# Patient Record
Sex: Male | Born: 1970
Health system: Southern US, Community
[De-identification: ages and names within clinical notes are randomized; demographics above are authoritative.]

## PROBLEM LIST (undated history)

## (undated) DIAGNOSIS — F32A Depression, unspecified: Secondary | ICD-10-CM

## (undated) DIAGNOSIS — A6000 Herpesviral infection of urogenital system, unspecified: Secondary | ICD-10-CM

## (undated) DIAGNOSIS — C4490 Unspecified malignant neoplasm of skin, unspecified: Secondary | ICD-10-CM

## (undated) HISTORY — DX: Depression, unspecified: F32.A

## (undated) HISTORY — DX: Herpesviral infection of urogenital system, unspecified: A60.00

## (undated) HISTORY — DX: Unspecified malignant neoplasm of skin, unspecified: C44.90

## (undated) MED FILL — Nivolumab IV Soln 240 MG/24ML: INTRAVENOUS | Qty: 48 | Status: AC

---

## 2001-07-07 DIAGNOSIS — Z87828 Personal history of other (healed) physical injury and trauma: Secondary | ICD-10-CM

## 2001-07-07 HISTORY — PX: SPINAL FUSION W/ LUQUE UNIT ROD: SHX2426

## 2001-07-07 HISTORY — DX: Personal history of other (healed) physical injury and trauma: Z87.828

## 2001-12-12 ENCOUNTER — Inpatient Hospital Stay (HOSPITAL_COMMUNITY): Admission: AD | Admit: 2001-12-12 | Discharge: 2001-12-18 | Payer: Self-pay

## 2001-12-13 ENCOUNTER — Encounter: Payer: Self-pay | Admitting: Surgery

## 2001-12-14 ENCOUNTER — Encounter: Payer: Self-pay | Admitting: Neurosurgery

## 2003-08-28 ENCOUNTER — Encounter: Admission: RE | Admit: 2003-08-28 | Discharge: 2003-08-28 | Payer: Self-pay | Admitting: Orthopaedic Surgery

## 2004-04-10 ENCOUNTER — Ambulatory Visit: Payer: Self-pay | Admitting: Physician Assistant

## 2004-04-24 ENCOUNTER — Ambulatory Visit: Payer: Self-pay | Admitting: Pain Medicine

## 2004-06-04 ENCOUNTER — Ambulatory Visit: Payer: Self-pay | Admitting: Physician Assistant

## 2004-06-27 ENCOUNTER — Ambulatory Visit: Payer: Self-pay | Admitting: Physician Assistant

## 2011-07-24 DIAGNOSIS — A6000 Herpesviral infection of urogenital system, unspecified: Secondary | ICD-10-CM | POA: Diagnosis not present

## 2011-07-24 DIAGNOSIS — M545 Low back pain: Secondary | ICD-10-CM | POA: Diagnosis not present

## 2011-07-24 DIAGNOSIS — Z6825 Body mass index (BMI) 25.0-25.9, adult: Secondary | ICD-10-CM | POA: Diagnosis not present

## 2011-07-24 DIAGNOSIS — Z79899 Other long term (current) drug therapy: Secondary | ICD-10-CM | POA: Diagnosis not present

## 2011-10-21 DIAGNOSIS — M545 Low back pain: Secondary | ICD-10-CM | POA: Diagnosis not present

## 2011-10-21 DIAGNOSIS — Z79899 Other long term (current) drug therapy: Secondary | ICD-10-CM | POA: Diagnosis not present

## 2011-10-21 DIAGNOSIS — D500 Iron deficiency anemia secondary to blood loss (chronic): Secondary | ICD-10-CM | POA: Diagnosis not present

## 2011-10-21 DIAGNOSIS — F489 Nonpsychotic mental disorder, unspecified: Secondary | ICD-10-CM | POA: Diagnosis not present

## 2011-10-21 DIAGNOSIS — E291 Testicular hypofunction: Secondary | ICD-10-CM | POA: Diagnosis not present

## 2012-01-15 DIAGNOSIS — A6000 Herpesviral infection of urogenital system, unspecified: Secondary | ICD-10-CM | POA: Diagnosis not present

## 2012-01-15 DIAGNOSIS — E291 Testicular hypofunction: Secondary | ICD-10-CM | POA: Diagnosis not present

## 2012-01-15 DIAGNOSIS — M545 Low back pain: Secondary | ICD-10-CM | POA: Diagnosis not present

## 2012-04-08 DIAGNOSIS — Z6825 Body mass index (BMI) 25.0-25.9, adult: Secondary | ICD-10-CM | POA: Diagnosis not present

## 2012-04-08 DIAGNOSIS — Z125 Encounter for screening for malignant neoplasm of prostate: Secondary | ICD-10-CM | POA: Diagnosis not present

## 2012-04-08 DIAGNOSIS — M545 Low back pain: Secondary | ICD-10-CM | POA: Diagnosis not present

## 2012-04-08 DIAGNOSIS — N529 Male erectile dysfunction, unspecified: Secondary | ICD-10-CM | POA: Diagnosis not present

## 2012-04-08 DIAGNOSIS — E291 Testicular hypofunction: Secondary | ICD-10-CM | POA: Diagnosis not present

## 2012-04-08 DIAGNOSIS — Z79899 Other long term (current) drug therapy: Secondary | ICD-10-CM | POA: Diagnosis not present

## 2012-05-13 DIAGNOSIS — E291 Testicular hypofunction: Secondary | ICD-10-CM | POA: Diagnosis not present

## 2012-07-14 DIAGNOSIS — M545 Low back pain: Secondary | ICD-10-CM | POA: Diagnosis not present

## 2012-07-14 DIAGNOSIS — J189 Pneumonia, unspecified organism: Secondary | ICD-10-CM | POA: Diagnosis not present

## 2012-07-14 DIAGNOSIS — B9789 Other viral agents as the cause of diseases classified elsewhere: Secondary | ICD-10-CM | POA: Diagnosis not present

## 2012-10-11 DIAGNOSIS — K649 Unspecified hemorrhoids: Secondary | ICD-10-CM | POA: Diagnosis not present

## 2012-10-11 DIAGNOSIS — M545 Low back pain: Secondary | ICD-10-CM | POA: Diagnosis not present

## 2012-12-30 DIAGNOSIS — IMO0002 Reserved for concepts with insufficient information to code with codable children: Secondary | ICD-10-CM | POA: Diagnosis not present

## 2012-12-30 DIAGNOSIS — M545 Low back pain: Secondary | ICD-10-CM | POA: Diagnosis not present

## 2012-12-30 DIAGNOSIS — F411 Generalized anxiety disorder: Secondary | ICD-10-CM | POA: Diagnosis not present

## 2013-04-07 DIAGNOSIS — A6000 Herpesviral infection of urogenital system, unspecified: Secondary | ICD-10-CM | POA: Diagnosis not present

## 2013-04-07 DIAGNOSIS — F411 Generalized anxiety disorder: Secondary | ICD-10-CM | POA: Diagnosis not present

## 2013-04-07 DIAGNOSIS — M545 Low back pain: Secondary | ICD-10-CM | POA: Diagnosis not present

## 2013-07-05 DIAGNOSIS — M545 Low back pain: Secondary | ICD-10-CM | POA: Diagnosis not present

## 2013-07-05 DIAGNOSIS — Z79899 Other long term (current) drug therapy: Secondary | ICD-10-CM | POA: Diagnosis not present

## 2013-10-04 DIAGNOSIS — M545 Low back pain, unspecified: Secondary | ICD-10-CM | POA: Diagnosis not present

## 2013-10-04 DIAGNOSIS — F411 Generalized anxiety disorder: Secondary | ICD-10-CM | POA: Diagnosis not present

## 2013-12-29 DIAGNOSIS — M545 Low back pain, unspecified: Secondary | ICD-10-CM | POA: Diagnosis not present

## 2013-12-29 DIAGNOSIS — Z0000 Encounter for general adult medical examination without abnormal findings: Secondary | ICD-10-CM | POA: Diagnosis not present

## 2013-12-29 DIAGNOSIS — Z79899 Other long term (current) drug therapy: Secondary | ICD-10-CM | POA: Diagnosis not present

## 2013-12-29 DIAGNOSIS — H539 Unspecified visual disturbance: Secondary | ICD-10-CM | POA: Diagnosis not present

## 2013-12-29 DIAGNOSIS — Z125 Encounter for screening for malignant neoplasm of prostate: Secondary | ICD-10-CM | POA: Diagnosis not present

## 2013-12-29 DIAGNOSIS — G43909 Migraine, unspecified, not intractable, without status migrainosus: Secondary | ICD-10-CM | POA: Diagnosis not present

## 2014-03-30 DIAGNOSIS — F411 Generalized anxiety disorder: Secondary | ICD-10-CM | POA: Diagnosis not present

## 2014-03-30 DIAGNOSIS — A6000 Herpesviral infection of urogenital system, unspecified: Secondary | ICD-10-CM | POA: Diagnosis not present

## 2014-03-30 DIAGNOSIS — M545 Low back pain, unspecified: Secondary | ICD-10-CM | POA: Diagnosis not present

## 2014-03-30 DIAGNOSIS — G43909 Migraine, unspecified, not intractable, without status migrainosus: Secondary | ICD-10-CM | POA: Diagnosis not present

## 2014-03-30 DIAGNOSIS — H547 Unspecified visual loss: Secondary | ICD-10-CM | POA: Diagnosis not present

## 2014-03-30 DIAGNOSIS — IMO0002 Reserved for concepts with insufficient information to code with codable children: Secondary | ICD-10-CM | POA: Diagnosis not present

## 2014-06-27 DIAGNOSIS — G43909 Migraine, unspecified, not intractable, without status migrainosus: Secondary | ICD-10-CM | POA: Diagnosis not present

## 2014-06-27 DIAGNOSIS — F419 Anxiety disorder, unspecified: Secondary | ICD-10-CM | POA: Diagnosis not present

## 2014-06-27 DIAGNOSIS — Z79899 Other long term (current) drug therapy: Secondary | ICD-10-CM | POA: Diagnosis not present

## 2014-06-27 DIAGNOSIS — M545 Low back pain: Secondary | ICD-10-CM | POA: Diagnosis not present

## 2014-09-26 DIAGNOSIS — Z6825 Body mass index (BMI) 25.0-25.9, adult: Secondary | ICD-10-CM | POA: Diagnosis not present

## 2014-09-26 DIAGNOSIS — F419 Anxiety disorder, unspecified: Secondary | ICD-10-CM | POA: Diagnosis not present

## 2014-09-26 DIAGNOSIS — G43909 Migraine, unspecified, not intractable, without status migrainosus: Secondary | ICD-10-CM | POA: Diagnosis not present

## 2014-09-26 DIAGNOSIS — K210 Gastro-esophageal reflux disease with esophagitis: Secondary | ICD-10-CM | POA: Diagnosis not present

## 2014-09-26 DIAGNOSIS — M545 Low back pain: Secondary | ICD-10-CM | POA: Diagnosis not present

## 2014-12-27 DIAGNOSIS — A6000 Herpesviral infection of urogenital system, unspecified: Secondary | ICD-10-CM | POA: Diagnosis not present

## 2014-12-27 DIAGNOSIS — Z79899 Other long term (current) drug therapy: Secondary | ICD-10-CM | POA: Diagnosis not present

## 2014-12-27 DIAGNOSIS — M545 Low back pain: Secondary | ICD-10-CM | POA: Diagnosis not present

## 2014-12-27 DIAGNOSIS — G43909 Migraine, unspecified, not intractable, without status migrainosus: Secondary | ICD-10-CM | POA: Diagnosis not present

## 2014-12-27 DIAGNOSIS — G4700 Insomnia, unspecified: Secondary | ICD-10-CM | POA: Diagnosis not present

## 2015-03-23 DIAGNOSIS — G43909 Migraine, unspecified, not intractable, without status migrainosus: Secondary | ICD-10-CM | POA: Diagnosis not present

## 2015-03-23 DIAGNOSIS — G4700 Insomnia, unspecified: Secondary | ICD-10-CM | POA: Diagnosis not present

## 2015-03-23 DIAGNOSIS — Z719 Counseling, unspecified: Secondary | ICD-10-CM | POA: Diagnosis not present

## 2015-03-23 DIAGNOSIS — M545 Low back pain: Secondary | ICD-10-CM | POA: Diagnosis not present

## 2015-06-22 DIAGNOSIS — Z6824 Body mass index (BMI) 24.0-24.9, adult: Secondary | ICD-10-CM | POA: Diagnosis not present

## 2015-06-22 DIAGNOSIS — M545 Low back pain: Secondary | ICD-10-CM | POA: Diagnosis not present

## 2015-06-22 DIAGNOSIS — Z79899 Other long term (current) drug therapy: Secondary | ICD-10-CM | POA: Diagnosis not present

## 2015-06-22 DIAGNOSIS — Z125 Encounter for screening for malignant neoplasm of prostate: Secondary | ICD-10-CM | POA: Diagnosis not present

## 2015-09-19 DIAGNOSIS — M545 Low back pain: Secondary | ICD-10-CM | POA: Diagnosis not present

## 2015-12-17 DIAGNOSIS — A6000 Herpesviral infection of urogenital system, unspecified: Secondary | ICD-10-CM | POA: Diagnosis not present

## 2015-12-17 DIAGNOSIS — Z79899 Other long term (current) drug therapy: Secondary | ICD-10-CM | POA: Diagnosis not present

## 2015-12-17 DIAGNOSIS — G43909 Migraine, unspecified, not intractable, without status migrainosus: Secondary | ICD-10-CM | POA: Diagnosis not present

## 2015-12-17 DIAGNOSIS — K210 Gastro-esophageal reflux disease with esophagitis: Secondary | ICD-10-CM | POA: Diagnosis not present

## 2015-12-17 DIAGNOSIS — M545 Low back pain: Secondary | ICD-10-CM | POA: Diagnosis not present

## 2015-12-17 DIAGNOSIS — R739 Hyperglycemia, unspecified: Secondary | ICD-10-CM | POA: Diagnosis not present

## 2015-12-17 DIAGNOSIS — Z6823 Body mass index (BMI) 23.0-23.9, adult: Secondary | ICD-10-CM | POA: Diagnosis not present

## 2016-03-14 DIAGNOSIS — G43909 Migraine, unspecified, not intractable, without status migrainosus: Secondary | ICD-10-CM | POA: Diagnosis not present

## 2016-03-14 DIAGNOSIS — A6000 Herpesviral infection of urogenital system, unspecified: Secondary | ICD-10-CM | POA: Diagnosis not present

## 2016-03-14 DIAGNOSIS — Z1389 Encounter for screening for other disorder: Secondary | ICD-10-CM | POA: Diagnosis not present

## 2016-03-14 DIAGNOSIS — M545 Low back pain: Secondary | ICD-10-CM | POA: Diagnosis not present

## 2016-06-17 DIAGNOSIS — A6002 Herpesviral infection of other male genital organs: Secondary | ICD-10-CM | POA: Diagnosis not present

## 2016-06-17 DIAGNOSIS — E663 Overweight: Secondary | ICD-10-CM | POA: Diagnosis not present

## 2016-06-17 DIAGNOSIS — M545 Low back pain: Secondary | ICD-10-CM | POA: Diagnosis not present

## 2016-06-17 DIAGNOSIS — Z79891 Long term (current) use of opiate analgesic: Secondary | ICD-10-CM | POA: Diagnosis not present

## 2016-06-17 DIAGNOSIS — Z6825 Body mass index (BMI) 25.0-25.9, adult: Secondary | ICD-10-CM | POA: Diagnosis not present

## 2016-09-15 DIAGNOSIS — K649 Unspecified hemorrhoids: Secondary | ICD-10-CM | POA: Diagnosis not present

## 2016-09-15 DIAGNOSIS — L989 Disorder of the skin and subcutaneous tissue, unspecified: Secondary | ICD-10-CM | POA: Diagnosis not present

## 2016-09-15 DIAGNOSIS — G43909 Migraine, unspecified, not intractable, without status migrainosus: Secondary | ICD-10-CM | POA: Diagnosis not present

## 2016-09-15 DIAGNOSIS — M545 Low back pain: Secondary | ICD-10-CM | POA: Diagnosis not present

## 2016-09-15 DIAGNOSIS — A6002 Herpesviral infection of other male genital organs: Secondary | ICD-10-CM | POA: Diagnosis not present

## 2016-09-15 DIAGNOSIS — Z6824 Body mass index (BMI) 24.0-24.9, adult: Secondary | ICD-10-CM | POA: Diagnosis not present

## 2016-12-15 DIAGNOSIS — M545 Low back pain: Secondary | ICD-10-CM | POA: Diagnosis not present

## 2016-12-15 DIAGNOSIS — G43909 Migraine, unspecified, not intractable, without status migrainosus: Secondary | ICD-10-CM | POA: Diagnosis not present

## 2016-12-15 DIAGNOSIS — A6002 Herpesviral infection of other male genital organs: Secondary | ICD-10-CM | POA: Diagnosis not present

## 2016-12-15 DIAGNOSIS — Z6825 Body mass index (BMI) 25.0-25.9, adult: Secondary | ICD-10-CM | POA: Diagnosis not present

## 2017-03-16 DIAGNOSIS — F329 Major depressive disorder, single episode, unspecified: Secondary | ICD-10-CM | POA: Diagnosis not present

## 2017-03-16 DIAGNOSIS — Z79899 Other long term (current) drug therapy: Secondary | ICD-10-CM | POA: Diagnosis not present

## 2017-03-16 DIAGNOSIS — Z79891 Long term (current) use of opiate analgesic: Secondary | ICD-10-CM | POA: Diagnosis not present

## 2017-03-16 DIAGNOSIS — Z9181 History of falling: Secondary | ICD-10-CM | POA: Diagnosis not present

## 2017-03-16 DIAGNOSIS — M818 Other osteoporosis without current pathological fracture: Secondary | ICD-10-CM | POA: Diagnosis not present

## 2017-03-16 DIAGNOSIS — E663 Overweight: Secondary | ICD-10-CM | POA: Diagnosis not present

## 2017-03-16 DIAGNOSIS — Z1389 Encounter for screening for other disorder: Secondary | ICD-10-CM | POA: Diagnosis not present

## 2017-03-16 DIAGNOSIS — Z125 Encounter for screening for malignant neoplasm of prostate: Secondary | ICD-10-CM | POA: Diagnosis not present

## 2017-03-16 DIAGNOSIS — M545 Low back pain: Secondary | ICD-10-CM | POA: Diagnosis not present

## 2017-03-16 DIAGNOSIS — Z6825 Body mass index (BMI) 25.0-25.9, adult: Secondary | ICD-10-CM | POA: Diagnosis not present

## 2017-04-15 DIAGNOSIS — H60391 Other infective otitis externa, right ear: Secondary | ICD-10-CM | POA: Diagnosis not present

## 2017-04-15 DIAGNOSIS — F329 Major depressive disorder, single episode, unspecified: Secondary | ICD-10-CM | POA: Diagnosis not present

## 2017-04-15 DIAGNOSIS — Z6825 Body mass index (BMI) 25.0-25.9, adult: Secondary | ICD-10-CM | POA: Diagnosis not present

## 2017-06-12 DIAGNOSIS — M545 Low back pain: Secondary | ICD-10-CM | POA: Diagnosis not present

## 2017-06-12 DIAGNOSIS — Z6825 Body mass index (BMI) 25.0-25.9, adult: Secondary | ICD-10-CM | POA: Diagnosis not present

## 2017-09-10 DIAGNOSIS — A6002 Herpesviral infection of other male genital organs: Secondary | ICD-10-CM | POA: Diagnosis not present

## 2017-09-10 DIAGNOSIS — G43909 Migraine, unspecified, not intractable, without status migrainosus: Secondary | ICD-10-CM | POA: Diagnosis not present

## 2017-09-10 DIAGNOSIS — Z6824 Body mass index (BMI) 24.0-24.9, adult: Secondary | ICD-10-CM | POA: Diagnosis not present

## 2017-09-10 DIAGNOSIS — M545 Low back pain: Secondary | ICD-10-CM | POA: Diagnosis not present

## 2017-12-10 DIAGNOSIS — G43909 Migraine, unspecified, not intractable, without status migrainosus: Secondary | ICD-10-CM | POA: Diagnosis not present

## 2017-12-10 DIAGNOSIS — E559 Vitamin D deficiency, unspecified: Secondary | ICD-10-CM | POA: Diagnosis not present

## 2017-12-10 DIAGNOSIS — Z6822 Body mass index (BMI) 22.0-22.9, adult: Secondary | ICD-10-CM | POA: Diagnosis not present

## 2017-12-10 DIAGNOSIS — M545 Low back pain: Secondary | ICD-10-CM | POA: Diagnosis not present

## 2018-01-28 DIAGNOSIS — H2513 Age-related nuclear cataract, bilateral: Secondary | ICD-10-CM | POA: Diagnosis not present

## 2018-03-11 DIAGNOSIS — Z79899 Other long term (current) drug therapy: Secondary | ICD-10-CM | POA: Diagnosis not present

## 2018-03-11 DIAGNOSIS — N62 Hypertrophy of breast: Secondary | ICD-10-CM | POA: Diagnosis not present

## 2018-03-11 DIAGNOSIS — E559 Vitamin D deficiency, unspecified: Secondary | ICD-10-CM | POA: Diagnosis not present

## 2018-03-11 DIAGNOSIS — N6320 Unspecified lump in the left breast, unspecified quadrant: Secondary | ICD-10-CM | POA: Diagnosis not present

## 2018-03-11 DIAGNOSIS — G43909 Migraine, unspecified, not intractable, without status migrainosus: Secondary | ICD-10-CM | POA: Diagnosis not present

## 2018-03-18 DIAGNOSIS — N62 Hypertrophy of breast: Secondary | ICD-10-CM | POA: Diagnosis not present

## 2018-03-18 DIAGNOSIS — R928 Other abnormal and inconclusive findings on diagnostic imaging of breast: Secondary | ICD-10-CM | POA: Diagnosis not present

## 2018-03-24 DIAGNOSIS — N62 Hypertrophy of breast: Secondary | ICD-10-CM | POA: Diagnosis not present

## 2018-03-24 DIAGNOSIS — E349 Endocrine disorder, unspecified: Secondary | ICD-10-CM | POA: Diagnosis not present

## 2018-03-24 DIAGNOSIS — Z23 Encounter for immunization: Secondary | ICD-10-CM | POA: Diagnosis not present

## 2018-03-24 DIAGNOSIS — Z125 Encounter for screening for malignant neoplasm of prostate: Secondary | ICD-10-CM | POA: Diagnosis not present

## 2018-06-23 DIAGNOSIS — M545 Low back pain: Secondary | ICD-10-CM | POA: Diagnosis not present

## 2018-06-23 DIAGNOSIS — E559 Vitamin D deficiency, unspecified: Secondary | ICD-10-CM | POA: Diagnosis not present

## 2018-06-23 DIAGNOSIS — G4700 Insomnia, unspecified: Secondary | ICD-10-CM | POA: Diagnosis not present

## 2018-06-23 DIAGNOSIS — A6002 Herpesviral infection of other male genital organs: Secondary | ICD-10-CM | POA: Diagnosis not present

## 2019-07-08 HISTORY — PX: MELANOMA EXCISION WITH SENTINEL LYMPH NODE BIOPSY: SHX5267

## 2019-07-29 DIAGNOSIS — K649 Unspecified hemorrhoids: Secondary | ICD-10-CM | POA: Diagnosis not present

## 2019-07-29 DIAGNOSIS — G4700 Insomnia, unspecified: Secondary | ICD-10-CM | POA: Diagnosis not present

## 2019-07-29 DIAGNOSIS — A6002 Herpesviral infection of other male genital organs: Secondary | ICD-10-CM | POA: Diagnosis not present

## 2019-07-29 DIAGNOSIS — E559 Vitamin D deficiency, unspecified: Secondary | ICD-10-CM | POA: Diagnosis not present

## 2019-11-16 DIAGNOSIS — Z6822 Body mass index (BMI) 22.0-22.9, adult: Secondary | ICD-10-CM | POA: Diagnosis not present

## 2019-11-16 DIAGNOSIS — L989 Disorder of the skin and subcutaneous tissue, unspecified: Secondary | ICD-10-CM | POA: Diagnosis not present

## 2019-11-16 DIAGNOSIS — M7591 Shoulder lesion, unspecified, right shoulder: Secondary | ICD-10-CM | POA: Diagnosis not present

## 2019-12-01 DIAGNOSIS — L578 Other skin changes due to chronic exposure to nonionizing radiation: Secondary | ICD-10-CM | POA: Diagnosis not present

## 2019-12-01 DIAGNOSIS — C439 Malignant melanoma of skin, unspecified: Secondary | ICD-10-CM

## 2019-12-01 DIAGNOSIS — L820 Inflamed seborrheic keratosis: Secondary | ICD-10-CM | POA: Diagnosis not present

## 2019-12-01 DIAGNOSIS — C4361 Malignant melanoma of right upper limb, including shoulder: Secondary | ICD-10-CM | POA: Diagnosis not present

## 2019-12-01 DIAGNOSIS — C4359 Malignant melanoma of other part of trunk: Secondary | ICD-10-CM | POA: Diagnosis not present

## 2019-12-01 DIAGNOSIS — L821 Other seborrheic keratosis: Secondary | ICD-10-CM | POA: Diagnosis not present

## 2019-12-01 DIAGNOSIS — D225 Melanocytic nevi of trunk: Secondary | ICD-10-CM | POA: Diagnosis not present

## 2019-12-01 DIAGNOSIS — D485 Neoplasm of uncertain behavior of skin: Secondary | ICD-10-CM | POA: Diagnosis not present

## 2019-12-01 HISTORY — DX: Malignant melanoma of skin, unspecified: C43.9

## 2019-12-14 DIAGNOSIS — C4361 Malignant melanoma of right upper limb, including shoulder: Secondary | ICD-10-CM | POA: Diagnosis not present

## 2019-12-14 DIAGNOSIS — D225 Melanocytic nevi of trunk: Secondary | ICD-10-CM | POA: Diagnosis not present

## 2019-12-21 DIAGNOSIS — C4361 Malignant melanoma of right upper limb, including shoulder: Secondary | ICD-10-CM | POA: Diagnosis not present

## 2019-12-23 DIAGNOSIS — Z20822 Contact with and (suspected) exposure to covid-19: Secondary | ICD-10-CM | POA: Diagnosis not present

## 2019-12-23 DIAGNOSIS — C4361 Malignant melanoma of right upper limb, including shoulder: Secondary | ICD-10-CM | POA: Diagnosis not present

## 2019-12-23 DIAGNOSIS — C439 Malignant melanoma of skin, unspecified: Secondary | ICD-10-CM | POA: Diagnosis not present

## 2019-12-23 DIAGNOSIS — Z01812 Encounter for preprocedural laboratory examination: Secondary | ICD-10-CM | POA: Diagnosis not present

## 2019-12-28 DIAGNOSIS — J984 Other disorders of lung: Secondary | ICD-10-CM | POA: Diagnosis not present

## 2019-12-28 DIAGNOSIS — C4361 Malignant melanoma of right upper limb, including shoulder: Secondary | ICD-10-CM | POA: Diagnosis not present

## 2019-12-30 DIAGNOSIS — Z87891 Personal history of nicotine dependence: Secondary | ICD-10-CM | POA: Diagnosis not present

## 2019-12-30 DIAGNOSIS — R590 Localized enlarged lymph nodes: Secondary | ICD-10-CM | POA: Diagnosis not present

## 2019-12-30 DIAGNOSIS — Z79899 Other long term (current) drug therapy: Secondary | ICD-10-CM | POA: Diagnosis not present

## 2019-12-30 DIAGNOSIS — C4361 Malignant melanoma of right upper limb, including shoulder: Secondary | ICD-10-CM | POA: Diagnosis not present

## 2019-12-30 DIAGNOSIS — J302 Other seasonal allergic rhinitis: Secondary | ICD-10-CM | POA: Diagnosis not present

## 2019-12-30 DIAGNOSIS — D225 Melanocytic nevi of trunk: Secondary | ICD-10-CM | POA: Diagnosis not present

## 2019-12-30 DIAGNOSIS — L905 Scar conditions and fibrosis of skin: Secondary | ICD-10-CM | POA: Diagnosis not present

## 2019-12-30 DIAGNOSIS — C4359 Malignant melanoma of other part of trunk: Secondary | ICD-10-CM | POA: Diagnosis not present

## 2020-01-04 DIAGNOSIS — C4361 Malignant melanoma of right upper limb, including shoulder: Secondary | ICD-10-CM | POA: Diagnosis not present

## 2020-01-16 DIAGNOSIS — Z01812 Encounter for preprocedural laboratory examination: Secondary | ICD-10-CM | POA: Diagnosis not present

## 2020-01-16 DIAGNOSIS — Z20822 Contact with and (suspected) exposure to covid-19: Secondary | ICD-10-CM | POA: Diagnosis not present

## 2020-01-16 DIAGNOSIS — C4361 Malignant melanoma of right upper limb, including shoulder: Secondary | ICD-10-CM | POA: Diagnosis not present

## 2020-01-19 DIAGNOSIS — H2512 Age-related nuclear cataract, left eye: Secondary | ICD-10-CM | POA: Diagnosis not present

## 2020-01-20 DIAGNOSIS — Z87891 Personal history of nicotine dependence: Secondary | ICD-10-CM | POA: Diagnosis not present

## 2020-01-20 DIAGNOSIS — J302 Other seasonal allergic rhinitis: Secondary | ICD-10-CM | POA: Diagnosis not present

## 2020-01-20 DIAGNOSIS — Z87828 Personal history of other (healed) physical injury and trauma: Secondary | ICD-10-CM | POA: Diagnosis not present

## 2020-01-20 DIAGNOSIS — L988 Other specified disorders of the skin and subcutaneous tissue: Secondary | ICD-10-CM | POA: Diagnosis not present

## 2020-01-20 DIAGNOSIS — C4361 Malignant melanoma of right upper limb, including shoulder: Secondary | ICD-10-CM | POA: Diagnosis not present

## 2020-01-20 DIAGNOSIS — Z981 Arthrodesis status: Secondary | ICD-10-CM | POA: Diagnosis not present

## 2020-01-20 DIAGNOSIS — Z79899 Other long term (current) drug therapy: Secondary | ICD-10-CM | POA: Diagnosis not present

## 2020-02-13 DIAGNOSIS — H2512 Age-related nuclear cataract, left eye: Secondary | ICD-10-CM | POA: Diagnosis not present

## 2020-02-27 DIAGNOSIS — H2512 Age-related nuclear cataract, left eye: Secondary | ICD-10-CM | POA: Diagnosis not present

## 2020-03-19 DIAGNOSIS — C4361 Malignant melanoma of right upper limb, including shoulder: Secondary | ICD-10-CM | POA: Diagnosis not present

## 2020-04-03 DIAGNOSIS — L578 Other skin changes due to chronic exposure to nonionizing radiation: Secondary | ICD-10-CM | POA: Diagnosis not present

## 2020-04-03 DIAGNOSIS — D225 Melanocytic nevi of trunk: Secondary | ICD-10-CM | POA: Diagnosis not present

## 2020-04-03 DIAGNOSIS — Z85820 Personal history of malignant melanoma of skin: Secondary | ICD-10-CM | POA: Diagnosis not present

## 2020-04-03 DIAGNOSIS — D485 Neoplasm of uncertain behavior of skin: Secondary | ICD-10-CM | POA: Diagnosis not present

## 2020-04-20 DIAGNOSIS — D485 Neoplasm of uncertain behavior of skin: Secondary | ICD-10-CM | POA: Diagnosis not present
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
## 2020-05-30 ENCOUNTER — Other Ambulatory Visit: Payer: Self-pay | Admitting: Oncology

## 2020-05-30 ENCOUNTER — Inpatient Hospital Stay: Payer: Medicare Other | Attending: Oncology | Admitting: Hematology and Oncology

## 2020-05-30 ENCOUNTER — Encounter: Payer: Self-pay | Admitting: Oncology

## 2020-05-30 ENCOUNTER — Other Ambulatory Visit: Payer: Self-pay

## 2020-05-30 ENCOUNTER — Inpatient Hospital Stay (INDEPENDENT_AMBULATORY_CARE_PROVIDER_SITE_OTHER): Payer: Medicare Other | Admitting: Oncology

## 2020-05-30 VITALS — BP 130/85 | HR 94 | Temp 98.1°F | Resp 16 | Ht 68.5 in | Wt 160.3 lb

## 2020-05-30 DIAGNOSIS — C439 Malignant melanoma of skin, unspecified: Secondary | ICD-10-CM | POA: Diagnosis not present

## 2020-05-30 DIAGNOSIS — D239 Other benign neoplasm of skin, unspecified: Secondary | ICD-10-CM

## 2020-05-30 DIAGNOSIS — Z0001 Encounter for general adult medical examination with abnormal findings: Secondary | ICD-10-CM | POA: Diagnosis not present

## 2020-05-30 DIAGNOSIS — D649 Anemia, unspecified: Secondary | ICD-10-CM | POA: Diagnosis not present

## 2020-05-30 HISTORY — DX: Other benign neoplasm of skin, unspecified: D23.9

## 2020-05-30 LAB — CBC AND DIFFERENTIAL
HCT: 42 (ref 41–53)
Hemoglobin: 14.1 (ref 13.5–17.5)
Neutrophils Absolute: 3.15
Platelets: 330 (ref 150–399)
WBC: 5

## 2020-05-30 LAB — BASIC METABOLIC PANEL
BUN: 17 (ref 4–21)
CO2: 29 — AB (ref 13–22)
Chloride: 104 (ref 99–108)
Creatinine: 0.9 (ref 0.6–1.3)
Glucose: 105
Potassium: 4.4 (ref 3.4–5.3)
Sodium: 140 (ref 137–147)

## 2020-05-30 LAB — HEPATIC FUNCTION PANEL
ALT: 12 (ref 10–40)
AST: 26 (ref 14–40)
Alkaline Phosphatase: 64 (ref 25–125)
Bilirubin, Total: 0.4

## 2020-05-30 LAB — COMPREHENSIVE METABOLIC PANEL
Albumin: 4.5 (ref 3.5–5.0)
Calcium: 9.8 (ref 8.7–10.7)

## 2020-05-30 LAB — CBC: RBC: 4.31 (ref 3.87–5.11)

## 2020-06-04 ENCOUNTER — Telehealth: Payer: Self-pay

## 2020-06-04 NOTE — Telephone Encounter (Signed)
Referral sent to Dr. Carlos Levering office.

## 2020-06-04 NOTE — Telephone Encounter (Signed)
-----   Message from Derwood Kaplan, MD sent at 05/30/2020  8:02 PM EST ----- Regarding: referral Pls ref. Dr. Coralie Keens for suspicious area of melanoma scar right shoulder, he has seen before

## 2020-06-07 DIAGNOSIS — L910 Hypertrophic scar: Secondary | ICD-10-CM | POA: Diagnosis not present

## 2020-06-07 DIAGNOSIS — Z85828 Personal history of other malignant neoplasm of skin: Secondary | ICD-10-CM | POA: Diagnosis not present

## 2020-08-07 DIAGNOSIS — L820 Inflamed seborrheic keratosis: Secondary | ICD-10-CM | POA: Diagnosis not present

## 2020-08-07 DIAGNOSIS — D485 Neoplasm of uncertain behavior of skin: Secondary | ICD-10-CM | POA: Diagnosis not present

## 2020-08-07 DIAGNOSIS — Z85820 Personal history of malignant melanoma of skin: Secondary | ICD-10-CM | POA: Diagnosis not present

## 2020-08-07 DIAGNOSIS — D225 Melanocytic nevi of trunk: Secondary | ICD-10-CM | POA: Diagnosis not present

## 2020-12-06 DIAGNOSIS — Z85820 Personal history of malignant melanoma of skin: Secondary | ICD-10-CM | POA: Diagnosis not present

## 2020-12-06 DIAGNOSIS — L821 Other seborrheic keratosis: Secondary | ICD-10-CM | POA: Diagnosis not present

## 2020-12-06 DIAGNOSIS — D485 Neoplasm of uncertain behavior of skin: Secondary | ICD-10-CM | POA: Diagnosis not present

## 2020-12-06 DIAGNOSIS — L578 Other skin changes due to chronic exposure to nonionizing radiation: Secondary | ICD-10-CM | POA: Diagnosis not present

## 2020-12-06 DIAGNOSIS — L820 Inflamed seborrheic keratosis: Secondary | ICD-10-CM | POA: Diagnosis not present

## 2020-12-26 DIAGNOSIS — H2511 Age-related nuclear cataract, right eye: Secondary | ICD-10-CM | POA: Diagnosis not present

## 2020-12-26 DIAGNOSIS — H04123 Dry eye syndrome of bilateral lacrimal glands: Secondary | ICD-10-CM | POA: Diagnosis not present

## 2020-12-26 DIAGNOSIS — Z01818 Encounter for other preprocedural examination: Secondary | ICD-10-CM | POA: Diagnosis not present

## 2020-12-31 ENCOUNTER — Other Ambulatory Visit: Payer: Self-pay | Admitting: Hematology and Oncology

## 2020-12-31 ENCOUNTER — Inpatient Hospital Stay: Payer: Medicare Other

## 2020-12-31 ENCOUNTER — Inpatient Hospital Stay: Payer: Medicare Other | Attending: Hematology and Oncology | Admitting: Hematology and Oncology

## 2020-12-31 ENCOUNTER — Other Ambulatory Visit: Payer: Self-pay

## 2020-12-31 ENCOUNTER — Encounter: Payer: Self-pay | Admitting: Hematology and Oncology

## 2020-12-31 VITALS — BP 137/84 | HR 85 | Temp 98.5°F | Resp 16 | Ht 68.5 in | Wt 160.6 lb

## 2020-12-31 DIAGNOSIS — C439 Malignant melanoma of skin, unspecified: Secondary | ICD-10-CM

## 2020-12-31 DIAGNOSIS — Z85820 Personal history of malignant melanoma of skin: Secondary | ICD-10-CM | POA: Diagnosis not present

## 2020-12-31 LAB — CBC AND DIFFERENTIAL
HCT: 41 (ref 41–53)
Hemoglobin: 13.9 (ref 13.5–17.5)
Neutrophils Absolute: 4.01
Platelets: 296 (ref 150–399)
WBC: 5.9

## 2020-12-31 LAB — HEPATIC FUNCTION PANEL
ALT: 12 (ref 10–40)
AST: 23 (ref 14–40)
Alkaline Phosphatase: 67 (ref 25–125)
Bilirubin, Total: 0.2

## 2020-12-31 LAB — BASIC METABOLIC PANEL
BUN: 15 (ref 4–21)
CO2: 30 — AB (ref 13–22)
Chloride: 102 (ref 99–108)
Creatinine: 0.8 (ref 0.6–1.3)
Glucose: 96
Potassium: 4.2 (ref 3.4–5.3)
Sodium: 138 (ref 137–147)

## 2020-12-31 LAB — CBC: RBC: 4.3 (ref 3.87–5.11)

## 2020-12-31 LAB — COMPREHENSIVE METABOLIC PANEL
Albumin: 4.3 (ref 3.5–5.0)
Calcium: 9.3 (ref 8.7–10.7)

## 2020-12-31 NOTE — Progress Notes (Addendum)
[facility]  [address] [place],  [place]  [address] [phone]  Clinic Day:  12/31/2020  Referring physician: Penelope Coop, FNP   CHIEF COMPLAINT:  CC:  Stage IIB malignant melanoma of the right shoulder  Current Treatment:   Observation   HISTORY OF PRESENT ILLNESS:  Xavier Jones is a 50 y.o. male with stage IIB (T3b N0 M0) malignant melanoma of the right posterior shoulder diagnosed in May 2021.  We began seeing him in June.  He reported an enlarging nevus of the right shoulder for about a year.  It began to bleed, so he saw his dermatologist.  Biopsy revealed malignant melanoma, Breslow's depth 3.3 mm, Clark level IV, however, there was no overt attachment to the overlying epidermis, so a metastatic nodule could not be ruled out.  He had several other lesions removed at the time of biopsy.  Right lower lateral back shave revealed dysplastic compound nevus with severe atypia, limited margins free.  Right lower medial back shave revealed dysplastic nevus with moderate to severe atypia, peripheral margin involved.  Left mid buttocks shave revealed dysplastic compound nevus with moderate atypia, limited margins free.  MRI head and CT chest, abdomen and pelvis in June were negative for metastatic disease. He underwent wide local excision of the right shoulder melanoma with Dr. Coralie Keens in June.  Pathology revealed residual dermal malignant melanoma, but margins were clear.  3 sentinel lymph nodes were negative for metastasis (0/3).  However, there was a question as to whether this represents a metastatic lesion, rather than a primary, based on the presence of 2 nodules and no epidermal involvement.  He also had excision of the lower back lesion, which did not reveal malignancy. No adjuvant therapy was recommended. PET scan in September did not reveal any findings of hypermetabolic metastatic disease, there was low level activity felt to be postoperative  changes in the skin/subcutaneous tissue of the right posterior shoulder, as well as likely degenerative hypermetabolism of the proximal, posterior right femur.  At his visit in November, he had persistent bleeding of the right shoulder incision with fibrosis of the skin, so he was referred back to Dr. Coralie Keens for a biopsy.  This revealed hypertrophic type scar of the skin of the right shoulder.    INTERVAL HISTORY:  Xavier Jones is here today for repeat clinical assessment. He continues to follow with a dermatologist regularly and did have additional skin lesions removed earlier this year, which were not melanoma or pre cancer.  He denies concerning skin lesions  He states he has a small palpable node in the left upper neck for few weeks since having a toothache on that side.  He states the toothache has resolved. His only other complaint today is constant fatigue.  He does have difficulty sleeping, for which he is on amitriptyline. He denies fevers or chills. He denies pain. His appetite is good. His weight has been stable.  We had recommended chest x-ray prior to his visit today, but this was not done.  Review of his family history reveals a maternal aunt had breast cancer in her 34's and a maternal cousin had breast cancer in her 23's.  His maternal grandfather had stomach cancer in his 19s.  REVIEW OF SYSTEMS:  Review of Systems  Constitutional:  Positive for fatigue. Negative for appetite change, chills, fever and unexpected weight change.  HENT:   Negative for lump/mass, mouth sores and sore throat.   Respiratory:  Negative  for cough and shortness of breath.   Cardiovascular:  Negative for chest pain and leg swelling.  Gastrointestinal:  Negative for abdominal pain, constipation, diarrhea, nausea and vomiting.  Genitourinary:  Negative for difficulty urinating, dysuria, frequency and hematuria.   Musculoskeletal:  Negative for arthralgias, back pain and myalgias.  Skin:  Negative for itching, rash and  wound.  Neurological:  Negative for dizziness, extremity weakness, headaches, light-headedness and numbness.  Hematological:  Negative for adenopathy.  Psychiatric/Behavioral:  Positive for sleep disturbance (On medication). Negative for depression. The patient is not nervous/anxious.     VITALS:  Blood pressure 137/84, pulse 85, temperature 98.5 F (36.9 C), temperature source Oral, resp. rate 16, height 5' 8.5" (1.74 m), weight 160 lb 9.6 oz (72.8 kg), SpO2 100 %.  Wt Readings from Last 3 Encounters:  12/31/20 160 lb 9.6 oz (72.8 kg)  05/30/20 160 lb 4.8 oz (72.7 kg)    Body mass index is 24.06 kg/m.  Performance status (ECOG): 1 - Symptomatic but completely ambulatory  PHYSICAL EXAM:  Physical Exam Vitals and nursing note reviewed.  Constitutional:      General: He is not in acute distress.    Appearance: Normal appearance. He is normal weight.  HENT:     Head: Normocephalic and atraumatic.     Mouth/Throat:     Mouth: Mucous membranes are moist.     Pharynx: Oropharynx is clear. No oropharyngeal exudate or posterior oropharyngeal erythema.  Eyes:     General: No scleral icterus.    Extraocular Movements: Extraocular movements intact.     Conjunctiva/sclera: Conjunctivae normal.     Pupils: Pupils are equal, round, and reactive to light.  Cardiovascular:     Rate and Rhythm: Normal rate and regular rhythm.     Heart sounds: Normal heart sounds. No murmur heard.   No friction rub. No gallop.  Pulmonary:     Effort: Pulmonary effort is normal.     Breath sounds: Normal breath sounds. No wheezing, rhonchi or rales.  Chest:  Breasts:    Right: No axillary adenopathy or supraclavicular adenopathy.     Left: No axillary adenopathy or supraclavicular adenopathy.  Abdominal:     General: Bowel sounds are normal. There is no distension.     Palpations: Abdomen is soft. There is no hepatomegaly, splenomegaly or mass.     Tenderness: There is no abdominal tenderness.   Musculoskeletal:        General: Normal range of motion.     Cervical back: Normal range of motion and neck supple. No tenderness.     Right lower leg: No edema.     Left lower leg: No edema.  Lymphadenopathy:     Head:     Left side of head: Submental (approx 1cm, rubbery lymph node) adenopathy present.     Upper Body:     Right upper body: No supraclavicular or axillary adenopathy.     Left upper body: No supraclavicular or axillary adenopathy.     Lower Body: No right inguinal adenopathy. No left inguinal adenopathy.  Skin:    General: Skin is warm and dry.     Coloration: Skin is not jaundiced.     Findings: No rash.  Neurological:     Mental Status: He is alert and oriented to person, place, and time.     Cranial Nerves: No cranial nerve deficit.  Psychiatric:        Mood and Affect: Mood normal.  Behavior: Behavior normal.        Thought Content: Thought content normal.    LABS:   CBC Latest Ref Rng & Units 12/31/2020 05/30/2020  WBC - 5.9 5.0  Hemoglobin 13.5 - 17.5 13.9 14.1  Hematocrit 41 - 53 41 42  Platelets 150 - 399 296 330   CMP Latest Ref Rng & Units 12/31/2020 05/30/2020  BUN 4 - 21 15 17   Creatinine 0.6 - 1.3 0.8 0.9  Sodium 137 - 147 138 140  Potassium 3.4 - 5.3 4.2 4.4  Chloride 99 - 108 102 104  CO2 13 - 22 30(A) 29(A)  Calcium 8.7 - 10.7 9.3 9.8  Alkaline Phos 25 - 125 67 64  AST 14 - 40 23 26  ALT 10 - 40 12 12     No results found for: CEA1 / No results found for: CEA1 No results found for: PSA1 No results found for: INO676 No results found for: HMC947  No results found for: TOTALPROTELP, ALBUMINELP, A1GS, A2GS, BETS, BETA2SER, GAMS, MSPIKE, SPEI No results found for: TIBC, FERRITIN, IRONPCTSAT No results found for: LDH  STUDIES:  No results found.   HISTORY:   Past Medical History:  Diagnosis Date   Depression    mild   Genital herpes    History of back injury 2003   Skin cancer    melanoma    Past Surgical History:   Procedure Laterality Date   SPINAL FUSION W/ LUQUE UNIT ROD  2003    Family History  Problem Relation Age of Onset   Prostate cancer Father 11    Social History:  reports that he has never smoked. He has never used smokeless tobacco. He reports previous alcohol use. He reports previous drug use.The patient is alone today.  Allergies:  Allergies  Allergen Reactions   Sulfa Antibiotics    Testosterone     Current Medications: Current Outpatient Medications  Medication Sig Dispense Refill   acyclovir (ZOVIRAX) 400 MG tablet Take 400 mg by mouth 5 (five) times daily.     amitriptyline (ELAVIL) 25 MG tablet Take 25 mg by mouth at bedtime.     ergocalciferol (VITAMIN D2) 1.25 MG (50000 UT) capsule Take 50,000 Units by mouth once a week.     No current facility-administered medications for this visit.     ASSESSMENT & PLAN:   Assessment:    1. Stage IIB malignant melanoma of the right posterior shoulder, Breslow's depth 3.3 mm, Clark level IV, treated with wide local excision.  There wasstill a question as to whether this represents a metastatic lesion, rather than a primary, based on the presence of 2 nodules and no epidermal involvement.  MRI head and CT chest, abdomen and pelvis in June were negative for metastatic disease.   No adjuvant therapy was recommended.  PET scan in November was negative for metastatic disease.  He remains without obvious evidence of recurrence.   2.  Multiple dysplastic nevi of the right lower back with severe atypia.  The nevus of the medial back had positive peripheral margins, so he underwent re-excision of this area as well which only revealed dermal scar.   3.  Dysplastic compound nevus of the left mid buttock with moderate atypia.  Margins were free of neoplasm.   4.  Small scaling lesion in the center of the excision scar, biopsy of which revealed hypertrophic scar. This has resolved.  5.  Tiny benign appearing lymph node in the left submental  area  likely due to recent dental infection.  I will plan to see him back in 1 month to re-evaluate this.  6.  Fatigue which may be related to difficulty sleeping and/ or nortriptyline.  I asked him to discuss this with his primary care physician.  7.  Maternal family history of breast cancer in addition to his melanoma history which raises suspicion for possible BRCA mutation.  I will have him meet with genetic counselors for potential testing for hereditary cancer syndromes.    Plan:    I will set him up for his chest x-ray today.  We will plan to see him back in 1 month to re-evaluate the left submental lymph node. The patient understands the plans discussed today and is in agreement with them.  He knows to contact our office if he develops concerns prior to his next appointment.    [name], PA-C      Addendum:  Chest x-ray did not reveal any evidence of malignancy.

## 2021-01-02 ENCOUNTER — Encounter: Payer: Self-pay | Admitting: Hematology and Oncology

## 2021-01-02 DIAGNOSIS — H52211 Irregular astigmatism, right eye: Secondary | ICD-10-CM | POA: Diagnosis not present

## 2021-01-02 DIAGNOSIS — H2511 Age-related nuclear cataract, right eye: Secondary | ICD-10-CM | POA: Diagnosis not present

## 2021-01-22 ENCOUNTER — Inpatient Hospital Stay: Payer: Medicare Other | Attending: Hematology and Oncology | Admitting: Genetic Counselor

## 2021-01-22 ENCOUNTER — Other Ambulatory Visit: Payer: Self-pay | Admitting: Genetic Counselor

## 2021-01-22 ENCOUNTER — Other Ambulatory Visit: Payer: Self-pay

## 2021-01-22 ENCOUNTER — Encounter: Payer: Self-pay | Admitting: Genetic Counselor

## 2021-01-22 DIAGNOSIS — Z8042 Family history of malignant neoplasm of prostate: Secondary | ICD-10-CM

## 2021-01-22 DIAGNOSIS — Z803 Family history of malignant neoplasm of breast: Secondary | ICD-10-CM

## 2021-01-22 DIAGNOSIS — C439 Malignant melanoma of skin, unspecified: Secondary | ICD-10-CM

## 2021-01-22 HISTORY — DX: Family history of malignant neoplasm of prostate: Z80.42

## 2021-01-22 HISTORY — DX: Family history of malignant neoplasm of breast: Z80.3

## 2021-01-22 NOTE — Progress Notes (Signed)
REFERRING PROVIDER: [name], PA-C [address] [place],  [place] [address]  PRIMARY PROVIDER:  Penelope Coop, FNP  PRIMARY REASON FOR VISIT:  1. Melanoma of skin ([place])   2. Family history of breast cancer   3. Family history of prostate cancer    I connected with Xavier Jones on 01/22/2021 at 2pm EDT by [place] video conference and verified that I am speaking with the correct person using two identifiers.   Patient location: Office Provider location: [facility]  HISTORY OF PRESENT ILLNESS:   Xavier Jones, a 50 y.o. male, was seen for a [facility] cancer genetics consultation at the request of Rosanne Sack, PA-C due to a personal and family history of cancer.  Xavier Jones presents to clinic today to discuss the possibility of a hereditary predisposition to cancer, to discuss genetic testing, and to further clarify his future cancer risks, as well as potential cancer risks for family members.   In May 2021, at the age of 53, Xavier Jones was diagnosed with melanoma of the right posterior shoulder. The treatment plan included wide local excision.   CANCER HISTORY:  Oncology History  Melanoma of skin ([place])  12/01/2019 Initial Diagnosis   Melanoma of skin ([place])    12/05/2019 Cancer Staging   Staging form: Melanoma of the Skin, AJCC 8th Edition - Clinical stage from 12/05/2019: Stage IIA (cT3a, cN0, cM0) - Signed by Derwood Kaplan, MD on 05/30/2020      RISK FACTORS:  Colonoscopy: no; not examined. PSA screening: yes Any excessive radiation exposure in the past: no  Past Medical History:  Diagnosis Date   Depression    mild   Family history of breast cancer 01/22/2021   Family history of prostate cancer 01/22/2021   Genital herpes    History of back injury 2003   Skin cancer    melanoma    Past Surgical History:  Procedure Laterality Date   SPINAL FUSION W/ LUQUE UNIT ROD  2003    Social History   Socioeconomic History   Marital status: Single    Spouse name:  Not on file   Number of children: Not on file   Years of education: Not on file   Highest education level: Not on file  Occupational History   Not on file  Tobacco Use   Smoking status: Never   Smokeless tobacco: Never  Substance and Sexual Activity   Alcohol use: Not Currently   Drug use: Not Currently   Sexual activity: Not on file  Other Topics Concern   Not on file  Social History Narrative   Not on file   Social Determinants of Health   Financial Resource Strain: Not on file  Food Insecurity: Not on file  Transportation Needs: Not on file  Physical Activity: Not on file  Stress: Not on file  Social Connections: Not on file     FAMILY HISTORY:  We obtained a detailed, 4-generation family history.  Significant diagnoses are listed below: Family History  Problem Relation Age of Onset   Prostate cancer Father        dx 81s   Breast cancer Maternal Aunt        dx after 15; bilateral mastectomy   Stomach cancer Maternal Grandfather        dx 8s   Breast cancer Cousin 10       maternal male cousin; mets    Xavier Jones is unaware of previous family history of genetic  testing for hereditary cancer risks. There is no reported Ashkenazi Jewish ancestry. There is no known consanguinity.  GENETIC COUNSELING ASSESSMENT: Xavier Jones is a 50 y.o. male with a personal and family history of cancer which is somewhat suggestive of a hereditary cancer syndrome and predisposition to cancer given the presence of related cancers in his maternal family and the presence of breast cancer before the age of 15. We, therefore, discussed and recommended the following at today's visit.   DISCUSSION: We discussed that 5-10% of cancer is hereditary, with most cases of hereditary breast cancer and melanoma associated with mutations in BRCA2.  There are other genes that can be associated with hereditary breast cancer syndromes or melanoma syndromes.  Type of cancer risk and level of risk are  gene-specific. We discussed that testing is beneficial for several reasons including knowing how to follow individuals after completing their treatment and understanding if other family members could be at risk for cancer and allowing them to undergo genetic testing.   We reviewed the characteristics, features and inheritance patterns of hereditary cancer syndromes. We also discussed genetic testing, including the appropriate family members to test, the process of testing, insurance coverage and turn-around-time for results. We discussed the implications of a negative, positive, carrier and/or variant of uncertain significant result. We recommended Xavier Jones pursue genetic testing for a panel that includes genes associated with breast cancer cancer and melanoma.   The Multi-Cancer + RNA Panel offered by [facility] includes sequencing and/or deletion/duplication analysis of the following 84 genes:  AIP*, ALK, APC*, ATM*, AXIN2*, BAP1*, BARD1*, BLM*, BMPR1A*, BRCA1*, BRCA2*, BRIP1*, CASR, CDC73*, CDH1*, CDK4, CDKN1B*, CDKN1C*, CDKN2A, CEBPA, CHEK2*, CTNNA1*, DICER1*, DIS3L2*, EGFR, EPCAM, FH*, FLCN*, GATA2*, GPC3, GREM1, HOXB13, HRAS, KIT, MAX*, MEN1*, MET, MITF, MLH1*, MSH2*, MSH3*, MSH6*, MUTYH*, NBN*, NF1*, NF2*, NTHL1*, PALB2*, PDGFRA, PHOX2B, PMS2*, POLD1*, POLE*, POT1*, PRKAR1A*, PTCH1*, PTEN*, RAD50*, RAD51C*, RAD51D*, RB1*, RECQL4, RET, RUNX1*, SDHA*, SDHAF2*, SDHB*, SDHC*, SDHD*, SMAD4*, SMARCA4*, SMARCB1*, SMARCE1*, STK11*, SUFU*, TERC, TERT, TMEM127*, Tp53*, TSC1*, TSC2*, VHL*, WRN*, and WT1.  RNA analysis is performed for * genes.   Based on Xavier Jones personal and family history of cancer, he meets medical criteria for genetic testing. Despite that he meets criteria, he may still have an out of pocket cost. We discussed that if his out of pocket cost for testing is over $100, the laboratory will contact him to discuss self-pay prices or patient pay assistance programs.   PLAN: After considering the  risks, benefits, and limitations, Xavier Jones provided informed consent to pursue genetic testing.   The blood sample will be collected on Monday, July 25th, when he has the [facility] for other appointments and will be sent to [facility] for analysis of the Multi-Cancer +RNA Panel. Results should be available within approximately 3 weeks after sample collection, at which point they will be disclosed by telephone to Xavier Jones, as will any additional recommendations warranted by these results. Xavier Jones will receive a summary of his genetic counseling visit and a copy of his results once available. This information will also be available in Epic.   Lastly, we encouraged Xavier Jones to remain in contact with cancer genetics annually so that we can continuously update the family history and inform him of any changes in cancer genetics and testing that may be of benefit for this family.   Xavier Jones were answered to his satisfaction today. Our contact information was provided should additional Jones or concerns arise. Thank you for  the referral and allowing [place] to share in the care of your patient.   Emmani Lesueur M. Joette Catching, [place], [facility] Genetic Counselor [email or web address] (P) [phone]  The patient was seen for a total of 30 minutes in face-to-face genetic counseling.  The patient was seen alone.  Drs. Magrinat, Lindi Adie and/or Burr Medico were available to discuss this case as needed.    _______________________________________________________________________ For Office Staff:  Number of people involved in session: 1 Was an Intern/ student involved with case: no

## 2021-01-28 ENCOUNTER — Encounter: Payer: Self-pay | Admitting: Hematology and Oncology

## 2021-01-28 ENCOUNTER — Telehealth: Payer: Self-pay | Admitting: Hematology and Oncology

## 2021-01-28 ENCOUNTER — Inpatient Hospital Stay (INDEPENDENT_AMBULATORY_CARE_PROVIDER_SITE_OTHER): Payer: Medicare Other | Admitting: Hematology and Oncology

## 2021-01-28 ENCOUNTER — Other Ambulatory Visit: Payer: Self-pay

## 2021-01-28 ENCOUNTER — Inpatient Hospital Stay: Payer: Medicare Other

## 2021-01-28 VITALS — BP 133/75 | HR 97 | Temp 98.5°F | Resp 16 | Ht 68.5 in | Wt 159.9 lb

## 2021-01-28 DIAGNOSIS — C439 Malignant melanoma of skin, unspecified: Secondary | ICD-10-CM

## 2021-01-28 NOTE — Progress Notes (Signed)
[facility]  [address] [place],  [place]  [id number] [phone]  Clinic Day:  01/28/2021  Referring physician: Penelope Coop, FNP   CHIEF COMPLAINT:  CC:  Stage IIB malignant melanoma of the right shoulder  Current Treatment:   Observation   HISTORY OF PRESENT ILLNESS:  Xavier Jones is a 50 y.o. male with stage IIB (T3b N0 M0) malignant melanoma of the right posterior shoulder diagnosed in May 2021.  We began seeing him in June.  He reported an enlarging nevus of the right shoulder for about a year.  It began to bleed, so he saw his dermatologist.  Biopsy revealed malignant melanoma, Breslow's depth 3.3 mm, Clark level IV, however, there was no overt attachment to the overlying epidermis, so a metastatic nodule could not be ruled out.  He had several other lesions removed at the time of biopsy.  Right lower lateral back shave revealed dysplastic compound nevus with severe atypia, limited margins free.  Right lower medial back shave revealed dysplastic nevus with moderate to severe atypia, peripheral margin involved.  Left mid buttocks shave revealed dysplastic compound nevus with moderate atypia, limited margins free.  MRI head and CT chest, abdomen and pelvis in June were negative for metastatic disease. He underwent wide local excision of the right shoulder melanoma with Dr. Coralie Keens in June.  Pathology revealed residual dermal malignant melanoma, but margins were clear.  3 sentinel lymph nodes were negative for metastasis (0/3).  However, there was a question as to whether this represents a metastatic lesion, rather than a primary, based on the presence of 2 nodules and no epidermal involvement.  He also had excision of the lower back lesion, which did not reveal malignancy. No adjuvant therapy was recommended. PET scan in September did not reveal any findings of hypermetabolic metastatic disease, there was low level activity felt to be postoperative  changes in the skin/subcutaneous tissue of the right posterior shoulder, as well as likely degenerative hypermetabolism of the proximal, posterior right femur.  At his visit in November, he had persistent bleeding of the right shoulder incision with fibrosis of the skin, so he was referred back to Dr. Coralie Keens for a biopsy.  This revealed hypertrophic type scar of the skin of the right shoulder. I saw him last month and he had a tiny submandibular lymph node in the left neck, so scheduled him to return in 1 month for re-evaluation.  A chest x-ray and labs at that time were normal. He had additional skin lesions removed earlier this year, which were not melanoma or pre cancer.  Due to his personal and family history of malignancy, I scheduled him for a consultation with the genetic counselor to discuss testing for hereditary cancer syndromes.  INTERVAL HISTORY:  Xavier Jones is here today for repeat clinical assessment to reassess a previously palpated left submental lymph node. He feels that the lymph node is smaller. He denies concerning skin lesions.  He denies fevers or chills. He denies pain. His appetite is good. His weight has been stable.   REVIEW OF SYSTEMS:  Review of Systems  Constitutional:  Negative for appetite change, chills, fatigue, fever and unexpected weight change.  HENT:   Negative for lump/mass, mouth sores and sore throat.   Respiratory:  Negative for cough and shortness of breath.   Cardiovascular:  Negative for chest pain and leg swelling.  Gastrointestinal:  Negative for abdominal pain, constipation, diarrhea, nausea and vomiting.  Genitourinary:  Negative  for difficulty urinating, dysuria, frequency and hematuria.   Musculoskeletal:  Negative for arthralgias, back pain and myalgias.  Skin:  Negative for itching, rash and wound.  Neurological:  Negative for dizziness, extremity weakness, headaches, light-headedness and numbness.  Hematological:  Negative for adenopathy.   Psychiatric/Behavioral:  Negative for depression and sleep disturbance. The patient is not nervous/anxious.     VITALS:  Blood pressure 133/75, pulse 97, temperature 98.5 F (36.9 C), resp. rate 16, height 5' 8.5" (1.74 m), weight 159 lb 14.4 oz (72.5 kg), SpO2 97 %.  Wt Readings from Last 3 Encounters:  01/28/21 159 lb 14.4 oz (72.5 kg)  12/31/20 160 lb 9.6 oz (72.8 kg)  05/30/20 160 lb 4.8 oz (72.7 kg)    Body mass index is 23.96 kg/m.  Performance status (ECOG): 1 - Symptomatic but completely ambulatory  PHYSICAL EXAM:  Physical Exam Vitals and nursing note reviewed.  Constitutional:      General: He is not in acute distress.    Appearance: Normal appearance. He is normal weight.  HENT:     Head: Normocephalic and atraumatic.     Mouth/Throat:     Mouth: Mucous membranes are moist.     Pharynx: Oropharynx is clear. No oropharyngeal exudate or posterior oropharyngeal erythema.  Eyes:     General: No scleral icterus.    Extraocular Movements: Extraocular movements intact.     Conjunctiva/sclera: Conjunctivae normal.     Pupils: Pupils are equal, round, and reactive to light.  Cardiovascular:     Rate and Rhythm: Normal rate and regular rhythm.     Heart sounds: Normal heart sounds. No murmur heard.   No friction rub. No gallop.  Pulmonary:     Effort: Pulmonary effort is normal.     Breath sounds: Normal breath sounds. No wheezing, rhonchi or rales.  Chest:  Breasts:    Right: No axillary adenopathy or supraclavicular adenopathy.     Left: No axillary adenopathy or supraclavicular adenopathy.  Abdominal:     General: Bowel sounds are normal. There is no distension.     Palpations: Abdomen is soft. There is no hepatomegaly, splenomegaly or mass.     Tenderness: There is no abdominal tenderness.  Musculoskeletal:        General: Normal range of motion.     Cervical back: Normal range of motion and neck supple. No tenderness.     Right lower leg: No edema.     Left  lower leg: No edema.  Lymphadenopathy:     Cervical: No cervical adenopathy.     Upper Body:     Right upper body: No supraclavicular or axillary adenopathy.     Left upper body: No supraclavicular or axillary adenopathy.     Lower Body: No right inguinal adenopathy. No left inguinal adenopathy.  Skin:    General: Skin is warm and dry.     Coloration: Skin is not jaundiced.     Findings: No rash.  Neurological:     Mental Status: He is alert and oriented to person, place, and time.     Cranial Nerves: No cranial nerve deficit.  Psychiatric:        Mood and Affect: Mood normal.        Behavior: Behavior normal.        Thought Content: Thought content normal.    LABS:   CBC Latest Ref Rng & Units 12/31/2020 05/30/2020  WBC - 5.9 5.0  Hemoglobin 13.5 - 17.5 13.9 14.1  Hematocrit  41 - 53 41 42  Platelets 150 - 399 296 330   CMP Latest Ref Rng & Units 12/31/2020 05/30/2020  BUN 4 - 21 15 17   Creatinine 0.6 - 1.3 0.8 0.9  Sodium 137 - 147 138 140  Potassium 3.4 - 5.3 4.2 4.4  Chloride 99 - 108 102 104  CO2 13 - 22 30(A) 29(A)  Calcium 8.7 - 10.7 9.3 9.8  Alkaline Phos 25 - 125 67 64  AST 14 - 40 23 26  ALT 10 - 40 12 12     No results found for: CEA1 / No results found for: CEA1 No results found for: PSA1 No results found for: QDI264 No results found for: BRA309  No results found for: TOTALPROTELP, ALBUMINELP, A1GS, A2GS, BETS, BETA2SER, GAMS, MSPIKE, SPEI No results found for: TIBC, FERRITIN, IRONPCTSAT No results found for: LDH  STUDIES:  No results found.   HISTORY:   Past Medical History:  Diagnosis Date   Depression    mild   Family history of breast cancer 01/22/2021   Family history of prostate cancer 01/22/2021   Genital herpes    History of back injury 2003   Skin cancer    melanoma    Past Surgical History:  Procedure Laterality Date   SPINAL FUSION W/ LUQUE UNIT ROD  2003    Family History  Problem Relation Age of Onset   Prostate cancer  Father        dx 42s   Breast cancer Maternal Aunt        dx after 44; bilateral mastectomy   Stomach cancer Maternal Grandfather        dx 69s   Breast cancer Cousin 41       maternal male cousin; mets    Social History:  reports that he has quit smoking. His smoking use included cigarettes. He has never used smokeless tobacco. He reports previous alcohol use. He reports previous drug use.The patient is alone today.  Allergies:  Allergies  Allergen Reactions   Sulfa Antibiotics    Testosterone     Current Medications: Current Outpatient Medications  Medication Sig Dispense Refill   acyclovir (ZOVIRAX) 400 MG tablet Take 400 mg by mouth 5 (five) times daily.     amitriptyline (ELAVIL) 25 MG tablet Take 25 mg by mouth at bedtime.     ergocalciferol (VITAMIN D2) 1.25 MG (50000 UT) capsule Take 50,000 Units by mouth once a week.     No current facility-administered medications for this visit.     ASSESSMENT & PLAN:   Assessment:    1. Stage IIB malignant melanoma of the right posterior shoulder, Breslow's depth 3.3 mm, Clark level IV, treated with wide local excision.  There wasstill a question as to whether this represents a metastatic lesion, rather than a primary, based on the presence of 2 nodules and no epidermal involvement.  MRI head and CT chest, abdomen and pelvis in June were negative for metastatic disease.   No adjuvant therapy was recommended.  PET scan in November was negative for metastatic disease. Chest x-ray in June was negative. He remains without obvious evidence of recurrence.   2.  Multiple dysplastic nevi of the right lower back with severe atypia.  The nevus of the medial back had positive peripheral margins, so he underwent re-excision of this area as well, which only revealed dermal scar.   3.  Dysplastic compound nevus of the left mid buttock with moderate atypia.  Margins were  free of neoplasm.   4.  Tiny benign appearing lymph node in the left  submental area likely due to dental infection. This has resolved.  5.  Maternal family history of breast cancer in addition to his melanoma history which raises suspicion for possible BRCA mutation.  He met with the genetic counselors last week and we are drawing blood for the [facility] RNA + hereditary cancer panel test.    Plan:    He has met with the genetic counselor and will proceed with genetic testing.  His blood was drawn today. The previously palpated left submental node has resolved. We will plan to see him in 6 months with a CBC and comprehensive metabolic panel for repeat clinical assessment. The patient understands the plans discussed today and is in agreement with them.  He knows to contact our office if he develops concerns prior to his next appointment.    [name], PA-C

## 2021-01-28 NOTE — Telephone Encounter (Signed)
Per 7/25 los next appt scheduled and given to patient

## 2021-01-30 ENCOUNTER — Encounter: Payer: Self-pay | Admitting: Hematology and Oncology

## 2021-01-30 DIAGNOSIS — Z79899 Other long term (current) drug therapy: Secondary | ICD-10-CM | POA: Diagnosis not present

## 2021-01-30 DIAGNOSIS — Z125 Encounter for screening for malignant neoplasm of prostate: Secondary | ICD-10-CM | POA: Diagnosis not present

## 2021-01-30 DIAGNOSIS — Z1211 Encounter for screening for malignant neoplasm of colon: Secondary | ICD-10-CM | POA: Diagnosis not present

## 2021-01-30 DIAGNOSIS — E559 Vitamin D deficiency, unspecified: Secondary | ICD-10-CM | POA: Diagnosis not present

## 2021-01-30 DIAGNOSIS — Z6823 Body mass index (BMI) 23.0-23.9, adult: Secondary | ICD-10-CM | POA: Diagnosis not present

## 2021-01-30 DIAGNOSIS — G4700 Insomnia, unspecified: Secondary | ICD-10-CM | POA: Diagnosis not present

## 2021-01-30 DIAGNOSIS — A6002 Herpesviral infection of other male genital organs: Secondary | ICD-10-CM | POA: Diagnosis not present

## 2021-02-21 DIAGNOSIS — Z1211 Encounter for screening for malignant neoplasm of colon: Secondary | ICD-10-CM | POA: Diagnosis not present

## 2021-02-21 DIAGNOSIS — Z1212 Encounter for screening for malignant neoplasm of rectum: Secondary | ICD-10-CM | POA: Diagnosis not present

## 2021-02-22 ENCOUNTER — Encounter: Payer: Self-pay | Admitting: Genetic Counselor

## 2021-02-22 ENCOUNTER — Ambulatory Visit: Payer: Self-pay | Admitting: Genetic Counselor

## 2021-02-22 ENCOUNTER — Telehealth: Payer: Self-pay | Admitting: Genetic Counselor

## 2021-02-22 DIAGNOSIS — Z1379 Encounter for other screening for genetic and chromosomal anomalies: Secondary | ICD-10-CM

## 2021-02-22 DIAGNOSIS — Z803 Family history of malignant neoplasm of breast: Secondary | ICD-10-CM

## 2021-02-22 DIAGNOSIS — C439 Malignant melanoma of skin, unspecified: Secondary | ICD-10-CM

## 2021-02-22 DIAGNOSIS — Z8042 Family history of malignant neoplasm of prostate: Secondary | ICD-10-CM

## 2021-02-22 HISTORY — DX: Encounter for other screening for genetic and chromosomal anomalies: Z13.79

## 2021-02-22 NOTE — Telephone Encounter (Signed)
Revealed negative genetic testing and variants of uncertain significance in APC, CDKN1C, and FH. Discussed that we do not know why he has melanoma or why there is cancer in the family. It could be due to sporadic/familial, a different gene that we are not testing, or maybe our current technology may not be able to pick something up.  It will be important for him to keep in contact with genetics to keep up with whether additional testing may be needed.  Recommended his maternal cousin with breast cancer consider genetic testing.

## 2021-03-03 NOTE — Progress Notes (Signed)
HPI:  Mr. Xavier Jones was previously seen in the [place] clinic due to a personal and family history of cancer and concerns regarding a hereditary predisposition to cancer. Please refer to our prior cancer genetics clinic note for more information regarding our discussion, assessment and recommendations, at the time. Mr. [name] recent genetic test results were disclosed to him, as were recommendations warranted by these results. These results and recommendations are discussed in more detail below.  CANCER HISTORY:  Oncology History  Melanoma of skin ([place])  12/01/2019 Initial Diagnosis   Melanoma of skin ([place])   12/05/2019 Cancer Staging   Staging form: Melanoma of the Skin, AJCC 8th Edition - Clinical stage from 12/05/2019: Stage IIA (cT3a, cN0, cM0) - Signed by Derwood Kaplan, MD on 05/30/2020   02/18/2021 Genetic Testing   No pathogenic variants detected in [facility] Multi-Cancer +RNA Panel.  Variants of uncertain significance detected in APC (c.681C>G (p.Asp227Glu)), CDKN1C (c.610C>G (p.Pro204Ala)), and FH (c.1151C>T (p.Ala384Val)).  The report date is February 18, 2021.    The Multi-Cancer + RNA Panel offered by [facility] includes sequencing and/or deletion/duplication analysis of the following 84 genes:  AIP*, ALK, APC*, ATM*, AXIN2*, BAP1*, BARD1*, BLM*, BMPR1A*, BRCA1*, BRCA2*, BRIP1*, CASR, CDC73*, CDH1*, CDK4, CDKN1B*, CDKN1C*, CDKN2A, CEBPA, CHEK2*, CTNNA1*, DICER1*, DIS3L2*, EGFR, EPCAM, FH*, FLCN*, GATA2*, GPC3, GREM1, HOXB13, HRAS, KIT, MAX*, MEN1*, MET, MITF, MLH1*, MSH2*, MSH3*, MSH6*, MUTYH*, NBN*, NF1*, NF2*, NTHL1*, PALB2*, PDGFRA, PHOX2B, PMS2*, POLD1*, POLE*, POT1*, PRKAR1A*, PTCH1*, PTEN*, RAD50*, RAD51C*, RAD51D*, RB1*, RECQL4, RET, RUNX1*, SDHA*, SDHAF2*, SDHB*, SDHC*, SDHD*, SMAD4*, SMARCA4*, SMARCB1*, SMARCE1*, STK11*, SUFU*, TERC, TERT, TMEM127*, Tp53*, TSC1*, TSC2*, VHL*, WRN*, and WT1.  RNA analysis is performed for * genes.     FAMILY HISTORY:  We obtained  a detailed, 4-generation family history.  Significant diagnoses are listed below: Family History  Problem Relation Age of Onset   Prostate cancer Father        dx 36s   Breast cancer Maternal Aunt        dx after 38; bilateral mastectomy   Stomach cancer Maternal Grandfather        dx 8s   Breast cancer Cousin 50       maternal male cousin; mets    Mr. Aument is unaware of previous family history of genetic testing for hereditary cancer risks. There is no reported Ashkenazi Jewish ancestry. There is no known consanguinity.  GENETIC TEST RESULTS: Genetic testing reported out on February 18, 2021.  The [facility] Multi-Cancer + RNA Panel found no pathogenic mutations. The Multi-Cancer + RNA Panel offered by [facility] includes sequencing and/or deletion/duplication analysis of the following 84 genes:  AIP*, ALK, APC*, ATM*, AXIN2*, BAP1*, BARD1*, BLM*, BMPR1A*, BRCA1*, BRCA2*, BRIP1*, CASR, CDC73*, CDH1*, CDK4, CDKN1B*, CDKN1C*, CDKN2A, CEBPA, CHEK2*, CTNNA1*, DICER1*, DIS3L2*, EGFR, EPCAM, FH*, FLCN*, GATA2*, GPC3, GREM1, HOXB13, HRAS, KIT, MAX*, MEN1*, MET, MITF, MLH1*, MSH2*, MSH3*, MSH6*, MUTYH*, NBN*, NF1*, NF2*, NTHL1*, PALB2*, PDGFRA, PHOX2B, PMS2*, POLD1*, POLE*, POT1*, PRKAR1A*, PTCH1*, PTEN*, RAD50*, RAD51C*, RAD51D*, RB1*, RECQL4, RET, RUNX1*, SDHA*, SDHAF2*, SDHB*, SDHC*, SDHD*, SMAD4*, SMARCA4*, SMARCB1*, SMARCE1*, STK11*, SUFU*, TERC, TERT, TMEM127*, Tp53*, TSC1*, TSC2*, VHL*, WRN*, and WT1.  RNA analysis is performed for * genes.  The test report has been scanned into EPIC and is located under the Molecular Pathology section of the Results Review tab.  A portion of the result report is included below for reference.   with breast cancer consider genetic testing.          We  discussed with Mr. Spease that because current genetic testing is not perfect, it is possible there may be a gene mutation in one of these genes that current testing cannot detect, but that chance is small.  We also  discussed, that there could be another gene that has not yet been discovered, or that we have not yet tested, that is responsible for the cancer diagnoses in the family. It is also possible there is a hereditary cause for the cancer in the family that Mr. Capshaw did not inherit and therefore was not identified in his testing.  Therefore, it is important to remain in touch with cancer genetics in the future so that we can continue to offer Mr. Ballo the most up to date genetic testing.   Genetic testing did identify three variants of uncertain significance (VUS) - one in the APC gene called c.681C>G (p.Asp227Glu), a second in the CDKN1C gene called c.610C>G (p. Pro204Ala), and a third in the FH gene called c.1151C>T (p.Ala384Val).  At this time, it is unknown if these variants are associated with increased cancer risk or if they are normal findings, but most variants such as these get reclassified to being inconsequential. They should not be used to make medical management decisions. With time, we suspect the lab will determine the significance of these variants, if any. If we do learn more about them, we will try to contact Mr. Gras to discuss it further. However, it is important to stay in touch with [place] periodically and keep the address and phone number up to date.  ADDITIONAL GENETIC TESTING: We discussed with Mr. Wirt that his genetic testing was fairly extensive.  If there are genes identified to increase cancer risk that can be analyzed in the future, we would be happy to discuss and coordinate this testing at that time.    CANCER SCREENING RECOMMENDATIONS: Mr. Hillier test result is considered negative (normal).  This means that we have not identified a hereditary cause for his personal history of cancer at this time. Most cancers happen by chance and this negative test suggests that his cancer may fall into this category.    While reassuring, this does not definitively rule out a hereditary  predisposition to cancer. It is still possible that there could be genetic mutations that are undetectable by current technology. There could be genetic mutations in genes that have not been tested or identified to increase cancer risk.  Therefore, it is recommended he continue to follow the cancer management and screening guidelines provided by his oncology and primary healthcare provider.   An individual's cancer risk and medical management are not determined by genetic test results alone. Overall cancer risk assessment incorporates additional factors, including personal medical history, family history, and any available genetic information that may result in a personalized plan for cancer prevention and surveillance  RECOMMENDATIONS FOR FAMILY MEMBERS:  Individuals in this family might be at some increased risk of developing cancer, over the general population risk, simply due to the family history of cancer.  We recommended women in this family have a yearly mammogram beginning at age 95, or 106 years younger than the earliest onset of cancer, an annual clinical breast exam, and perform monthly breast self-exams. Women in this family should also have a gynecological exam as recommended by their primary provider. All family members should be referred for colonoscopy starting at age 40.  Members of this family should consider annual skin checks with a dermatologist.   It is also  possible there is a hereditary cause for the cancer in Mr. Denomme family that he did not inherit and therefore was not identified in him.  Based on Mr. Athey family history, we recommended his maternal cousin, who was diagnosed with breast cancer at age 17, have genetic counseling and testing. Mr. Wilson will let us know if we can be of any assistance in coordinating genetic counseling and/or testing for this family member.   FOLLOW-UP: Lastly, we discussed with Mr. Kidd that cancer genetics is a rapidly advancing field and it is  possible that new genetic tests will be appropriate for him and/or his family members in the future. We encouraged him to remain in contact with cancer genetics on an annual basis so we can update his personal and family histories and let him know of advances in cancer genetics that may benefit this family.   Our contact number was provided. Mr. Kun questions were answered to his satisfaction, and he knows he is welcome to call us at anytime with additional questions or concerns.     Duyen Beckom M. Joette Catching, [place], [facility] Genetic Counselor [email or web address] (P) [phone]

## 2021-05-01 DIAGNOSIS — H26491 Other secondary cataract, right eye: Secondary | ICD-10-CM | POA: Diagnosis not present

## 2021-06-05 DIAGNOSIS — H26491 Other secondary cataract, right eye: Secondary | ICD-10-CM | POA: Diagnosis not present

## 2021-06-11 DIAGNOSIS — D225 Melanocytic nevi of trunk: Secondary | ICD-10-CM | POA: Diagnosis not present

## 2021-06-11 DIAGNOSIS — D485 Neoplasm of uncertain behavior of skin: Secondary | ICD-10-CM | POA: Diagnosis not present

## 2021-06-11 DIAGNOSIS — Z85820 Personal history of malignant melanoma of skin: Secondary | ICD-10-CM | POA: Diagnosis not present

## 2021-06-11 DIAGNOSIS — L820 Inflamed seborrheic keratosis: Secondary | ICD-10-CM | POA: Diagnosis not present

## 2021-07-04 DIAGNOSIS — H26492 Other secondary cataract, left eye: Secondary | ICD-10-CM | POA: Diagnosis not present

## 2021-07-04 DIAGNOSIS — H5319 Other subjective visual disturbances: Secondary | ICD-10-CM | POA: Diagnosis not present

## 2021-07-25 NOTE — Progress Notes (Signed)
[facility]  [address] [facility] [place],  [place]  [address] [phone]  Clinic Day:  07/31/2021  Referring physician: Penelope Coop, FNP  This document serves as a record of services personally performed by Hosie Poisson, MD. It was created on their behalf by Curry,Lauren E, a trained medical scribe. The creation of this record is based on the scribe's personal observations and the provider's statements to them.  CHIEF COMPLAINT:  CC:  Stage IIB malignant melanoma of the right shoulder  Current Treatment:   Observation   HISTORY OF PRESENT ILLNESS:  Xavier Jones is a 51 y.o. male with stage IIB (T3b N0 M0) malignant melanoma of the right posterior shoulder diagnosed in May 2021.  We began seeing him in June.  He reported an enlarging nevus of the right shoulder for about a year.  It began to bleed, so he saw his dermatologist.  Biopsy revealed malignant melanoma, Breslow's depth 3.3 mm, Clark level IV, however, there was no overt attachment to the overlying epidermis, so a metastatic nodule could not be ruled out.  He had several other lesions removed at the time of biopsy.  Right lower lateral back shave revealed dysplastic compound nevus with severe atypia, limited margins free.  Right lower medial back shave revealed dysplastic nevus with moderate to severe atypia, peripheral margin involved.  Left mid buttocks shave revealed dysplastic compound nevus with moderate atypia, limited margins free.  MRI head and CT chest, abdomen and pelvis in June were negative for metastatic disease. He underwent wide local excision of the right shoulder melanoma with Dr. Coralie Keens in June.  Pathology revealed residual dermal malignant melanoma, but margins were clear.  3 sentinel lymph nodes were negative for metastasis (0/3).  However, there was a question as to whether this represents a metastatic lesion, rather than a primary, based on the presence of 2 nodules and no  epidermal involvement.  He also had excision of the lower back lesion, which did not reveal malignancy. No adjuvant therapy was recommended. PET scan in September did not reveal any findings of hypermetabolic metastatic disease, there was low level activity felt to be postoperative changes in the skin/subcutaneous tissue of the right posterior shoulder, as well as likely degenerative hypermetabolism of the proximal, posterior right femur.  At his visit in November, he had persistent bleeding of the right shoulder incision with fibrosis of the skin, so he was referred back to Dr. Coralie Keens for a biopsy.  This revealed hypertrophic type scar of the skin of the right shoulder. I saw him last month and he had a tiny submandibular lymph node in the left neck, so scheduled him to return in 1 month for re-evaluation.  A chest x-ray and labs at that time were normal. He had additional skin lesions removed earlier this year, which were not melanoma or pre cancer.  Due to his personal and family history of malignancy, he was scheduled for a consultation with the genetic counselor to discuss testing for hereditary cancer syndromes. [facility] testing from August 2022 revealed variants of uncertain significance (VUS) of the APC, CDKN1C and FH gene.  INTERVAL HISTORY:  Marques is here for routine follow up and states that he is doing well and denies complaints. He does have some mild pain of the right shoulder from his prior surgery on occasion. Blood counts and chemistries are unremarkable. His  appetite is good, and he has gained 3 and 1/2 pounds since his last visit.  He  denies fever, chills or other signs of infection.  He denies nausea, vomiting, bowel issues, or abdominal pain.  He denies sore throat, cough, dyspnea, or chest pain.  REVIEW OF SYSTEMS:  Review of Systems  Constitutional: Negative.  Negative for appetite change, chills, fatigue, fever and unexpected weight change.  HENT:  Negative.    Eyes: Negative.    Respiratory: Negative.  Negative for chest tightness, cough, hemoptysis, shortness of breath and wheezing.   Cardiovascular: Negative.  Negative for chest pain, leg swelling and palpitations.  Gastrointestinal: Negative.  Negative for abdominal distention, abdominal pain, blood in stool, constipation, diarrhea, nausea and vomiting.  Endocrine: Negative.   Genitourinary: Negative.  Negative for difficulty urinating, dysuria, frequency and hematuria.   Musculoskeletal: Negative.  Negative for arthralgias, back pain, flank pain, gait problem and myalgias.  Skin: Negative.   Neurological: Negative.  Negative for dizziness, extremity weakness, gait problem, headaches, light-headedness, numbness, seizures and speech difficulty.  Hematological: Negative.   Psychiatric/Behavioral: Negative.  Negative for depression and sleep disturbance. The patient is not nervous/anxious.   All other systems reviewed and are negative.   VITALS:  Blood pressure 128/75, pulse 100, temperature 98.2 F (36.8 C), temperature source Oral, resp. rate 18, height 5' 8.5" (1.74 m), weight 162 lb 11.2 oz (73.8 kg), SpO2 100 %.  Wt Readings from Last 3 Encounters:  07/31/21 162 lb 11.2 oz (73.8 kg)  01/28/21 159 lb 14.4 oz (72.5 kg)  12/31/20 160 lb 9.6 oz (72.8 kg)    Body mass index is 24.38 kg/m.  Performance status (ECOG): 0 - Asymptomatic  PHYSICAL EXAM:  Physical Exam Constitutional:      General: He is not in acute distress.    Appearance: Normal appearance. He is normal weight.  HENT:     Head: Normocephalic and atraumatic.  Eyes:     General: No scleral icterus.    Extraocular Movements: Extraocular movements intact.     Conjunctiva/sclera: Conjunctivae normal.     Pupils: Pupils are equal, round, and reactive to light.  Cardiovascular:     Rate and Rhythm: Normal rate and regular rhythm.     Pulses: Normal pulses.     Heart sounds: Normal heart sounds. No murmur heard.   No friction rub. No gallop.   Pulmonary:     Effort: Pulmonary effort is normal. No respiratory distress.     Breath sounds: Normal breath sounds.  Abdominal:     General: Bowel sounds are normal. There is no distension.     Palpations: Abdomen is soft. There is no hepatomegaly, splenomegaly or mass.     Tenderness: There is no abdominal tenderness.  Musculoskeletal:        General: Normal range of motion.     Cervical back: Normal range of motion and neck supple.     Right lower leg: No edema.     Left lower leg: No edema.  Lymphadenopathy:     Cervical: No cervical adenopathy.  Skin:    General: Skin is warm and dry.     Comments: Thickened scar of the right posterior shoulder, well healed, no evidence of nodules or skin lesions. Large scar in the right mid back posteriorly, which is well healed. Another scar across the left flank. Tiny 1 cm nodule in the left lateral flank scar which feels benign.  Neurological:     General: No focal deficit present.     Mental Status: He is alert and oriented to person, place, and time. Mental status  is at baseline.  Psychiatric:        Mood and Affect: Mood normal.        Behavior: Behavior normal.        Thought Content: Thought content normal.        Judgment: Judgment normal.    LABS:   CBC Latest Ref Rng & Units 07/31/2021 12/31/2020 05/30/2020  WBC - 5.2 5.9 5.0  Hemoglobin 13.5 - 17.5 14.0 13.9 14.1  Hematocrit 41 - 53 42 41 42  Platelets 150 - 399 359 296 330   CMP Latest Ref Rng & Units 07/31/2021 12/31/2020 05/30/2020  BUN 4 - 21 18 15 17   Creatinine 0.6 - 1.3 0.9 0.8 0.9  Sodium 137 - 147 138 138 140  Potassium 3.4 - 5.3 4.3 4.2 4.4  Chloride 99 - 108 105 102 104  CO2 13 - 22 28(A) 30(A) 29(A)  Calcium 8.7 - 10.7 9.1 9.3 9.8  Alkaline Phos 25 - 125 69 67 64  AST 14 - 40 27 23 26   ALT 10 - 40 16 12 12     STUDIES:  No results found.   HISTORY:   Allergies:  Allergies  Allergen Reactions   Testosterone Itching   Sulfa Antibiotics Rash     Current Medications: Current Outpatient Medications  Medication Sig Dispense Refill   acyclovir (ZOVIRAX) 200 MG capsule Take by mouth.     amitriptyline (ELAVIL) 25 MG tablet Take 25 mg by mouth at bedtime.     ergocalciferol (VITAMIN D2) 1.25 MG (50000 UT) capsule Take 50,000 Units by mouth once a week.     No current facility-administered medications for this visit.     ASSESSMENT & PLAN:   Assessment:   1. 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  There was still a question as to whether this represents a metastatic lesion, rather than a primary, based on the presence of 2 nodules and no epidermal involvement.  MRI head and CT chest, abdomen and pelvis in June were negative for metastatic disease.   No adjuvant therapy was recommended.  PET scan in September was negative for metastatic disease. Chest x-ray in June 2022 was negative. He remains without obvious evidence of recurrence.   2.  Multiple dysplastic nevi of the right lower back with severe atypia.  The nevus of the medial back had positive peripheral margins, so he underwent re-excision of this area as well, which only revealed dermal scar.   3.  Dysplastic compound nevus of the left mid buttock with moderate atypia.  Margins were free of neoplasm.  4.  Maternal family history of breast cancer in addition to his melanoma history which raises suspicion for possible BRCA mutation.  He met with the genetic counselors and [facility] RNA + hereditary cancer panel revealed variants of uncertain significance (VUS) of the APC, CDKN1C and FH genes.    Plan:     He will continue to follow with dermatology. We will plan to see him in 6 months with a CBC, comprehensive metabolic panel and chest x-ray for repeat clinical assessment. After that appointment, we can consider annual follow up since he will be over 2 years  postop. The patient understands the plans discussed today and is in agreement with them.  He knows to contact our office if he develops concerns prior to his next appointment.  I provided 15 minutes of face-to-face  time during this this encounter and > 50% was spent counseling as documented under my assessment and plan.    I, Rita Ohara, am acting as scribe for Derwood Kaplan, MD  I have reviewed this report as typed by the medical scribe, and it is complete and accurate.

## 2021-07-31 ENCOUNTER — Other Ambulatory Visit: Payer: Self-pay | Admitting: Oncology

## 2021-07-31 ENCOUNTER — Inpatient Hospital Stay: Payer: Medicare Other

## 2021-07-31 ENCOUNTER — Other Ambulatory Visit: Payer: Self-pay | Admitting: Hematology and Oncology

## 2021-07-31 ENCOUNTER — Other Ambulatory Visit: Payer: Self-pay

## 2021-07-31 ENCOUNTER — Encounter: Payer: Self-pay | Admitting: Oncology

## 2021-07-31 ENCOUNTER — Inpatient Hospital Stay: Payer: Medicare Other | Attending: Oncology | Admitting: Oncology

## 2021-07-31 ENCOUNTER — Telehealth: Payer: Self-pay | Admitting: Oncology

## 2021-07-31 VITALS — BP 128/75 | HR 100 | Temp 98.2°F | Resp 18 | Ht 68.5 in | Wt 162.7 lb

## 2021-07-31 DIAGNOSIS — C439 Malignant melanoma of skin, unspecified: Secondary | ICD-10-CM

## 2021-07-31 DIAGNOSIS — Z803 Family history of malignant neoplasm of breast: Secondary | ICD-10-CM | POA: Diagnosis not present

## 2021-07-31 LAB — CBC
MCV: 97 — AB (ref 80–94)
RBC: 4.38 (ref 3.87–5.11)

## 2021-07-31 LAB — HEPATIC FUNCTION PANEL
ALT: 16 (ref 10–40)
AST: 27 (ref 14–40)
Alkaline Phosphatase: 69 (ref 25–125)
Bilirubin, Total: 0.6

## 2021-07-31 LAB — COMPREHENSIVE METABOLIC PANEL
Albumin: 4.3 (ref 3.5–5.0)
Calcium: 9.1 (ref 8.7–10.7)

## 2021-07-31 LAB — CBC AND DIFFERENTIAL
HCT: 42 (ref 41–53)
Hemoglobin: 14 (ref 13.5–17.5)
Neutrophils Absolute: 3.28
Platelets: 359 (ref 150–399)
WBC: 5.2

## 2021-07-31 LAB — BASIC METABOLIC PANEL
BUN: 18 (ref 4–21)
CO2: 28 — AB (ref 13–22)
Chloride: 105 (ref 99–108)
Creatinine: 0.9 (ref 0.6–1.3)
Glucose: 133
Potassium: 4.3 (ref 3.4–5.3)
Sodium: 138 (ref 137–147)

## 2021-07-31 NOTE — Telephone Encounter (Signed)
Per 07/31/21 los next appt scheduled and confirmed with patient.Chest xray order also given to patient.

## 2021-08-05 DIAGNOSIS — A6002 Herpesviral infection of other male genital organs: Secondary | ICD-10-CM | POA: Diagnosis not present

## 2021-08-05 DIAGNOSIS — G4700 Insomnia, unspecified: Secondary | ICD-10-CM | POA: Diagnosis not present

## 2021-08-05 DIAGNOSIS — Z79899 Other long term (current) drug therapy: Secondary | ICD-10-CM | POA: Diagnosis not present

## 2021-08-05 DIAGNOSIS — E785 Hyperlipidemia, unspecified: Secondary | ICD-10-CM | POA: Diagnosis not present

## 2021-08-05 DIAGNOSIS — E559 Vitamin D deficiency, unspecified: Secondary | ICD-10-CM | POA: Diagnosis not present

## 2021-08-05 DIAGNOSIS — Z6824 Body mass index (BMI) 24.0-24.9, adult: Secondary | ICD-10-CM | POA: Diagnosis not present

## 2021-11-26 ENCOUNTER — Telehealth: Payer: Self-pay

## 2021-11-27 ENCOUNTER — Other Ambulatory Visit: Payer: Self-pay

## 2021-11-28 ENCOUNTER — Encounter: Payer: Self-pay | Admitting: Hematology and Oncology

## 2021-11-28 ENCOUNTER — Inpatient Hospital Stay: Payer: Medicare Other

## 2021-11-28 ENCOUNTER — Inpatient Hospital Stay: Payer: Medicare Other | Attending: Hematology and Oncology | Admitting: Hematology and Oncology

## 2021-11-28 VITALS — BP 137/84 | HR 85 | Temp 98.2°F | Resp 20 | Ht 68.5 in | Wt 161.1 lb

## 2021-11-28 DIAGNOSIS — R300 Dysuria: Secondary | ICD-10-CM | POA: Diagnosis not present

## 2021-11-28 DIAGNOSIS — Z803 Family history of malignant neoplasm of breast: Secondary | ICD-10-CM | POA: Diagnosis not present

## 2021-11-28 DIAGNOSIS — C439 Malignant melanoma of skin, unspecified: Secondary | ICD-10-CM

## 2021-11-28 DIAGNOSIS — D239 Other benign neoplasm of skin, unspecified: Secondary | ICD-10-CM | POA: Diagnosis not present

## 2021-11-28 NOTE — Progress Notes (Signed)
Patient Care Team: Earlyne Iba, NP as PCP - General (Nurse Practitioner)  Clinic Day:  11/29/2021  Referring physician: Earlyne Iba, NP  ASSESSMENT & PLAN:   Assessment & Plan: Melanoma of skin ([place]) Stage IIB malignant melanoma of the right posterior shoulder, Breslow's depth 3.3 mm, Clark level IV, treated with wide local excision, diagnosed in May 2021.  There was still a question as to whether this represents a metastatic lesion, rather than a primary, based on the presence of 2 nodules and no epidermal involvement.  MRI head and CT chest, abdomen and pelvis in June were negative for metastatic disease.   No adjuvant therapy was recommended.  PET scan in September was negative for metastatic disease. Chest x-ray in June 2022 was negative. He presents today for new area of concern to the left mid back. A superficial nodule is noted approximately 1 cm to the right of his upper thoracic spine. He cannot fully assess the area due to location, but feels like it has increased in size. There is no redness or warmth noted to the area.   Multiple dysplastic nevi Multiple dysplastic nevi of the right lower back with severe atypia.  The nevus of the medial back had positive peripheral margins, so he underwent re-excision of this area as well, which only revealed dermal scar. Dysplastic compound nevus of the left mid buttock with moderate atypia.  Margins were free of neoplasm.    Family history of breast cancer Maternal family history of breast cancer in addition to his melanoma history which raises suspicion for possible BRCA mutation.  He met with the genetic counselors and [facility] RNA + hereditary cancer panel revealed variants of uncertain significance (VUS) of the APC, CDKN1C and FH genes.    The patient understands the plans discussed today and is in agreement with them.  He knows to contact our office if he develops concerns prior to his next appointment.    Melodye Ped, NP  [facility] [address] [place] [address] Dept: [phone] Dept Fax: [phone]   Orders Placed This Encounter  Procedures   CT Chest W Contrast    Standing Status:   Future    Standing Expiration Date:   11/28/2022    Order Specific Question:   If indicated for the ordered procedure, I authorize the administration of contrast media per Radiology protocol    Answer:   Yes    Order Specific Question:   Preferred imaging location?    Answer:   External   Miscellaneous test (send-out)    Standing Status:   Future    Number of Occurrences:   1    Standing Expiration Date:   11/29/2022    Order Specific Question:   Test name / description:    Answer:   STAT Urinalysis with culture      CHIEF COMPLAINT:  CC: A 51 year old male with history of melanoma here for evaluation of new mid-thoracic nodule  Current Treatment:  Surveillance  INTERVAL HISTORY:  Xavier Jones is here today for repeat clinical assessment. He denies fevers or chills. He denies pain. His appetite is good. His weight has been stable.  I have reviewed the past medical history, past surgical history, social history and family history with the patient and they are unchanged from previous note.  ALLERGIES:  is allergic to testosterone and sulfa antibiotics.  MEDICATIONS:  Current Outpatient Medications  Medication Sig Dispense  Refill   acyclovir (ZOVIRAX) 400 MG tablet Take 400 mg by mouth 3 (three) times daily.     amitriptyline (ELAVIL) 25 MG tablet Take 25 mg by mouth at bedtime.     ergocalciferol (VITAMIN D2) 1.25 MG (50000 UT) capsule Take 50,000 Units by mouth once a week.     No current facility-administered medications for this visit.    HISTORY OF PRESENT ILLNESS:   Oncology History  Melanoma of skin ([place])  12/01/2019 Initial Diagnosis   Melanoma of skin ([place])    12/05/2019 Cancer Staging   Staging form: Melanoma of the Skin, AJCC 8th  Edition - Clinical stage from 12/05/2019: Stage IIA (cT3a, cN0, cM0) - Signed by Derwood Kaplan, MD on 05/30/2020    02/18/2021 Genetic Testing   No pathogenic variants detected in [facility] Multi-Cancer +RNA Panel.  Variants of uncertain significance detected in APC (c.681C>G (p.Asp227Glu)), CDKN1C (c.610C>G (p.Pro204Ala)), and FH (c.1151C>T (p.Ala384Val)).  The report date is February 18, 2021.    The Multi-Cancer + RNA Panel offered by [facility] includes sequencing and/or deletion/duplication analysis of the following 84 genes:  AIP*, ALK, APC*, ATM*, AXIN2*, BAP1*, BARD1*, BLM*, BMPR1A*, BRCA1*, BRCA2*, BRIP1*, CASR, CDC73*, CDH1*, CDK4, CDKN1B*, CDKN1C*, CDKN2A, CEBPA, CHEK2*, CTNNA1*, DICER1*, DIS3L2*, EGFR, EPCAM, FH*, FLCN*, GATA2*, GPC3, GREM1, HOXB13, HRAS, KIT, MAX*, MEN1*, MET, MITF, MLH1*, MSH2*, MSH3*, MSH6*, MUTYH*, NBN*, NF1*, NF2*, NTHL1*, PALB2*, PDGFRA, PHOX2B, PMS2*, POLD1*, POLE*, POT1*, PRKAR1A*, PTCH1*, PTEN*, RAD50*, RAD51C*, RAD51D*, RB1*, RECQL4, RET, RUNX1*, SDHA*, SDHAF2*, SDHB*, SDHC*, SDHD*, SMAD4*, SMARCA4*, SMARCB1*, SMARCE1*, STK11*, SUFU*, TERC, TERT, TMEM127*, Tp53*, TSC1*, TSC2*, VHL*, WRN*, and WT1.  RNA analysis is performed for * genes.       REVIEW OF SYSTEMS:   Constitutional: Denies fevers, chills or abnormal weight loss Eyes: Denies blurriness of vision Ears, nose, mouth, throat, and face: Denies mucositis or sore throat Respiratory: Denies cough, dyspnea or wheezes Cardiovascular: Denies palpitation, chest discomfort or lower extremity swelling Gastrointestinal:  Denies nausea, heartburn or change in bowel habits Skin: Denies abnormal skin rashes Lymphatics: Denies new lymphadenopathy or easy bruising Neurological:Denies numbness, tingling or new weaknesses Behavioral/Psych: Mood is stable, no new changes  All other systems were reviewed with the patient and are negative.   VITALS:  Blood pressure 137/84, pulse 85, temperature 98.2 F (36.8 C),  temperature source Oral, resp. rate 20, height 5' 8.5" (1.74 m), weight 161 lb 1.6 oz (73.1 kg), SpO2 100 %.  Wt Readings from Last 3 Encounters:  11/28/21 161 lb 1.6 oz (73.1 kg)  07/31/21 162 lb 11.2 oz (73.8 kg)  01/28/21 159 lb 14.4 oz (72.5 kg)    Body mass index is 24.14 kg/m.  Performance status (ECOG): 1 - Symptomatic but completely ambulatory  PHYSICAL EXAM:   GENERAL:alert, no distress and comfortable SKIN: skin color, texture, turgor are normal, no rashes or significant lesions. Approx. 1 cm superficial nodule noted to right mid thoracic EYES: normal, Conjunctiva are pink and non-injected, sclera clear OROPHARYNX:no exudate, no erythema and lips, buccal mucosa, and tongue normal  NECK: supple, thyroid normal size, non-tender, without nodularity LYMPH:  no palpable lymphadenopathy in the cervical, axillary or inguinal LUNGS: clear to auscultation and percussion with normal breathing effort HEART: regular rate & rhythm and no murmurs and no lower extremity edema ABDOMEN:abdomen soft, non-tender and normal bowel sounds Musculoskeletal:no cyanosis of digits and no clubbing  NEURO: alert & oriented x 3 with fluent speech, no focal motor/sensory deficits  LABORATORY DATA:  I have reviewed the data as  listed    Component Value Date/Time   NA 138 07/31/2021 0000   K 4.3 07/31/2021 0000   CL 105 07/31/2021 0000   CO2 28 (A) 07/31/2021 0000   BUN 18 07/31/2021 0000   CREATININE 0.9 07/31/2021 0000   CALCIUM 9.1 07/31/2021 0000   ALBUMIN 4.3 07/31/2021 0000   AST 27 07/31/2021 0000   ALT 16 07/31/2021 0000   ALKPHOS 69 07/31/2021 0000    No results found for: SPEP, UPEP  Lab Results  Component Value Date   WBC 5.2 07/31/2021   NEUTROABS 3.28 07/31/2021   HGB 14.0 07/31/2021   HCT 42 07/31/2021   MCV 97 (A) 07/31/2021   PLT 359 07/31/2021      Chemistry      Component Value Date/Time   NA 138 07/31/2021 0000   K 4.3 07/31/2021 0000   CL 105 07/31/2021 0000    CO2 28 (A) 07/31/2021 0000   BUN 18 07/31/2021 0000   CREATININE 0.9 07/31/2021 0000   GLU 133 07/31/2021 0000      Component Value Date/Time   CALCIUM 9.1 07/31/2021 0000   ALKPHOS 69 07/31/2021 0000   AST 27 07/31/2021 0000   ALT 16 07/31/2021 0000       RADIOGRAPHIC STUDIES: I have personally reviewed the radiological images as listed and agreed with the findings in the report. No results found.

## 2021-11-29 ENCOUNTER — Encounter: Payer: Self-pay | Admitting: Hematology and Oncology

## 2021-11-29 ENCOUNTER — Ambulatory Visit: Payer: Medicare Other | Admitting: Hematology and Oncology

## 2021-11-29 DIAGNOSIS — C439 Malignant melanoma of skin, unspecified: Secondary | ICD-10-CM | POA: Diagnosis not present

## 2021-11-29 DIAGNOSIS — R918 Other nonspecific abnormal finding of lung field: Secondary | ICD-10-CM | POA: Diagnosis not present

## 2021-11-29 NOTE — Assessment & Plan Note (Signed)
Maternal family history of breast cancer in addition to his melanoma history which raises suspicion for possible BRCA mutation.  He met with the genetic counselors and [facility] RNA + hereditary cancer panel revealed variants of uncertain significance (VUS) of the APC, CDKN1C and FH genes.

## 2021-11-29 NOTE — Assessment & Plan Note (Signed)
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  There was still a question as to whether this represents a metastatic lesion, rather than a primary, based on the presence of 2 nodules and no epidermal involvement. MRI head and CT chest, abdomen and pelvis in June were negative for metastatic disease.  No adjuvant therapy was recommended.  PET scan in September was negative for metastatic disease. Chest x-ray in June 2022 was negative. He presents today for new area of concern to the left mid back. A superficial nodule is noted approximately 1 cm to the right of his upper thoracic spine. He cannot fully assess the area due to location, but feels like it has increased in size. There is no redness or warmth noted to the area.

## 2021-11-29 NOTE — Assessment & Plan Note (Addendum)
Multiple dysplastic nevi of the right lower back with severe atypia. The nevus of the medial back had positive peripheral margins, so he underwent re-excision of this area as well, which only revealed dermal scar. Dysplastic compound nevus of the left mid buttock with moderate atypia. Margins were free of neoplasm.

## 2021-12-04 ENCOUNTER — Other Ambulatory Visit: Payer: Self-pay | Admitting: Hematology and Oncology

## 2021-12-04 ENCOUNTER — Encounter: Payer: Self-pay | Admitting: Hematology and Oncology

## 2021-12-04 DIAGNOSIS — C439 Malignant melanoma of skin, unspecified: Secondary | ICD-10-CM

## 2021-12-09 ENCOUNTER — Encounter: Payer: Self-pay | Admitting: Hematology and Oncology

## 2021-12-12 ENCOUNTER — Encounter (HOSPITAL_COMMUNITY)
Admission: RE | Admit: 2021-12-12 | Discharge: 2021-12-12 | Disposition: A | Discharge status: Home / Self Care | Payer: Medicare Other | Source: Ambulatory Visit | Attending: Hematology and Oncology | Admitting: Hematology and Oncology

## 2021-12-12 DIAGNOSIS — I898 Other specified noninfective disorders of lymphatic vessels and lymph nodes: Secondary | ICD-10-CM | POA: Insufficient documentation

## 2021-12-12 DIAGNOSIS — K769 Liver disease, unspecified: Secondary | ICD-10-CM | POA: Diagnosis not present

## 2021-12-12 DIAGNOSIS — C439 Malignant melanoma of skin, unspecified: Secondary | ICD-10-CM | POA: Insufficient documentation

## 2021-12-12 DIAGNOSIS — I2510 Atherosclerotic heart disease of native coronary artery without angina pectoris: Secondary | ICD-10-CM | POA: Diagnosis not present

## 2021-12-12 DIAGNOSIS — R918 Other nonspecific abnormal finding of lung field: Secondary | ICD-10-CM | POA: Insufficient documentation

## 2021-12-12 DIAGNOSIS — K7689 Other specified diseases of liver: Secondary | ICD-10-CM | POA: Diagnosis not present

## 2021-12-12 DIAGNOSIS — I700 Atherosclerosis of aorta: Secondary | ICD-10-CM | POA: Diagnosis not present

## 2021-12-12 LAB — GLUCOSE, CAPILLARY: Glucose-Capillary: 95 mg/dL (ref 70–99)

## 2021-12-12 MED ORDER — FLUDEOXYGLUCOSE F - 18 (FDG) INJECTION
8.0200 | Freq: Once | INTRAVENOUS | Status: AC | PRN
  Administered 2021-12-12: 8.02 via INTRAVENOUS

## 2021-12-16 ENCOUNTER — Other Ambulatory Visit: Payer: Self-pay | Admitting: Hematology and Oncology

## 2021-12-16 DIAGNOSIS — C439 Malignant melanoma of skin, unspecified: Secondary | ICD-10-CM

## 2021-12-17 DIAGNOSIS — R222 Localized swelling, mass and lump, trunk: Secondary | ICD-10-CM | POA: Diagnosis not present

## 2021-12-17 DIAGNOSIS — R9389 Abnormal findings on diagnostic imaging of other specified body structures: Secondary | ICD-10-CM | POA: Diagnosis not present

## 2021-12-17 DIAGNOSIS — C439 Malignant melanoma of skin, unspecified: Secondary | ICD-10-CM | POA: Diagnosis not present

## 2021-12-18 ENCOUNTER — Encounter: Payer: Self-pay | Admitting: Hematology and Oncology

## 2021-12-18 DIAGNOSIS — G9389 Other specified disorders of brain: Secondary | ICD-10-CM | POA: Diagnosis not present

## 2021-12-18 DIAGNOSIS — C439 Malignant melanoma of skin, unspecified: Secondary | ICD-10-CM | POA: Diagnosis not present

## 2021-12-18 DIAGNOSIS — C7931 Secondary malignant neoplasm of brain: Secondary | ICD-10-CM | POA: Diagnosis not present

## 2021-12-19 ENCOUNTER — Other Ambulatory Visit: Payer: Self-pay

## 2021-12-19 ENCOUNTER — Inpatient Hospital Stay: Payer: Medicare Other | Attending: Hematology and Oncology | Admitting: Oncology

## 2021-12-19 ENCOUNTER — Encounter: Payer: Self-pay | Admitting: Oncology

## 2021-12-19 ENCOUNTER — Other Ambulatory Visit: Payer: Self-pay | Admitting: Hematology and Oncology

## 2021-12-19 ENCOUNTER — Other Ambulatory Visit: Payer: Self-pay | Admitting: Oncology

## 2021-12-19 ENCOUNTER — Inpatient Hospital Stay: Payer: Medicare Other

## 2021-12-19 DIAGNOSIS — C778 Secondary and unspecified malignant neoplasm of lymph nodes of multiple regions: Secondary | ICD-10-CM | POA: Insufficient documentation

## 2021-12-19 DIAGNOSIS — K769 Liver disease, unspecified: Secondary | ICD-10-CM | POA: Insufficient documentation

## 2021-12-19 DIAGNOSIS — C4361 Malignant melanoma of right upper limb, including shoulder: Secondary | ICD-10-CM | POA: Diagnosis not present

## 2021-12-19 DIAGNOSIS — C7800 Secondary malignant neoplasm of unspecified lung: Secondary | ICD-10-CM | POA: Insufficient documentation

## 2021-12-19 DIAGNOSIS — C7931 Secondary malignant neoplasm of brain: Secondary | ICD-10-CM | POA: Insufficient documentation

## 2021-12-19 DIAGNOSIS — C787 Secondary malignant neoplasm of liver and intrahepatic bile duct: Secondary | ICD-10-CM | POA: Insufficient documentation

## 2021-12-19 DIAGNOSIS — C439 Malignant melanoma of skin, unspecified: Secondary | ICD-10-CM | POA: Diagnosis not present

## 2021-12-19 HISTORY — DX: Secondary malignant neoplasm of brain: C79.31

## 2021-12-19 HISTORY — DX: Secondary malignant neoplasm of liver and intrahepatic bile duct: C78.7

## 2021-12-19 HISTORY — DX: Secondary and unspecified malignant neoplasm of lymph nodes of multiple regions: C77.8

## 2021-12-19 HISTORY — DX: Secondary malignant neoplasm of unspecified lung: C78.00

## 2021-12-19 LAB — BASIC METABOLIC PANEL
BUN: 17 (ref 4–21)
CO2: 26 — AB (ref 13–22)
Chloride: 105 (ref 99–108)
Creatinine: 0.8 (ref 0.6–1.3)
Glucose: 108
Potassium: 3.9 mEq/L (ref 3.5–5.1)
Sodium: 141 (ref 137–147)

## 2021-12-19 LAB — CBC AND DIFFERENTIAL
HCT: 41 (ref 41–53)
Hemoglobin: 13.3 — AB (ref 13.5–17.5)
Neutrophils Absolute: 4.22
Platelets: 353 10*3/uL (ref 150–400)
WBC: 6.4

## 2021-12-19 LAB — HEPATIC FUNCTION PANEL
ALT: 13 U/L (ref 10–40)
AST: 24 (ref 14–40)
Alkaline Phosphatase: 58 (ref 25–125)
Bilirubin, Total: 0.3

## 2021-12-19 LAB — COMPREHENSIVE METABOLIC PANEL WITH GFR
Albumin: 4.3 (ref 3.5–5.0)
Calcium: 9.1 (ref 8.7–10.7)

## 2021-12-19 LAB — LACTATE DEHYDROGENASE: LDH: 145 U/L (ref 98–192)

## 2021-12-19 LAB — CBC: RBC: 4.3 (ref 3.87–5.11)

## 2021-12-19 MED ORDER — DEXAMETHASONE 4 MG PO TABS: 4.0000 mg | ORAL_TABLET | Freq: Three times a day (TID) | ORAL | 3 refills | Status: DC

## 2021-12-19 NOTE — Progress Notes (Deleted)
[facility]  [address] [place],  [place]  [address] [phone]  Clinic Day:  12/19/2021  Referring physician: Earlyne Iba, NP  This document serves as a record of services personally performed by Hosie Poisson, MD. It was created on their behalf by Curry,Lauren E, a trained medical scribe. The creation of this record is based on the scribe's personal observations and the provider's statements to them.  CHIEF COMPLAINT:  CC:  Stage IIB malignant melanoma of the right shoulder  Current Treatment:   Observation   HISTORY OF PRESENT ILLNESS:  Xavier Jones is a 51 y.o. male with stage IIB (T3b N0 M0) malignant melanoma of the right posterior shoulder diagnosed in May 2021.  We began seeing him in June.  He reported an enlarging nevus of the right shoulder for about a year.  It began to bleed, so he saw his dermatologist.  Biopsy revealed malignant melanoma, Breslow's depth 3.3 mm, Clark level IV, however, there was no overt attachment to the overlying epidermis, so a metastatic nodule could not be ruled out.  He had several other lesions removed at the time of biopsy.  Right lower lateral back shave revealed dysplastic compound nevus with severe atypia, limited margins free.  Right lower medial back shave revealed dysplastic nevus with moderate to severe atypia, peripheral margin involved.  Left mid buttocks shave revealed dysplastic compound nevus with moderate atypia, limited margins free.  MRI head and CT chest, abdomen and pelvis in June were negative for metastatic disease. He underwent wide local excision of the right shoulder melanoma with Dr. Coralie Keens in June.  Pathology revealed residual dermal malignant melanoma, but margins were clear.  3 sentinel lymph nodes were negative for metastasis (0/3).  However, there was a question as to whether this represents a metastatic lesion, rather than a primary, based on the presence of 2 nodules and no  epidermal involvement.  He also had excision of the lower back lesion, which did not reveal malignancy. No adjuvant therapy was recommended. PET scan in September did not reveal any findings of hypermetabolic metastatic disease, there was low level activity felt to be postoperative changes in the skin/subcutaneous tissue of the right posterior shoulder, as well as likely degenerative hypermetabolism of the proximal, posterior right femur.  At his visit in November, he had persistent bleeding of the right shoulder incision with fibrosis of the skin, so he was referred back to Dr. Coralie Keens for a biopsy.  This revealed hypertrophic type scar of the skin of the right shoulder. I saw him last month and he had a tiny submandibular lymph node in the left neck, so scheduled him to return in 1 month for re-evaluation.  A chest x-ray and labs at that time were normal. He had additional skin lesions removed earlier this year, which were not melanoma or pre cancer.  Due to his personal and family history of malignancy, he was scheduled for a consultation with the genetic counselor to discuss testing for hereditary cancer syndromes. [facility] testing from August 2022 revealed variants of uncertain significance (VUS) of the APC, CDKN1C and FH gene.  INTERVAL HISTORY:  Xavier Jones is here for routine follow up and states that he is doing well and denies complaints. He does have some mild pain of the right shoulder from his prior surgery on occasion. Blood counts and chemistries are unremarkable. His  appetite is good, and he has gained 3 and 1/2 pounds since his last visit.  He  denies fever, chills or other signs of infection.  He denies nausea, vomiting, bowel issues, or abdominal pain.  He denies sore throat, cough, dyspnea, or chest pain.  REVIEW OF SYSTEMS:  Review of Systems  Constitutional: Negative.  Negative for appetite change, chills, fatigue, fever and unexpected weight change.  HENT:  Negative.    Eyes: Negative.    Respiratory: Negative.  Negative for chest tightness, cough, hemoptysis, shortness of breath and wheezing.   Cardiovascular: Negative.  Negative for chest pain, leg swelling and palpitations.  Gastrointestinal: Negative.  Negative for abdominal distention, abdominal pain, blood in stool, constipation, diarrhea, nausea and vomiting.  Endocrine: Negative.   Genitourinary: Negative.  Negative for difficulty urinating, dysuria, frequency and hematuria.   Musculoskeletal: Negative.  Negative for arthralgias, back pain, flank pain, gait problem and myalgias.  Skin: Negative.   Neurological: Negative.  Negative for dizziness, extremity weakness, gait problem, headaches, light-headedness, numbness, seizures and speech difficulty.  Hematological: Negative.   Psychiatric/Behavioral: Negative.  Negative for depression and sleep disturbance. The patient is not nervous/anxious.   All other systems reviewed and are negative.   VITALS:  There were no vitals taken for this visit.  Wt Readings from Last 3 Encounters:  11/28/21 161 lb 1.6 oz (73.1 kg)  07/31/21 162 lb 11.2 oz (73.8 kg)  01/28/21 159 lb 14.4 oz (72.5 kg)    There is no height or weight on file to calculate BMI.  Performance status (ECOG): 0 - Asymptomatic  PHYSICAL EXAM:  Physical Exam Constitutional:      General: He is not in acute distress.    Appearance: Normal appearance. He is normal weight.  HENT:     Head: Normocephalic and atraumatic.  Eyes:     General: No scleral icterus.    Extraocular Movements: Extraocular movements intact.     Conjunctiva/sclera: Conjunctivae normal.     Pupils: Pupils are equal, round, and reactive to light.  Cardiovascular:     Rate and Rhythm: Normal rate and regular rhythm.     Pulses: Normal pulses.     Heart sounds: Normal heart sounds. No murmur heard.    No friction rub. No gallop.  Pulmonary:     Effort: Pulmonary effort is normal. No respiratory distress.     Breath sounds: Normal  breath sounds.  Abdominal:     General: Bowel sounds are normal. There is no distension.     Palpations: Abdomen is soft. There is no hepatomegaly, splenomegaly or mass.     Tenderness: There is no abdominal tenderness.  Musculoskeletal:        General: Normal range of motion.     Cervical back: Normal range of motion and neck supple.     Right lower leg: No edema.     Left lower leg: No edema.  Lymphadenopathy:     Cervical: No cervical adenopathy.  Skin:    General: Skin is warm and dry.     Comments: Thickened scar of the right posterior shoulder, well healed, no evidence of nodules or skin lesions. Large scar in the right mid back posteriorly, which is well healed. Another scar across the left flank. Tiny 1 cm nodule in the left lateral flank scar which feels benign.  Neurological:     General: No focal deficit present.     Mental Status: He is alert and oriented to person, place, and time. Mental status is at baseline.  Psychiatric:        Mood and Affect: Mood normal.  Behavior: Behavior normal.        Thought Content: Thought content normal.        Judgment: Judgment normal.    LABS:      Latest Ref Rng & Units 07/31/2021   12:00 AM 12/31/2020   12:00 AM 05/30/2020   12:00 AM  CBC  WBC  5.2     5.9  5.0      Hemoglobin 13.5 - 17.5 14.0     13.9  14.1      Hematocrit 41 - 53 42     41  42      Platelets 150 - 399 359     296  330         This result is from an external source.       Latest Ref Rng & Units 07/31/2021   12:00 AM 12/31/2020   12:00 AM 05/30/2020   12:00 AM  CMP  BUN 4 - 21 18     15  17       Creatinine 0.6 - 1.3 0.9     0.8  0.9      Sodium 137 - 147 138     138  140      Potassium 3.4 - 5.3 4.3     4.2  4.4      Chloride 99 - 108 105     102  104      CO2 13 - 22 28     30  29       Calcium 8.7 - 10.7 9.1     9.3  9.8      Alkaline Phos 25 - 125 69     67  64      AST 14 - 40 27     23  26       ALT 10 - 40 16     12  12          This  result is from an external source.     STUDIES:  NM PET Image Initial (PI) Skull Base To Thigh  Result Date: 12/16/2021 CLINICAL DATA:  Follow-up treatment strategy for melanoma. History of melanoma removal right posterior shoulder. EXAM: NUCLEAR MEDICINE PET Skull Base to thigh TECHNIQUE: 8.02 mCi F-18 FDG was injected intravenously. Full-ring PET imaging was performed from the skull base to thigh after the radiotracer. CT data was obtained and used for attenuation correction and anatomic localization. Fasting blood glucose: 95 mg/dl COMPARISON:  Chest CT dated Nov 29, 2021; PET-CT dated March 19, 2020 FINDINGS: Mediastinal blood pool activity: SUV max 2.4 HEAD/NECK: Focal hypermetabolic lesion of the left frontoparietal lobe, SUV max of 22.4, with surrounding decreased FDG uptake. No hypermetabolic cervical lymph nodes. Incidental CT findings: none CHEST: Bilateral hypermetabolic pulmonary nodules, some are increased in size when compared with prior chest CT dated Nov 29, 2021. Reference nodule of the right middle lobe measures 2.3 by 2.0 cm with an SUV max of 9.9, previously measured 1.7 x 2.2 cm. Hypermetabolic lymph nodes seen in the chest. Hypermetabolic subcutaneous soft tissue nodules. Reference nodule located on series 4, image 77 measuring 1.9 x 1.1 cm with SUV max of 20.2, unchanged in size when compared with prior. Incidental CT findings: Mild coronary artery calcifications of the LAD. ABDOMEN/PELVIS: New small low-attenuation lesion of the inferior right lobe of the liver measuring 6 mm on series 4, image 151 with focal hypermetabolic uptake, SUV max of 5.7. New hypermetabolic retroperitoneal and right pelvic lymph  nodes. Hypermetabolic left paraspinal node measuring 8 mm in short axis on series 4, image 150 with SUV max of 12.5. Hypermetabolic node located inferior to the left kidney on series 4, image 160 measuring 4 mm in short axis with SUV max of 10.4. New mildly hypermetabolic right  external iliac lymph node measuring 6 mm in short axis on series 4, image 207 with SUV max of 2.5. Hypermetabolic subcutaneous soft tissue nodules. Reference nodule of the left pelvis measuring 2.3 x 1.8 cm on series 4, image 183 with SUV max of 37. Incidental CT findings: Atherosclerotic disease of the abdominal aorta. SKELETON: No focal hypermetabolic activity to suggest skeletal metastasis. Incidental CT findings: Orthopedic hardware seen at the thoracolumbar junction. IMPRESSION: 1. Focal hypermetabolic lesion of the left frontoparietal lobe, concerning for brain metastasis. Recommend contrast-enhanced brain MRI for further evaluation. 2. Bilateral hypermetabolic pulmonary nodules, some are increased in size when compared with prior chest CT dated Nov 29, 2021, compatible with metastatic disease. 3. Small hypermetabolic subcentimeter liver lesion which is new when compared with prior PET-CT and concerning for liver metastatic disease. 4. New hypermetabolic retroperitoneal and right pelvic lymph nodes, likely due to nodal metastatic disease. 5. Hypermetabolic subcutaneous soft tissue nodules, compatible with metastatic disease. Electronically Signed   By: Yetta Glassman M.D.   On: 12/16/2021 09:48     HISTORY:   Allergies:  Allergies  Allergen Reactions  . Testosterone Itching  . Sulfa Antibiotics Rash    Current Medications: Current Outpatient Medications  Medication Sig Dispense Refill  . acyclovir (ZOVIRAX) 400 MG tablet Take 400 mg by mouth 3 (three) times daily.    Marland Kitchen amitriptyline (ELAVIL) 25 MG tablet Take 25 mg by mouth at bedtime.    . ergocalciferol (VITAMIN D2) 1.25 MG (50000 UT) capsule Take 50,000 Units by mouth once a week.     No current facility-administered medications for this visit.     ASSESSMENT & PLAN:   Assessment:   1. Stage IIB malignant melanoma of the right posterior shoulder, Breslow's depth 3.3 mm, Clark level IV, treated with wide local excision, diagnosed  in May 2021.  There was still a question as to whether this represents a metastatic lesion, rather than a primary, based on the presence of 2 nodules and no epidermal involvement.  MRI head and CT chest, abdomen and pelvis in June were negative for metastatic disease.   No adjuvant therapy was recommended.  PET scan in September was negative for metastatic disease. Chest x-ray in June 2022 was negative. He remains without obvious evidence of recurrence.   2.  Multiple dysplastic nevi of the right lower back with severe atypia.  The nevus of the medial back had positive peripheral margins, so he underwent re-excision of this area as well, which only revealed dermal scar.   3.  Dysplastic compound nevus of the left mid buttock with moderate atypia.  Margins were free of neoplasm.  4.  Maternal family history of breast cancer in addition to his melanoma history which raises suspicion for possible BRCA mutation.  He met with the genetic counselors and [facility] RNA + hereditary cancer panel revealed variants of uncertain significance (VUS) of the APC, CDKN1C and FH genes.    Plan:     He will continue to follow with dermatology. We will plan to see him in 6 months with a CBC, comprehensive metabolic panel and chest x-ray for repeat clinical assessment. After that appointment, we can consider annual follow up since he will  be over 2 years postop. The patient understands the plans discussed today and is in agreement with them.  He knows to contact our office if he develops concerns prior to his next appointment.  I provided 15 minutes of face-to-face time during this this encounter and > 50% was spent counseling as documented under my assessment and plan.    I, Rita Ohara, am acting as scribe for Derwood Kaplan, MD  I have reviewed this report as typed by the medical scribe, and it is complete and accurate.

## 2021-12-19 NOTE — Progress Notes (Signed)
Patient Care Team: Earlyne Iba, NP as PCP - General (Nurse Practitioner) Gatha Mayer, MD as Consulting Physician (Radiation Oncology)  Clinic Day: 12/19/21  Referring physician: Earlyne Iba, NP  ASSESSMENT & PLAN:   Assessment & Plan: Melanoma of skin ([place]) Stage IIB malignant melanoma of the right posterior shoulder, Breslow's depth 3.3 mm, Clark level IV, treated with wide local excision, diagnosed in May 2021.  There was still a question as to whether this represented a metastatic lesion, rather than a primary, based on the presence of 2 nodules and no epidermal involvement.  MRI head and CT chest, abdomen and pelvis in June were negative for metastatic disease.   No adjuvant therapy was recommended.  PET scan in September was negative for metastatic disease. Chest x-ray in June 2022 was negative.   Metastasis to subcutaneous tissue  He presented with a new area of concern to the left mid back in June of this year. A superficial nodule is noted approximately 1 cm to the right of his upper thoracic spine. He cannot fully assess the area due to location, but feels like it has increased in size. There is no redness or warmth noted to the area.  He was sent for biopsy and that was performed on June 13 and confirmed metastatic malignant melanoma.  Melan-A was positive S100 was positive SOX10 was positive and PRAME positive.  We will order BRAF testing and a melanoma panel through [facility].  He has several hypermetabolic subcutaneous nodules seen.  Metastasis to liver He has a small hypermetabolic liver lesion which is new and less than 1 cm but suspicious for metastasis.  Metastasis to lymph nodes He has multiple hypermetabolic nodes of the retroperitoneal and right pelvic area as well as right external iliac and a few additional nodes.  Pulmonary metastases He has bilateral hypermetabolic pulmonary nodules which are increased in size compared with the CT chest performed in May of  this year.  The largest nodule is up to 2.3 cm in diameter.  Metastases to brain This was suspicious on the PET scan in the left frontoparietal lobe and so he had an MRI of the brain.  This confirmed a 2.6 cm heterogeneous lesion of the left superior temporal gyrus with mild enhancement and extensive surrounding edema.  He also has an 8 mm cortical lesion of the inferomedial right frontal lobe with mild edema, a 2 mm mildly enhancing cortical lesion of the inferolateral left frontal lobe and a intrinsically T1 hyperintense lesion of the left lateral pons measuring 7 mm with surrounding edema.  He has 4 lesions in total but 3 are less than 1 cm.  I will place him on dexamethasone in view of the surrounding edema.  I have referred him to Dr. Gatha Mayer but as we discussed at tumor conference, most likely he will refer him for stereotactic radiation.   Multiple dysplastic nevi Multiple dysplastic nevi of the right lower back with severe atypia.  The nevus of the medial back had positive peripheral margins, so he underwent re-excision of this area as well, which only revealed dermal scar. Dysplastic compound nevus of the left mid buttock with moderate atypia.  Margins were free of neoplasm.     Family history of breast cancer Maternal family history of breast cancer in addition to his melanoma history which raises suspicion for possible BRCA mutation.  He met with the genetic counselors and [facility] RNA + hereditary cancer panel revealed variants of uncertain significance (VUS) of the  APC, CDKN1C and FH genes.     I met with the patient today and explained that he has recurrent disease which is now metastatic to brain, lung, probably liver.  He has some mild expressive dysphasia and some nausea, but is otherwise asymptomatic.  I will start him on dexamethasone 4 mg 3 times daily but we will plan to rapidly taper that.  I will refer him to Dr. Gatha Mayer for consideration of radiation.  We discussed the  possibility of whole brain radiation versus stereotactic technique and I will await the opinion of radiation oncology.  I did also explain that I would recommend immunotherapy, most likely with a combination of ipilimumab and nivolumab for 4 cycles followed by maintenance nivolumab.  Most likely we will recommend a port placement although his veins are fairly good at this time.  I have answered his questions and reviewed this information in detail.The patient understands the plans discussed today and is in agreement with them.  He knows to contact our office if he develops concerns prior to his next appointment.    Derwood Kaplan, MD  [facility] [address] [place] [address] Dept: [phone] Dept Fax: [phone]   Orders Placed This Encounter  Procedures   CBC and differential    This external order was created through the Results Console.   CBC    This external order was created through the Results Console.   Basic metabolic panel    This external order was created through the Results Console.   Comprehensive metabolic panel    This external order was created through the Results Console.   Hepatic function panel    This external order was created through the Results Console.      CHIEF COMPLAINT:  CC: Metastatic melanoma  Current Treatment: Corticosteroids and brain irradiation, followed by immunotherapy  INTERVAL HISTORY:  Xavier Jones is here today for repeat clinical assessment.  He had presented in May with a new nodule of his back in the subcutaneous tissue and biopsy confirmed that this is metastatic malignant melanoma.  He has now had staging and CT of the chest confirms multiple bilateral pulmonary nodules as well as for subcutaneous nodules of the back and at least 20 pulmonary nodules.  He was then sent for a PET scan which revealed additional metastatic disease including a suspicious area of the left  frontoparietal lobe.  He also has a small liver lesion measuring 6 mm but with hypermetabolic uptake and so suspicious for metastatic disease.  He has adenopathy of the retroperitoneum and right pelvic nodes which are also hypermetabolic in addition to a node inferior to the left kidney, right external iliac node, and left paraspinal node.  Multiple hypermetabolic subcutaneous soft tissue nodules are confirmed to be present with one of the left pelvis measuring 2.3 cm.  He was then sent for an MRI of the brain which confirmed for lesions.  The main 1 was a 2.6 cm heterogeneous left superior temporal gyrus lesion with mild enhancement and extensive surrounding edema.  He also has a 7 mm lesion at the left lateral pons, a mildly enhancing 8 mm lesion of the inferomedial right frontal lobe and a 2 mm enhancing cortical lesion of the inferolateral left frontal lobe.  His only symptom is occasional expressive aphasia and difficulty with word finding he does have some anxiety but denies depression.  He has had some nausea and has chronic back and  shoulder pain.  He lives alone and does not have close family but his boss has been very helpful to him.  CBC reveals a mild anemia with a hemoglobin of 13.3 and MCV of 96.  Comprehensive metabolic profile is normal. He denies fevers or chills. He denies pain. His appetite is good. His weight has increased 2 pounds.  I have reviewed the past medical history, past surgical history, social history and family history with the patient and they are unchanged from previous note.  ALLERGIES:  is allergic to testosterone and sulfa antibiotics.  MEDICATIONS:  Current Outpatient Medications  Medication Sig Dispense Refill   acyclovir (ZOVIRAX) 400 MG tablet Take 400 mg by mouth daily.     amitriptyline (ELAVIL) 25 MG tablet Take 50 mg by mouth at bedtime.     dexamethasone (DECADRON) 4 MG tablet Take 1 tablet (4 mg total) by mouth 3 (three) times daily. 90 tablet 3    ergocalciferol (VITAMIN D2) 1.25 MG (50000 UT) capsule Take 50,000 Units by mouth every Wednesday.     No current facility-administered medications for this visit.    HISTORY OF PRESENT ILLNESS:   Oncology History  Melanoma of skin ([place])  12/01/2019 Initial Diagnosis   Melanoma of skin ([place])   12/05/2019 Cancer Staging   Staging form: Melanoma of the Skin, AJCC 8th Edition - Clinical stage from 12/05/2019: Stage IIA (cT3a, cN0, cM0) - Signed by Derwood Kaplan, MD on 05/30/2020   02/18/2021 Genetic Testing   No pathogenic variants detected in [facility] Multi-Cancer +RNA Panel.  Variants of uncertain significance detected in APC (c.681C>G (p.Asp227Glu)), CDKN1C (c.610C>G (p.Pro204Ala)), and FH (c.1151C>T (p.Ala384Val)).  The report date is February 18, 2021.    The Multi-Cancer + RNA Panel offered by [facility] includes sequencing and/or deletion/duplication analysis of the following 84 genes:  AIP*, ALK, APC*, ATM*, AXIN2*, BAP1*, BARD1*, BLM*, BMPR1A*, BRCA1*, BRCA2*, BRIP1*, CASR, CDC73*, CDH1*, CDK4, CDKN1B*, CDKN1C*, CDKN2A, CEBPA, CHEK2*, CTNNA1*, DICER1*, DIS3L2*, EGFR, EPCAM, FH*, FLCN*, GATA2*, GPC3, GREM1, HOXB13, HRAS, KIT, MAX*, MEN1*, MET, MITF, MLH1*, MSH2*, MSH3*, MSH6*, MUTYH*, NBN*, NF1*, NF2*, NTHL1*, PALB2*, PDGFRA, PHOX2B, PMS2*, POLD1*, POLE*, POT1*, PRKAR1A*, PTCH1*, PTEN*, RAD50*, RAD51C*, RAD51D*, RB1*, RECQL4, RET, RUNX1*, SDHA*, SDHAF2*, SDHB*, SDHC*, SDHD*, SMAD4*, SMARCA4*, SMARCB1*, SMARCE1*, STK11*, SUFU*, TERC, TERT, TMEM127*, Tp53*, TSC1*, TSC2*, VHL*, WRN*, and WT1.  RNA analysis is performed for * genes.       REVIEW OF SYSTEMS:   Constitutional: Denies fevers, chills or abnormal weight loss Eyes: Denies blurriness of vision Ears, nose, mouth, throat, and face: Denies mucositis or sore throat Respiratory: Denies cough, dyspnea or wheezes Cardiovascular: Denies palpitation, chest discomfort or lower extremity swelling Gastrointestinal:  Denies nausea,  heartburn or change in bowel habits Skin: Denies abnormal skin rashes Lymphatics: Denies new lymphadenopathy or easy bruising Neurological:Denies numbness, tingling or new weaknesses Behavioral/Psych: Mood is stable, no new changes  All other systems were reviewed with the patient and are negative.   VITALS:  Blood pressure 134/87, pulse (!) 105, temperature 98.3 F (36.8 C), temperature source Oral, resp. rate 18, height 5' 8.7" (1.745 m), weight 163 lb 12.8 oz (74.3 kg), SpO2 97 %.  Wt Readings from Last 3 Encounters:  12/24/21 165 lb 9.6 oz (75.1 kg)  12/19/21 163 lb 12.8 oz (74.3 kg)  11/28/21 161 lb 1.6 oz (73.1 kg)    Body mass index is 24.4 kg/m.  Performance status (ECOG): 1 - Symptomatic but completely ambulatory  PHYSICAL EXAM:   GENERAL:alert, no distress and comfortable  SKIN: skin color, texture, turgor are normal, no rashes or significant lesions.  Approx. 1 cm superficial nodule noted to right mid thoracic He has an additional nodule of the left flank which is smaller but firm. EYES: normal, Conjunctiva are pink and non-injected, sclera clear OROPHARYNX:no exudate, no erythema and lips, buccal mucosa, and tongue normal  NECK: supple, thyroid normal size, non-tender, without nodularity LYMPH:  no palpable lymphadenopathy in the cervical, axillary or inguinal LUNGS: clear to auscultation and percussion with normal breathing effort HEART: regular rate & rhythm with tachycardia, and no lower extremity edema ABDOMEN:abdomen soft, non-tender and normal bowel sounds Musculoskeletal:no cyanosis of digits and no clubbing  NEURO: alert & oriented x 3 with fluent speech, no focal motor/sensory deficits  LABORATORY DATA:  I have reviewed the data as listed    Component Value Date/Time   NA 141 12/19/2021 0000   K 3.9 12/19/2021 0000   CL 105 12/19/2021 0000   CO2 26 (A) 12/19/2021 0000   BUN 17 12/19/2021 0000   CREATININE 0.8 12/19/2021 0000   CALCIUM 9.1 12/19/2021  0000   ALBUMIN 4.3 12/19/2021 0000   AST 24 12/19/2021 0000   ALT 13 12/19/2021 0000   ALKPHOS 58 12/19/2021 0000    No results found for: "SPEP", "UPEP"  Lab Results  Component Value Date   WBC 6.4 12/19/2021   NEUTROABS 4.22 12/19/2021   HGB 13.3 (A) 12/19/2021   HCT 41 12/19/2021   MCV 97 (A) 07/31/2021   PLT 353 12/19/2021      Chemistry      Component Value Date/Time   NA 141 12/19/2021 0000   K 3.9 12/19/2021 0000   CL 105 12/19/2021 0000   CO2 26 (A) 12/19/2021 0000   BUN 17 12/19/2021 0000   CREATININE 0.8 12/19/2021 0000   GLU 108 12/19/2021 0000      Component Value Date/Time   CALCIUM 9.1 12/19/2021 0000   ALKPHOS 58 12/19/2021 0000   AST 24 12/19/2021 0000   ALT 13 12/19/2021 0000       RADIOGRAPHIC STUDIES: I have personally reviewed the radiological images as listed and agreed with the findings in the report. NM PET Image Initial (PI) Skull Base To Thigh  Result Date: 12/16/2021 CLINICAL DATA:  Follow-up treatment strategy for melanoma. History of melanoma removal right posterior shoulder. EXAM: NUCLEAR MEDICINE PET Skull Base to thigh TECHNIQUE: 8.02 mCi F-18 FDG was injected intravenously. Full-ring PET imaging was performed from the skull base to thigh after the radiotracer. CT data was obtained and used for attenuation correction and anatomic localization. Fasting blood glucose: 95 mg/dl COMPARISON:  Chest CT dated Nov 29, 2021; PET-CT dated March 19, 2020 FINDINGS: Mediastinal blood pool activity: SUV max 2.4 HEAD/NECK: Focal hypermetabolic lesion of the left frontoparietal lobe, SUV max of 22.4, with surrounding decreased FDG uptake. No hypermetabolic cervical lymph nodes. Incidental CT findings: none CHEST: Bilateral hypermetabolic pulmonary nodules, some are increased in size when compared with prior chest CT dated Nov 29, 2021. Reference nodule of the right middle lobe measures 2.3 by 2.0 cm with an SUV max of 9.9, previously measured 1.7 x 2.2  cm. Hypermetabolic lymph nodes seen in the chest. Hypermetabolic subcutaneous soft tissue nodules. Reference nodule located on series 4, image 77 measuring 1.9 x 1.1 cm with SUV max of 20.2, unchanged in size when compared with prior. Incidental CT findings: Mild coronary artery calcifications of the LAD. ABDOMEN/PELVIS: New small low-attenuation lesion of the inferior right lobe of  the liver measuring 6 mm on series 4, image 151 with focal hypermetabolic uptake, SUV max of 5.7. New hypermetabolic retroperitoneal and right pelvic lymph nodes. Hypermetabolic left paraspinal node measuring 8 mm in short axis on series 4, image 150 with SUV max of 12.5. Hypermetabolic node located inferior to the left kidney on series 4, image 160 measuring 4 mm in short axis with SUV max of 10.4. New mildly hypermetabolic right external iliac lymph node measuring 6 mm in short axis on series 4, image 207 with SUV max of 2.5. Hypermetabolic subcutaneous soft tissue nodules. Reference nodule of the left pelvis measuring 2.3 x 1.8 cm on series 4, image 183 with SUV max of 37. Incidental CT findings: Atherosclerotic disease of the abdominal aorta. SKELETON: No focal hypermetabolic activity to suggest skeletal metastasis. Incidental CT findings: Orthopedic hardware seen at the thoracolumbar junction. IMPRESSION: 1. Focal hypermetabolic lesion of the left frontoparietal lobe, concerning for brain metastasis. Recommend contrast-enhanced brain MRI for further evaluation. 2. Bilateral hypermetabolic pulmonary nodules, some are increased in size when compared with prior chest CT dated Nov 29, 2021, compatible with metastatic disease. 3. Small hypermetabolic subcentimeter liver lesion which is new when compared with prior PET-CT and concerning for liver metastatic disease. 4. New hypermetabolic retroperitoneal and right pelvic lymph nodes, likely due to nodal metastatic disease. 5. Hypermetabolic subcutaneous soft tissue nodules, compatible with  metastatic disease. Electronically Signed   By: Yetta Glassman M.D.   On: 12/16/2021 09:48

## 2021-12-20 ENCOUNTER — Ambulatory Visit: Payer: Medicare Other | Admitting: Oncology

## 2021-12-20 ENCOUNTER — Other Ambulatory Visit: Payer: Self-pay | Admitting: Radiation Therapy

## 2021-12-20 ENCOUNTER — Telehealth: Payer: Self-pay | Admitting: Radiation Therapy

## 2021-12-20 DIAGNOSIS — C7801 Secondary malignant neoplasm of right lung: Secondary | ICD-10-CM | POA: Diagnosis not present

## 2021-12-20 DIAGNOSIS — C7931 Secondary malignant neoplasm of brain: Secondary | ICD-10-CM

## 2021-12-20 DIAGNOSIS — C7802 Secondary malignant neoplasm of left lung: Secondary | ICD-10-CM | POA: Diagnosis not present

## 2021-12-20 DIAGNOSIS — C4361 Malignant melanoma of right upper limb, including shoulder: Secondary | ICD-10-CM | POA: Diagnosis not present

## 2021-12-20 NOTE — Telephone Encounter (Signed)
I received a call from Dr. Orlene Erm requesting a visit for this patient to see Dr. Tammi Klippel to discuss [facility] treatment options. I spoke with Xavier Jones and have scheduled him to see Dr. Tammi Klippel on Tuesday 6/20. We will review his recent brain MRI in our upcoming conference on Monday 6/19. I will go ahead and start working on the treatment planning appointments.  Mont Dutton R.T.(R)(T) Radiation Special Procedures Navigator

## 2021-12-23 ENCOUNTER — Inpatient Hospital Stay: Payer: Medicare Other

## 2021-12-23 NOTE — Progress Notes (Signed)
Location/Histology of Brain Tumor:  Malignant Melanoma metastatic to brain  Associate Dx:  Stage IIB malignant melanoma of the right shoulder  Associated Dx:  Malignant Melanoma metastatic to lung  12/12/2021 NM PET Image Initial (PI) Skull Base to Thigh  FINDINGS: Mediastinal blood pool activity: SUV max 2.4   HEAD/NECK: Focal hypermetabolic lesion of the left frontoparietal lobe, SUV max of 22.4, with surrounding decreased FDG uptake. No hypermetabolic cervical lymph nodes.   Incidental CT findings: none   CHEST: Bilateral hypermetabolic pulmonary nodules, some are increased in size when compared with prior chest CT dated Nov 29, 2021. Reference nodule of the right middle lobe measures 2.3 by 2.0 cm with an SUV max of 9.9, previously measured 1.7 x 2.2 cm. Hypermetabolic lymph nodes seen in the chest. Hypermetabolic subcutaneous soft tissue nodules. Reference nodule located on series 4, image 77 measuring 1.9 x 1.1 cm with SUV max of 20.2, unchanged in size when compared with prior.   Incidental CT findings: Mild coronary artery calcifications of the LAD.   ABDOMEN/PELVIS: New small low-attenuation lesion of the inferior right lobe of the liver measuring 6 mm on series 4, image 151 with focal hypermetabolic uptake, SUV max of 5.7. New hypermetabolic retroperitoneal and right pelvic lymph nodes. Hypermetabolic left paraspinal node measuring 8 mm in short axis on series 4, image 150 with SUV max of 12.5. Hypermetabolic node located inferior to the left kidney on series 4, image 160 measuring 4 mm in short axis with SUV max of 10.4. New mildly hypermetabolic right external iliac lymph node measuring 6 mm in short axis on series 4, image 207 with SUV max of 2.5. Hypermetabolic subcutaneous soft tissue nodules.  Reference nodule of the left pelvis measuring 2.3 x 1.8 cm on series 4, image 183 with SUV max of 37.   Incidental CT findings: Atherosclerotic disease of the abdominal aorta.    SKELETON: No focal hypermetabolic activity to suggest skeletal metastasis.   Incidental CT findings: Orthopedic hardware seen at the thoracolumbar junction.   IMPRESSION: 1. Focal hypermetabolic lesion of the left frontoparietal lobe, concerning for brain metastasis. Recommend contrast-enhanced brain MRI for further evaluation. 2. Bilateral hypermetabolic pulmonary nodules, some are increased in size when compared with prior chest CT dated Nov 29, 2021, compatible with metastatic disease. 3. Small hypermetabolic subcentimeter liver lesion which is new when compared with prior PET-CT and concerning for liver metastatic disease. 4. New hypermetabolic retroperitoneal and right pelvic lymph nodes, likely due to nodal metastatic disease. 5. Hypermetabolic subcutaneous soft tissue nodules, compatible with metastatic disease.   Past or anticipated interventions, if any, per neurosurgery: NA  Past or anticipated interventions, if any, per medical oncology: NA  Dose of Decadron, if applicable: Decadron 4 mg three times daily.  Recent neurologic symptoms, if any:  Seizures: {:18581} Headaches: {:18581} Nausea: {:18581} Dizziness/ataxia: {:18581} Difficulty with hand coordination: {:18581} Focal numbness/weakness: {:18581} Visual deficits/changes: {:18581} Confusion/Memory deficits: {:18581}  Painful bone metastases at present, if any: {:18581}  SAFETY ISSUES: Prior radiation? {:18581} Pacemaker/ICD? {:18581} Possible current pregnancy? Male Is the patient on methotrexate? No  Additional Complaints / other details:

## 2021-12-24 ENCOUNTER — Ambulatory Visit
Admission: RE | Admit: 2021-12-24 | Discharge: 2021-12-24 | Disposition: A | Discharge status: Home / Self Care | Payer: Medicare Other | Source: Ambulatory Visit | Attending: Radiation Oncology | Admitting: Radiation Oncology

## 2021-12-24 ENCOUNTER — Other Ambulatory Visit: Payer: Self-pay

## 2021-12-24 VITALS — BP 130/85 | HR 100 | Temp 97.8°F | Resp 18 | Ht 69.0 in | Wt 165.6 lb

## 2021-12-24 DIAGNOSIS — Z803 Family history of malignant neoplasm of breast: Secondary | ICD-10-CM | POA: Insufficient documentation

## 2021-12-24 DIAGNOSIS — Z87891 Personal history of nicotine dependence: Secondary | ICD-10-CM | POA: Diagnosis not present

## 2021-12-24 DIAGNOSIS — Z8041 Family history of malignant neoplasm of ovary: Secondary | ICD-10-CM | POA: Insufficient documentation

## 2021-12-24 DIAGNOSIS — C4911 Malignant neoplasm of connective and soft tissue of right upper limb, including shoulder: Secondary | ICD-10-CM | POA: Insufficient documentation

## 2021-12-24 DIAGNOSIS — C7931 Secondary malignant neoplasm of brain: Secondary | ICD-10-CM

## 2021-12-24 DIAGNOSIS — R918 Other nonspecific abnormal finding of lung field: Secondary | ICD-10-CM | POA: Diagnosis not present

## 2021-12-24 DIAGNOSIS — C4360 Malignant melanoma of unspecified upper limb, including shoulder: Secondary | ICD-10-CM | POA: Diagnosis not present

## 2021-12-24 DIAGNOSIS — Z7952 Long term (current) use of systemic steroids: Secondary | ICD-10-CM | POA: Insufficient documentation

## 2021-12-24 NOTE — Progress Notes (Incomplete)
Radiation Oncology         [phone] ________________________________  Initial outpatient Consultation  Name: Xavier Jones MRN: [id number]  Date of Service: 12/24/2021 DOB: 1971/06/22  AO:ZHYQM, Georgeann Oppenheim, NP  Gatha Mayer, MD   REFERRING PHYSICIAN: Gatha Mayer, MD  DIAGNOSIS: 51 year old male with brain metastases secondary to metastatic melanoma.    ICD-10-CM   1. Malignant melanoma metastatic to brain [facility])  C79.31       HISTORY OF PRESENT ILLNESS: [name] is a 51 y.o. male seen at the request of Dr. Orlene Erm. He was initially diagnosed with stage IIB malignant melanoma in 11/2019 at the right posterior shoulder, treated with wide local excision under the care of Dr. Lovie Macadamia in May 2021. Staging CT C/A/P and head MRI in 12/2019 and PET scan in 03/2020 were all negative for metastatic disease so he has been followed in observation since that time under the care of of his medical oncologist, Dr. Hinton Rao, in [place]. More recently, he presented to his medical oncologist on 11/28/21 with a new knot on his mid left back that he felt was getting larger.  A CT chest was performed on 11/29/2021 for further evaluation and demonstrated four subcutaneous nodules in the mid-upper back as well as at least 20 pulmonary nodules, involving all lobes of both lungs. This prompted a PET scan which was performed on 12/12/21 confirming hypermetabolic subcutaneous soft tissue nodules.  Additionally, there was a focal hypermetabolic lesion in the left frontoparietal lobe, bilateral hypermetabolic pulmonary nodules, some increased in size from recent chest CT, a new, small hypermetabolic subcentimeter liver lesion and hypermetabolic retroperitoneal and right pelvic lymph nodes. He underwent biopsy of one of the subcutaneous nodules on 12/17/21 which confirmed metastatic melanoma  A brain MRI was performed on 12/18/21 for further evaluation of the focal hypermetabolic lesion in the left frontoparietal  lobe and this confirmed four metastatic lesions, with the largest measuring 2.6 cm within the left temporal lobe with extensive edema causing regional mass effect.there was also a 7 mm lesion in the left lateral pons, an 8 mm lesion in the inferior right frontal lobe and a 2 mm left frontoparietal lobe lesion.  Some of the lesions had associated susceptibility, likely reflecting intralesional hemorrhage; intrinsic T1 hyperintensity associated with left pontine lesion may reflect hemorrhage or melanin. He was started on dexamethasone 4 mg p.o. 3 times daily which he has continued taking as prescribed.  He has been kindly referred to [place] today for discussion of possible stereotactic radiation therapy to the brain metastases.  PREVIOUS RADIATION THERAPY: No  PAST MEDICAL HISTORY:  Past Medical History:  Diagnosis Date   Depression    mild   Family history of breast cancer 01/22/2021   Family history of prostate cancer 01/22/2021   Genital herpes    History of back injury 2003   Skin cancer    melanoma      PAST SURGICAL HISTORY: Past Surgical History:  Procedure Laterality Date   SPINAL FUSION W/ LUQUE UNIT ROD  2003    FAMILY HISTORY:  Family History  Problem Relation Age of Onset   Prostate cancer Father        dx 42s   Breast cancer Maternal Aunt        dx after 44; bilateral mastectomy   Stomach cancer Maternal Grandfather        dx 45s   Breast cancer Cousin 25       maternal male cousin; mets  SOCIAL HISTORY:  Social History   Socioeconomic History   Marital status: Divorced    Spouse name: Not on file   Number of children: Not on file   Years of education: Not on file   Highest education level: Not on file  Occupational History   Not on file  Tobacco Use   Smoking status: Former    Types: Cigarettes   Smokeless tobacco: Never  Substance and Sexual Activity   Alcohol use: Not Currently   Drug use: Not Currently   Sexual activity: Not on file  Other  Topics Concern   Not on file  Social History Narrative   Not on file   Social Determinants of Health   Financial Resource Strain: Not on file  Food Insecurity: Not on file  Transportation Needs: Not on file  Physical Activity: Not on file  Stress: Not on file  Social Connections: Not on file  Intimate Partner Violence: Not on file    ALLERGIES: Testosterone and Sulfa antibiotics  MEDICATIONS:  Current Outpatient Medications  Medication Sig Dispense Refill   acyclovir (ZOVIRAX) 400 MG tablet Take 400 mg by mouth 3 (three) times daily.     amitriptyline (ELAVIL) 25 MG tablet Take 25 mg by mouth at bedtime.     dexamethasone (DECADRON) 4 MG tablet Take 1 tablet (4 mg total) by mouth 3 (three) times daily. 90 tablet 3   ergocalciferol (VITAMIN D2) 1.25 MG (50000 UT) capsule Take 50,000 Units by mouth once a week.     No current facility-administered medications for this encounter.    REVIEW OF SYSTEMS:  On review of systems, the patient reports that he is doing well overall. He denies any chest pain, shortness of breath, cough, fevers, chills, night sweats, unintended weight changes. He denies any bowel or bladder disturbances, and denies abdominal pain, nausea or vomiting. He reports mild headaches, rated 1/10, slight blurring of vision bilaterally, and confusion/memory deficits. A complete review of systems is obtained and is otherwise negative.    PHYSICAL EXAM:  Wt Readings from Last 3 Encounters:  12/24/21 165 lb 9.6 oz (75.1 kg)  12/19/21 163 lb 12.8 oz (74.3 kg)  11/28/21 161 lb 1.6 oz (73.1 kg)   Temp Readings from Last 3 Encounters:  12/24/21 97.8 F (36.6 C) (Temporal)  12/19/21 98.3 F (36.8 C) (Oral)  11/28/21 98.2 F (36.8 C) (Oral)   BP Readings from Last 3 Encounters:  12/24/21 130/85  12/19/21 134/87  11/28/21 137/84   Pulse Readings from Last 3 Encounters:  12/24/21 100  12/19/21 (!) 105  11/28/21 85   Pain Assessment Pain Score: 0-No  pain/10  In general this is a well appearing Caucasian male in no acute distress.  He's alert and oriented x4 and appropriate throughout the examination. Cardiopulmonary assessment is negative for acute distress and he exhibits normal effort.   KPS = 100  100 - Normal; no complaints; no evidence of disease. 90   - Able to carry on normal activity; minor signs or symptoms of disease. 80   - Normal activity with effort; some signs or symptoms of disease. 92   - Cares for self; unable to carry on normal activity or to do active work. 60   - Requires occasional assistance, but is able to care for most of his personal needs. 50   - Requires considerable assistance and frequent medical care. 34   - Disabled; requires special care and assistance. 30   - Severely disabled; hospital admission  is indicated although death not imminent. 56   - Very sick; hospital admission necessary; active supportive treatment necessary. 10   - Moribund; fatal processes progressing rapidly. 0     - Dead  Karnofsky DA, Abelmann [place], Craver LS and Burchenal [facility] [phone]) The use of the nitrogen mustards in the palliative treatment of carcinoma: with particular reference to bronchogenic carcinoma Cancer 1 634-56  LABORATORY DATA:  Lab Results  Component Value Date   WBC 6.4 12/19/2021   HGB 13.3 (A) 12/19/2021   HCT 41 12/19/2021   MCV 97 (A) 07/31/2021   PLT 353 12/19/2021   Lab Results  Component Value Date   NA 141 12/19/2021   K 3.9 12/19/2021   CL 105 12/19/2021   CO2 26 (A) 12/19/2021   Lab Results  Component Value Date   ALT 13 12/19/2021   AST 24 12/19/2021   ALKPHOS 58 12/19/2021     RADIOGRAPHY: NM PET Image Initial (PI) Skull Base To Thigh  Result Date: 12/16/2021 CLINICAL DATA:  Follow-up treatment strategy for melanoma. History of melanoma removal right posterior shoulder. EXAM: NUCLEAR MEDICINE PET Skull Base to thigh TECHNIQUE: 8.02 mCi F-18 FDG was injected intravenously. Full-ring PET  imaging was performed from the skull base to thigh after the radiotracer. CT data was obtained and used for attenuation correction and anatomic localization. Fasting blood glucose: 95 mg/dl COMPARISON:  Chest CT dated Nov 29, 2021; PET-CT dated March 19, 2020 FINDINGS: Mediastinal blood pool activity: SUV max 2.4 HEAD/NECK: Focal hypermetabolic lesion of the left frontoparietal lobe, SUV max of 22.4, with surrounding decreased FDG uptake. No hypermetabolic cervical lymph nodes. Incidental CT findings: none CHEST: Bilateral hypermetabolic pulmonary nodules, some are increased in size when compared with prior chest CT dated Nov 29, 2021. Reference nodule of the right middle lobe measures 2.3 by 2.0 cm with an SUV max of 9.9, previously measured 1.7 x 2.2 cm. Hypermetabolic lymph nodes seen in the chest. Hypermetabolic subcutaneous soft tissue nodules. Reference nodule located on series 4, image 77 measuring 1.9 x 1.1 cm with SUV max of 20.2, unchanged in size when compared with prior. Incidental CT findings: Mild coronary artery calcifications of the LAD. ABDOMEN/PELVIS: New small low-attenuation lesion of the inferior right lobe of the liver measuring 6 mm on series 4, image 151 with focal hypermetabolic uptake, SUV max of 5.7. New hypermetabolic retroperitoneal and right pelvic lymph nodes. Hypermetabolic left paraspinal node measuring 8 mm in short axis on series 4, image 150 with SUV max of 12.5. Hypermetabolic node located inferior to the left kidney on series 4, image 160 measuring 4 mm in short axis with SUV max of 10.4. New mildly hypermetabolic right external iliac lymph node measuring 6 mm in short axis on series 4, image 207 with SUV max of 2.5. Hypermetabolic subcutaneous soft tissue nodules. Reference nodule of the left pelvis measuring 2.3 x 1.8 cm on series 4, image 183 with SUV max of 37. Incidental CT findings: Atherosclerotic disease of the abdominal aorta. SKELETON: No focal hypermetabolic  activity to suggest skeletal metastasis. Incidental CT findings: Orthopedic hardware seen at the thoracolumbar junction. IMPRESSION: 1. Focal hypermetabolic lesion of the left frontoparietal lobe, concerning for brain metastasis. Recommend contrast-enhanced brain MRI for further evaluation. 2. Bilateral hypermetabolic pulmonary nodules, some are increased in size when compared with prior chest CT dated Nov 29, 2021, compatible with metastatic disease. 3. Small hypermetabolic subcentimeter liver lesion which is new when compared with prior PET-CT and concerning for liver metastatic  disease. 4. New hypermetabolic retroperitoneal and right pelvic lymph nodes, likely due to nodal metastatic disease. 5. Hypermetabolic subcutaneous soft tissue nodules, compatible with metastatic disease. Electronically Signed   By: Yetta Glassman M.D.   On: 12/16/2021 09:48      IMPRESSION/PLAN: 1. 51 y.o. man with 4 brain metastases secondary to metastatic melanoma.  Today, we talked to the patient about the findings and workup thus far. At this point, the patient would potentially benefit from radiotherapy.  Given the radioresistant nature of malignant melanoma, the recommendation is for stereotactic radiotherapy.  His case was reviewed at the recent multidisciplinary brain conference and consensus recommendation was for preoperative stereotactic radiosurgery [facility]) to treat all 4 lesions, followed by surgical resection of the largest lesion in the left superior temporal gyrus.  We discussed that stereotactic radiosurgery carries a high likelihood for local tumor control at the targeted sites with lower associated risk for neurocognitive changes such as memory loss. However, the use of stereotactic radiosurgery in this setting may leave the patient at increased risk for new brain metastases elsewhere in the brain as high as 50-60%. Accordingly, patients who receive stereotactic radiosurgery in this setting should undergo ongoing  surveillance imaging with brain MRI more frequently in order to identify and treat new small brain metastases before they become symptomatic. Stereotactic radiosurgery does carry some different risks, including a risk of radionecrosis.  We discussed the pros and cons and acute and late side effects associated with treatment in this setting as well as the logistics and delivery. We reviewed the results associated with treatment and the patient seems to understand the treatment options and would like to proceed with stereotactic radiosurgery. He was encouraged to ask questions that were answered to his stated satisfaction.  In terms of timing of the stereotactic radiosurgery, evidence suggests that risk of radionecrosis and leptomeningeal recurrence is lower when used in the pre-operative setting as opposed to post-operative SRS.*  We will coordinate for stereotactic radiosurgery with Dr. Kathyrn Sheriff. Tentatively, the patient will be set up for [facility] CT Simulation on 12/27/21, with treatment on 01/02/22, and Surgical resection on 01/03/22.  We enjoyed meeting him and look forward to continuing to participate in his care.  We personally spent 70 minutes in this encounter including chart review, reviewing radiological studies, meeting face-to-face with the patient, entering orders and completing documentation.    Nicholos Johns, PA-C    Tyler Pita, MD  [facility] Direct Dial: [phone]  Fax: [phone] [email or web address]  Skype  LinkedIn   *References:  1: Patchell RA, Tibbs PA, Helene Shoe, [address] RJ, [place], Guy Sandifer JS, [place] B. A randomized trial of surgery in the treatment of single metastases to the brain. [place] Feb 22;322(8):494-500. PubMed PMID: 6387564.   2: Patchell RA, Tibbs PA, Regine WF, Jonita Albee, Mohiuddin M, Arrie Eastern, [place], [place], [place] B. Postoperative radiotherapy in the treatment of single  metastases to the brain: a randomized trial. JAMA. 1998 Nov 4;280(17):1485-9. PubMed PMID: 3329518.   3: Erlene Senters, Wenda Low, Hess KR, Tomie China, Lang FF, Kornguth DG, Secretary, Swint JM, Shiu AS, Maor MH, Hancock [place]. Neurocognition in patients with brain metastases treated with radiosurgery or radiosurgery plus whole-brain irradiation: a randomised controlled trial. Lancet Oncol. 2009 Nov;10(11):1037-44. doi: 10.1016/S1470-2045(09)70263-3. Epub 2009 Oct 2. PubMed PMID: 84166063.  4: Weyman Rodney, Estill Dooms, Coralee Pesa, Crocker IR, Lorie Phenix, Charlesetta Garibaldi,  Press RH, Tanya Nones, [place], Wait SD, Higinio Plan, Shu HG, Wayne [place]. Comparing Preoperative With Postoperative Stereotactic Radiosurgery for Resectable Brain Metastases: A Multi-institutional Analysis. Neurosurgery. 2015 Nov 2. [Epub ahead of print] PubMed PMID: 86773736.  This document serves as a record of services personally performed by Tyler Pita, MD and Freeman Caldron, PA-C. It was created on their behalf by Wilburn Mylar, a trained medical scribe. The creation of this record is based on the scribe's personal observations and the provider's statements to them. This document has been checked and approved by the attending provider.

## 2021-12-25 ENCOUNTER — Other Ambulatory Visit: Payer: Self-pay | Admitting: Neurosurgery

## 2021-12-25 NOTE — Progress Notes (Signed)
Called to speak with patient about transportation options, reached his voicemail. Left him a message asking for a call back.

## 2021-12-26 ENCOUNTER — Ambulatory Visit
Admission: RE | Admit: 2021-12-26 | Discharge: 2021-12-26 | Disposition: A | Discharge status: Home / Self Care | Payer: Medicare Other | Source: Ambulatory Visit | Attending: Radiation Oncology | Admitting: Radiation Oncology

## 2021-12-26 ENCOUNTER — Other Ambulatory Visit: Payer: Self-pay | Admitting: Neurosurgery

## 2021-12-26 DIAGNOSIS — I619 Nontraumatic intracerebral hemorrhage, unspecified: Secondary | ICD-10-CM | POA: Diagnosis not present

## 2021-12-26 DIAGNOSIS — C7931 Secondary malignant neoplasm of brain: Secondary | ICD-10-CM | POA: Diagnosis not present

## 2021-12-26 DIAGNOSIS — R220 Localized swelling, mass and lump, head: Secondary | ICD-10-CM | POA: Diagnosis not present

## 2021-12-26 DIAGNOSIS — G936 Cerebral edema: Secondary | ICD-10-CM | POA: Diagnosis not present

## 2021-12-26 MED ORDER — GADOBENATE DIMEGLUMINE 529 MG/ML IV SOLN
15.0000 mL | Freq: Once | INTRAVENOUS | Status: AC | PRN
  Administered 2021-12-26: 15 mL via INTRAVENOUS

## 2021-12-26 NOTE — Progress Notes (Signed)
Spoke with patient regarding transportation, he stated that he is ok right now. He has my number and extension if he does need transportation in the future for appointments.

## 2021-12-27 ENCOUNTER — Ambulatory Visit
Admission: RE | Admit: 2021-12-27 | Discharge: 2021-12-27 | Disposition: A | Discharge status: Home / Self Care | Payer: Medicare Other | Source: Ambulatory Visit | Attending: Radiation Oncology | Admitting: Radiation Oncology

## 2021-12-27 ENCOUNTER — Encounter: Payer: Self-pay | Admitting: Hematology and Oncology

## 2021-12-27 ENCOUNTER — Other Ambulatory Visit: Payer: Self-pay

## 2021-12-27 VITALS — BP 142/91 | HR 81 | Temp 98.4°F | Resp 18 | Ht 69.0 in | Wt 163.0 lb

## 2021-12-27 DIAGNOSIS — C778 Secondary and unspecified malignant neoplasm of lymph nodes of multiple regions: Secondary | ICD-10-CM

## 2021-12-27 DIAGNOSIS — C7931 Secondary malignant neoplasm of brain: Secondary | ICD-10-CM

## 2021-12-27 DIAGNOSIS — Z510 Encounter for antineoplastic radiation therapy: Secondary | ICD-10-CM | POA: Diagnosis not present

## 2021-12-27 DIAGNOSIS — C4360 Malignant melanoma of unspecified upper limb, including shoulder: Secondary | ICD-10-CM | POA: Diagnosis not present

## 2021-12-27 DIAGNOSIS — C4361 Malignant melanoma of right upper limb, including shoulder: Secondary | ICD-10-CM | POA: Insufficient documentation

## 2021-12-27 MED ORDER — SODIUM CHLORIDE 0.9% FLUSH
10.0000 mL | INTRAVENOUS | Status: DC | PRN
  Administered 2021-12-27: 10 mL via INTRAVENOUS

## 2021-12-28 NOTE — Progress Notes (Signed)
  Radiation Oncology         [phone] ________________________________  Name: Rene Lardy MRN: [id number]  Date: 12/27/2021  DOB: April 18, 1971  SIMULATION AND TREATMENT PLANNING NOTE    ICD-10-CM   1. 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 Malignant melanoma metastatic to brain [facility])  C79.31       DIAGNOSIS:  51 y.o. man with 4 brain metastases secondary to metastatic melanoma.  NARRATIVE:  The patient was brought to the CT Simulation planning suite.  Identity was confirmed.  All relevant records and images related to the planned course of therapy were reviewed.  The patient freely provided informed written consent to proceed with treatment after reviewing the details related to the planned course of therapy. The consent form was witnessed and verified by the simulation staff. Intravenous access was established for contrast administration. Then, the patient was set-up in a stable reproducible supine position for radiation therapy.  A relocatable thermoplastic stereotactic head frame was fabricated for precise immobilization.  CT images were obtained.  Surface markings were placed.  The CT images were loaded into the planning software and fused with the patient's targeting MRI scan.  Then the target and avoidance structures were contoured.  Treatment planning then occurred.  The radiation prescription was entered and confirmed.  I have requested 3D planning  I have requested a DVH of the following structures: Brain stem, brain, left eye, right eye, lenses, optic chiasm, target volumes, uninvolved brain, and normal tissue.    SPECIAL TREATMENT PROCEDURE:  The planned course of therapy using radiation constitutes a special treatment procedure. Special care is required in the management of this patient for the following reasons. This treatment constitutes a Special Treatment Procedure for  the following reason: High dose per fraction requiring special monitoring for increased toxicities of treatment including daily imaging.  The special nature of the planned course of radiotherapy will require increased physician supervision and oversight to ensure patient's safety with optimal treatment outcomes.  This requires extended time and effort.  PLAN:  The patient will receive 18 Gy in 1 fraction to the largest Left Temporal lesion with pre-op intent, and 20 Gy to each of the 3 other targets.  ________________________________  Artist Pais Kathrynn Running, M.D.

## 2021-12-30 ENCOUNTER — Encounter (HOSPITAL_COMMUNITY)
Admission: RE | Admit: 2021-12-30 | Discharge: 2021-12-30 | Disposition: A | Discharge status: Home / Self Care | Payer: Medicare Other | Source: Ambulatory Visit | Attending: Neurosurgery | Admitting: Neurosurgery

## 2021-12-30 ENCOUNTER — Other Ambulatory Visit: Payer: Self-pay

## 2021-12-30 ENCOUNTER — Encounter (HOSPITAL_COMMUNITY): Payer: Self-pay

## 2021-12-30 DIAGNOSIS — Z01812 Encounter for preprocedural laboratory examination: Secondary | ICD-10-CM | POA: Diagnosis not present

## 2022-01-01 DIAGNOSIS — C7931 Secondary malignant neoplasm of brain: Secondary | ICD-10-CM | POA: Diagnosis not present

## 2022-01-02 ENCOUNTER — Telehealth: Payer: Self-pay | Admitting: Oncology

## 2022-01-02 ENCOUNTER — Encounter: Payer: Self-pay | Admitting: Urology

## 2022-01-02 ENCOUNTER — Ambulatory Visit
Admission: RE | Admit: 2022-01-02 | Discharge: 2022-01-02 | Disposition: A | Discharge status: Home / Self Care | Payer: Medicare Other | Source: Ambulatory Visit | Attending: Radiation Oncology | Admitting: Radiation Oncology

## 2022-01-02 ENCOUNTER — Encounter: Payer: Self-pay | Admitting: Radiation Oncology

## 2022-01-02 ENCOUNTER — Other Ambulatory Visit: Payer: Self-pay

## 2022-01-02 DIAGNOSIS — C4360 Malignant melanoma of unspecified upper limb, including shoulder: Secondary | ICD-10-CM | POA: Diagnosis not present

## 2022-01-02 DIAGNOSIS — C7931 Secondary malignant neoplasm of brain: Secondary | ICD-10-CM | POA: Diagnosis not present

## 2022-01-02 DIAGNOSIS — Z510 Encounter for antineoplastic radiation therapy: Secondary | ICD-10-CM | POA: Diagnosis not present

## 2022-01-02 LAB — RAD ONC ARIA SESSION SUMMARY
Course Elapsed Days: 0
Plan Fractions Treated to Date: 1
Plan Prescribed Dose Per Fraction: 18 Gy
Plan Total Fractions Prescribed: 1
Plan Total Prescribed Dose: 18 Gy
Reference Point Dosage Given to Date: 18 Gy
Reference Point Session Dosage Given: 18 Gy
Session Number: 1

## 2022-01-02 NOTE — Progress Notes (Signed)
Patient over to nursing for 30 minute observation [place] [facility].  Denies headache, fatigue, visual changes, ringing in ears, and nausea.  Speech clear and no problems with gait ambulates out of clinic without difficulty. Vitals:  97.8-73-18-138/92 O2 sat 100%.

## 2022-01-02 NOTE — Progress Notes (Signed)
  Radiation Oncology         [phone] ________________________________  Stereotactic Treatment Procedure Note  Name: Xavier Jones MRN: [id number]  Date: 01/02/2022  DOB: 1970-12-14  SPECIAL TREATMENT PROCEDURE    ICD-10-CM   1. Malignant melanoma metastatic to brain [facility])  C79.31       3D TREATMENT PLANNING AND DOSIMETRY:  The patient's radiation plan was reviewed and approved by neurosurgery and radiation oncology prior to treatment.  It showed 3-dimensional radiation distributions overlaid onto the planning CT/MRI image set.  The [facility] for the target structures as well as the organs at risk were reviewed. The documentation of the 3D plan and dosimetry are filed in the radiation oncology EMR.  NARRATIVE:  Xavier Jones was brought to the TrueBeam stereotactic radiation treatment machine and placed supine on the CT couch. The head frame was applied, and the patient was set up for stereotactic radiosurgery.  Neurosurgery was present for the set-up and delivery  SIMULATION VERIFICATION:  In the couch zero-angle position, the patient underwent Exactrac imaging using the Brainlab system with orthogonal KV images.  These were carefully aligned and repeated to confirm treatment position for each of the isocenters.  The Exactrac snap film verification was repeated at each couch angle.  PROCEDURE: Xavier Jones received stereotactic radiosurgery to the following targets:   These targets were treated using single isocenter with 6 Rapid Arc VMAT Beams to a prescription dose of 18 Gy to targets 1 and 4 with 20 Gy to targets 2, 3 and 5.  ExacTrac registration was performed for each couch angle.  The 100% isodose line was prescribed.  6 MV X-rays were delivered in the flattening filter free beam mode.  STEREOTACTIC TREATMENT MANAGEMENT:  Following delivery, the patient was transported to nursing in stable condition and monitored for possible acute effects.  Vital signs were recorded .  The patient tolerated treatment without significant acute effects, and was discharged to home in stable condition.    PLAN: He'll undergo planned resection of the 3 cm Left Temporal met in the next 24 hours and then Follow-up in one month.  ________________________________  Sheral Apley. Tammi Klippel, M.D.

## 2022-01-02 NOTE — Telephone Encounter (Signed)
Patient has been scheduled. Aware of appt date and time   Scheduling Message Entered by Juanetta Beets on 12/27/2021 at  4:14 PM Priority: Routine <No visit type provided>  Department: [facility]  Provider:   Appointment Notes:  Please schedule pt to see Dr. Hinton Rao with labs 2-3 weeks.  Scheduling Notes:

## 2022-01-02 NOTE — Anesthesia Preprocedure Evaluation (Addendum)
Anesthesia Evaluation  Patient identified by MRN, date of birth, ID band Patient awake    Reviewed: Allergy & Precautions, NPO status , Patient's Chart, lab work & pertinent test results  Airway Mallampati: II  TM Distance: >3 FB Neck ROM: Full    Dental no notable dental hx. (+) Chipped,    Pulmonary neg pulmonary ROS, former smoker,    Pulmonary exam normal breath sounds clear to auscultation       Cardiovascular negative cardio ROS Normal cardiovascular exam Rhythm:Regular Rate:Normal     Neuro/Psych PSYCHIATRIC DISORDERS Depression negative neurological ROS     GI/Hepatic negative GI ROS, Neg liver ROS,   Endo/Other  negative endocrine ROS  Renal/GU negative Renal ROS     Musculoskeletal negative musculoskeletal ROS (+)   Abdominal   Peds  Hematology negative hematology ROS (+)   Anesthesia Other Findings   Reproductive/Obstetrics                          Anesthesia Physical Anesthesia Plan  ASA: 3  Anesthesia Plan: General   Post-op Pain Management: Ofirmev IV (intra-op)* and Minimal or no pain anticipated   Induction: Intravenous  PONV Risk Score and Plan: 2 and Ondansetron, Treatment may vary due to age or medical condition, Midazolam and Dexamethasone  Airway Management Planned: Oral ETT  Additional Equipment: Arterial line  Intra-op Plan:   Post-operative Plan: Extubation in OR  Informed Consent:   Plan Discussed with:   Anesthesia Plan Comments: (2 x PIV)        Anesthesia Quick Evaluation

## 2022-01-03 ENCOUNTER — Other Ambulatory Visit: Payer: Self-pay

## 2022-01-03 ENCOUNTER — Inpatient Hospital Stay (HOSPITAL_COMMUNITY)
Admission: RE | Admit: 2022-01-03 | Discharge: 2022-01-05 | DRG: 027 | Disposition: A | Discharge status: Home / Self Care | Payer: Medicare Other | Attending: Neurosurgery | Admitting: Neurosurgery

## 2022-01-03 ENCOUNTER — Inpatient Hospital Stay (HOSPITAL_COMMUNITY): Payer: Medicare Other | Admitting: Anesthesiology

## 2022-01-03 ENCOUNTER — Encounter (HOSPITAL_COMMUNITY): Payer: Self-pay | Admitting: Neurosurgery

## 2022-01-03 ENCOUNTER — Encounter (HOSPITAL_COMMUNITY)
Admission: RE | Disposition: A | Discharge status: Home / Self Care | Payer: Self-pay | Source: Home / Self Care | Attending: Neurosurgery

## 2022-01-03 DIAGNOSIS — Z85828 Personal history of other malignant neoplasm of skin: Secondary | ICD-10-CM | POA: Diagnosis not present

## 2022-01-03 DIAGNOSIS — Z888 Allergy status to other drugs, medicaments and biological substances status: Secondary | ICD-10-CM

## 2022-01-03 DIAGNOSIS — Z87891 Personal history of nicotine dependence: Secondary | ICD-10-CM

## 2022-01-03 DIAGNOSIS — D496 Neoplasm of unspecified behavior of brain: Principal | ICD-10-CM | POA: Diagnosis present

## 2022-01-03 DIAGNOSIS — F32A Depression, unspecified: Secondary | ICD-10-CM | POA: Diagnosis present

## 2022-01-03 DIAGNOSIS — Z981 Arthrodesis status: Secondary | ICD-10-CM

## 2022-01-03 DIAGNOSIS — G936 Cerebral edema: Secondary | ICD-10-CM | POA: Diagnosis not present

## 2022-01-03 DIAGNOSIS — C439 Malignant melanoma of skin, unspecified: Secondary | ICD-10-CM | POA: Diagnosis present

## 2022-01-03 DIAGNOSIS — C799 Secondary malignant neoplasm of unspecified site: Secondary | ICD-10-CM | POA: Diagnosis not present

## 2022-01-03 DIAGNOSIS — Z8042 Family history of malignant neoplasm of prostate: Secondary | ICD-10-CM

## 2022-01-03 DIAGNOSIS — Z85820 Personal history of malignant melanoma of skin: Secondary | ICD-10-CM | POA: Diagnosis not present

## 2022-01-03 DIAGNOSIS — Z803 Family history of malignant neoplasm of breast: Secondary | ICD-10-CM | POA: Diagnosis not present

## 2022-01-03 DIAGNOSIS — C7800 Secondary malignant neoplasm of unspecified lung: Secondary | ICD-10-CM

## 2022-01-03 DIAGNOSIS — Z7952 Long term (current) use of systemic steroids: Secondary | ICD-10-CM

## 2022-01-03 DIAGNOSIS — C7931 Secondary malignant neoplasm of brain: Secondary | ICD-10-CM | POA: Diagnosis present

## 2022-01-03 DIAGNOSIS — C719 Malignant neoplasm of brain, unspecified: Secondary | ICD-10-CM

## 2022-01-03 DIAGNOSIS — Z79899 Other long term (current) drug therapy: Secondary | ICD-10-CM

## 2022-01-03 DIAGNOSIS — Z882 Allergy status to sulfonamides status: Secondary | ICD-10-CM | POA: Diagnosis not present

## 2022-01-03 DIAGNOSIS — C787 Secondary malignant neoplasm of liver and intrahepatic bile duct: Secondary | ICD-10-CM

## 2022-01-03 HISTORY — PX: APPLICATION OF CRANIAL NAVIGATION: SHX6578

## 2022-01-03 HISTORY — PX: CRANIOTOMY: SHX93

## 2022-01-03 LAB — ABO/RH: ABO/RH(D): O POS

## 2022-01-03 LAB — PREPARE RBC (CROSSMATCH)

## 2022-01-03 SURGERY — CRANIOTOMY TUMOR EXCISION
Anesthesia: General | Site: Head

## 2022-01-03 MED ORDER — LEVETIRACETAM IN NACL 500 MG/100ML IV SOLN: 500.0000 mg | Freq: Two times a day (BID) | INTRAVENOUS | Status: DC

## 2022-01-03 MED ORDER — ROCURONIUM BROMIDE 10 MG/ML (PF) SYRINGE
PREFILLED_SYRINGE | INTRAVENOUS | Status: AC
  Filled 2022-01-03: qty 10

## 2022-01-03 MED ORDER — DEXAMETHASONE SODIUM PHOSPHATE 10 MG/ML IJ SOLN
INTRAMUSCULAR | Status: AC
  Filled 2022-01-03: qty 1

## 2022-01-03 MED ORDER — LEVETIRACETAM IN NACL 500 MG/100ML IV SOLN
500.0000 mg | Freq: Two times a day (BID) | INTRAVENOUS | Status: DC
  Administered 2022-01-03: 500 mg via INTRAVENOUS
  Filled 2022-01-03: qty 100

## 2022-01-03 MED ORDER — BUPIVACAINE HCL (PF) 0.5 % IJ SOLN
INTRAMUSCULAR | Status: AC
  Filled 2022-01-03: qty 30

## 2022-01-03 MED ORDER — SODIUM CHLORIDE 0.9 % IR SOLN
Status: DC | PRN
  Administered 2022-01-03: 1000 mL

## 2022-01-03 MED ORDER — CHLORHEXIDINE GLUCONATE CLOTH 2 % EX PADS
6.0000 | MEDICATED_PAD | Freq: Once | CUTANEOUS | Status: DC
  Administered 2022-01-03: 6 via TOPICAL

## 2022-01-03 MED ORDER — HYDROCODONE-ACETAMINOPHEN 5-325 MG PO TABS
1.0000 | ORAL_TABLET | ORAL | Status: DC | PRN
  Administered 2022-01-03 – 2022-01-04 (×5): 1 via ORAL
  Filled 2022-01-03 (×5): qty 1

## 2022-01-03 MED ORDER — SODIUM CHLORIDE 0.9 % IV SOLN: INTRAVENOUS | Status: DC

## 2022-01-03 MED ORDER — PROPOFOL 10 MG/ML IV BOLUS
INTRAVENOUS | Status: AC
  Filled 2022-01-03: qty 20

## 2022-01-03 MED ORDER — SODIUM CHLORIDE 0.9 % IV SOLN: INTRAVENOUS | Status: DC | PRN

## 2022-01-03 MED ORDER — DEXAMETHASONE SODIUM PHOSPHATE 10 MG/ML IJ SOLN
INTRAMUSCULAR | Status: DC | PRN
  Administered 2022-01-03: 10 mg via INTRAVENOUS

## 2022-01-03 MED ORDER — LIDOCAINE 2% (20 MG/ML) 5 ML SYRINGE
INTRAMUSCULAR | Status: AC
  Filled 2022-01-03: qty 5

## 2022-01-03 MED ORDER — THROMBIN 5000 UNITS EX SOLR
CUTANEOUS | Status: AC
  Filled 2022-01-03: qty 5000

## 2022-01-03 MED ORDER — ONDANSETRON HCL 4 MG/2ML IJ SOLN: 4.0000 mg | INTRAMUSCULAR | Status: DC | PRN

## 2022-01-03 MED ORDER — FENTANYL CITRATE (PF) 250 MCG/5ML IJ SOLN
INTRAMUSCULAR | Status: AC
  Filled 2022-01-03: qty 5

## 2022-01-03 MED ORDER — ESMOLOL HCL 100 MG/10ML IV SOLN
INTRAVENOUS | Status: AC
  Filled 2022-01-03: qty 10

## 2022-01-03 MED ORDER — THROMBIN 20000 UNITS EX SOLR
CUTANEOUS | Status: AC
  Filled 2022-01-03: qty 20000

## 2022-01-03 MED ORDER — PROMETHAZINE HCL 25 MG PO TABS: 12.5000 mg | ORAL_TABLET | ORAL | Status: DC | PRN

## 2022-01-03 MED ORDER — BISACODYL 10 MG RE SUPP: 10.0000 mg | Freq: Every day | RECTAL | Status: DC | PRN

## 2022-01-03 MED ORDER — THROMBIN 5000 UNITS EX SOLR
OROMUCOSAL | Status: DC | PRN
  Administered 2022-01-03: 5 mL via TOPICAL

## 2022-01-03 MED ORDER — CEFAZOLIN SODIUM-DEXTROSE 1-4 GM/50ML-% IV SOLN
1.0000 g | Freq: Three times a day (TID) | INTRAVENOUS | Status: AC
  Administered 2022-01-03 (×2): 1 g via INTRAVENOUS
  Filled 2022-01-03 (×2): qty 50

## 2022-01-03 MED ORDER — CEFAZOLIN SODIUM-DEXTROSE 2-4 GM/100ML-% IV SOLN
2.0000 g | INTRAVENOUS | Status: AC
  Administered 2022-01-03: 2 mg via INTRAVENOUS

## 2022-01-03 MED ORDER — CHLORHEXIDINE GLUCONATE 0.12 % MT SOLN
OROMUCOSAL | Status: AC
  Administered 2022-01-03: 15 mL
  Filled 2022-01-03: qty 15

## 2022-01-03 MED ORDER — ROCURONIUM BROMIDE 10 MG/ML (PF) SYRINGE
PREFILLED_SYRINGE | INTRAVENOUS | Status: DC | PRN
  Administered 2022-01-03: 30 mg via INTRAVENOUS
  Administered 2022-01-03: 70 mg via INTRAVENOUS
  Administered 2022-01-03: 30 mg via INTRAVENOUS
  Administered 2022-01-03: 20 mg via INTRAVENOUS

## 2022-01-03 MED ORDER — PROMETHAZINE HCL 25 MG/ML IJ SOLN: 6.2500 mg | INTRAMUSCULAR | Status: DC | PRN

## 2022-01-03 MED ORDER — FENTANYL CITRATE (PF) 250 MCG/5ML IJ SOLN
INTRAMUSCULAR | Status: DC | PRN
  Administered 2022-01-03 (×2): 100 ug via INTRAVENOUS
  Administered 2022-01-03: 50 ug via INTRAVENOUS

## 2022-01-03 MED ORDER — ONDANSETRON HCL 4 MG/2ML IJ SOLN
INTRAMUSCULAR | Status: DC | PRN
  Administered 2022-01-03: 4 mg via INTRAVENOUS

## 2022-01-03 MED ORDER — BACITRACIN ZINC 500 UNIT/GM EX OINT
TOPICAL_OINTMENT | CUTANEOUS | Status: AC
  Filled 2022-01-03: qty 28.35

## 2022-01-03 MED ORDER — MIDAZOLAM HCL 2 MG/2ML IJ SOLN
INTRAMUSCULAR | Status: AC
  Filled 2022-01-03: qty 2

## 2022-01-03 MED ORDER — PANTOPRAZOLE SODIUM 40 MG IV SOLR
40.0000 mg | Freq: Every day | INTRAVENOUS | Status: DC
Start: 2022-01-03 — End: 2022-01-04
  Administered 2022-01-03: 40 mg via INTRAVENOUS
  Filled 2022-01-03: qty 10

## 2022-01-03 MED ORDER — SUCCINYLCHOLINE CHLORIDE 200 MG/10ML IV SOSY
PREFILLED_SYRINGE | INTRAVENOUS | Status: AC
  Filled 2022-01-03: qty 10

## 2022-01-03 MED ORDER — MEPERIDINE HCL 25 MG/ML IJ SOLN: 6.2500 mg | INTRAMUSCULAR | Status: DC | PRN

## 2022-01-03 MED ORDER — AMITRIPTYLINE HCL 25 MG PO TABS
50.0000 mg | ORAL_TABLET | Freq: Every day | ORAL | Status: DC
Start: 2022-01-03 — End: 2022-01-05
  Administered 2022-01-03 – 2022-01-04 (×2): 50 mg via ORAL
  Filled 2022-01-03 (×2): qty 2

## 2022-01-03 MED ORDER — ONDANSETRON HCL 4 MG/2ML IJ SOLN
INTRAMUSCULAR | Status: AC
  Filled 2022-01-03: qty 2

## 2022-01-03 MED ORDER — LIDOCAINE-EPINEPHRINE 1 %-1:100000 IJ SOLN
INTRAMUSCULAR | Status: AC
  Filled 2022-01-03: qty 1

## 2022-01-03 MED ORDER — DEXAMETHASONE SODIUM PHOSPHATE 4 MG/ML IJ SOLN: 4.0000 mg | Freq: Three times a day (TID) | INTRAMUSCULAR | Status: DC

## 2022-01-03 MED ORDER — PROPOFOL 10 MG/ML IV BOLUS
INTRAVENOUS | Status: DC | PRN
  Administered 2022-01-03: 40 mg via INTRAVENOUS
  Administered 2022-01-03: 30 mg via INTRAVENOUS
  Administered 2022-01-03: 130 mg via INTRAVENOUS
  Administered 2022-01-03: 50 mg via INTRAVENOUS

## 2022-01-03 MED ORDER — 0.9 % SODIUM CHLORIDE (POUR BTL) OPTIME
TOPICAL | Status: DC | PRN
  Administered 2022-01-03: 3000 mL

## 2022-01-03 MED ORDER — FENTANYL CITRATE (PF) 100 MCG/2ML IJ SOLN: 25.0000 ug | INTRAMUSCULAR | Status: DC | PRN

## 2022-01-03 MED ORDER — ACETAMINOPHEN 10 MG/ML IV SOLN
INTRAVENOUS | Status: DC | PRN
  Administered 2022-01-03: 1000 mg via INTRAVENOUS

## 2022-01-03 MED ORDER — SODIUM CHLORIDE 0.9 % IV SOLN
0.1500 ug/kg/min | INTRAVENOUS | Status: AC
  Administered 2022-01-03: .05 ug/kg/min via INTRAVENOUS
  Filled 2022-01-03: qty 2000

## 2022-01-03 MED ORDER — MIDAZOLAM HCL 5 MG/5ML IJ SOLN
INTRAMUSCULAR | Status: DC | PRN
  Administered 2022-01-03: 1 mg via INTRAVENOUS

## 2022-01-03 MED ORDER — THROMBIN 20000 UNITS EX SOLR
CUTANEOUS | Status: DC | PRN
  Administered 2022-01-03: 20 mL via TOPICAL

## 2022-01-03 MED ORDER — ACETAMINOPHEN 325 MG PO TABS
650.0000 mg | ORAL_TABLET | ORAL | Status: DC | PRN
  Administered 2022-01-03 – 2022-01-05 (×4): 650 mg via ORAL
  Filled 2022-01-03 (×4): qty 2

## 2022-01-03 MED ORDER — DEXAMETHASONE SODIUM PHOSPHATE 10 MG/ML IJ SOLN
6.0000 mg | Freq: Four times a day (QID) | INTRAMUSCULAR | Status: AC
  Administered 2022-01-03 – 2022-01-04 (×4): 6 mg via INTRAVENOUS
  Filled 2022-01-03 (×4): qty 1

## 2022-01-03 MED ORDER — ACETAMINOPHEN 650 MG RE SUPP: 650.0000 mg | RECTAL | Status: DC | PRN

## 2022-01-03 MED ORDER — MORPHINE SULFATE (PF) 2 MG/ML IV SOLN: 1.0000 mg | INTRAVENOUS | Status: DC | PRN

## 2022-01-03 MED ORDER — LIDOCAINE 2% (20 MG/ML) 5 ML SYRINGE
INTRAMUSCULAR | Status: DC | PRN
  Administered 2022-01-03: 70 mg via INTRAVENOUS

## 2022-01-03 MED ORDER — ACYCLOVIR 400 MG PO TABS
400.0000 mg | ORAL_TABLET | Freq: Every day | ORAL | Status: DC
  Administered 2022-01-04 – 2022-01-05 (×2): 400 mg via ORAL
  Filled 2022-01-03 (×2): qty 1

## 2022-01-03 MED ORDER — CHLORHEXIDINE GLUCONATE CLOTH 2 % EX PADS: 6.0000 | MEDICATED_PAD | Freq: Once | CUTANEOUS | Status: DC

## 2022-01-03 MED ORDER — SENNOSIDES-DOCUSATE SODIUM 8.6-50 MG PO TABS
1.0000 | ORAL_TABLET | Freq: Every evening | ORAL | Status: DC | PRN
Start: 2022-01-03 — End: 2022-01-05

## 2022-01-03 MED ORDER — ONDANSETRON HCL 4 MG PO TABS: 4.0000 mg | ORAL_TABLET | ORAL | Status: DC | PRN

## 2022-01-03 MED ORDER — SUGAMMADEX SODIUM 200 MG/2ML IV SOLN
INTRAVENOUS | Status: DC | PRN
  Administered 2022-01-03: 200 mg via INTRAVENOUS

## 2022-01-03 MED ORDER — HEMOSTATIC AGENTS (NO CHARGE) OPTIME
TOPICAL | Status: DC | PRN
  Administered 2022-01-03 (×2): 1 via TOPICAL

## 2022-01-03 MED ORDER — SODIUM CHLORIDE 0.9 % IV SOLN
INTRAVENOUS | Status: DC | PRN
  Administered 2022-01-03: 1000 mg via INTRAVENOUS

## 2022-01-03 MED ORDER — PHENYLEPHRINE HCL-NACL 20-0.9 MG/250ML-% IV SOLN
INTRAVENOUS | Status: DC | PRN
  Administered 2022-01-03: 15 ug/min via INTRAVENOUS

## 2022-01-03 MED ORDER — BUPIVACAINE HCL (PF) 0.5 % IJ SOLN
INTRAMUSCULAR | Status: DC | PRN
  Administered 2022-01-03: 3.5 mL

## 2022-01-03 MED ORDER — BACITRACIN ZINC 500 UNIT/GM EX OINT
TOPICAL_OINTMENT | CUTANEOUS | Status: DC | PRN
  Administered 2022-01-03: 1 via TOPICAL

## 2022-01-03 MED ORDER — DEXAMETHASONE SODIUM PHOSPHATE 4 MG/ML IJ SOLN
4.0000 mg | Freq: Four times a day (QID) | INTRAMUSCULAR | Status: AC
  Administered 2022-01-04 – 2022-01-05 (×4): 4 mg via INTRAVENOUS
  Filled 2022-01-03 (×4): qty 1

## 2022-01-03 MED ORDER — LABETALOL HCL 5 MG/ML IV SOLN: 10.0000 mg | INTRAVENOUS | Status: DC | PRN

## 2022-01-03 MED ORDER — CEFAZOLIN SODIUM-DEXTROSE 2-4 GM/100ML-% IV SOLN
INTRAVENOUS | Status: AC
  Filled 2022-01-03: qty 100

## 2022-01-03 MED ORDER — LIDOCAINE-EPINEPHRINE 1 %-1:100000 IJ SOLN
INTRAMUSCULAR | Status: DC | PRN
  Administered 2022-01-03: 3.5 mL

## 2022-01-03 SURGICAL SUPPLY — 100 items
BAG COUNTER SPONGE SURGICOUNT (BAG) ×3 IMPLANT
BAND RUBBER #18 3X1/16 STRL (MISCELLANEOUS) ×2 IMPLANT
BENZOIN TINCTURE PRP APPL 2/3 (GAUZE/BANDAGES/DRESSINGS) IMPLANT
BLADE CLIPPER SURG (BLADE) ×3 IMPLANT
BLADE SAW GIGLI 16 STRL (MISCELLANEOUS) IMPLANT
BLADE SURG 15 STRL LF DISP TIS (BLADE) IMPLANT
BLADE SURG 15 STRL SS (BLADE)
BLADE ULTRA TIP 2M (BLADE) ×3 IMPLANT
BNDG GAUZE ELAST 4 BULKY (GAUZE/BANDAGES/DRESSINGS) IMPLANT
BNDG STRETCH 4X75 STRL LF (GAUZE/BANDAGES/DRESSINGS) IMPLANT
BUR ACORN 6.0 PRECISION (BURR) ×3 IMPLANT
BUR ROUND FLUTED 4 SOFT TCH (BURR) IMPLANT
BUR SPIRAL ROUTER 2.3 (BUR) ×3 IMPLANT
CANISTER SUCT 3000ML PPV (MISCELLANEOUS) ×6 IMPLANT
CARTRIDGE OIL MAESTRO DRILL (MISCELLANEOUS) ×2 IMPLANT
CASSETTE SUCT IRRIG SONOPET IQ (MISCELLANEOUS) ×1 IMPLANT
CATH VENTRIC 35X38 W/TROCAR LG (CATHETERS) IMPLANT
CLIP VESOCCLUDE MED 6/CT (CLIP) IMPLANT
CNTNR URN SCR LID CUP LEK RST (MISCELLANEOUS) ×2 IMPLANT
CONT SPEC 4OZ STRL OR WHT (MISCELLANEOUS) ×1
COVER BURR HOLE 7 (Orthopedic Implant) ×2 IMPLANT
COVER MAYO STAND STRL (DRAPES) IMPLANT
DIFFUSER DRILL AIR PNEUMATIC (MISCELLANEOUS) ×3 IMPLANT
DRAIN SUBARACHNOID (WOUND CARE) IMPLANT
DRAPE HALF SHEET 40X57 (DRAPES) ×3 IMPLANT
DRAPE MICROSCOPE LEICA (MISCELLANEOUS) ×1 IMPLANT
DRAPE NEUROLOGICAL W/INCISE (DRAPES) ×3 IMPLANT
DRAPE STERI IOBAN 125X83 (DRAPES) IMPLANT
DRAPE SURG 17X23 STRL (DRAPES) IMPLANT
DRAPE WARM FLUID 44X44 (DRAPES) ×3 IMPLANT
DRSG ADAPTIC 3X8 NADH LF (GAUZE/BANDAGES/DRESSINGS) IMPLANT
DRSG TELFA 3X8 NADH (GAUZE/BANDAGES/DRESSINGS) ×3 IMPLANT
DURAPREP 6ML APPLICATOR 50/CS (WOUND CARE) ×3 IMPLANT
ELECT REM PT RETURN 9FT ADLT (ELECTROSURGICAL) ×3
ELECTRODE REM PT RTRN 9FT ADLT (ELECTROSURGICAL) ×2 IMPLANT
EVACUATOR 1/8 PVC DRAIN (DRAIN) IMPLANT
EVACUATOR SILICONE 100CC (DRAIN) IMPLANT
FORCEPS BIPOLAR SPETZLER 8 1.0 (NEUROSURGERY SUPPLIES) ×3 IMPLANT
GAUZE 4X4 16PLY ~~LOC~~+RFID DBL (SPONGE) IMPLANT
GAUZE SPONGE 4X4 12PLY STRL (GAUZE/BANDAGES/DRESSINGS) ×2 IMPLANT
GLOVE BIO SURGEON STRL SZ7.5 (GLOVE) ×3 IMPLANT
GLOVE BIOGEL PI IND STRL 7.0 (GLOVE) IMPLANT
GLOVE BIOGEL PI IND STRL 7.5 (GLOVE) ×4 IMPLANT
GLOVE BIOGEL PI INDICATOR 7.0 (GLOVE) ×4
GLOVE BIOGEL PI INDICATOR 7.5 (GLOVE) ×3
GLOVE ECLIPSE 7.0 STRL STRAW (GLOVE) ×6 IMPLANT
GLOVE EXAM NITRILE XL STR (GLOVE) IMPLANT
GOWN STRL REUS W/ TWL LRG LVL3 (GOWN DISPOSABLE) ×4 IMPLANT
GOWN STRL REUS W/ TWL XL LVL3 (GOWN DISPOSABLE) IMPLANT
GOWN STRL REUS W/TWL 2XL LVL3 (GOWN DISPOSABLE) IMPLANT
GOWN STRL REUS W/TWL LRG LVL3 (GOWN DISPOSABLE) ×5
GOWN STRL REUS W/TWL XL LVL3 (GOWN DISPOSABLE) ×2
HEMOSTAT POWDER KIT SURGIFOAM (HEMOSTASIS) ×3 IMPLANT
HEMOSTAT SURGICEL 2X14 (HEMOSTASIS) ×3 IMPLANT
HOOK DURA 1/2IN (MISCELLANEOUS) ×3 IMPLANT
IV NS 1000ML (IV SOLUTION) ×2
IV NS 1000ML BAXH (IV SOLUTION) ×2 IMPLANT
KIT BASIN OR (CUSTOM PROCEDURE TRAY) ×3 IMPLANT
KIT DRAIN CSF ACCUDRAIN (MISCELLANEOUS) IMPLANT
KIT TURNOVER KIT B (KITS) ×3 IMPLANT
KNIFE ARACHNOID DISP AM-24-S (MISCELLANEOUS) ×4 IMPLANT
MARKER SPHERE PSV REFLC 13MM (MARKER) ×6 IMPLANT
NDL SPNL 18GX3.5 QUINCKE PK (NEEDLE) IMPLANT
NEEDLE HYPO 22GX1.5 SAFETY (NEEDLE) ×3 IMPLANT
NEEDLE SPNL 18GX3.5 QUINCKE PK (NEEDLE) IMPLANT
NS IRRIG 1000ML POUR BTL (IV SOLUTION) ×9 IMPLANT
OIL CARTRIDGE MAESTRO DRILL (MISCELLANEOUS) ×3
PACK BATTERY CMF DISP FOR DVR (ORTHOPEDIC DISPOSABLE SUPPLIES) ×1 IMPLANT
PACK CRANIOTOMY CUSTOM (CUSTOM PROCEDURE TRAY) ×3 IMPLANT
PAD DRESSING TELFA 3X8 NADH (GAUZE/BANDAGES/DRESSINGS) IMPLANT
PATTIES SURGICAL .25X.25 (GAUZE/BANDAGES/DRESSINGS) IMPLANT
PATTIES SURGICAL .5 X.5 (GAUZE/BANDAGES/DRESSINGS) IMPLANT
PATTIES SURGICAL .5 X3 (DISPOSABLE) IMPLANT
PATTIES SURGICAL 1/4 X 3 (GAUZE/BANDAGES/DRESSINGS) IMPLANT
PATTIES SURGICAL 1X1 (DISPOSABLE) IMPLANT
PIN MAYFIELD SKULL DISP (PIN) ×3 IMPLANT
PLATE UNIV CMF 16 2H (Plate) ×2 IMPLANT
SCREW UNIII AXS SD 1.5X4 (Screw) ×13 IMPLANT
SPECIMEN JAR SMALL (MISCELLANEOUS) ×1 IMPLANT
SPIKE FLUID TRANSFER (MISCELLANEOUS) ×3 IMPLANT
SPONGE NEURO XRAY DETECT 1X3 (DISPOSABLE) IMPLANT
SPONGE SURGIFOAM ABS GEL 100 (HEMOSTASIS) ×4 IMPLANT
STAPLER VISISTAT 35W (STAPLE) ×3 IMPLANT
STOCKINETTE 6  STRL (DRAPES)
STOCKINETTE 6 STRL (DRAPES) IMPLANT
SUT ETHILON 3 0 FSL (SUTURE) IMPLANT
SUT ETHILON 3 0 PS 1 (SUTURE) IMPLANT
SUT NURALON 4 0 TR CR/8 (SUTURE) ×7 IMPLANT
SUT SILK 0 TIES 10X30 (SUTURE) IMPLANT
SUT VIC AB 0 CT1 18XCR BRD8 (SUTURE) ×4 IMPLANT
SUT VIC AB 0 CT1 8-18 (SUTURE) ×6
SUT VIC AB 3-0 SH 8-18 (SUTURE) ×6 IMPLANT
TAPE CLOTH 1X10 TAN NS (GAUZE/BANDAGES/DRESSINGS) ×3 IMPLANT
TIP TISSUE SONOPET IQ STD 12 (TIP) ×1 IMPLANT
TOWEL GREEN STERILE (TOWEL DISPOSABLE) ×3 IMPLANT
TOWEL GREEN STERILE FF (TOWEL DISPOSABLE) ×3 IMPLANT
TRAY FOLEY MTR SLVR 16FR STAT (SET/KITS/TRAYS/PACK) ×3 IMPLANT
TUBE CONNECTING 12X1/4 (SUCTIONS) ×3 IMPLANT
UNDERPAD 30X36 HEAVY ABSORB (UNDERPADS AND DIAPERS) ×3 IMPLANT
WATER STERILE IRR 1000ML POUR (IV SOLUTION) ×3 IMPLANT

## 2022-01-03 NOTE — H&P (Signed)
Chief Complaint  Metastatic Melanoma  History of Present Illness  Xavier Jones is a 51 y.o. male with a history of melanoma diagnosed a few years ago with a small skin lesion.  Work-up at that time did not reveal any metastatic disease.  Patient has done well until recently he noticed another skin lesion.  Further work-up included both CT and PET scans as well as MRI of the brain.  He was noted to have multiple small pulmonary and hepatic nodules as well as large enhancing lesions within the brain.  In addition, upon questioning the patient does report mild to moderate morning headaches for several months, as well as some subjective speech difficulty including word finding difficulty and feeling of being "tongue-tied."  His case has been discussed at the multidisciplinary neuro-oncology conference.  He has undergone stereotactic radiosurgery as a primary treatment to the 4 small lesions, as well as preoperative radiosurgery to the large dominant left temporal lesion.  He presents today for surgical resection.  Past Medical History   Past Medical History:  Diagnosis Date   Depression    mild   Family history of breast cancer 01/22/2021   Family history of prostate cancer 01/22/2021   Genital herpes    History of back injury 2003   Skin cancer    melanoma    Past Surgical History   Past Surgical History:  Procedure Laterality Date   MELANOMA EXCISION WITH SENTINEL LYMPH NODE BIOPSY Right 2021   SPINAL FUSION W/ LUQUE UNIT ROD  2003    Social History   Social History   Tobacco Use   Smoking status: Former    Types: Cigarettes    Quit date: 1998    Years since quitting: 25.5   Smokeless tobacco: Never  Vaping Use   Vaping Use: Never used  Substance Use Topics   Alcohol use: Yes    Alcohol/week: 14.0 standard drinks of alcohol    Types: 14 Cans of beer per week    Comment: 2-3 beers per evening   Drug use: Not Currently    Medications   Prior to Admission  medications   Medication Sig Start Date End Date Taking? Authorizing Provider  acyclovir (ZOVIRAX) 400 MG tablet Take 400 mg by mouth daily. 10/07/21  Yes [provider]  amitriptyline (ELAVIL) 25 MG tablet Take 50 mg by mouth at bedtime.   Yes [provider]  dexamethasone (DECADRON) 4 MG tablet Take 1 tablet (4 mg total) by mouth 3 (three) times daily. 12/19/21  Yes Dayton Scrape A, NP  ergocalciferol (VITAMIN D2) 1.25 MG (50000 UT) capsule Take 50,000 Units by mouth every Wednesday.   Yes [provider]    Allergies   Allergies  Allergen Reactions   Testosterone Itching    Gel    Sulfa Antibiotics Rash    Review of Systems  ROS  Neurologic Exam  Awake, alert, oriented Memory and concentration grossly intact Speech fluent, appropriate CN grossly intact Motor exam: Upper Extremities Deltoid Bicep Tricep Grip  Right 5/5 5/5 5/5 5/5  Left 5/5 5/5 5/5 5/5   Lower Extremities IP Quad PF DF EHL  Right 5/5 5/5 5/5 5/5 5/5  Left 5/5 5/5 5/5 5/5 5/5   Sensation grossly intact to LT  Imaging  MRI of the brain dated 12/27/2021 was personally reviewed.  This demonstrates the primary enhancing lesion in the superficial left temporal region measuring nearly 3 cm.  There is significant surrounding edema.  There are also  multiple smaller lesions including small left frontal lesion above the orbit, paramedian orbitofrontal region on the right side, lesion in the left brachium pontis, as well as a small lesion within the medial right occipital region.  Impression  - 51 y.o. male with metastatic melanoma including a dominant nearly 3 cm superficial left temporal metastasis.  Patient's case has been discussed at the multidisciplinary neuro-oncology conference where opinion was to proceed with preoperative radiosurgery to the large temporal lesion followed by surgical resection.  Patient has undergone preoperative radiosurgery to 46 Gray to this lesion.  He has  received 20 Gray to the other smaller lesions as a primary treatment.  Plan  -We will plan on proceeding with stereotactic left temporal craniotomy for resection of tumor  I have reviewed the treatment options at length with the patient in the office.  We have discussed the details of the operation as well as the expected postoperative course and recovery.  We have reviewed the risks of the craniotomy including the potential for worsening speech deficit postoperatively, weakness, bleeding, infection, hydrocephalus, and seizures.  All his questions today were answered.  He provided informed consent to proceed.   Consuella Lose, MD [facility]

## 2022-01-03 NOTE — Anesthesia Postprocedure Evaluation (Signed)
Anesthesia Post Note  Patient: Xavier Jones  Procedure(s) Performed: Stereotactic Left temporal craniotomy for resection of tumor (Left: Head) APPLICATION OF CRANIAL NAVIGATION (Head)     Patient location during evaluation: PACU Anesthesia Type: General Level of consciousness: sedated and patient cooperative Pain management: pain level controlled Vital Signs Assessment: post-procedure vital signs reviewed and stable Respiratory status: spontaneous breathing Cardiovascular status: stable Anesthetic complications: no   No notable events documented.  Last Vitals:  Vitals:   01/03/22 1040 01/03/22 1045  BP: (!) 137/91 (!) 141/91  Pulse: 72 69  Resp: 12 (!) 9  Temp: (!) 36.1 C   SpO2: 97% 97%    Last Pain:  Vitals:   01/03/22 1100  TempSrc:   PainSc: 0-No pain                 Nolon Nations

## 2022-01-03 NOTE — Op Note (Signed)
NEUROSURGERY OPERATIVE NOTE   PREOP DIAGNOSIS:  Metastatic brain tumor   POSTOP DIAGNOSIS: Same  PROCEDURE: Stereotactic left temporal craniotomy for resection of tumor Use of intraoperative microscope for microdissection  SURGEON: Dr. Consuella Lose, MD  ASSISTANT: Dr. Duffy Rhody, MD  ANESTHESIA: General Endotracheal  EBL: 50cc  SPECIMENS: Left temporal tumor for permanent pathology  DRAINS: None  COMPLICATIONS: None immediate  CONDITION: Hemodynamically stable to postanesthesia care unit  HISTORY: Xavier Jones is a 51 y.o. male initially presenting to the outpatient clinic after recent staging MRI scan demonstrating multiple intracranial metastases from a known diagnosis of melanoma.  Patient's case was discussed at the multidisciplinary neuro-oncology conference.  Consensus opinion was for primary stereotactic radiosurgery to the smaller metastases with preoperative stereotactic radiosurgery followed by surgical resection for the 3 cm superficial left temporal lesion.  The risks, benefits, and alternatives to surgery were all reviewed in detail with the patient.  After all his questions were answered informed consent was obtained and witnessed.  PROCEDURE IN DETAIL: The patient was brought to the operating room. After induction of general anesthesia, the patient was positioned on the operative table in the [place] head holder in the supine position. All pressure points were meticulously padded.  Preoperative stereotactic MRI scan was then coregistered with surface markers until a satisfactory accuracy was achieved.  Stereotactic system was then used to mark out a lazy S type incision centered around the tumor just above the ear.  Skin incision was then marked out and prepped and draped in the usual sterile fashion.  After timeout was conducted, the incision was infiltrated with local anesthetic with epinephrine.  Incision was then made sharply and carried down  through the galea.  Raney clips were applied.  Bovie electrocautery was used to dissect and incised the temporalis muscle and fascia.  Subperiosteal dissection was then carried out and self-retaining retractors were placed.  Stereotactic system was then used to confirm we had access to the underlying tumor.  2 bur holes were then created and connected with the craniotome.  A single piece temporal flap was then elevated.  Hemostasis was secured on the epidural plane with bipolar electrocautery and morselized Gelfoam with thrombin.  The dura was then incised in cruciate fashion.  A large temporal draining vein was identified just anterior to the purple tumor.  Arachnoid was carefully cut just posterior to the large temporal draining vein in order to preserve this vessel.  The pia overlying the tumor was then coagulated with the bipolar and cut with scissors.  Utilizing a combination of ultrasonic aspirator on very low settings and the bipolar, the tumor was dissected away from the surrounding white matter.  This was initially completed in the anterior, superior, and inferior margins.  This then allowed dissection of the deep margin, leaving the posterior aspect of the temporal region largely unmolested.  Tumor was then removed en bloc and sent for permanent pathology.  The resection cavity was then inspected and no obvious tumor was identified.  Hemostasis was secured using a combination of bipolar electrocautery and morselized Gelfoam with thrombin.  The wound was then irrigated with a copious amount of normal saline irrigation.  Dura was then replaced, and a Gelfoam onlay graft was placed.  The bone flap was then replaced and plated with standard titanium plates and screws.  The wound was again irrigated.  Temporalis muscle and fascia was reapproximated with interrupted 0 Vicryl stitches and the galea was reapproximated with interrupted 3-0 Vicryl stitches.  Skin was closed with staples.  Bacitracin ointment and  sterile dressing was applied.  At the end of the case all sponge, needle, instrument, and cottonoid counts were correct.  Patient was then extubated and taken to the postanesthesia care unit in stable hemodynamic condition.   Consuella Lose, MD [facility]

## 2022-01-03 NOTE — Op Note (Signed)
Name: Xavier Jones    MRN: [id number]   Date: 01/02/2022    DOB: Dec 26, 1970   STEREOTACTIC RADIOSURGERY OPERATIVE NOTE  PRE-OPERATIVE DIAGNOSIS:  Metastatic Melanoma to Brain  POST-OPERATIVE DIAGNOSIS:  Same  PROCEDURE:  Stereotactic Radiosurgery  SURGEON:  Consuella Lose, MD  RADIATION ONCOLOGIST: Dr. Tyler Pita, [facility]  TECHNIQUE:  The patient underwent a radiation treatment planning session in the radiation oncology simulation suite under the care of the radiation oncology physician and physicist.  I participated closely in the radiation treatment planning afterwards. The patient underwent planning CT which was fused to 3T high resolution MRI with 1 mm axial slices.  These images were fused on the planning system.  Radiation oncology contoured the gross target volume and subsequently expanded this to yield the Planning Target Volume. I actively participated in the planning process.  I helped to define and review the target contours and also the contours of the optic pathway, eyes, brainstem and selected nearby organs at risk.  All the dose constraints for critical structures were reviewed and compared to AAPM Task Group 101.  The prescription dose conformity was reviewed.  I approved the plan electronically.    Accordingly, [name] was brought to the TrueBeam stereotactic radiation treatment linac and placed in the custom immobilization mask.  The patient was aligned according to the IR fiducial markers with BrainLab Exactrac, then orthogonal x-rays were used in ExacTrac with the 6DOF robotic table and the shifts were made to align the patient  [name] received stereotactic radiosurgery uneventfully to a prescription dose of 20Gy to the following lesions: Left frontal Right orbitofrontal Left brainstem (This lesion was complex because it was within the left brachium pontis / lateral pons) Right occipital  The patient also received 18Gy as a preoperative dose  to Left temporal  The detailed description of the procedure is recorded in the radiation oncology procedure note.  I was present for the duration of the procedure.  DISPOSITION:  Following delivery, the patient was transported to nursing in stable condition and monitored for possible acute effects to be discharged home with surgery planned for the following day.  Consuella Lose, MD [facility]

## 2022-01-03 NOTE — Anesthesia Procedure Notes (Signed)
Procedure Name: Intubation Date/Time: 01/03/2022 8:04 AM  Performed by: Lowella Dell, CRNAPre-anesthesia Checklist: Patient identified, Emergency Drugs available, Suction available and Patient being monitored Patient Re-evaluated:Patient Re-evaluated prior to induction Oxygen Delivery Method: Circle System Utilized Preoxygenation: Pre-oxygenation with 100% oxygen Induction Type: IV induction Ventilation: Mask ventilation without difficulty Laryngoscope Size: Mac and 3 Grade View: Grade III Tube type: Oral Tube size: 7.5 mm Number of attempts: 1 Airway Equipment and Method: Stylet Placement Confirmation: ETT inserted through vocal cords under direct vision, positive ETCO2 and breath sounds checked- equal and bilateral Secured at: 23 cm Tube secured with: Tape Dental Injury: Teeth and Oropharynx as per pre-operative assessment

## 2022-01-03 NOTE — Anesthesia Procedure Notes (Signed)
Arterial Line Insertion Start/End6/30/2023 6:45 AM, 01/03/2022 6:50 AM Performed by: Lowella Dell, CRNA, CRNA  Patient location: Pre-op. Preanesthetic checklist: patient identified, IV checked, site marked, risks and benefits discussed, surgical consent, monitors and equipment checked, pre-op evaluation, timeout performed and anesthesia consent Lidocaine 1% used for infiltration Right, radial was placed Catheter size: 20 G Hand hygiene performed  and maximum sterile barriers used   Attempts: 1 Procedure performed without using ultrasound guided technique. Following insertion, dressing applied and Biopatch. Post procedure assessment: normal and unchanged  Patient tolerated the procedure well with no immediate complications.

## 2022-01-03 NOTE — Transfer of Care (Signed)
Immediate Anesthesia Transfer of Care Note  Patient: Xavier Jones  Procedure(s) Performed: Stereotactic Left temporal craniotomy for resection of tumor (Left: Head) APPLICATION OF CRANIAL NAVIGATION (Head)  Patient Location: PACU  Anesthesia Type:General  Level of Consciousness: awake, alert , oriented and patient cooperative  Airway & Oxygen Therapy: Patient Spontanous Breathing and Patient connected to nasal cannula oxygen  Post-op Assessment: Report given to RN and Post -op Vital signs reviewed and stable  Post vital signs: Reviewed and stable  Last Vitals:  Vitals Value Taken Time  BP 149/81 per Aline   Temp    Pulse 69 01/03/22 1041  Resp 9 01/03/22 1041  SpO2 95 % 01/03/22 1041  Vitals shown include unvalidated device data.  Last Pain:  Vitals:   01/03/22 0611  TempSrc:   PainSc: 2          Complications: No notable events documented.

## 2022-01-03 NOTE — Addendum Note (Signed)
Encounter addended by: Consuella Lose, MD on: 01/03/2022 10:55 AM  Actions taken: Clinical Note Signed

## 2022-01-04 ENCOUNTER — Inpatient Hospital Stay (HOSPITAL_COMMUNITY): Payer: Medicare Other

## 2022-01-04 MED ORDER — CHLORHEXIDINE GLUCONATE CLOTH 2 % EX PADS: 6.0000 | MEDICATED_PAD | Freq: Every morning | CUTANEOUS | Status: DC

## 2022-01-04 MED ORDER — GADOBUTROL 1 MMOL/ML IV SOLN
7.5000 mL | Freq: Once | INTRAVENOUS | Status: AC | PRN
  Administered 2022-01-04: 7.5 mL via INTRAVENOUS

## 2022-01-04 MED ORDER — LEVETIRACETAM 500 MG PO TABS
500.0000 mg | ORAL_TABLET | Freq: Two times a day (BID) | ORAL | Status: DC
  Administered 2022-01-04 – 2022-01-05 (×3): 500 mg via ORAL
  Filled 2022-01-04 (×3): qty 1

## 2022-01-04 MED ORDER — PANTOPRAZOLE SODIUM 40 MG PO TBEC
40.0000 mg | DELAYED_RELEASE_TABLET | Freq: Every day | ORAL | Status: DC
  Administered 2022-01-04: 40 mg via ORAL
  Filled 2022-01-04: qty 1

## 2022-01-04 NOTE — Plan of Care (Signed)
  Problem: Education: Goal: Knowledge of the prescribed therapeutic regimen will improve Outcome: Progressing   Problem: Clinical Measurements: Goal: Usual level of consciousness will be regained or maintained. Outcome: Progressing Goal: Neurologic status will improve Outcome: Progressing Goal: Ability to maintain intracranial pressure will improve Outcome: Progressing   Problem: Skin Integrity: Goal: Demonstration of wound healing without infection will improve Outcome: Progressing   Problem: Education: Goal: Knowledge of General Education information will improve Description: Including pain rating scale, medication(s)/side effects and non-pharmacologic comfort measures Outcome: Progressing   Problem: Health Behavior/Discharge Planning: Goal: Ability to manage health-related needs will improve Outcome: Progressing   Problem: Clinical Measurements: Goal: Ability to maintain clinical measurements within normal limits will improve Outcome: Progressing Goal: Will remain free from infection Outcome: Progressing Goal: Diagnostic test results will improve Outcome: Progressing Goal: Respiratory complications will improve Outcome: Progressing Goal: Cardiovascular complication will be avoided Outcome: Progressing   Problem: Activity: Goal: Risk for activity intolerance will decrease Outcome: Progressing   Problem: Nutrition: Goal: Adequate nutrition will be maintained Outcome: Progressing   Problem: Coping: Goal: Level of anxiety will decrease Outcome: Progressing   Problem: Elimination: Goal: Will not experience complications related to bowel motility Outcome: Progressing Goal: Will not experience complications related to urinary retention Outcome: Progressing   Problem: Pain Managment: Goal: General experience of comfort will improve Outcome: Progressing   Problem: Safety: Goal: Ability to remain free from injury will improve Outcome: Progressing   Problem:  Skin Integrity: Goal: Risk for impaired skin integrity will decrease Outcome: Progressing   

## 2022-01-04 NOTE — Progress Notes (Signed)
Subjective: NAEs o/n  Objective: Vital signs in last 24 hours: Temp:  [97 F (36.1 C)-98.3 F (36.8 C)] 98.3 F (36.8 C) (07/01 0800) Pulse Rate:  [69-95] 95 (07/01 0900) Resp:  [9-26] 13 (07/01 0900) BP: (107-153)/(69-119) 127/85 (07/01 0900) SpO2:  [94 %-98 %] 96 % (07/01 0900) Arterial Line BP: (92-201)/(69-93) 147/80 (07/01 0800)  Intake/Output from previous day: 06/30 0701 - 07/01 0700 In: 3155.7 [I.V.:2745.8; IV Piggyback:409.9] Out: 3650 [Urine:3600; Blood:50] Intake/Output this shift: Total I/O In: 240 [P.O.:240] Out: -   Exam deferred as patient in MRI  Lab Results: No results for input(s): "WBC", "HGB", "HCT", "PLT" in the last 72 hours. BMET No results for input(s): "NA", "K", "CL", "CO2", "GLUCOSE", "BUN", "CREATININE", "CALCIUM" in the last 72 hours.  Studies/Results: No results found.  Assessment/Plan: 51 yo M with   LOS: 1 day  - will review MRI when done.  Likely transfer to floor if unconcerning   Vallarie Mare 01/04/2022, 10:33 AM

## 2022-01-05 MED ORDER — DEXAMETHASONE 1 MG PO TABS: ORAL_TABLET | ORAL | 0 refills | Status: AC

## 2022-01-05 MED ORDER — HYDROCODONE-ACETAMINOPHEN 5-325 MG PO TABS: 1.0000 | ORAL_TABLET | ORAL | 0 refills | Status: DC | PRN

## 2022-01-05 MED ORDER — LEVETIRACETAM 500 MG PO TABS: 500.0000 mg | ORAL_TABLET | Freq: Two times a day (BID) | ORAL | 0 refills | Status: DC

## 2022-01-05 NOTE — Discharge Instructions (Signed)
Walk as much as possible No heavy lifting >10 lbs No strenuous activity  

## 2022-01-05 NOTE — Progress Notes (Signed)
Pt discharged home. PIV removed and room belongings packed. Pt and family educated on discharge information with no questions at this time.   Justice Rocher

## 2022-01-05 NOTE — Discharge Summary (Signed)
   Physician Discharge Summary  Patient ID: Xavier Jones MRN: [id number] DOB/AGE: 52-Apr-1972 52 y.o.  Admit date: 01/03/2022 Discharge date: 01/05/2022  Admission Diagnoses:  Brain mass  Discharge Diagnoses:  Same Principal Problem:   Brain tumor [facility]) Active Problems:   Metastatic cancer to brain [facility])   Discharged Condition: Stable  Hospital Course:  Xavier Jones is a 51 y.o. male with hx of melanoma with metastatic CNS disease who underwent preoperative SRS and elective craniotomy for resection of a left temporal lobe lesion.  Postop, he was admitted to the ICU.  Postop MRI showed good resection.  He was transferred to progressive unit and was mobilized out of bed.  He was tolerating a regular diet and pain well controlled.  He endorsed mild word-finding difficulties which was non-debilitating. He was deemed ready for discharge home on POD#2  Treatments: Surgery - Left Craniotomy for resection of mass  Discharge Exam: Blood pressure 129/83, pulse 83, temperature 98.6 F (37 C), temperature source Oral, resp. rate 18, height '5\' 9"'$  (1.753 m), weight 73.2 kg, SpO2 98 %. Awake, alert, oriented Speech fluent, appropriate CN grossly intact 5/5 BUE/BLE Wound c/d/i  Disposition: Discharge disposition: 01-Home or Self Care        Allergies as of 01/05/2022       Reactions   Testosterone Itching   Gel    Sulfa Antibiotics Rash        Medication List     TAKE these medications    acyclovir 400 MG tablet Commonly known as: ZOVIRAX Take 400 mg by mouth daily.   amitriptyline 25 MG tablet Commonly known as: ELAVIL Take 50 mg by mouth at bedtime.   dexamethasone 1 MG tablet Commonly known as: DECADRON Take 4 tablets (4 mg total) by mouth every 8 (eight) hours for 2 days, THEN 2 tablets (2 mg total) every 8 (eight) hours for 2 days, THEN 2 tablets (2 mg total) every 12 (twelve) hours for 2 days, THEN 1 tablet (1 mg total) every 12 (twelve) hours for 2 days,  THEN 1 tablet (1 mg total) daily for 2 days. Start taking on: January 05, 2022 What changed:  medication strength See the new instructions.   ergocalciferol 1.25 MG (50000 UT) capsule Commonly known as: VITAMIN D2 Take 50,000 Units by mouth every Wednesday.   HYDROcodone-acetaminophen 5-325 MG tablet Commonly known as: NORCO/VICODIN Take 1 tablet by mouth every 4 (four) hours as needed for moderate pain.   levETIRAcetam 500 MG tablet Commonly known as: KEPPRA Take 1 tablet (500 mg total) by mouth 2 (two) times daily.        Follow-up Information     Consuella Lose, MD Follow up.   Specialty: Neurosurgery Contact information: [address] [place] [address] [place] [address] [phone]                 Signed: Vallarie Mare 01/05/2022, 9:52 AM

## 2022-01-05 NOTE — Progress Notes (Signed)
5 staples holding telfa dressing removed. Incision now open to air with staples intact, per order.   Justice Rocher, RN

## 2022-01-06 ENCOUNTER — Encounter (HOSPITAL_COMMUNITY): Payer: Self-pay | Admitting: Neurosurgery

## 2022-01-06 LAB — BPAM RBC
Blood Product Expiration Date: 202307052359
Blood Product Expiration Date: 202307262359
ISSUE DATE / TIME: 202306300927
ISSUE DATE / TIME: 202306300927
Unit Type and Rh: 5100
Unit Type and Rh: 9500

## 2022-01-06 LAB — TYPE AND SCREEN
ABO/RH(D): O POS
Antibody Screen: NEGATIVE
Unit division: 0
Unit division: 0

## 2022-01-08 LAB — SURGICAL PATHOLOGY

## 2022-01-12 NOTE — Progress Notes (Signed)
Patient Care Team: Earlyne Iba, NP as PCP - General (Nurse Practitioner) Derwood Kaplan, MD as Consulting Physician (Oncology) Gatha Mayer, MD as Consulting Physician (Radiation Oncology)  Clinic Day: 01/14/22  Referring physician: Earlyne Iba, NP  ASSESSMENT & PLAN:   Assessment & Plan: Melanoma of skin ([place]) Stage IIB malignant melanoma of the right posterior shoulder, Breslow's depth 3.3 mm, Clark level IV, treated with wide local excision, diagnosed in May 2021.  There was still a question as to whether this represented a metastatic lesion, rather than a primary, based on the presence of 2 nodules and no epidermal involvement.  MRI head and CT chest, abdomen and pelvis in June were negative for metastatic disease.   No adjuvant therapy was recommended.  PET scan in September was negative for metastatic disease. Chest x-ray in June 2022 was negative. He then developed recurrent disease in June of 2023.  Metastasis to subcutaneous tissue  He presented with a new area of concern to the left mid back in June of this year. A superficial nodule is noted approximately 1 cm to the right of his upper thoracic spine. He cannot fully assess the area due to location, but feels like it has increased in size. There is no redness or warmth noted to the area.  He was sent for biopsy and that was performed on June 13 and confirmed metastatic malignant melanoma.  Melan-A was positive S100 was positive SOX10 was positive and PRAME positive.  We will order BRAF testing and a melanoma panel through [facility].  He has several hypermetabolic subcutaneous nodules seen.  Metastasis to liver He has a small hypermetabolic liver lesion which is new and less than 1 cm but suspicious for metastasis.  Metastasis to lymph nodes He has multiple hypermetabolic nodes of the retroperitoneal and right pelvic area as well as right external iliac and a few additional nodes.  Pulmonary metastases He has  bilateral hypermetabolic pulmonary nodules which are increased in size compared with the CT chest performed in May of this year.  The largest nodule is up to 2.3 cm in diameter.  Metastases to brain This was suspicious on the PET scan in the left frontoparietal lobe and so he had an MRI of the brain.  This confirmed a 2.6 cm heterogeneous lesion of the left superior temporal gyrus with mild enhancement and extensive surrounding edema.  He also has an 8 mm cortical lesion of the inferomedial right frontal lobe with mild edema, a 2 mm mildly enhancing cortical lesion of the inferolateral left frontal lobe and a intrinsically T1 hyperintense lesion of the left lateral pons measuring 7 mm with surrounding edema.  He has 4 lesions in total but 3 are less than 1 cm. He has now had resection of the largest tumor of the left temporal area by Dr. Kathyrn Sheriff last Friday and the other tumors were treated with SRS. He is 10 days postop so needs a little more time to heal.   Multiple dysplastic nevi Multiple dysplastic nevi of the right lower back with severe atypia.  The nevus of the medial back had positive peripheral margins, so he underwent re-excision of this area as well, which only revealed dermal scar. Dysplastic compound nevus of the left mid buttock with moderate atypia.  Margins were free of neoplasm.     Family history of breast cancer Maternal family history of breast cancer in addition to his melanoma history which raises suspicion for possible BRCA mutation.  He met with  the genetic counselors and [facility] RNA + hereditary cancer panel revealed variants of uncertain significance (VUS) of the APC, CDKN1C and FH genes.     I met with the patient today and he is healing well after his intracranial surgery and SRS. He has some mild expressive dysphasia and some headaches, but is otherwise asymptomatic. He has hydrocodone as needed for pain.  I did also explain that I would recommend immunotherapy, with a  combination of ipilimumab and nivolumab for 4 cycles followed by maintenance nivolumab.  We should be able to start in about 2 weeks. I will recommend a port placement and I will ask Dr. Coralie Keens to do that.  I have answered his questions and reviewed this information in detail.He will also have a formal chemotherapy education session with Lenna Sciara, NP. The patient understands the plans discussed today and is in agreement with them.  He knows to contact our office if he develops concerns prior to his next appointment.    Derwood Kaplan, MD  [facility] [address] [place] [address] Dept: [phone] Dept Fax: [phone]   Orders Placed This Encounter  Procedures   CBC and differential    This external order was created through the Results Console.   CBC    This external order was created through the Results Console.   Basic metabolic panel    This external order was created through the Results Console.   Comprehensive metabolic panel    This external order was created through the Results Console.   Hepatic function panel    This external order was created through the Results Console.      CHIEF COMPLAINT:  CC: Metastatic melanoma  Current Treatment: Corticosteroids and brain irradiation, followed by immunotherapy  HISTORY OF PRESENT ILLNESS:  Xavier Jones is here today for repeat clinical assessment.  He had presented in May with a new nodule of his back in the subcutaneous tissue and biopsy confirmed that this is metastatic malignant melanoma.  He has now had staging and CT of the chest confirms multiple bilateral pulmonary nodules as well as for subcutaneous nodules of the back and at least 20 pulmonary nodules.  He was then sent for a PET scan which revealed additional metastatic disease including a suspicious area of the left frontoparietal lobe.  He also has a small liver lesion measuring 6 mm but with  hypermetabolic uptake and so suspicious for metastatic disease.  He has adenopathy of the retroperitoneum and right pelvic nodes which are also hypermetabolic in addition to a node inferior to the left kidney, right external iliac node, and left paraspinal node.  Multiple hypermetabolic subcutaneous soft tissue nodules are confirmed to be present with one of the left pelvis measuring 2.3 cm.  He was then sent for an MRI of the brain which confirmed for lesions.  The main 1 was a 2.6 cm heterogeneous left superior temporal gyrus lesion with mild enhancement and extensive surrounding edema.  He also has a 7 mm lesion at the left lateral pons, a mildly enhancing 8 mm lesion of the inferomedial right frontal lobe and a 2 mm enhancing cortical lesion of the inferolateral left frontal lobe.  His only symptom is occasional expressive aphasia and difficulty with word finding.  He does have some anxiety but denies depression.  He has had some nausea and has chronic back and shoulder pain.  He lives alone and does not have close family but his  boss has been very helpful to him.    INTERVAL HISTORY: He has now had resection of the left temporal lesion 10 days ago by Dr. Kathyrn Sheriff and the 20 staples will come out tomorrow. His other tumors have been treated with SRS.  He still has headaches and uses hydrocodone about every 4 hours.  I will request BRAF testing of the tumor. I now recommend immunotherapy to start in 2 weeks for systemic treatment with ipilimumab and nivolumab. I reviewed the schedule and potential toxicities and he will have a formal chemotherapy education session as well.  I will ask Dr. Coralie Keens to place a port. We will plan close follow-up during treatment. He denies nausea, vomiting or bowel problems.  He denies cardiorespiratory symptoms. He denies fevers, chills or signs of infection. CBC and CMP are normal.  I have reviewed the past medical history, past surgical history, social history and family  history with the patient and they are unchanged from previous note.  ALLERGIES:  is allergic to testosterone and sulfa antibiotics.  MEDICATIONS:  Current Outpatient Medications  Medication Sig Dispense Refill   acyclovir (ZOVIRAX) 200 MG capsule Take by mouth.     amitriptyline (ELAVIL) 25 MG tablet Take 50 mg by mouth at bedtime.     ergocalciferol (VITAMIN D2) 1.25 MG (50000 UT) capsule Take 50,000 Units by mouth every Wednesday.     hydrOXYzine (ATARAX) 25 MG tablet Take 1 tablet (25 mg total) by mouth 3 (three) times daily as needed for itching. 60 tablet 3   ondansetron (ZOFRAN) 4 MG tablet Take 1 tablet (4 mg total) by mouth every 4 (four) hours as needed for nausea. 90 tablet 3   prochlorperazine (COMPAZINE) 10 MG tablet Take 1 tablet (10 mg total) by mouth every 6 (six) hours as needed for nausea or vomiting. 90 tablet 3   No current facility-administered medications for this visit.    HISTORY OF PRESENT ILLNESS:   Oncology History  Melanoma of skin ([place])  12/01/2019 Initial Diagnosis   Melanoma of skin ([place])   12/05/2019 Cancer Staging   Staging form: Melanoma of the Skin, AJCC 8th Edition - Clinical stage from 12/05/2019: Stage IIA (cT3a, cN0, cM0) - Signed by Derwood Kaplan, MD on 05/30/2020   02/18/2021 Genetic Testing   No pathogenic variants detected in [facility] Multi-Cancer +RNA Panel.  Variants of uncertain significance detected in APC (c.681C>G (p.Asp227Glu)), CDKN1C (c.610C>G (p.Pro204Ala)), and FH (c.1151C>T (p.Ala384Val)).  The report date is February 18, 2021.    The Multi-Cancer + RNA Panel offered by [facility] includes sequencing and/or deletion/duplication analysis of the following 84 genes:  AIP*, ALK, APC*, ATM*, AXIN2*, BAP1*, BARD1*, BLM*, BMPR1A*, BRCA1*, BRCA2*, BRIP1*, CASR, CDC73*, CDH1*, CDK4, CDKN1B*, CDKN1C*, CDKN2A, CEBPA, CHEK2*, CTNNA1*, DICER1*, DIS3L2*, EGFR, EPCAM, FH*, FLCN*, GATA2*, GPC3, GREM1, HOXB13, HRAS, KIT, MAX*, MEN1*, MET, MITF, MLH1*,  MSH2*, MSH3*, MSH6*, MUTYH*, NBN*, NF1*, NF2*, NTHL1*, PALB2*, PDGFRA, PHOX2B, PMS2*, POLD1*, POLE*, POT1*, PRKAR1A*, PTCH1*, PTEN*, RAD50*, RAD51C*, RAD51D*, RB1*, RECQL4, RET, RUNX1*, SDHA*, SDHAF2*, SDHB*, SDHC*, SDHD*, SMAD4*, SMARCA4*, SMARCB1*, SMARCE1*, STK11*, SUFU*, TERC, TERT, TMEM127*, Tp53*, TSC1*, TSC2*, VHL*, WRN*, and WT1.  RNA analysis is performed for * genes.   01/30/2022 -  Chemotherapy   Patient is on Treatment Plan : MELANOMA Nivolumab + Ipilimumab (1/3) q21d / Nivolumab q28d     Malignant melanoma metastatic to brain ([place])  12/19/2021 Initial Diagnosis   Malignant melanoma metastatic to brain [facility])   01/30/2022 -  Chemotherapy   Patient is on Treatment  Plan : MELANOMA Nivolumab + Ipilimumab (1/3) q21d / Nivolumab q28d     Malignant melanoma metastatic to lung ([place])  12/19/2021 Initial Diagnosis   Malignant melanoma metastatic to lung ([place])   01/30/2022 -  Chemotherapy   Patient is on Treatment Plan : MELANOMA Nivolumab + Ipilimumab (1/3) q21d / Nivolumab q28d     Malignant neoplasm metastatic to liver ([place])  12/19/2021 Initial Diagnosis   Malignant neoplasm metastatic to liver ([place])   01/30/2022 -  Chemotherapy   Patient is on Treatment Plan : MELANOMA Nivolumab + Ipilimumab (1/3) q21d / Nivolumab q28d         REVIEW OF SYSTEMS:   Constitutional: Denies fevers, chills or abnormal weight loss Eyes: Denies blurriness of vision Ears, nose, mouth, throat, and face: Denies mucositis or sore throat Respiratory: Denies cough, dyspnea or wheezes Cardiovascular: Denies palpitation, chest discomfort or lower extremity swelling Gastrointestinal:  Denies nausea, heartburn or change in bowel habits Skin: Denies abnormal skin rashes Lymphatics: Denies new lymphadenopathy or easy bruising Neurological:Denies numbness, tingling or new weaknesses Behavioral/Psych: Mood is stable, no new changes  All other systems were reviewed with the patient and are negative.   VITALS:   Blood pressure (!) 148/91, pulse 87, temperature 98.2 F (36.8 C), temperature source Oral, resp. rate 18, height 5' 9"  (1.753 m), weight 161 lb 6.4 oz (73.2 kg), SpO2 97 %.  Wt Readings from Last 3 Encounters:  02/10/22 167 lb 6.4 oz (75.9 kg)  02/04/22 161 lb 14.4 oz (73.4 kg)  01/30/22 161 lb 1.3 oz (73.1 kg)    Body mass index is 23.83 kg/m.  Performance status (ECOG): 1 - Symptomatic but completely ambulatory  PHYSICAL EXAM:   GENERAL:alert, no distress and comfortable SKIN: skin color, texture, turgor are normal, no rashes or significant lesions.  His left temporal incision is healing well, with staples still in place. Approx. 1 cm superficial nodule noted to right mid thoracic He has an additional nodule of the left flank which is smaller but firm. EYES: normal, Conjunctiva are pink and non-injected, sclera clear OROPHARYNX:no exudate, no erythema and lips, buccal mucosa, and tongue normal  NECK: supple, thyroid normal size, non-tender, without nodularity LYMPH:  no palpable lymphadenopathy in the cervical, axillary or inguinal LUNGS: clear to auscultation and percussion with normal breathing effort HEART: regular rate & rhythm with tachycardia, and no lower extremity edema ABDOMEN:abdomen soft, non-tender and normal bowel sounds Musculoskeletal:no cyanosis of digits and no clubbing  NEURO: alert & oriented x 3 with fluent speech, no focal motor/sensory deficits  LABORATORY DATA:  I have reviewed the data as listed    Component Value Date/Time   NA 136 (A) 01/14/2022 0000   K 4.6 01/14/2022 0000   CL 100 01/14/2022 0000   CO2 30 (A) 01/14/2022 0000   BUN 15 01/14/2022 0000   CREATININE 0.7 01/14/2022 0000   CALCIUM 9.6 01/14/2022 0000   ALBUMIN 4.4 01/14/2022 0000   AST 31 01/14/2022 0000   ALT 26 01/14/2022 0000   ALKPHOS 76 01/14/2022 0000    No results found for: "SPEP", "UPEP"  Lab Results  Component Value Date   WBC 5.2 02/04/2022   NEUTROABS 3.74  02/04/2022   HGB 13.6 02/04/2022   HCT 40 (A) 02/04/2022   MCV 97 (A) 07/31/2021   PLT 379 02/04/2022      Chemistry      Component Value Date/Time   NA 136 (A) 01/14/2022 0000   K 4.6 01/14/2022 0000   CL  100 01/14/2022 0000   CO2 30 (A) 01/14/2022 0000   BUN 15 01/14/2022 0000   CREATININE 0.7 01/14/2022 0000   GLU 97 01/14/2022 0000      Component Value Date/Time   CALCIUM 9.6 01/14/2022 0000   ALKPHOS 76 01/14/2022 0000   AST 31 01/14/2022 0000   ALT 26 01/14/2022 0000       RADIOGRAPHIC STUDIES: No results found.

## 2022-01-14 ENCOUNTER — Inpatient Hospital Stay: Payer: Medicare Other

## 2022-01-14 ENCOUNTER — Encounter: Payer: Self-pay | Admitting: Oncology

## 2022-01-14 ENCOUNTER — Inpatient Hospital Stay: Payer: Medicare Other | Attending: Hematology and Oncology | Admitting: Oncology

## 2022-01-14 ENCOUNTER — Other Ambulatory Visit: Payer: Self-pay | Admitting: Oncology

## 2022-01-14 VITALS — BP 148/91 | HR 87 | Temp 98.2°F | Resp 18 | Ht 69.0 in | Wt 161.4 lb

## 2022-01-14 DIAGNOSIS — C7931 Secondary malignant neoplasm of brain: Secondary | ICD-10-CM | POA: Diagnosis not present

## 2022-01-14 DIAGNOSIS — Z87891 Personal history of nicotine dependence: Secondary | ICD-10-CM | POA: Insufficient documentation

## 2022-01-14 DIAGNOSIS — C439 Malignant melanoma of skin, unspecified: Secondary | ICD-10-CM | POA: Diagnosis not present

## 2022-01-14 DIAGNOSIS — C4361 Malignant melanoma of right upper limb, including shoulder: Secondary | ICD-10-CM | POA: Insufficient documentation

## 2022-01-14 DIAGNOSIS — C787 Secondary malignant neoplasm of liver and intrahepatic bile duct: Secondary | ICD-10-CM

## 2022-01-14 DIAGNOSIS — C778 Secondary and unspecified malignant neoplasm of lymph nodes of multiple regions: Secondary | ICD-10-CM

## 2022-01-14 DIAGNOSIS — C7800 Secondary malignant neoplasm of unspecified lung: Secondary | ICD-10-CM | POA: Diagnosis not present

## 2022-01-14 DIAGNOSIS — Z79899 Other long term (current) drug therapy: Secondary | ICD-10-CM | POA: Insufficient documentation

## 2022-01-14 DIAGNOSIS — Z5112 Encounter for antineoplastic immunotherapy: Secondary | ICD-10-CM | POA: Insufficient documentation

## 2022-01-14 LAB — HEPATIC FUNCTION PANEL
ALT: 26 U/L (ref 10–40)
AST: 31 (ref 14–40)
Alkaline Phosphatase: 76 (ref 25–125)
Bilirubin, Total: 0.6

## 2022-01-14 LAB — BASIC METABOLIC PANEL
BUN: 15 (ref 4–21)
CO2: 30 — AB (ref 13–22)
Chloride: 100 (ref 99–108)
Creatinine: 0.7 (ref 0.6–1.3)
Glucose: 97
Potassium: 4.6 mEq/L (ref 3.5–5.1)
Sodium: 136 — AB (ref 137–147)

## 2022-01-14 LAB — CBC AND DIFFERENTIAL
HCT: 44 (ref 41–53)
Hemoglobin: 14.8 (ref 13.5–17.5)
Neutrophils Absolute: 7.48
Platelets: 361 10*3/uL (ref 150–400)
WBC: 8.7

## 2022-01-14 LAB — COMPREHENSIVE METABOLIC PANEL
Albumin: 4.4 (ref 3.5–5.0)
Calcium: 9.6 (ref 8.7–10.7)

## 2022-01-14 LAB — CBC: RBC: 4.48 (ref 3.87–5.11)

## 2022-01-14 NOTE — Progress Notes (Signed)
START ON PATHWAY REGIMEN - Melanoma and Other Skin Cancers     Cycles 1 through 4: A cycle is every 21 days:     Nivolumab      Ipilimumab    Cycles 5 and beyond: A cycle is every 28 days:     Nivolumab   **Always confirm dose/schedule in your pharmacy ordering system**  Patient Characteristics: Melanoma, Cutaneous/Unknown Primary, Distant Metastases, Unresectable, Brain Metastases, First Line, BRAF V600 Wild Type / BRAF V600 Results Pending or Unknown Disease Classification: Melanoma Disease Subtype: Cutaneous BRAF V600 Mutation Status: Awaiting BRAF V600 Results Therapeutic Status: Distant Metastases Line of therapy: First Line Intent of Therapy: Non-Curative / Palliative Intent, Discussed with Patient 

## 2022-01-15 ENCOUNTER — Encounter: Payer: Self-pay | Admitting: Oncology

## 2022-01-15 ENCOUNTER — Telehealth: Payer: Self-pay

## 2022-01-15 NOTE — Telephone Encounter (Signed)
Called Dr. Carlos Levering office to get appointment for port placement. Appointment on 01/16/22 at 0800. Patient called and notified.

## 2022-01-15 NOTE — Telephone Encounter (Signed)
-----   Message from Derwood Kaplan, MD sent at 01/14/2022 12:13 PM EDT ----- Regarding: refer Pls refer Dr. Coralie Keens for port, he has seen before

## 2022-01-16 DIAGNOSIS — C7989 Secondary malignant neoplasm of other specified sites: Secondary | ICD-10-CM | POA: Diagnosis not present

## 2022-01-16 DIAGNOSIS — C439 Malignant melanoma of skin, unspecified: Secondary | ICD-10-CM | POA: Diagnosis not present

## 2022-01-17 ENCOUNTER — Encounter: Payer: Self-pay | Admitting: Hematology and Oncology

## 2022-01-20 ENCOUNTER — Encounter: Payer: Self-pay | Admitting: Hematology and Oncology

## 2022-01-21 DIAGNOSIS — Z85820 Personal history of malignant melanoma of skin: Secondary | ICD-10-CM | POA: Diagnosis not present

## 2022-01-21 DIAGNOSIS — C4359 Malignant melanoma of other part of trunk: Secondary | ICD-10-CM | POA: Diagnosis not present

## 2022-01-22 ENCOUNTER — Encounter: Payer: Self-pay | Admitting: Oncology

## 2022-01-23 ENCOUNTER — Other Ambulatory Visit: Payer: Self-pay

## 2022-01-23 ENCOUNTER — Other Ambulatory Visit: Payer: Self-pay | Admitting: Pharmacist

## 2022-01-24 DIAGNOSIS — Z452 Encounter for adjustment and management of vascular access device: Secondary | ICD-10-CM | POA: Diagnosis not present

## 2022-01-24 DIAGNOSIS — R918 Other nonspecific abnormal finding of lung field: Secondary | ICD-10-CM | POA: Diagnosis not present

## 2022-01-24 DIAGNOSIS — C4361 Malignant melanoma of right upper limb, including shoulder: Secondary | ICD-10-CM | POA: Diagnosis not present

## 2022-01-24 DIAGNOSIS — J984 Other disorders of lung: Secondary | ICD-10-CM | POA: Diagnosis not present

## 2022-01-24 DIAGNOSIS — C439 Malignant melanoma of skin, unspecified: Secondary | ICD-10-CM | POA: Diagnosis not present

## 2022-01-24 DIAGNOSIS — C7931 Secondary malignant neoplasm of brain: Secondary | ICD-10-CM | POA: Diagnosis not present

## 2022-01-26 ENCOUNTER — Encounter: Payer: Self-pay | Admitting: Oncology

## 2022-01-27 ENCOUNTER — Other Ambulatory Visit: Payer: Self-pay

## 2022-01-28 ENCOUNTER — Other Ambulatory Visit: Payer: 59

## 2022-01-28 ENCOUNTER — Inpatient Hospital Stay (INDEPENDENT_AMBULATORY_CARE_PROVIDER_SITE_OTHER): Payer: Medicare Other | Admitting: Hematology and Oncology

## 2022-01-28 ENCOUNTER — Encounter: Payer: Self-pay | Admitting: Hematology and Oncology

## 2022-01-28 ENCOUNTER — Inpatient Hospital Stay: Payer: Medicare Other

## 2022-01-28 ENCOUNTER — Other Ambulatory Visit: Payer: Self-pay | Admitting: Hematology and Oncology

## 2022-01-28 ENCOUNTER — Telehealth: Payer: Self-pay | Admitting: *Deleted

## 2022-01-28 ENCOUNTER — Ambulatory Visit: Payer: 59 | Admitting: Hematology and Oncology

## 2022-01-28 VITALS — BP 138/93 | HR 95 | Temp 98.4°F | Resp 20 | Ht 69.2 in | Wt 161.1 lb

## 2022-01-28 DIAGNOSIS — C787 Secondary malignant neoplasm of liver and intrahepatic bile duct: Secondary | ICD-10-CM | POA: Diagnosis not present

## 2022-01-28 DIAGNOSIS — Z87891 Personal history of nicotine dependence: Secondary | ICD-10-CM | POA: Diagnosis not present

## 2022-01-28 DIAGNOSIS — C4361 Malignant melanoma of right upper limb, including shoulder: Secondary | ICD-10-CM | POA: Diagnosis not present

## 2022-01-28 DIAGNOSIS — Z5112 Encounter for antineoplastic immunotherapy: Secondary | ICD-10-CM | POA: Diagnosis not present

## 2022-01-28 DIAGNOSIS — Z79899 Other long term (current) drug therapy: Secondary | ICD-10-CM | POA: Diagnosis not present

## 2022-01-28 DIAGNOSIS — C439 Malignant melanoma of skin, unspecified: Secondary | ICD-10-CM | POA: Diagnosis not present

## 2022-01-28 DIAGNOSIS — C778 Secondary and unspecified malignant neoplasm of lymph nodes of multiple regions: Secondary | ICD-10-CM | POA: Diagnosis not present

## 2022-01-28 DIAGNOSIS — C7931 Secondary malignant neoplasm of brain: Secondary | ICD-10-CM | POA: Diagnosis not present

## 2022-01-28 LAB — TSH: TSH: 1.02 u[IU]/mL (ref 0.350–4.500)

## 2022-01-28 MED ORDER — PROCHLORPERAZINE MALEATE 10 MG PO TABS: 10.0000 mg | ORAL_TABLET | Freq: Four times a day (QID) | ORAL | 3 refills | Status: DC | PRN

## 2022-01-28 MED ORDER — ONDANSETRON HCL 4 MG PO TABS: 4.0000 mg | ORAL_TABLET | ORAL | 3 refills | Status: DC | PRN

## 2022-01-28 NOTE — Telephone Encounter (Signed)
CALLED PATIENT TO ASK ABOUT WHICH DAY THAT HE WOULD LIKE TO RECEIVE A CALL FROM [name] BRUNING FOR TELEPHONE FU, PATIENT AGREED TO HAVE HER CALL ON 01/31/22 @ 10:30 AM

## 2022-01-28 NOTE — Progress Notes (Signed)
[place]  [phone] [phone]  Patient Care Team: Earlyne Iba, NP as PCP - General (Nurse Practitioner) Gatha Mayer, MD as Consulting Physician (Radiation Oncology)   Name of the patient: Xavier Jones  [id number]  09-Aug-1970   Date of visit: 01/28/22  Diagnosis- Malignant Melanoma  Chief complaint/Reason for visit- Initial Meeting for [facility], preparing for starting chemotherapy   Heme/Onc history:  Oncology History  Melanoma of skin ([place])  12/01/2019 Initial Diagnosis   Melanoma of skin ([place])   12/05/2019 Cancer Staging   Staging form: Melanoma of the Skin, AJCC 8th Edition - Clinical stage from 12/05/2019: Stage IIA (cT3a, cN0, cM0) - Signed by Derwood Kaplan, MD on 05/30/2020   02/18/2021 Genetic Testing   No pathogenic variants detected in [facility] Multi-Cancer +RNA Panel.  Variants of uncertain significance detected in APC (c.681C>G (p.Asp227Glu)), CDKN1C (c.610C>G (p.Pro204Ala)), and FH (c.1151C>T (p.Ala384Val)).  The report date is February 18, 2021.    The Multi-Cancer + RNA Panel offered by [facility] includes sequencing and/or deletion/duplication analysis of the following 84 genes:  AIP*, ALK, APC*, ATM*, AXIN2*, BAP1*, BARD1*, BLM*, BMPR1A*, BRCA1*, BRCA2*, BRIP1*, CASR, CDC73*, CDH1*, CDK4, CDKN1B*, CDKN1C*, CDKN2A, CEBPA, CHEK2*, CTNNA1*, DICER1*, DIS3L2*, EGFR, EPCAM, FH*, FLCN*, GATA2*, GPC3, GREM1, HOXB13, HRAS, KIT, MAX*, MEN1*, MET, MITF, MLH1*, MSH2*, MSH3*, MSH6*, MUTYH*, NBN*, NF1*, NF2*, NTHL1*, PALB2*, PDGFRA, PHOX2B, PMS2*, POLD1*, POLE*, POT1*, PRKAR1A*, PTCH1*, PTEN*, RAD50*, RAD51C*, RAD51D*, RB1*, RECQL4, RET, RUNX1*, SDHA*, SDHAF2*, SDHB*, SDHC*, SDHD*, SMAD4*, SMARCA4*, SMARCB1*, SMARCE1*, STK11*, SUFU*, TERC, TERT, TMEM127*, Tp53*, TSC1*, TSC2*, VHL*, WRN*, and WT1.  RNA analysis is performed for * genes.   01/30/2022 -  Chemotherapy   Patient is on Treatment  Plan : MELANOMA Nivolumab + Ipilimumab (1/3) q21d / Nivolumab q28d     Malignant melanoma metastatic to brain ([place])  12/19/2021 Initial Diagnosis   Malignant melanoma metastatic to brain ([place])   01/30/2022 -  Chemotherapy   Patient is on Treatment Plan : MELANOMA Nivolumab + Ipilimumab (1/3) q21d / Nivolumab q28d     Malignant melanoma metastatic to lung ([place])  12/19/2021 Initial Diagnosis   Malignant melanoma metastatic to lung ([place])   01/30/2022 -  Chemotherapy   Patient is on Treatment Plan : MELANOMA Nivolumab + Ipilimumab (1/3) q21d / Nivolumab q28d     Malignant neoplasm metastatic to liver ([place])  12/19/2021 Initial Diagnosis   Malignant neoplasm metastatic to liver ([place])   01/30/2022 -  Chemotherapy   Patient is on Treatment Plan : MELANOMA Nivolumab + Ipilimumab (1/3) q21d / Nivolumab q28d       Interval history-  Patient presents to chemo care clinic today for initial meeting in preparation for starting chemotherapy. I introduced the chemo care clinic and we discussed that the role of the clinic is to assist those who are at an increased risk of emergency room visits and/or complications during the course of chemotherapy treatment. We discussed that the increased risk takes into account factors such as age, performance status, and co-morbidities. We also discussed that for some, this might include barriers to care such as not having a primary care provider, lack of insurance/transportation, or not being able to afford medications. We discussed that the goal of the program is to help prevent unplanned ER visits and help reduce complications during chemotherapy. We do this by discussing specific risk factors to each individual and identifying ways that we can help improve these risk factors and reduce barriers  to care.   Allergies  Allergen Reactions   Testosterone Itching    Gel    Sulfa Antibiotics Rash    Past Medical History:  Diagnosis Date   Depression    mild   Family  history of breast cancer 01/22/2021   Family history of prostate cancer 01/22/2021   Genital herpes    History of back injury 2003   Skin cancer    melanoma    Past Surgical History:  Procedure Laterality Date   APPLICATION OF CRANIAL NAVIGATION N/A 01/03/2022   Procedure: APPLICATION OF CRANIAL NAVIGATION;  Surgeon: Consuella Lose, MD;  Location: [place];  Service: Neurosurgery;  Laterality: N/A;   CRANIOTOMY Left 01/03/2022   Procedure: Stereotactic Left temporal craniotomy for resection of tumor;  Surgeon: Consuella Lose, MD;  Location: [facility];  Service: Neurosurgery;  Laterality: Left;   MELANOMA EXCISION WITH SENTINEL LYMPH NODE BIOPSY Right 2021   SPINAL FUSION W/ LUQUE UNIT ROD  2003    Social History   Socioeconomic History   Marital status: Divorced    Spouse name: Not on file   Number of children: 0   Years of education: Not on file   Highest education level: Not on file  Occupational History   Not on file  Tobacco Use   Smoking status: Former    Types: Cigarettes    Quit date: 1998    Years since quitting: 25.5   Smokeless tobacco: Never  Vaping Use   Vaping Use: Never used  Substance and Sexual Activity   Alcohol use: Yes    Alcohol/week: 14.0 standard drinks of alcohol    Types: 14 Cans of beer per week    Comment: 2-3 beers per evening   Drug use: Not Currently   Sexual activity: Not on file  Other Topics Concern   Not on file  Social History Narrative   Not on file   Social Determinants of Health   Financial Resource Strain: Not on file  Food Insecurity: Not on file  Transportation Needs: Not on file  Physical Activity: Not on file  Stress: Not on file  Social Connections: Not on file  Intimate Partner Violence: Not on file    Family History  Problem Relation Age of Onset   Prostate cancer Father        dx 27s   Breast cancer Maternal Aunt        dx after 77; bilateral mastectomy   Stomach cancer Maternal Grandfather        dx 53s    Breast cancer Cousin 22       maternal male cousin; mets     Current Outpatient Medications:    acyclovir (ZOVIRAX) 200 MG capsule, Take by mouth., Disp: , Rfl:    amitriptyline (ELAVIL) 25 MG tablet, Take 50 mg by mouth at bedtime., Disp: , Rfl:    ergocalciferol (VITAMIN D2) 1.25 MG (50000 UT) capsule, Take 50,000 Units by mouth every Wednesday., Disp: , Rfl:    HYDROcodone-acetaminophen (NORCO) 10-325 MG tablet, Take 1 tablet by mouth 4 (four) times daily as needed., Disp: , Rfl:      Latest Ref Rng & Units 01/14/2022   12:00 AM  CMP  BUN 4 - 21 15      Creatinine 0.6 - 1.3 0.7      Sodium 137 - 147 136      Potassium 3.5 - 5.1 mEq/L 4.6      Chloride 99 - 108 100  CO2 13 - 22 30      Calcium 8.7 - 10.7 9.6      Alkaline Phos 25 - 125 76      AST 14 - 40 31      ALT 10 - 40 U/L 26         This result is from an external source.      Latest Ref Rng & Units 01/14/2022   12:00 AM  CBC  WBC  8.7      Hemoglobin 13.5 - 17.5 14.8      Hematocrit 41 - 53 44      Platelets 150 - 400 K/uL 361         This result is from an external source.    No images are attached to the encounter.  MR BRAIN W WO CONTRAST  Result Date: 01/04/2022 CLINICAL DATA:  51 year old male with melanoma, brain metastases. Postoperative day 1 stereotactic left temporal craniotomy and resection of tumor. EXAM: MRI HEAD WITHOUT AND WITH CONTRAST TECHNIQUE: Multiplanar, multiecho pulse sequences of the brain and surrounding structures were obtained without and with intravenous contrast. CONTRAST:  7.23m GADAVIST GADOBUTROL 1 MMOL/ML IV SOLN COMPARISON:  Preoperative brain MRI 12/26/2021 and earlier. FINDINGS: Brain: New left side craniotomy. Smooth new widespread left hemisphere pachymeningeal thickening is probably postoperative in nature (series 11, image 17). Posterior left temporal/operculum resection cavity with heterogeneous internal contents, both T1 intrinsic and isointense. Some evidence of  marginal diffusion restriction (series 2, image 27). No definite enhancement following contrast. Regional vasogenic edema and mass effect are mildly reduced. T1 intrinsic left brainstem metastasis near the 7th/8th cranial nerve root entry zone is stable at 10 mm. Regional brainstem and left cerebellar peduncle edema does appear mildly increased on series 6, image 9. Right inferior frontal gyrus T1 hypointense and enhancing metastasis is stable measuring 10 mm (series 10, image 41). Stable mild regional edema. Nearby small contralateral left inferior frontal gyrus metastasis measuring 3-4 mm is stable on series 10, image 41, no edema. No new brain metastasis identified. Stable ventricle size and configuration. No other restricted diffusion. Cervicomedullary junction and pituitary are within normal limits. Normal basilar cisterns. Occasional small nonspecific cerebral white matter T2 and FLAIR hyperintense foci are stable. Vascular: Major intracranial vascular flow voids are stable. The major dural venous sinuses are enhancing and appear to be patent. Skull and upper cervical spine: Craniotomy. Negative visible cervical spine and spinal cord. Visualized bone marrow signal is within normal limits. Sinuses/Orbits: Stable and negative. Other: Mastoids are clear. Visible internal auditory structures appear normal. Postoperative changes to the left scalp. IMPRESSION: 1. New left side craniotomy and gross total resection of left temporal/operculum metastasis. Mild resection margin ischemia is possible. Stable regional edema and mass effect. Mild smooth left hemisphere meningeal thickening is likely postoperative in nature. 2. The other three smaller brain metastases are stable, aside from mildly increased associated brainstem and left cerebellar peduncle edema. 3. No new brain metastasis identified. Electronically Signed   By: HGenevie AnnM.D.   On: 01/04/2022 10:54     Assessment and plan- Patient is a 51y.o. male [facility] starting chemotherapy for the treatment of malignant melanoma.   Chemo Care Clinic/High Risk for ER/Hospitalization during chemotherapy- We discussed the role of the chemo care clinic and identified patient specific risk factors. I discussed that patient was identified as high risk primarily based on:  Patient has past medical  history positive for: Past Medical History:  Diagnosis Date   Depression    mild   Family history of breast cancer 01/22/2021   Family history of prostate cancer 01/22/2021   Genital herpes    History of back injury 2003   Skin cancer    melanoma    Patient has past surgical history positive for: Past Surgical History:  Procedure Laterality Date   APPLICATION OF CRANIAL NAVIGATION N/A 01/03/2022   Procedure: APPLICATION OF CRANIAL NAVIGATION;  Surgeon: Consuella Lose, MD;  Location: [place];  Service: Neurosurgery;  Laterality: N/A;   CRANIOTOMY Left 01/03/2022   Procedure: Stereotactic Left temporal craniotomy for resection of tumor;  Surgeon: Consuella Lose, MD;  Location: [place];  Service: Neurosurgery;  Laterality: Left;   MELANOMA EXCISION WITH SENTINEL LYMPH NODE BIOPSY Right 2021   SPINAL FUSION W/ LUQUE UNIT ROD  2003    Provided general information including the following: 1.  Date of education: 01/28/2022 2.  Physician name: Dr. Hinton Rao 3.  Diagnosis: Malignant melanoma 4.  Stage: Stage IIA 5.  Palliative 6.  Chemotherapy plan including drugs and how often: Ipilimumab/ Nivolumab 7.  Start date: Pending authorization 8.  Other referrals: None at this time 9.  The patient is to call our office with any questions or concerns.  Our office number [phone], if after hours or on the weekend, call the same number and wait for the answering service.  There is always an oncologist on call 10.  Medications prescribed: Ondansetron, Prochlorperazine 11.  The patient has verbalized  understanding of the treatment plan and has no barriers to adherence or understanding.  Obtained signed consent from patient.  Discussed symptoms including 1.  Low blood counts including red blood cells, white blood cells and platelets. 2. Infection including to avoid large crowds, wash hands frequently, and stay away from people who were sick.  If fever develops of 100.4 or higher, call our office. 3.  Mucositis-given instructions on mouth rinse (baking soda and salt mixture).  Keep mouth clean.  Use soft bristle toothbrush.  If mouth sores develop, call our clinic. 4.  Nausea/vomiting-gave prescriptions for ondansetron 4 mg every 4 hours as needed for nausea, may take around the clock if persistent.  Compazine 10 mg every 6 hours, may take around the clock if persistent. 5.  Diarrhea-use over-the-counter Imodium.  Call clinic if not controlled. 6.  Constipation-use senna, 1 to 2 tablets twice a day.  If no BM in 2 to 3 days call the clinic. 7.  Loss of appetite-try to eat small meals every 2-3 hours.  Call clinic if not eating. 8.  Taste changes-zinc 500 mg daily.  If becomes severe call clinic. 9.  Alcoholic beverages. 10.  Drink 2 to 3 quarts of water per day. 11.  Peripheral neuropathy-patient to call if numbness or tingling in hands or feet is persistent  Neulasta-will be given 24 to 48 hours after chemotherapy.  Gave information sheet on bone and joint pain.  Use Claritin or Pepcid.  May use ibuprofen or Aleve.  Call if symptoms persist or are unbearable.  Gave information on the supportive care team and how to contact them regarding services.  Discussed advanced directives.  The patient does not have their advanced directives but will look at the copy provided in their notebook and will call with any questions. Spiritual Nutrition Financial Social worker Advanced directives  Answered questions to patient satisfaction.  Patient is to call with any further questions or  concerns.   The medication prescribed to the patient will be printed out from [facility] references This will give the following information: Name of your medication Approved uses Dose and schedule Storage and handling Handling body fluids and waste Drug and food interactions Possible side effects and management Pregnancy, sexual activity, and contraception Obtaining medication  We discussed that social determinants of health may have significant impacts on health and outcomes for cancer patients.  Today we discussed specific social determinants of performance status, alcohol use, depression, financial needs, food insecurity, housing, interpersonal violence, social connections, stress, tobacco use, and transportation.    After lengthy discussion the following were identified as areas of need:   Outpatient services: We discussed options including home based and outpatient services, DME and care program. We discusssed that patients who participate in regular physical activity report fewer negative impacts of cancer and treatments and report less fatigue.   Financial Concerns: We discussed that living with cancer can create tremendous financial burden.  We discussed options for assistance. I asked that if assistance is needed in affording medications or paying bills to please let us know so that we can provide assistance. We discussed options for food including social services and onsite food pantry.  We will also notify Mort Sawyers to see if cancer center can provide additional support.  Referral to Social work: Introduced Education officer, museum Mort Sawyers and the services she can provide such as support with utility bill, cell phone and gas vouchers.   Support groups: We discussed options for support groups at the cancer center. If interested, please notify nurse navigator to enroll. We discussed options for managing stress including healthy eating, exercise as well as participating in no charge  counseling services at the cancer center and support groups.  If these are of interest, patient can notify either myself or primary nursing team.We discussed options for management including medications and referral to quit Smart program  Transportation: We discussed options for transportation including [employer], paratransit, bus routes, link transit, taxi/uber/lyft, and cancer center van.  I have notified primary oncology team who will help assist with arranging Lucianne Lei transportation for appointments when/if needed. We also discussed options for transportation on short notice/acute visits.  Palliative care services: We have palliative care services available in the cancer center to discuss goals of care and advanced care planning.  Please let us know if you have any questions or would like to speak to our palliative nurse practitioner.  Symptom Management Clinic: We discussed our symptom management clinic which is available for acute concerns while receiving treatment such as nausea, vomiting or diarrhea.  We can be reached via telephone at [phone] or through my chart.  We are available for virtual or in person visits on the same day from 830 to 4 PM Monday through Friday. He denies needing specific assistance at this time and He will be followed by Dr. Hinton Rao clinical team.  Plan: Discussed symptom management clinic. Discussed palliative care services. Discussed resources that are available here at the cancer center. Discussed medications and new prescriptions to begin treatment such as anti-nausea or steroids.   Disposition: RTC on   Visit Diagnosis No diagnosis found.  Patient expressed understanding and was in agreement with this plan. He also understands that He can call clinic at any time with any questions, concerns, or complaints.   I provided 30 minutes of  face to face  during this encounter, and > 50% was spent counseling as documented under my assessment & plan.  Dayton Scrape,  FNP-BC

## 2022-01-29 LAB — T4: T4, Total: 8.6 ug/dL (ref 4.5–12.0)

## 2022-01-29 MED FILL — Ipilimumab Soln for IV Infusion 200 MG/40ML (5 MG/ML): INTRAVENOUS | Qty: 40 | Status: AC

## 2022-01-29 MED FILL — Nivolumab IV Soln 100 MG/10ML: INTRAVENOUS | Qty: 8 | Status: AC

## 2022-01-30 ENCOUNTER — Inpatient Hospital Stay: Payer: Medicare Other

## 2022-01-30 VITALS — BP 130/90 | HR 84 | Temp 97.7°F | Resp 18 | Ht 69.2 in | Wt 161.1 lb

## 2022-01-30 DIAGNOSIS — C4361 Malignant melanoma of right upper limb, including shoulder: Secondary | ICD-10-CM | POA: Diagnosis not present

## 2022-01-30 DIAGNOSIS — Z79899 Other long term (current) drug therapy: Secondary | ICD-10-CM | POA: Diagnosis not present

## 2022-01-30 DIAGNOSIS — C439 Malignant melanoma of skin, unspecified: Secondary | ICD-10-CM

## 2022-01-30 DIAGNOSIS — C7931 Secondary malignant neoplasm of brain: Secondary | ICD-10-CM

## 2022-01-30 DIAGNOSIS — Z87891 Personal history of nicotine dependence: Secondary | ICD-10-CM | POA: Diagnosis not present

## 2022-01-30 DIAGNOSIS — Z5112 Encounter for antineoplastic immunotherapy: Secondary | ICD-10-CM | POA: Diagnosis not present

## 2022-01-30 DIAGNOSIS — C7800 Secondary malignant neoplasm of unspecified lung: Secondary | ICD-10-CM

## 2022-01-30 DIAGNOSIS — C778 Secondary and unspecified malignant neoplasm of lymph nodes of multiple regions: Secondary | ICD-10-CM | POA: Diagnosis not present

## 2022-01-30 DIAGNOSIS — C787 Secondary malignant neoplasm of liver and intrahepatic bile duct: Secondary | ICD-10-CM

## 2022-01-30 MED ORDER — DIPHENHYDRAMINE HCL 50 MG/ML IJ SOLN
25.0000 mg | Freq: Once | INTRAMUSCULAR | Status: AC
  Administered 2022-01-30: 25 mg via INTRAVENOUS
  Filled 2022-01-30: qty 1

## 2022-01-30 MED ORDER — SODIUM CHLORIDE 0.9 % IV SOLN: Freq: Once | INTRAVENOUS | Status: AC

## 2022-01-30 MED ORDER — SODIUM CHLORIDE 0.9 % IV SOLN
1.0000 mg/kg | Freq: Once | INTRAVENOUS | Status: AC
  Administered 2022-01-30: 80 mg via INTRAVENOUS
  Filled 2022-01-30: qty 8

## 2022-01-30 MED ORDER — SODIUM CHLORIDE 0.9 % IV SOLN
3.0000 mg/kg | Freq: Once | INTRAVENOUS | Status: AC
  Administered 2022-01-30: 200 mg via INTRAVENOUS
  Filled 2022-01-30: qty 40

## 2022-01-30 MED ORDER — SODIUM CHLORIDE 0.9% FLUSH
10.0000 mL | INTRAVENOUS | Status: DC | PRN
  Administered 2022-01-30: 10 mL

## 2022-01-30 MED ORDER — HEPARIN SOD (PORK) LOCK FLUSH 100 UNIT/ML IV SOLN
500.0000 [IU] | Freq: Once | INTRAVENOUS | Status: AC | PRN
  Administered 2022-01-30: 500 [IU]

## 2022-01-30 MED ORDER — FAMOTIDINE IN NACL 20-0.9 MG/50ML-% IV SOLN
20.0000 mg | Freq: Once | INTRAVENOUS | Status: AC
  Administered 2022-01-30: 20 mg via INTRAVENOUS
  Filled 2022-01-30: qty 50

## 2022-01-30 NOTE — Progress Notes (Signed)
Ipilimumab (YERVOY) Patient Monitoring Assessment   Is the patient experiencing any of the following general symptoms?:  '[x]'$ Patient is not experiencing any of the general symptoms listed in this section.  '[]'$ Difficulty performing normal activities '[]'$ Feeling sluggish or cold all the time '[]'$ Unusual weight gain '[x]'$ Constant or unusual headaches- pt states headaches going away '[]'$ Feeling dizzy or faint '[]'$ Changes in eyesight (blurry vision, double vision, or other vision problems) '[]'$ Changes in mood or behavior (ex: decreased sex drive, irritability, or forgetfulness) '[]'$ Starting new medications (ex: steroids, other medications that lower immune response)   Gastrointestinal  Patient is having 1-2 bowel movements each week Is this different from baseline? '[]'$ Yes '[x]'$ No Are your stools watery or do they have a foul smell? '[]'$ Yes '[x]'$ No Have you seen blood in your stools? '[]'$ Yes '[x]'$ No Are your stools dark, tarry, or sticky? '[]'$ Yes '[x]'$ No Are you having pain or tenderness in your belly? '[]'$ Yes '[x]'$ No  Skin Does your skin itch? '[]'$ Yes '[x]'$ No Do you have a rash? '[]'$ Yes '[x]'$ No Has your skin blistered and/or peeled? '[]'$ Yes '[x]'$ No Do you have sores in your mouth? '[]'$ Yes '[x]'$ No  Hepatic Has your urine been dark or tea colored? '[]'$ Yes '[x]'$ No Have you noticed your skin or the whites of your eyes are turning yellow? '[]'$ Yes '[x]'$ No Are you bleeding or bruising more easily than normal? '[]'$ Yes '[x]'$ No Are you nauseous and/or vomiting? '[]'$ Yes '[x]'$ No Do you have pain on the right side of your stomach? '[]'$ Yes '[x]'$ No  Neurologic  Are you having unusual weakness of legs, arms, or face? '[]'$ Yes '[x]'$ No Are you having numbness or tingling in your hands or feet? '[]'$ Yes '[x]'$ No  Emmaline Life

## 2022-01-30 NOTE — Patient Instructions (Signed)
[facility]  Discharge Instructions: Thank you for choosing [facility] to provide your oncology and hematology care.  If you have a lab appointment with the [facility], please go directly to the [facility] and check in at the registration area.   Wear comfortable clothing and clothing appropriate for easy access to any Portacath or PICC line.   We strive to give you quality time with your provider. You may need to reschedule your appointment if you arrive late (15 or more minutes).  Arriving late affects you and other patients whose appointments are after yours.  Also, if you miss three or more appointments without notifying the office, you may be dismissed from the clinic at the provider's discretion.      For prescription refill requests, have your pharmacy contact our office and allow 72 hours for refills to be completed.    To help prevent nausea and vomiting after your treatment, we encourage you to take your nausea medication as directed.  BELOW ARE SYMPTOMS THAT SHOULD BE REPORTED IMMEDIATELY: *FEVER GREATER THAN 100.4 F (38 C) OR HIGHER *CHILLS OR SWEATING *NAUSEA AND VOMITING THAT IS NOT CONTROLLED WITH YOUR NAUSEA MEDICATION *UNUSUAL SHORTNESS OF BREATH *UNUSUAL BRUISING OR BLEEDING *URINARY PROBLEMS (pain or burning when urinating, or frequent urination) *BOWEL PROBLEMS (unusual diarrhea, constipation, pain near the anus) TENDERNESS IN MOUTH AND THROAT WITH OR WITHOUT PRESENCE OF ULCERS (sore throat, sores in mouth, or a toothache) UNUSUAL RASH, SWELLING OR PAIN  UNUSUAL VAGINAL DISCHARGE OR ITCHING   Items with * indicate a potential emergency and should be followed up as soon as possible or go to the Emergency Department if any problems should occur.  Please show the CHEMOTHERAPY ALERT CARD or IMMUNOTHERAPY ALERT CARD at check-in to the Emergency Department and triage nurse.  Should you have questions after your visit or need to  cancel or reschedule your appointment, please contact [facility]  Dept: [phone]  and follow the prompts.  Office hours are 8:00 a.m. to 4:30 p.m. Monday - Friday. Please note that voicemails left after 4:00 p.m. may not be returned until the following business day.  We are closed weekends and major holidays. You have access to a nurse at all times for urgent questions. Please call the main number to the clinic Dept: [phone] and follow the prompts.  For any non-urgent questions, you may also contact your provider using MyChart. We now offer e-Visits for anyone 9 and older to request care online for non-urgent symptoms. For details visit [email or web address].   Also download the MyChart app! Go to the app store, search "MyChart", open the app, select [facility], and log in with your MyChart username and password.  Masks are optional in the cancer centers. If you would like for your care team to wear a mask while they are taking care of you, please let them know. For doctor visits, patients may have with them one support person who is at least 52 years old. At this time, visitors are not allowed in the infusion area.  Ipilimumab injection What is this medication? IPILIMUMAB (IP i LIM ue mab) is a monoclonal antibody. It treats colorectal cancer, esophageal cancer, kidney cancer, liver cancer, lung cancer, melanoma, and mesothelioma. This medicine may be used for other purposes; ask your health care provider or pharmacist if you have questions. COMMON BRAND NAME(S): YERVOY What should I tell my care team before I take this medication? They  need to know if you have any of these conditions: autoimmune diseases like Crohn's disease, ulcerative colitis, or lupus have had or planning to have an allogeneic stem cell transplant (uses someone else's stem cells) history of organ transplant nervous system problems like myasthenia gravis or Guillain-Barre syndrome an  unusual or allergic reaction to ipilimumab, other medicines, foods, dyes, or preservatives pregnant or trying to get pregnant breast-feeding How should I use this medication? This medicine is for infusion into a vein. It is given by a health care professional in a hospital or clinic setting. A special MedGuide will be given to you before each treatment. Be sure to read this information carefully each time. Talk to your pediatrician regarding the use of this medicine in children. While this drug may be prescribed for children as young as 12 years for selected conditions, precautions do apply. Overdosage: If you think you have taken too much of this medicine contact a poison control center or emergency room at once. NOTE: This medicine is only for you. Do not share this medicine with others. What if I miss a dose? It is important not to miss your dose. Call your doctor or health care professional if you are unable to keep an appointment. What may interact with this medication? Interactions are not expected. This list may not describe all possible interactions. Give your health care provider a list of all the medicines, herbs, non-prescription drugs, or dietary supplements you use. Also tell them if you smoke, drink alcohol, or use illegal drugs. Some items may interact with your medicine. What should I watch for while using this medication? Tell your doctor or healthcare professional if your symptoms do not start to get better or if they get worse. Do not become pregnant while taking this medicine or for 3 months after stopping it. Women should inform their doctor if they wish to become pregnant or think they might be pregnant. There is a potential for serious side effects to an unborn child. Talk to your health care professional or pharmacist for more information. Do not breast-feed an infant while taking this medicine or for 3 months after the last dose. Your condition will be monitored carefully  while you are receiving this medicine. You may need blood work done while you are taking this medicine. What side effects may I notice from receiving this medication? Side effects that you should report to your doctor or health care professional as soon as possible: allergic reactions like skin rash, itching or hives, swelling of the face, lips, or tongue black, tarry stools bloody or watery diarrhea changes in vision dizziness eye pain fast, irregular heartbeat feeling anxious feeling faint or lightheaded, falls nausea, vomiting pain, tingling, numbness in the hands or feet redness, blistering, peeling or loosening of the skin, including inside the mouth signs and symptoms of liver injury like dark yellow or brown urine; general ill feeling or flu-like symptoms; light-colored stools; loss of appetite; nausea; right upper belly pain; unusually weak or tired; yellowing of the eyes or skin unusual bleeding or bruising Side effects that usually do not require medical attention (report to your doctor or health care professional if they continue or are bothersome): headache loss of appetite trouble sleeping This list may not describe all possible side effects. Call your doctor for medical advice about side effects. You may report side effects to FDA at 1-800-FDA-1088. Where should I keep my medication? This drug is given in a hospital or clinic and will not be stored  at home. NOTE: This sheet is a summary. It may not cover all possible information. If you have questions about this medicine, talk to your doctor, pharmacist, or health care provider.  2023 Elsevier/Gold Standard (2021-05-24 00:00:00) Nivolumab injection What is this medication? NIVOLUMAB (nye VOL ue mab) is a monoclonal antibody. It treats certain types of cancer. Some of the cancers treated are colon cancer, head and neck cancer, Hodgkin lymphoma, lung cancer, and melanoma. This medicine may be used for other purposes; ask  your health care provider or pharmacist if you have questions. COMMON BRAND NAME(S): Opdivo What should I tell my care team before I take this medication? They need to know if you have any of these conditions: Autoimmune diseases such as Crohn's disease, ulcerative colitis, or lupus Have had or planning to have an allogeneic stem cell transplant (uses someone else's stem cells) History of chest radiation Organ transplant Nervous system problems such as myasthenia gravis or Guillain-Barre syndrome An unusual or allergic reaction to nivolumab, other medicines, foods, dyes, or preservatives Pregnant or trying to get pregnant Breast-feeding How should I use this medication? This medication is injected into a vein. It is given in a hospital or clinic setting. A special MedGuide will be given to you before each treatment. Be sure to read this information carefully each time. Talk to your care team regarding the use of this medication in children. While it may be prescribed for children as young as 12 years for selected conditions, precautions do apply. Overdosage: If you think you have taken too much of this medicine contact a poison control center or emergency room at once. NOTE: This medicine is only for you. Do not share this medicine with others. What if I miss a dose? Keep appointments for follow-up doses. It is important not to miss your dose. Call your care team if you are unable to keep an appointment. What may interact with this medication? Interactions have not been studied. This list may not describe all possible interactions. Give your health care provider a list of all the medicines, herbs, non-prescription drugs, or dietary supplements you use. Also tell them if you smoke, drink alcohol, or use illegal drugs. Some items may interact with your medicine. What should I watch for while using this medication? Your condition will be monitored carefully while you are receiving this  medication. You may need blood work done while you are taking this medication. Do not become pregnant while taking this medication or for 5 months after stopping it. Women should inform their care team if they wish to become pregnant or think they might be pregnant. There is a potential for serious harm to an unborn child. Talk to your care team for more information. Do not breast-feed an infant while taking this medication or for 5 months after stopping it. What side effects may I notice from receiving this medication? Side effects that you should report to your care team as soon as possible: Allergic reactions--skin rash, itching, hives, swelling of the face, lips, tongue, or throat Bloody or black, tar-like stools Change in vision Chest pain Diarrhea Dry cough, shortness of breath or trouble breathing Eye pain Fast or irregular heartbeat Fever, chills High blood sugar (hyperglycemia)--increased thirst or amount of urine, unusual weakness or fatigue, blurry vision High thyroid levels (hyperthyroidism)--fast or irregular heartbeat, weight loss, excessive sweating or sensitivity to heat, tremors or shaking, anxiety, nervousness, irregular menstrual cycle or spotting Kidney injury--decrease in the amount of urine, swelling of the ankles, hands,  or feet Liver injury--right upper belly pain, loss of appetite, nausea, light-colored stool, dark yellow or brown urine, yellowing skin or eyes, unusual weakness or fatigue Low red blood cell count--unusual weakness or fatigue, dizziness, headache, trouble breathing Low thyroid levels (hypothyroidism)--unusual weakness or fatigue, increased sensitivity to cold, constipation, hair loss, dry skin, weight gain, feelings of depression Mood and behavior changes-confusion, change in sex drive or performance, irritability Muscle pain or cramps Pain, tingling, or numbness in the hands or feet, muscle weakness, trouble walking, loss of balance or coordination Red  or dark brown urine Redness, blistering, peeling, or loosening of the skin, including inside the mouth Stomach pain Unusual bruising or bleeding Side effects that usually do not require medical attention (report to your care team if they continue or are bothersome): Bone pain Constipation Loss of appetite Nausea Tiredness Vomiting This list may not describe all possible side effects. Call your doctor for medical advice about side effects. You may report side effects to FDA at 1-800-FDA-1088. Where should I keep my medication? This medication is given in a hospital or clinic and will not be stored at home. NOTE: This sheet is a summary. It may not cover all possible information. If you have questions about this medicine, talk to your doctor, pharmacist, or health care provider.  2023 Elsevier/Gold Standard (2021-05-24 00:00:00)

## 2022-01-31 ENCOUNTER — Encounter: Payer: Self-pay | Admitting: Urology

## 2022-01-31 ENCOUNTER — Ambulatory Visit
Admission: RE | Admit: 2022-01-31 | Discharge: 2022-01-31 | Disposition: A | Discharge status: Home / Self Care | Payer: Medicare Other | Source: Ambulatory Visit | Attending: Urology | Admitting: Urology

## 2022-01-31 ENCOUNTER — Telehealth: Payer: Self-pay

## 2022-01-31 DIAGNOSIS — C7931 Secondary malignant neoplasm of brain: Secondary | ICD-10-CM

## 2022-01-31 NOTE — Telephone Encounter (Signed)
Chemo follow up call

## 2022-01-31 NOTE — Progress Notes (Signed)
Telephone appointment. I verified patient's identity and began nursing interview. No issues reported at this time.  Meaningful use complete.  Reminded patient of his 10:30am-01/31/22 telephone appointment w/ [name] Bruning PA-C. I left my extension [phone] in case patient needs anything. Patient verbalized understanding.  Patient contact [phone]

## 2022-01-31 NOTE — Addendum Note (Signed)
Encounter addended by: Freeman Caldron, PA-C on: 01/31/2022 12:48 PM  Actions taken: Clinical Note Signed

## 2022-01-31 NOTE — Progress Notes (Signed)
  Radiation Oncology         [phone]) [phone] ________________________________  Name: Xavier Jones MRN: [id number]  Date: 01/02/2022  DOB: 06-Jun-1971  End of Treatment Note  Diagnosis:  51 year old male with brain metastases secondary to metastatic melanoma.   Indication for treatment:  Palliation       Radiation treatment dates:   01/02/22: SRS brain// Single fraction of 18 Gy to PTV1 (pre-op) and PTV4 with Single fraction of 20 Gy to PTV2, PTV3 and PTV5     Site/dose/beams/energy:   Narrative: The patient tolerated radiation treatment relatively well.  Plan: The patient has completed radiation treatment. The patient will return to radiation oncology clinic for routine followup in one month. I advised them to call or return sooner if they have any questions or concerns related to their recovery or treatment. ________________________________  Sheral Apley. Tammi Klippel, M.D.

## 2022-01-31 NOTE — Progress Notes (Signed)
..  Pharmacist Chemotherapy Monitoring - Initial Assessment    Anticipated start date: 01/30/22  The following has been reviewed per standard work regarding the patient's treatment regimen: The patient's diagnosis, treatment plan and drug doses, and organ/hematologic function Lab orders and baseline tests specific to treatment regimen  The treatment plan start date, drug sequencing, and pre-medications Prior authorization status  Patient's documented medication list, including drug-drug interaction screen and prescriptions for anti-emetics and supportive care specific to the treatment regimen The drug concentrations, fluid compatibility, administration routes, and timing of the medications to be used The patient's access for treatment and lifetime cumulative dose history, if applicable  The patient's medication allergies and previous infusion related reactions, if applicable   Changes made to treatment plan:  N/A  Follow up needed:  N/A   Juanetta Beets, RPH, 01/23/2022 2:20 PM

## 2022-01-31 NOTE — Progress Notes (Addendum)
Radiation Oncology         [phone]) [phone] ________________________________  Name: Xavier Jones MRN: [id number]  Date: 01/31/2022  DOB: Mar 31, 1971  Post Treatment Note  CC: Earlyne Iba, NP  Earlyne Iba, NP  Diagnosis:    51 year old male with brain metastases secondary to metastatic melanoma.  Interval Since Last Radiation:  4 weeks  01/02/22: SRS brain// Single fraction of 18 Gy to PTV1 (pre-op) and PTV4 with Single fraction of 20 Gy to PTV2, PTV3 and PTV5     Site/dose/beams/energy:    Narrative:  I spoke with the patient to conduct his routine scheduled 1 month follow up visit via telephone to spare the patient unnecessary potential exposure in the healthcare setting during the current COVID-19 pandemic.  The patient was notified in advance and gave permission to proceed with this visit format.  He tolerated treatment well without any ill side effects.  He had surgery for resection of the largest lesion in the left temporal lobe on 01/03/2022 under the care of Dr. Kathyrn Sheriff and a posttreatment MRI brain on 01/04/2022 confirmed gross total resection and stability of the other treated lesions.  He was started on combination immunotherapy with nivolumab/ipilimumab on 01/30/2022 under the care of of Dr. Hinton Rao with plans to complete 4 cycles of this therapy prior to transitioning to single agent nivolumab at cycle 5 pending his restaging scans show a good response to treatment.  His next scheduled follow-up visit with Dr. Hinton Rao is on 02/10/2022.                          On review of systems, the patient states he is doing well in general.  He specifically denies headaches, changes in visual or auditory acuity, focal weakness, nausea, vomiting, tremors or seizure activity.  He will occasionally have difficulty with word finding but feels that this is improving.  He has not been able to fully open his mouth since the time of surgery but reports that he has had issues with his jaw previously  and this feels similar.  He feels like he is tolerating the new immunotherapy well and is currently without complaints.  He reports a healthy appetite and is maintaining his weight.  He has noticed some decreased stamina/fatigue but otherwise, is quite pleased with his progress to date.  ALLERGIES:  is allergic to testosterone and sulfa antibiotics.  Meds: Current Outpatient Medications  Medication Sig Dispense Refill   acyclovir (ZOVIRAX) 200 MG capsule Take by mouth.     amitriptyline (ELAVIL) 25 MG tablet Take 50 mg by mouth at bedtime.     ergocalciferol (VITAMIN D2) 1.25 MG (50000 UT) capsule Take 50,000 Units by mouth every Wednesday.     HYDROcodone-acetaminophen (NORCO) 10-325 MG tablet Take 1 tablet by mouth 4 (four) times daily as needed.     ondansetron (ZOFRAN) 4 MG tablet Take 1 tablet (4 mg total) by mouth every 4 (four) hours as needed for nausea. 90 tablet 3   prochlorperazine (COMPAZINE) 10 MG tablet Take 1 tablet (10 mg total) by mouth every 6 (six) hours as needed for nausea or vomiting. 90 tablet 3   No current facility-administered medications for this encounter.    Physical Findings:  vitals were not taken for this visit.  Pain Assessment Pain Score: 0-No pain/10 Unable to assess due to telephone follow-up visit format.  Lab Findings: Lab Results  Component Value Date   WBC 8.7 01/14/2022  HGB 14.8 01/14/2022   HCT 44 01/14/2022   MCV 97 (A) 07/31/2021   PLT 361 01/14/2022     Radiographic Findings: MR BRAIN W WO CONTRAST  Result Date: 01/04/2022 CLINICAL DATA:  51 year old male with melanoma, brain metastases. Postoperative day 1 stereotactic left temporal craniotomy and resection of tumor. EXAM: MRI HEAD WITHOUT AND WITH CONTRAST TECHNIQUE: Multiplanar, multiecho pulse sequences of the brain and surrounding structures were obtained without and with intravenous contrast. CONTRAST:  7.40m GADAVIST GADOBUTROL 1 MMOL/ML IV SOLN COMPARISON:  Preoperative brain  MRI 12/26/2021 and earlier. FINDINGS: Brain: New left side craniotomy. Smooth new widespread left hemisphere pachymeningeal thickening is probably postoperative in nature (series 11, image 17). Posterior left temporal/operculum resection cavity with heterogeneous internal contents, both T1 intrinsic and isointense. Some evidence of marginal diffusion restriction (series 2, image 27). No definite enhancement following contrast. Regional vasogenic edema and mass effect are mildly reduced. T1 intrinsic left brainstem metastasis near the 7th/8th cranial nerve root entry zone is stable at 10 mm. Regional brainstem and left cerebellar peduncle edema does appear mildly increased on series 6, image 9. Right inferior frontal gyrus T1 hypointense and enhancing metastasis is stable measuring 10 mm (series 10, image 41). Stable mild regional edema. Nearby small contralateral left inferior frontal gyrus metastasis measuring 3-4 mm is stable on series 10, image 41, no edema. No new brain metastasis identified. Stable ventricle size and configuration. No other restricted diffusion. Cervicomedullary junction and pituitary are within normal limits. Normal basilar cisterns. Occasional small nonspecific cerebral white matter T2 and FLAIR hyperintense foci are stable. Vascular: Major intracranial vascular flow voids are stable. The major dural venous sinuses are enhancing and appear to be patent. Skull and upper cervical spine: Craniotomy. Negative visible cervical spine and spinal cord. Visualized bone marrow signal is within normal limits. Sinuses/Orbits: Stable and negative. Other: Mastoids are clear. Visible internal auditory structures appear normal. Postoperative changes to the left scalp. IMPRESSION: 1. New left side craniotomy and gross total resection of left temporal/operculum metastasis. Mild resection margin ischemia is possible. Stable regional edema and mass effect. Mild smooth left hemisphere meningeal thickening is  likely postoperative in nature. 2. The other three smaller brain metastases are stable, aside from mildly increased associated brainstem and left cerebellar peduncle edema. 3. No new brain metastasis identified. Electronically Signed   By: HGenevie AnnM.D.   On: 01/04/2022 10:54    Impression/Plan: 161  51year old male with brain metastases secondary to metastatic melanoma. He appears to have [facility] well [facility] and [facility] currently [facility].  We discussed the plan to obtain a posttreatment MRI brain scan in September 2023, to assess his treatment response and pending this scan is stable, we will then proceed with serial MRI brain scans every 3 months to continue to monitor closely for any evidence of disease recurrence or progression.  I will plan to follow-up with him by telephone following each scan to review results and recommendations from the multidisciplinary brain conference.  He is comfortable and in agreement with this plan.  He will also continue in routine follow-up under the care and direction of Dr. MHinton Raofor continued management of his systemic disease.  I look forward to talking with him again in September 2023, following his next MRI brain scan but he knows that he is welcome to call at anytime in the interim with any questions or concerns.    ANicholos Johns PA-C

## 2022-02-04 ENCOUNTER — Encounter: Payer: Self-pay | Admitting: Oncology

## 2022-02-04 ENCOUNTER — Inpatient Hospital Stay: Payer: Medicare Other | Attending: Hematology and Oncology

## 2022-02-04 ENCOUNTER — Other Ambulatory Visit: Payer: Self-pay

## 2022-02-04 ENCOUNTER — Inpatient Hospital Stay (INDEPENDENT_AMBULATORY_CARE_PROVIDER_SITE_OTHER): Payer: Medicare Other | Admitting: Oncology

## 2022-02-04 VITALS — BP 135/78 | HR 84 | Temp 98.8°F | Resp 18 | Ht 69.2 in | Wt 161.9 lb

## 2022-02-04 DIAGNOSIS — C7800 Secondary malignant neoplasm of unspecified lung: Secondary | ICD-10-CM | POA: Diagnosis not present

## 2022-02-04 DIAGNOSIS — C439 Malignant melanoma of skin, unspecified: Secondary | ICD-10-CM

## 2022-02-04 DIAGNOSIS — C787 Secondary malignant neoplasm of liver and intrahepatic bile duct: Secondary | ICD-10-CM | POA: Diagnosis not present

## 2022-02-04 DIAGNOSIS — Z87891 Personal history of nicotine dependence: Secondary | ICD-10-CM | POA: Diagnosis not present

## 2022-02-04 DIAGNOSIS — C778 Secondary and unspecified malignant neoplasm of lymph nodes of multiple regions: Secondary | ICD-10-CM | POA: Diagnosis not present

## 2022-02-04 DIAGNOSIS — C7931 Secondary malignant neoplasm of brain: Secondary | ICD-10-CM | POA: Diagnosis not present

## 2022-02-04 DIAGNOSIS — Z79899 Other long term (current) drug therapy: Secondary | ICD-10-CM | POA: Diagnosis not present

## 2022-02-04 DIAGNOSIS — Z5112 Encounter for antineoplastic immunotherapy: Secondary | ICD-10-CM | POA: Insufficient documentation

## 2022-02-04 DIAGNOSIS — C4361 Malignant melanoma of right upper limb, including shoulder: Secondary | ICD-10-CM | POA: Insufficient documentation

## 2022-02-04 LAB — TSH: TSH: 1.265 u[IU]/mL (ref 0.350–4.500)

## 2022-02-04 LAB — CBC AND DIFFERENTIAL
HCT: 40 — AB (ref 41–53)
Hemoglobin: 13.6 (ref 13.5–17.5)
Neutrophils Absolute: 3.74
Platelets: 379 10*3/uL (ref 150–400)
WBC: 5.2

## 2022-02-04 LAB — CBC: RBC: 4.1 (ref 3.87–5.11)

## 2022-02-04 NOTE — Progress Notes (Signed)
Patient Care Team: Earlyne Iba, NP as PCP - General (Nurse Practitioner) Derwood Kaplan, MD as Consulting Physician (Oncology) Gatha Mayer, MD as Consulting Physician (Radiation Oncology)  Clinic Day: 02/04/22  Referring physician: Earlyne Iba, NP  ASSESSMENT & PLAN:   Assessment & Plan: Melanoma of skin ([place]) Stage IIB malignant melanoma of the right posterior shoulder, Breslow's depth 3.3 mm, Clark level IV, treated with wide local excision, diagnosed in May 2021.  There was still a question as to whether this represented a metastatic lesion, rather than a primary, based on the presence of 2 nodules and no epidermal involvement.  MRI head and CT chest, abdomen and pelvis in June were negative for metastatic disease.   No adjuvant therapy was recommended.  PET scan in September was negative for metastatic disease. Chest x-ray in June 2022 was negative.   Metastasis to subcutaneous tissue  He presented with a new area of concern to the left mid back in June of this year. A superficial nodule is noted approximately 1 cm to the right of his upper thoracic spine. He cannot fully assess the area due to location, but feels like it has increased in size. There is no redness or warmth noted to the area.  He was sent for biopsy and that was performed on June 13 and confirmed metastatic malignant melanoma.  Melan-A was positive S100 was positive SOX10 was positive and PRAME positive.  We will order BRAF testing and a melanoma panel through [facility].  He has several hypermetabolic subcutaneous nodules seen.  Metastasis to liver He has a small hypermetabolic liver lesion which is new and less than 1 cm but suspicious for metastasis.  Metastasis to lymph nodes He has multiple hypermetabolic nodes of the retroperitoneal and right pelvic area as well as right external iliac and a few additional nodes.  Pulmonary metastases He has bilateral hypermetabolic pulmonary nodules which are  increased in size compared with the CT chest performed in May of this year.  The largest nodule is up to 2.3 cm in diameter.  Metastases to brain This was suspicious on the PET scan in the left frontoparietal lobe and so he had an MRI of the brain.  This confirmed a 2.6 cm heterogeneous lesion of the left superior temporal gyrus with mild enhancement and extensive surrounding edema.  He also has an 8 mm cortical lesion of the inferomedial right frontal lobe with mild edema, a 2 mm mildly enhancing cortical lesion of the inferolateral left frontal lobe and a intrinsically T1 hyperintense lesion of the left lateral pons measuring 7 mm with surrounding edema.  He has 4 lesions in total but 3 are less than 1 cm.  He was on dexamethasone but tapered off, and has received stereotactic radiation.   Multiple dysplastic nevi Multiple dysplastic nevi of the right lower back with severe atypia.  The nevus of the medial back had positive peripheral margins, so he underwent re-excision of this area as well, which only revealed dermal scar. Dysplastic compound nevus of the left mid buttock with moderate atypia.  Margins were free of neoplasm.     Family history of breast cancer Maternal family history of breast cancer in addition to his melanoma history which raises suspicion for possible BRCA mutation.  He met with the genetic counselors and [facility] RNA + hereditary cancer panel revealed variants of uncertain significance (VUS) of the APC, CDKN1C and FH genes.     We will plan to see her back in  2 weeks with CBC and CMP for repeat evaluation before his second cycle of immunotherapy 2 days after. The patient understands the plans discussed today and is in agreement with them.  He knows to contact our office if she develops concerns prior to her next appointment. I have answered his questions and reviewed this information in detail.The patient understands the plans discussed today and is in agreement with them.  He  knows to contact our office if he develops concerns prior to his next .    Derwood Kaplan, MD  [facility] AT [facility] [address] [place] [address] Dept: [phone] Dept Fax: [phone]   Orders Placed This Encounter  Procedures   CBC and differential    This external order was created through the Results Console.   CBC    This external order was created through the Results Console.      CHIEF COMPLAINT:  CC: Metastatic melanoma  Current Treatment: Corticosteroids and brain irradiation, followed by immunotherapy  INTERVAL HISTORY:  Dvonte is here today for repeat clinical assessment.  He reports of having diarrhea but denies any more issues and reports of him feeling fine. He reports of aching but denies fever, and reports of feeling cold. He reports of some itching, along with a rash but can be from the taping. He reports of still having headaches. His appetite is fair. His weight is stable. Filed Weights   02/04/22 0855  Weight: 161 lb 14.4 oz (73.4 kg)   His 1st dose was Thursday 12/31/2021. He will be having his second cycle in 2 weeks.  He denies pain. CBC and CMP are normal.  I have reviewed the past medical history, past surgical history, social history and family history with the patient and they are unchanged from previous note.  ALLERGIES:  is allergic to testosterone and sulfa antibiotics.  MEDICATIONS:  Current Outpatient Medications  Medication Sig Dispense Refill   acyclovir (ZOVIRAX) 200 MG capsule Take by mouth.     amitriptyline (ELAVIL) 25 MG tablet Take 50 mg by mouth at bedtime.     ergocalciferol (VITAMIN D2) 1.25 MG (50000 UT) capsule Take 50,000 Units by mouth every Wednesday.     hydrOXYzine (ATARAX) 25 MG tablet Take 1 tablet (25 mg total) by mouth 3 (three) times daily as needed for itching. 60 tablet 3   ondansetron (ZOFRAN) 4 MG tablet Take 1 tablet (4 mg total) by mouth every 4  (four) hours as needed for nausea. 90 tablet 3   prochlorperazine (COMPAZINE) 10 MG tablet Take 1 tablet (10 mg total) by mouth every 6 (six) hours as needed for nausea or vomiting. 90 tablet 3   No current facility-administered medications for this visit.    HISTORY OF PRESENT ILLNESS:   Oncology History  Melanoma of skin ([place])  12/01/2019 Initial Diagnosis   Melanoma of skin ([place])   12/05/2019 Cancer Staging   Staging form: Melanoma of the Skin, AJCC 8th Edition - Clinical stage from 12/05/2019: Stage IIA (cT3a, cN0, cM0) - Signed by Derwood Kaplan, MD on 05/30/2020   02/18/2021 Genetic Testing   No pathogenic variants detected in [facility] Multi-Cancer +RNA Panel.  Variants of uncertain significance detected in APC (c.681C>G (p.Asp227Glu)), CDKN1C (c.610C>G (p.Pro204Ala)), and FH (c.1151C>T (p.Ala384Val)).  The report date is February 18, 2021.    The Multi-Cancer + RNA Panel offered by [facility] includes sequencing and/or deletion/duplication analysis of the following 84 genes:  AIP*, ALK, APC*,  ATM*, AXIN2*, BAP1*, BARD1*, BLM*, BMPR1A*, BRCA1*, BRCA2*, BRIP1*, CASR, CDC73*, CDH1*, CDK4, CDKN1B*, CDKN1C*, CDKN2A, CEBPA, CHEK2*, CTNNA1*, DICER1*, DIS3L2*, EGFR, EPCAM, FH*, FLCN*, GATA2*, GPC3, GREM1, HOXB13, HRAS, KIT, MAX*, MEN1*, MET, MITF, MLH1*, MSH2*, MSH3*, MSH6*, MUTYH*, NBN*, NF1*, NF2*, NTHL1*, PALB2*, PDGFRA, PHOX2B, PMS2*, POLD1*, POLE*, POT1*, PRKAR1A*, PTCH1*, PTEN*, RAD50*, RAD51C*, RAD51D*, RB1*, RECQL4, RET, RUNX1*, SDHA*, SDHAF2*, SDHB*, SDHC*, SDHD*, SMAD4*, SMARCA4*, SMARCB1*, SMARCE1*, STK11*, SUFU*, TERC, TERT, TMEM127*, Tp53*, TSC1*, TSC2*, VHL*, WRN*, and WT1.  RNA analysis is performed for * genes.   01/30/2022 -  Chemotherapy   Patient is on Treatment Plan : MELANOMA Nivolumab + Ipilimumab (1/3) q21d / Nivolumab q28d     Malignant melanoma metastatic to brain ([place])  12/19/2021 Initial Diagnosis   Malignant melanoma metastatic to brain ([place])   01/30/2022 -   Chemotherapy   Patient is on Treatment Plan : MELANOMA Nivolumab + Ipilimumab (1/3) q21d / Nivolumab q28d     Malignant melanoma metastatic to lung ([place])  12/19/2021 Initial Diagnosis   Malignant melanoma metastatic to lung ([place])   01/30/2022 -  Chemotherapy   Patient is on Treatment Plan : MELANOMA Nivolumab + Ipilimumab (1/3) q21d / Nivolumab q28d     Malignant neoplasm metastatic to liver ([place])  12/19/2021 Initial Diagnosis   Malignant neoplasm metastatic to liver ([place])   01/30/2022 -  Chemotherapy   Patient is on Treatment Plan : MELANOMA Nivolumab + Ipilimumab (1/3) q21d / Nivolumab q28d         REVIEW OF SYSTEMS:   Constitutional: Denies fevers, chills or abnormal weight loss Eyes: Denies blurriness of vision Ears, nose, mouth, throat, and face: Denies mucositis or sore throat Respiratory: Denies cough, dyspnea or wheezes Cardiovascular: Denies palpitation, chest discomfort or lower extremity swelling Gastrointestinal:  Denies nausea, heartburn or change in bowel habits Skin: Denies abnormal skin rashes Lymphatics: Denies new lymphadenopathy or easy bruising Neurological:Denies numbness, tingling or new weaknesses Behavioral/Psych: Mood is stable, no new changes  All other systems were reviewed with the patient and are negative.   VITALS:  Blood pressure 135/78, pulse 84, temperature 98.8 F (37.1 C), temperature source Oral, resp. rate 18, height 5' 9.2" (1.758 m), weight 161 lb 14.4 oz (73.4 kg), SpO2 97 %.  Wt Readings from Last 3 Encounters:  02/20/22 164 lb (74.4 kg)  02/18/22 164 lb (74.4 kg)  02/10/22 167 lb 6.4 oz (75.9 kg)    Body mass index is 23.77 kg/m.  Performance status (ECOG): 1 - Symptomatic but completely ambulatory  PHYSICAL EXAM:   GENERAL:alert, no distress and comfortable SKIN: skin color, texture, turgor are normal, no rashes or significant lesions.  He has an additional nodule of the left flank which is smaller but firm. EYES: normal,  Conjunctiva are pink and non-injected, sclera clear OROPHARYNX:no exudate, no erythema and lips, buccal mucosa, and tongue normal  NECK: supple, thyroid normal size, non-tender, without nodularity LYMPH:  no palpable lymphadenopathy in the cervical, axillary or inguinal LUNGS: clear to auscultation and percussion with normal breathing effort HEART: regular rate & rhythm with tachycardia, and no lower extremity edema ABDOMEN:abdomen soft, non-tender and normal bowel sounds Musculoskeletal:no cyanosis of digits and no clubbing  NEURO: alert & oriented x 3 with fluent speech, no focal motor/sensory deficits Port colon right upper chest maculopapular rash surrounding superior portion. Looks like allergic rash  Large scars bilateral lower back, posteriorly. Mass in upper thoracic posterior area measuring 4 x 4 cm ,  firm   LABORATORY DATA:  I have reviewed  the data as listed    Component Value Date/Time   NA 137 02/18/2022 0000   K 4.2 02/18/2022 0000   CL 101 02/18/2022 0000   CO2 31 (A) 02/18/2022 0000   BUN 11 02/18/2022 0000   CREATININE 0.8 02/18/2022 0000   CALCIUM 9.3 02/18/2022 0000   ALBUMIN 4.1 02/18/2022 0000   AST 33 02/18/2022 0000   ALT 15 02/18/2022 0000   ALKPHOS 66 02/18/2022 0000    No results found for: "SPEP", "UPEP"  Lab Results  Component Value Date   WBC 8.4 02/18/2022   NEUTROABS 4.87 02/18/2022   HGB 13.4 (A) 02/18/2022   HCT 39 (A) 02/18/2022   MCV 96 (A) 02/18/2022   PLT 415 (A) 02/18/2022      Chemistry      Component Value Date/Time   NA 137 02/18/2022 0000   K 4.2 02/18/2022 0000   CL 101 02/18/2022 0000   CO2 31 (A) 02/18/2022 0000   BUN 11 02/18/2022 0000   CREATININE 0.8 02/18/2022 0000   GLU 89 02/18/2022 0000      Component Value Date/Time   CALCIUM 9.3 02/18/2022 0000   ALKPHOS 66 02/18/2022 0000   AST 33 02/18/2022 0000   ALT 15 02/18/2022 0000       RADIOGRAPHIC STUDIES: I have personally reviewed the radiological images  as listed and agreed with the findings in the report. No results found. EXAM : 01/04/2022 MRI BRAIN W WO CONTRAST  FINDINGS: Brain: New left side craniotomy. Smooth new widespread left hemisphere pachymeningeal thickening is probably postoperative in nature (series 11, image 17). Posterior left temporal/operculum resection cavity with heterogeneous internal contents, both T1 intrinsic and isointense. Some evidence of marginal diffusion restriction (series 2, image 27). No definite enhancement following contrast. Regional vasogenic edema and mass effect are mildly reduced.   T1 intrinsic left brainstem metastasis near the 7th/8th cranial nerve root entry zone is stable at 10 mm. Regional brainstem and left cerebellar peduncle edema does appear mildly increased on series 6, image 9.   Right inferior frontal gyrus T1 hypointense and enhancing metastasis is stable measuring 10 mm (series 10, image 41). Stable mild regional edema.   Nearby small contralateral left inferior frontal gyrus metastasis measuring 3-4 mm is stable on series 10, image 41, no edema.   No new brain metastasis identified.   Stable ventricle size and configuration. No other restricted diffusion. Cervicomedullary junction and pituitary are within normal limits. Normal basilar cisterns. Occasional small nonspecific cerebral white matter T2 and FLAIR hyperintense foci are stable.   Vascular: Major intracranial vascular flow voids are stable. The major dural venous sinuses are enhancing and appear to be patent.   Skull and upper cervical spine: Craniotomy. Negative visible cervical spine and spinal cord. Visualized bone marrow signal is within normal limits.   Sinuses/Orbits: Stable and negative.   Other: Mastoids are clear. Visible internal auditory structures appear normal. Postoperative changes to the left scalp.   IMPRESSION: 1. New left side craniotomy and gross total resection of  left temporal/operculum metastasis. Mild resection margin ischemia is possible. Stable regional edema and mass effect. Mild smooth left hemisphere meningeal thickening is likely postoperative in nature.   2. The other three smaller brain metastases are stable, aside from mildly increased associated brainstem and left cerebellar peduncle edema.   3. No new brain metastasis identified.     EXAM: 12/27/2021 MRI BRAIN W WO CONTRAST  FINDINGS: Brain: There are 4 unchanged intraparenchymal masses:   1.  Left temporal lobe, 3.0 cm series 11, image 85 2. Left frontal lobe 3 mm, image 79 3. Paramedian right frontal lobe 8 mm, image 75 4. Left middle cerebellar peduncle, 9 mm, image 41.   Edema surrounding the left temporal lesion is unchanged with regional mass effect but no midline shift. Evidence of chronic hemorrhage within the left temporal and left cerebellar peduncular lesion. No new lesions. No acute infarct. No extra-axial collection.   Vascular: Normal flow voids.   Skull and upper cervical spine: Normal marrow signal.   Sinuses/Orbits: Negative.   Other: None   IMPRESSION: Unchanged size and appearance of 4 intraparenchymal metastases. No new lesions.     I,Gabriella Ballesteros,acting as a scribe for Derwood Kaplan, MD.,have documented all relevant documentation on the behalf of Derwood Kaplan, MD,as directed by  Derwood Kaplan, MD while in the presence of Derwood Kaplan, MD.

## 2022-02-05 ENCOUNTER — Other Ambulatory Visit: Payer: Self-pay | Admitting: Oncology

## 2022-02-05 ENCOUNTER — Telehealth: Payer: Self-pay

## 2022-02-05 LAB — T4: T4, Total: 8.3 ug/dL (ref 4.5–12.0)

## 2022-02-05 NOTE — Telephone Encounter (Signed)
Opened in error

## 2022-02-06 ENCOUNTER — Ambulatory Visit: Payer: Medicare Other | Admitting: Urology

## 2022-02-07 ENCOUNTER — Ambulatory Visit: Payer: Medicare Other | Admitting: Oncology

## 2022-02-07 ENCOUNTER — Other Ambulatory Visit: Payer: Medicare Other

## 2022-02-10 ENCOUNTER — Inpatient Hospital Stay (INDEPENDENT_AMBULATORY_CARE_PROVIDER_SITE_OTHER): Payer: Medicare Other | Admitting: Hematology and Oncology

## 2022-02-10 ENCOUNTER — Encounter: Payer: Self-pay | Admitting: Oncology

## 2022-02-10 ENCOUNTER — Ambulatory Visit: Payer: Medicare Other | Admitting: Oncology

## 2022-02-10 ENCOUNTER — Other Ambulatory Visit: Payer: Medicare Other

## 2022-02-10 ENCOUNTER — Telehealth: Payer: Self-pay

## 2022-02-10 ENCOUNTER — Ambulatory Visit: Payer: Self-pay | Admitting: Radiation Oncology

## 2022-02-10 ENCOUNTER — Encounter: Payer: Self-pay | Admitting: Hematology and Oncology

## 2022-02-10 DIAGNOSIS — C439 Malignant melanoma of skin, unspecified: Secondary | ICD-10-CM

## 2022-02-10 MED ORDER — HYDROXYZINE HCL 25 MG PO TABS: 25.0000 mg | ORAL_TABLET | Freq: Three times a day (TID) | ORAL | 3 refills | Status: DC | PRN

## 2022-02-10 NOTE — Assessment & Plan Note (Signed)
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 There was still a question as to whether this represented a metastatic lesion, rather than a primary, based on the presence of 2 nodules and no epidermal involvement. MRI head and CT chest, abdomen and pelvis in June were negative for metastatic disease. No adjuvant therapy was recommended. PET scan in September was negative for metastatic disease. Chest x-ray in June2022was negative. Hepresented with a new area of concern to the left mid back in June of this year. He was sent for biopsy and that was performed on June 13 and confirmed metastatic malignant melanoma.  Melan-A was positive S100 was positive SOX10 was positive and PRAME positive.  We will order BRAF testing and a melanoma panel through [facility].  He has several hypermetabolic subcutaneous nodules seen. He was also noted to have brain metastasis. He was started on ipilimumab/ nivolumab following radiation therapy to the brain. He presents to clinic today with complaints of rash and itching to his bilateral axilla and upper extremities as well as abdomen and groin area. This is most consistent with immunotherapy side effect. I will send in atarax and provided him a sample of Strata cream. If this worsens, we may need to consider holding treatment and using steroids. He will keep his scheduled appointment.

## 2022-02-10 NOTE — Telephone Encounter (Addendum)
I spoke with pt, he is coming in @ 230p. ----- Message from Melodye Ped, NP sent at 02/10/2022 10:54 AM EDT ----- Regarding: RE: Rash, itching Contact: [phone] It could be the immunotherapy or maybe yeast. I have room this afternoon if he wants to come in. I have a cream he can use for the immunotherapy rash/ itch.  ----- Message ----- From: Dairl Ponder, RN Sent: 02/10/2022   9:08 AM EDT To: Derwood Kaplan, MD; Melodye Ped, NP Subject: Rash, itching                                  Pt called to report he has red bumps, some raised, in bilateral arm pits, groin area, thighs, and waist. He is itching a lot. He has tried benadryl with little relief. No open areas. No drainage from spots. He is asking if we need to see him?

## 2022-02-10 NOTE — Progress Notes (Signed)
Patient Care Team: Earlyne Iba, NP as PCP - General (Nurse Practitioner) Derwood Kaplan, MD as Consulting Physician (Oncology) Gatha Mayer, MD as Consulting Physician (Radiation Oncology)  Clinic Day:  02/10/2022  Referring physician: Earlyne Iba, NP  ASSESSMENT & PLAN:   Assessment & Plan: Melanoma of skin ([place]) Stage IIB malignant melanoma of the right posterior shoulder, Breslow's depth 3.3 mm, Clark level IV, treated with wide local excision, diagnosed in May 2021.  There was still a question as to whether this represented a metastatic lesion, rather than a primary, based on the presence of 2 nodules and no epidermal involvement.  MRI head and CT chest, abdomen and pelvis in June were negative for metastatic disease.   No adjuvant therapy was recommended.  PET scan in September was negative for metastatic disease. Chest x-ray in June 2022 was negative. He presented with a new area of concern to the left mid back in June of this year. He was sent for biopsy and that was performed on June 13 and confirmed metastatic malignant melanoma.  Melan-A was positive S100 was positive SOX10 was positive and PRAME positive.  We will order BRAF testing and a melanoma panel through [facility].  He has several hypermetabolic subcutaneous nodules seen. He was also noted to have brain metastasis. He was started on ipilimumab/ nivolumab following radiation therapy to the brain. He presents to clinic today with complaints of rash and itching to his bilateral axilla and upper extremities as well as abdomen and groin area. This is most consistent with immunotherapy side effect. I will send in atarax and provided him a sample of Strata cream. If this worsens, we may need to consider holding treatment and using steroids. He will keep his scheduled appointment.      The patient understands the plans discussed today and is in agreement with them.  He knows to contact our office if he develops concerns prior  to his next appointment.   Melodye Ped, NP  [place] [address] [place] [address] Dept: [phone] Dept Fax: [phone]   No orders of the defined types were placed in this encounter.     CHIEF COMPLAINT:  CC: A 51 year old male with history of metastatic melanoma here for symptom management; rash and itching  Current Treatment:  Ipilimumab/ Nivolumab  INTERVAL HISTORY:  Xavier Jones is here today for repeat clinical assessment. He denies fevers or chills. He denies pain. His appetite is good. His weight has been stable.  I have reviewed the past medical history, past surgical history, social history and family history with the patient and they are unchanged from previous note.  ALLERGIES:  is allergic to testosterone and sulfa antibiotics.  MEDICATIONS:  Current Outpatient Medications  Medication Sig Dispense Refill   hydrOXYzine (ATARAX) 25 MG tablet Take 1 tablet (25 mg total) by mouth 3 (three) times daily as needed for itching. 60 tablet 3   acyclovir (ZOVIRAX) 200 MG capsule Take by mouth.     amitriptyline (ELAVIL) 25 MG tablet Take 50 mg by mouth at bedtime.     ergocalciferol (VITAMIN D2) 1.25 MG (50000 UT) capsule Take 50,000 Units by mouth every Wednesday.     ondansetron (ZOFRAN) 4 MG tablet Take 1 tablet (4 mg total) by mouth every 4 (four) hours as needed for nausea. 90 tablet 3   prochlorperazine (COMPAZINE) 10 MG tablet Take 1 tablet (10 mg total) by mouth every 6 (  six) hours as needed for nausea or vomiting. 90 tablet 3   No current facility-administered medications for this visit.    HISTORY OF PRESENT ILLNESS:   Oncology History  Melanoma of skin ([place])  12/01/2019 Initial Diagnosis   Melanoma of skin ([place])   12/05/2019 Cancer Staging   Staging form: Melanoma of the Skin, AJCC 8th Edition - Clinical stage from 12/05/2019: Stage IIA (cT3a, cN0, cM0) - Signed by Derwood Kaplan, MD on 05/30/2020   02/18/2021 Genetic Testing   No pathogenic variants detected in [facility] Multi-Cancer +RNA Panel.  Variants of uncertain significance detected in APC (c.681C>G (p.Asp227Glu)), CDKN1C (c.610C>G (p.Pro204Ala)), and FH (c.1151C>T (p.Ala384Val)).  The report date is February 18, 2021.    The Multi-Cancer + RNA Panel offered by [facility] includes sequencing and/or deletion/duplication analysis of the following 84 genes:  AIP*, ALK, APC*, ATM*, AXIN2*, BAP1*, BARD1*, BLM*, BMPR1A*, BRCA1*, BRCA2*, BRIP1*, CASR, CDC73*, CDH1*, CDK4, CDKN1B*, CDKN1C*, CDKN2A, CEBPA, CHEK2*, CTNNA1*, DICER1*, DIS3L2*, EGFR, EPCAM, FH*, FLCN*, GATA2*, GPC3, GREM1, HOXB13, HRAS, KIT, MAX*, MEN1*, MET, MITF, MLH1*, MSH2*, MSH3*, MSH6*, MUTYH*, NBN*, NF1*, NF2*, NTHL1*, PALB2*, PDGFRA, PHOX2B, PMS2*, POLD1*, POLE*, POT1*, PRKAR1A*, PTCH1*, PTEN*, RAD50*, RAD51C*, RAD51D*, RB1*, RECQL4, RET, RUNX1*, SDHA*, SDHAF2*, SDHB*, SDHC*, SDHD*, SMAD4*, SMARCA4*, SMARCB1*, SMARCE1*, STK11*, SUFU*, TERC, TERT, TMEM127*, Tp53*, TSC1*, TSC2*, VHL*, WRN*, and WT1.  RNA analysis is performed for * genes.   01/30/2022 -  Chemotherapy   Patient is on Treatment Plan : MELANOMA Nivolumab + Ipilimumab (1/3) q21d / Nivolumab q28d     Malignant melanoma metastatic to brain ([place])  12/19/2021 Initial Diagnosis   Malignant melanoma metastatic to brain ([place])   01/30/2022 -  Chemotherapy   Patient is on Treatment Plan : MELANOMA Nivolumab + Ipilimumab (1/3) q21d / Nivolumab q28d     Malignant melanoma metastatic to lung ([place])  12/19/2021 Initial Diagnosis   Malignant melanoma metastatic to lung ([place])   01/30/2022 -  Chemotherapy   Patient is on Treatment Plan : MELANOMA Nivolumab + Ipilimumab (1/3) q21d / Nivolumab q28d     Malignant neoplasm metastatic to liver ([place])  12/19/2021 Initial Diagnosis   Malignant neoplasm metastatic to liver ([place])   01/30/2022 -  Chemotherapy   Patient is on Treatment Plan : MELANOMA Nivolumab +  Ipilimumab (1/3) q21d / Nivolumab q28d         REVIEW OF SYSTEMS:   Constitutional: Denies fevers, chills or abnormal weight loss Eyes: Denies blurriness of vision Ears, nose, mouth, throat, and face: Denies mucositis or sore throat Respiratory: Denies cough, dyspnea or wheezes Cardiovascular: Denies palpitation, chest discomfort or lower extremity swelling Gastrointestinal:  Denies nausea, heartburn or change in bowel habits Skin: Denies abnormal skin rashes Lymphatics: Denies new lymphadenopathy or easy bruising Neurological:Denies numbness, tingling or new weaknesses Behavioral/Psych: Mood is stable, no new changes  All other systems were reviewed with the patient and are negative.   VITALS:  Blood pressure (!) 141/81, pulse (!) 106, temperature 97.9 F (36.6 C), temperature source Oral, resp. rate 20, height 5' 9.2" (1.758 m), weight 167 lb 6.4 oz (75.9 kg), SpO2 98 %.  Wt Readings from Last 3 Encounters:  02/10/22 167 lb 6.4 oz (75.9 kg)  02/04/22 161 lb 14.4 oz (73.4 kg)  01/30/22 161 lb 1.3 oz (73.1 kg)    Body mass index is 24.58 kg/m.  Performance status (ECOG): 1 - Symptomatic but completely ambulatory  PHYSICAL EXAM:   GENERAL:alert, no distress and comfortable SKIN: skin color, texture, turgor are normal,  no rashes or significant lesions EYES: normal, Conjunctiva are pink and non-injected, sclera clear OROPHARYNX:no exudate, no erythema and lips, buccal mucosa, and tongue normal  NECK: supple, thyroid normal size, non-tender, without nodularity LYMPH:  no palpable lymphadenopathy in the cervical, axillary or inguinal LUNGS: clear to auscultation and percussion with normal breathing effort HEART: regular rate & rhythm and no murmurs and no lower extremity edema ABDOMEN:abdomen soft, non-tender and normal bowel sounds Musculoskeletal:no cyanosis of digits and no clubbing  NEURO: alert & oriented x 3 with fluent speech, no focal motor/sensory  deficits  LABORATORY DATA:  I have reviewed the data as listed    Component Value Date/Time   NA 136 (A) 01/14/2022 0000   K 4.6 01/14/2022 0000   CL 100 01/14/2022 0000   CO2 30 (A) 01/14/2022 0000   BUN 15 01/14/2022 0000   CREATININE 0.7 01/14/2022 0000   CALCIUM 9.6 01/14/2022 0000   ALBUMIN 4.4 01/14/2022 0000   AST 31 01/14/2022 0000   ALT 26 01/14/2022 0000   ALKPHOS 76 01/14/2022 0000    No results found for: "SPEP", "UPEP"  Lab Results  Component Value Date   WBC 5.2 02/04/2022   NEUTROABS 3.74 02/04/2022   HGB 13.6 02/04/2022   HCT 40 (A) 02/04/2022   MCV 97 (A) 07/31/2021   PLT 379 02/04/2022      Chemistry      Component Value Date/Time   NA 136 (A) 01/14/2022 0000   K 4.6 01/14/2022 0000   CL 100 01/14/2022 0000   CO2 30 (A) 01/14/2022 0000   BUN 15 01/14/2022 0000   CREATININE 0.7 01/14/2022 0000   GLU 97 01/14/2022 0000      Component Value Date/Time   CALCIUM 9.6 01/14/2022 0000   ALKPHOS 76 01/14/2022 0000   AST 31 01/14/2022 0000   ALT 26 01/14/2022 0000       RADIOGRAPHIC STUDIES: I have personally reviewed the radiological images as listed and agreed with the findings in the report. No results found.

## 2022-02-14 ENCOUNTER — Encounter: Payer: Self-pay | Admitting: Oncology

## 2022-02-14 DIAGNOSIS — C439 Malignant melanoma of skin, unspecified: Secondary | ICD-10-CM | POA: Diagnosis not present

## 2022-02-17 NOTE — Assessment & Plan Note (Addendum)
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 There was still a question as to whether this represented a metastatic lesion, rather than a primary, based on the presence of 2 nodules and no epidermal involvement. MRI head and CT chest, abdomen and pelvis in June 2021 were negative for metastatic disease. No adjuvant therapy was recommended. PET scan in September was negative for metastatic disease. Chest x-ray in June2022was negative.   Hepresented with a new area of concern to the left mid back in June of this year.  Biopsy revealed metastatic malignant melanoma.  Melan-A was positive S100 was positive SOX10 was positive and PRAME positive.  Staging PET in June revealed a focal hypermetabolic lesion of the left frontoparietal lobe, concerning for brain metastasis.  There was a small hypermetabolic subcentimeter liver lesion concerning for liver metastatic disease.   There were bilateral hypermetabolic pulmonary nodules.  There were hypermetabolic retroperitoneal and right pelvic lymph nodes, consistent with metastatic disease.  There were multiple hypermetabolic subcutaneous soft tissue nodules, also compatible with metastatic disease.  MRI brain confirmed the left frontoparietal lesion and revealed several other smaller lesions.  The solitary brain metastasis was treated with surgical resection, followed by stereotactic radiosurgery.  He was then placed on palliative ipilimumab/nivolumab with plans for 4 cycles.  Due to his first cycle on June 27.  When he was seen on August 7, he had developed a pruritic rash, felt to be due to immunotherapy.  He was given hydroxyzine, as well as Strata cream.  The rash has nearly resolved, but he has persistent pruitis, so he is using hydroxyzine 3 times a day.  He is states he was not able to use the strata cream, as it is too greasy.  He will proceed  with a second cycle of ipilimumab/nivolumab this week.  We will plan to see him back in 3 weeks for repeat clinical assessment prior to a third cycle.  Mutation testing through [facility] is pending.

## 2022-02-17 NOTE — Progress Notes (Signed)
[name]  [address] [place],  [place]  [address] [phone]  Clinic Day:  02/18/2022  Referring physician: Earlyne Iba, NP  ASSESSMENT & PLAN:   Assessment & Plan: Melanoma of skin [facility]) Stage IIB malignant melanoma of the right posterior shoulder, Breslow's depth 3.3 mm, Clark level IV, treated with wide local excision, diagnosed in May 2021.  There was still a question as to whether this represented a metastatic lesion, rather than a primary, based on the presence of 2 nodules and no epidermal involvement.  MRI head and CT chest, abdomen and pelvis in June 2021 were negative for metastatic disease.   No adjuvant therapy was recommended.  PET scan in September was negative for metastatic disease. Chest x-ray in June 2022 was negative.   He presented with a new area of concern to the left mid back in June of this year.  Biopsy revealed metastatic malignant melanoma.  Melan-A was positive S100 was positive SOX10 was positive and PRAME positive.  Staging PET in June revealed a focal hypermetabolic lesion of the left frontoparietal lobe, concerning for brain metastasis.  There was a small hypermetabolic subcentimeter liver lesion concerning for liver metastatic disease.   There were bilateral hypermetabolic pulmonary nodules.  There were hypermetabolic retroperitoneal and right pelvic lymph nodes, consistent with metastatic disease.  There were multiple hypermetabolic subcutaneous soft tissue nodules, also compatible with metastatic disease.  MRI brain confirmed the left frontoparietal lesion and revealed several other smaller lesions.  The solitary brain metastasis was treated with surgical resection, followed by stereotactic radiosurgery.  He was then placed on palliative ipilimumab/nivolumab with plans for 4 cycles.  Due to his first cycle on June 27.  When he was seen on August 7, he had developed a pruritic rash, felt to be due to immunotherapy.  He was  given hydroxyzine, as well as Strata cream.  The rash has nearly resolved, but he has persistent pruitis, so he is using hydroxyzine 3 times a day.  He is states he was not able to use the strata cream, as it is too greasy.  He will proceed with a second cycle of ipilimumab/nivolumab this week.  We will plan to see him back in 3 weeks for repeat clinical assessment prior to a third cycle.  Mutation testing through [facility] is pending.    Pruritic rash This is felt to be secondary to immunotherapy.  The rash has nearly resolved, but he has persistent pruritus.  He will continue hydroxyzine as needed.  He knows to contact us if the rash or itching worsens.  Right shoulder pain He reports mild right posterior shoulder pain, which is nonspecific.  It sounds like he has had this in the past.  Today he relates it to surgery.  There are no palpable abnormalities on examination of the right shoulder.  PET scan in June did not reveal any abnormalities in the right shoulder.  If this worsens, he knows to contact us.   We will plan to see him back in 3 weeks as above.  The patient understands the plans discussed today and is in agreement with them.  He knows to contact our office if he develops concerns prior to his next appointment.   I provided 15 minutes of face-to-face time during this encounter and > 50% was spent counseling as documented under my assessment and plan.    Xavier Jones  [address] [place] [address] Dept: [phone] Dept Fax: [phone]   Orders Placed This Encounter  Procedures   CBC and differential    This external order was created through the Results Console.   CBC    This external order was created through the Results Console.   CBC    This order was created through External Result Entry   Basic metabolic panel    This external order was created through the Results Console.    Comprehensive metabolic panel    This external order was created through the Results Console.   Hepatic function panel    This external order was created through the Results Console.      CHIEF COMPLAINT:  CC: Metastatic melanoma  Current Treatment: Palliative ipilimumab/nivolumab  HISTORY OF PRESENT ILLNESS:  Xavier Jones is a 51-year-old with recurrent metastatic melanoma.  He has a history of a stage IIB melanoma in June 2021.  He presented in May with a new subcutaneous nodule of his back.  Biopsy was positive for metastatic malignant melanoma.  CT chest revealed multiple bilateral pulmonary nodules, with at least 20 pulmonary nodules, as well as for subcutaneous nodules of the back.  PET scan revealed additional metastatic disease, including a suspicious area of the left frontoparietal lobe, a small liver lesion measuring 6 mm, but with hypermetabolic uptake.  There was hypermetabolic adenopathy of the retroperitoneum and right pelvic nodes, in addition to a node inferior to the left kidney, right external iliac node, and left paraspinal node.  Multiple hypermetabolic subcutaneous soft tissue nodules were seen with one of the left pelvis measuring 2.3 cm.  MRI of the brain revealed a 2.6 cm heterogeneous left superior temporal gyrus lesion with mild enhancement and extensive surrounding edema, as well as 7 mm lesion at the left lateral pons, a mildly enhancing 8 mm lesion of the inferomedial right frontal lobe and a 2 mm enhancing cortical lesion of the inferolateral left frontal lobe.  He underwent resection of the left temporal lesion.  The other brain metastases were with stereotactic radiosurgery.  He was started on palliative immunotherapy with ipilimumab/nivolumab and received his 1st cycle on July 27th.   BRAF testing is pending.  Oncology History  Melanoma of skin (HCC)  12/01/2019 Initial Diagnosis   Melanoma of skin (HCC)   12/05/2019 Cancer Staging   Staging form: Melanoma of  the Skin, AJCC 8th Edition - Clinical stage from 12/05/2019: Stage IIA (cT3a, cN0, cM0) - Signed by McCarty, Christine H, MD on 05/30/2020   02/18/2021 Genetic Testing   No pathogenic variants detected in [facility] Multi-Cancer +RNA Panel.  Variants of uncertain significance detected in APC (c.681C>G (p.Asp227Glu)), CDKN1C (c.610C>G (p.Pro204Ala)), and FH (c.1151C>T (p.Ala384Val)).  The report date is February 18, 2021.    The Multi-Cancer + RNA Panel offered by [facility] includes sequencing and/or deletion/duplication analysis of the following 84 genes:  AIP*, ALK, APC*, ATM*, AXIN2*, BAP1*, BARD1*, BLM*, BMPR1A*, BRCA1*, BRCA2*, BRIP1*, CASR, CDC73*, CDH1*, CDK4, CDKN1B*, CDKN1C*, CDKN2A, CEBPA, CHEK2*, CTNNA1*, DICER1*, DIS3L2*, EGFR, EPCAM, FH*, FLCN*, GATA2*, GPC3, GREM1, HOXB13, HRAS, KIT, MAX*, MEN1*, MET, MITF, MLH1*, MSH2*, MSH3*, MSH6*, MUTYH*, NBN*, NF1*, NF2*, NTHL1*, PALB2*, PDGFRA, PHOX2B, PMS2*, POLD1*, POLE*, POT1*, PRKAR1A*, PTCH1*, PTEN*, RAD50*, RAD51C*, RAD51D*, RB1*, RECQL4, RET, RUNX1*, SDHA*, SDHAF2*, SDHB*, SDHC*, SDHD*, SMAD4*, SMARCA4*, SMARCB1*, SMARCE1*, STK11*, SUFU*, TERC, TERT, TMEM127*, Tp53*, TSC1*, TSC2*, VHL*, WRN*, and WT1.  RNA analysis is performed for * genes.   01/30/2022 -  Chemotherapy   Patient   is on Treatment Plan : MELANOMA Nivolumab + Ipilimumab (1/3) q21d / Nivolumab q28d     Malignant melanoma metastatic to brain (HCC)  12/19/2021 Initial Diagnosis   Malignant melanoma metastatic to brain (HCC)   01/30/2022 -  Chemotherapy   Patient is on Treatment Plan : MELANOMA Nivolumab + Ipilimumab (1/3) q21d / Nivolumab q28d     Malignant melanoma metastatic to lung (HCC)  12/19/2021 Initial Diagnosis   Malignant melanoma metastatic to lung (HCC)   01/30/2022 -  Chemotherapy   Patient is on Treatment Plan : MELANOMA Nivolumab + Ipilimumab (1/3) q21d / Nivolumab q28d     Malignant neoplasm metastatic to liver (HCC)  12/19/2021 Initial Diagnosis   Malignant neoplasm  metastatic to liver (HCC)   01/30/2022 -  Chemotherapy   Patient is on Treatment Plan : MELANOMA Nivolumab + Ipilimumab (1/3) q21d / Nivolumab q28d         INTERVAL HISTORY:  Janai is here today for repeat clinical assessment.  He feels the lesion on his mid upper back is decreasing.  He states that the rash has nearly resolved, but he has persistent itching, for which he is using hydroxyzine 3 times a day.  He reports shoulder pain, which is not really new.  He attributes this to the recent surgery, but it sounds like he had a prior to that.  He has not tried taking acetaminophen or ibuprofen for this.  He denies other adverse effects of immunotherapy such as cough, shortness of breath or diarrhea.  He denies fevers or chills. He denies pain. His appetite is good. His weight has decreased 3 pounds over last week .  REVIEW OF SYSTEMS:  Review of Systems  Constitutional:  Negative for appetite change, chills, fatigue, fever and unexpected weight change.  HENT:   Negative for lump/mass, mouth sores and sore throat.   Respiratory:  Negative for cough and shortness of breath.   Cardiovascular:  Negative for chest pain and leg swelling.  Gastrointestinal:  Negative for abdominal pain, constipation, diarrhea, nausea and vomiting.  Genitourinary:  Negative for difficulty urinating, dysuria, frequency and hematuria.   Musculoskeletal:  Positive for arthralgias (Mild right shoulder pain). Negative for back pain and myalgias.  Skin:  Positive for itching and rash. Negative for wound.  Neurological:  Negative for dizziness, extremity weakness, headaches, light-headedness and numbness.  Hematological:  Negative for adenopathy.  Psychiatric/Behavioral:  Negative for depression and sleep disturbance. The patient is not nervous/anxious.      VITALS:  Blood pressure 116/79, pulse 94, temperature 98.3 F (36.8 C), temperature source Oral, resp. rate 18, height 5' 9.2" (1.758 m), weight 164 lb (74.4 kg),  SpO2 99 %.  Wt Readings from Last 3 Encounters:  02/18/22 164 lb (74.4 kg)  02/10/22 167 lb 6.4 oz (75.9 kg)  02/04/22 161 lb 14.4 oz (73.4 kg)    Body mass index is 24.08 kg/m.  Performance status (ECOG): 1 - Symptomatic but completely ambulatory  PHYSICAL EXAM:  Physical Exam Vitals and nursing note reviewed.  Constitutional:      General: He is not in acute distress.    Appearance: Normal appearance. He is normal weight.  HENT:     Head: Normocephalic and atraumatic.     Mouth/Throat:     Mouth: Mucous membranes are moist.     Pharynx: Oropharynx is clear. No oropharyngeal exudate or posterior oropharyngeal erythema.  Eyes:     General: No scleral icterus.    Extraocular Movements: Extraocular movements intact.       Conjunctiva/sclera: Conjunctivae normal.     Pupils: Pupils are equal, round, and reactive to light.  Cardiovascular:     Rate and Rhythm: Normal rate and regular rhythm.     Heart sounds: Normal heart sounds. No murmur heard.    No friction rub. No gallop.  Pulmonary:     Effort: Pulmonary effort is normal.     Breath sounds: Normal breath sounds. No wheezing, rhonchi or rales.  Abdominal:     General: Bowel sounds are normal. There is no distension.     Palpations: Abdomen is soft. There is no hepatomegaly, splenomegaly or mass.     Tenderness: There is no abdominal tenderness.  Musculoskeletal:        General: Normal range of motion.     Right shoulder: Normal. No swelling or tenderness. Normal range of motion.     Cervical back: Normal range of motion and neck supple. No tenderness.     Right lower leg: No edema.     Left lower leg: No edema.  Lymphadenopathy:     Cervical: No cervical adenopathy.     Upper Body:     Right upper body: No supraclavicular or axillary adenopathy.     Left upper body: No supraclavicular or axillary adenopathy.     Lower Body: No right inguinal adenopathy. No left inguinal adenopathy.  Skin:    General: Skin is warm and  dry.     Coloration: Skin is not jaundiced.     Findings: Rash (Slight macular, rash of the neck and bilateral axillae, improved from previous) present.     Comments: The subcutaneous lesion of the upper mid back still measures 2 to 3 cm, but is flatter.  Neurological:     Mental Status: He is alert and oriented to person, place, and time.     Cranial Nerves: No cranial nerve deficit.  Psychiatric:        Mood and Affect: Mood normal.        Behavior: Behavior normal.        Thought Content: Thought content normal.     LABS:      Latest Ref Rng & Units 02/18/2022   12:00 AM 02/04/2022   12:00 AM 01/14/2022   12:00 AM  CBC  WBC  8.4     5.2     8.7      Hemoglobin 13.5 - 17.5 13.4     13.6     14.8      Hematocrit 41 - 53 39     40     44      Platelets 150 - 400 K/uL 415     379     361         This result is from an external source.      Latest Ref Rng & Units 02/18/2022   12:00 AM 01/14/2022   12:00 AM 12/19/2021   12:00 AM  CMP  BUN 4 - _0 Creatinine 0.6 - 1.3 0.8     0.7     0.8      Sodium 137 - 147 137     136     141      Potassium 3.5 - 5.1 mEq/L 4.2     4.6     3.9      Chloride 99 - 108 101     100  105      CO2 13 - _0 Calcium 8.7 - 10.7 9.3     9.6     9.1      Alkaline Phos 25 - 125 66     76     58      AST 14 - 40 33     31     24      ALT 10 - 40 U/L _1 This result is from an external source.     No results found for: "CEA1", "CEA" / No results found for: "CEA1", "CEA" No results found for: "PSA1" No results found for: "LXB262" No results found for: "CAN125"  No results found for: "TOTALPROTELP", "ALBUMINELP", "A1GS", "A2GS", "BETS", "BETA2SER", "GAMS", "MSPIKE", "SPEI" No results found for: "TIBC", "FERRITIN", "IRONPCTSAT" Lab Results  Component Value Date   LDH 145 12/19/2021    STUDIES:  No results found.    HISTORY:   Past Medical History:  Diagnosis Date   Depression     mild   Family history of breast cancer 01/22/2021   Family history of prostate cancer 01/22/2021   Genital herpes    History of back injury 2003   Pruritic rash 02/18/2022   Skin cancer    melanoma    Past Surgical History:  Procedure Laterality Date   APPLICATION OF CRANIAL NAVIGATION N/A 01/03/2022   Procedure: APPLICATION OF CRANIAL NAVIGATION;  Surgeon: Consuella Lose, MD;  Location: [facility];  Service: Neurosurgery;  Laterality: N/A;   CRANIOTOMY Left 01/03/2022   Procedure: Stereotactic Left temporal craniotomy for resection of tumor;  Surgeon: Consuella Lose, MD;  Location: [facility];  Service: Neurosurgery;  Laterality: Left;   MELANOMA EXCISION WITH SENTINEL LYMPH NODE BIOPSY Right 2021   SPINAL FUSION W/ LUQUE UNIT ROD  2003    Family History  Problem Relation Age of Onset   Prostate cancer Father        dx 44s   Breast cancer Maternal Aunt        dx after 46; bilateral mastectomy   Stomach cancer Maternal Grandfather        dx 69s   Breast cancer Cousin 70       maternal male cousin; mets    Social History:  reports that he quit smoking about 25 years ago. His smoking use included cigarettes. He has never used smokeless tobacco. He reports current alcohol use of about 14.0 standard drinks of alcohol per week. He reports that he does not currently use drugs.The patient is alone today.  Allergies:  Allergies  Allergen Reactions   Testosterone Itching    Gel    Sulfa Antibiotics Rash    Current Medications: Current Outpatient Medications  Medication Sig Dispense Refill   acyclovir (ZOVIRAX) 200 MG capsule Take by mouth.     amitriptyline (ELAVIL) 25 MG tablet Take 50 mg by mouth at bedtime.     ergocalciferol (VITAMIN D2) 1.25 MG (50000 UT) capsule Take 50,000 Units by mouth every Wednesday.     hydrOXYzine (ATARAX) 25 MG tablet Take 1 tablet (25 mg total) by mouth 3 (three) times daily as needed for itching. 60 tablet 3   ondansetron (ZOFRAN) 4 MG tablet  Take 1 tablet (4 mg total) by mouth every 4 (four) hours as needed for  nausea. 90 tablet 3   prochlorperazine (COMPAZINE) 10 MG tablet Take 1 tablet (10 mg total) by mouth every 6 (six) hours as needed for nausea or vomiting. 90 tablet 3   No current facility-administered medications for this visit.         

## 2022-02-18 ENCOUNTER — Inpatient Hospital Stay: Payer: Medicare Other

## 2022-02-18 ENCOUNTER — Encounter: Payer: Self-pay | Admitting: Hematology and Oncology

## 2022-02-18 ENCOUNTER — Inpatient Hospital Stay (INDEPENDENT_AMBULATORY_CARE_PROVIDER_SITE_OTHER): Payer: Medicare Other | Admitting: Hematology and Oncology

## 2022-02-18 DIAGNOSIS — C439 Malignant melanoma of skin, unspecified: Secondary | ICD-10-CM | POA: Diagnosis not present

## 2022-02-18 DIAGNOSIS — M25511 Pain in right shoulder: Secondary | ICD-10-CM

## 2022-02-18 DIAGNOSIS — C4361 Malignant melanoma of right upper limb, including shoulder: Secondary | ICD-10-CM | POA: Diagnosis not present

## 2022-02-18 DIAGNOSIS — C7931 Secondary malignant neoplasm of brain: Secondary | ICD-10-CM | POA: Diagnosis not present

## 2022-02-18 DIAGNOSIS — C778 Secondary and unspecified malignant neoplasm of lymph nodes of multiple regions: Secondary | ICD-10-CM | POA: Diagnosis not present

## 2022-02-18 DIAGNOSIS — C787 Secondary malignant neoplasm of liver and intrahepatic bile duct: Secondary | ICD-10-CM

## 2022-02-18 DIAGNOSIS — L282 Other prurigo: Secondary | ICD-10-CM | POA: Diagnosis not present

## 2022-02-18 DIAGNOSIS — C7800 Secondary malignant neoplasm of unspecified lung: Secondary | ICD-10-CM | POA: Diagnosis not present

## 2022-02-18 DIAGNOSIS — Z5112 Encounter for antineoplastic immunotherapy: Secondary | ICD-10-CM | POA: Diagnosis not present

## 2022-02-18 HISTORY — DX: Pain in right shoulder: M25.511

## 2022-02-18 HISTORY — DX: Other prurigo: L28.2

## 2022-02-18 LAB — BASIC METABOLIC PANEL
BUN: 11 (ref 4–21)
CO2: 31 — AB (ref 13–22)
Chloride: 101 (ref 99–108)
Creatinine: 0.8 (ref 0.6–1.3)
Glucose: 89
Potassium: 4.2 mEq/L (ref 3.5–5.1)
Sodium: 137 (ref 137–147)

## 2022-02-18 LAB — COMPREHENSIVE METABOLIC PANEL
Albumin: 4.1 (ref 3.5–5.0)
Calcium: 9.3 (ref 8.7–10.7)

## 2022-02-18 LAB — TSH: TSH: 0.929 u[IU]/mL (ref 0.350–4.500)

## 2022-02-18 LAB — CBC AND DIFFERENTIAL
HCT: 39 — AB (ref 41–53)
Hemoglobin: 13.4 — AB (ref 13.5–17.5)
Neutrophils Absolute: 4.87
Platelets: 415 10*3/uL — AB (ref 150–400)
WBC: 8.4

## 2022-02-18 LAB — HEPATIC FUNCTION PANEL
ALT: 15 U/L (ref 10–40)
AST: 33 (ref 14–40)
Alkaline Phosphatase: 66 (ref 25–125)
Bilirubin, Total: 0.6

## 2022-02-18 LAB — CBC
MCV: 96 — AB (ref 80–94)
RBC: 4.09 (ref 3.87–5.11)

## 2022-02-18 NOTE — Assessment & Plan Note (Signed)
This is felt to be secondary to immunotherapy.  The rash has nearly resolved, but he has persistent pruritus.  He will continue hydroxyzine as needed.  He knows to contact us if the rash or itching worsens.

## 2022-02-18 NOTE — Assessment & Plan Note (Signed)
He reports mild right posterior shoulder pain, which is nonspecific.  It sounds like he has had this in the past.  Today he relates it to surgery.  There are no palpable abnormalities on examination of the right shoulder.  PET scan in June did not reveal any abnormalities in the right shoulder.  If this worsens, he knows to contact us.

## 2022-02-19 ENCOUNTER — Encounter: Payer: Self-pay | Admitting: Oncology

## 2022-02-19 ENCOUNTER — Other Ambulatory Visit: Payer: Self-pay

## 2022-02-19 LAB — T4: T4, Total: 7.8 ug/dL (ref 4.5–12.0)

## 2022-02-19 MED FILL — Nivolumab IV Soln 100 MG/10ML: INTRAVENOUS | Qty: 8 | Status: AC

## 2022-02-19 MED FILL — Ipilimumab Soln for IV Infusion 50 MG/10ML (5 MG/ML): INTRAVENOUS | Qty: 40 | Status: AC

## 2022-02-20 ENCOUNTER — Inpatient Hospital Stay: Payer: Medicare Other

## 2022-02-20 VITALS — BP 141/78 | HR 88 | Temp 98.2°F | Resp 18 | Wt 164.0 lb

## 2022-02-20 DIAGNOSIS — C7931 Secondary malignant neoplasm of brain: Secondary | ICD-10-CM | POA: Diagnosis not present

## 2022-02-20 DIAGNOSIS — C787 Secondary malignant neoplasm of liver and intrahepatic bile duct: Secondary | ICD-10-CM

## 2022-02-20 DIAGNOSIS — Z5112 Encounter for antineoplastic immunotherapy: Secondary | ICD-10-CM | POA: Diagnosis not present

## 2022-02-20 DIAGNOSIS — C7800 Secondary malignant neoplasm of unspecified lung: Secondary | ICD-10-CM | POA: Diagnosis not present

## 2022-02-20 DIAGNOSIS — C4361 Malignant melanoma of right upper limb, including shoulder: Secondary | ICD-10-CM | POA: Diagnosis not present

## 2022-02-20 DIAGNOSIS — C778 Secondary and unspecified malignant neoplasm of lymph nodes of multiple regions: Secondary | ICD-10-CM | POA: Diagnosis not present

## 2022-02-20 DIAGNOSIS — C439 Malignant melanoma of skin, unspecified: Secondary | ICD-10-CM

## 2022-02-20 MED ORDER — DIPHENHYDRAMINE HCL 50 MG/ML IJ SOLN
25.0000 mg | Freq: Once | INTRAMUSCULAR | Status: AC
  Administered 2022-02-20: 25 mg via INTRAVENOUS
  Filled 2022-02-20: qty 1

## 2022-02-20 MED ORDER — SODIUM CHLORIDE 0.9 % IV SOLN
3.0000 mg/kg | Freq: Once | INTRAVENOUS | Status: AC
  Administered 2022-02-20: 200 mg via INTRAVENOUS
  Filled 2022-02-20: qty 40

## 2022-02-20 MED ORDER — FAMOTIDINE IN NACL 20-0.9 MG/50ML-% IV SOLN
20.0000 mg | Freq: Once | INTRAVENOUS | Status: AC
  Administered 2022-02-20: 20 mg via INTRAVENOUS
  Filled 2022-02-20: qty 50

## 2022-02-20 MED ORDER — SODIUM CHLORIDE 0.9 % IV SOLN
1.0000 mg/kg | Freq: Once | INTRAVENOUS | Status: AC
  Administered 2022-02-20: 80 mg via INTRAVENOUS
  Filled 2022-02-20: qty 8

## 2022-02-20 MED ORDER — SODIUM CHLORIDE 0.9 % IV SOLN: Freq: Once | INTRAVENOUS | Status: AC

## 2022-02-20 NOTE — Patient Instructions (Signed)
[facility]  Discharge Instructions: Thank you for choosing [facility] to provide your oncology and hematology care.  If you have a lab appointment with the [facility], please go directly to the [facility] and check in at the registration area.   Wear comfortable clothing and clothing appropriate for easy access to any Portacath or PICC line.   We strive to give you quality time with your provider. You may need to reschedule your appointment if you arrive late (15 or more minutes).  Arriving late affects you and other patients whose appointments are after yours.  Also, if you miss three or more appointments without notifying the office, you may be dismissed from the clinic at the provider's discretion.      For prescription refill requests, have your pharmacy contact our office and allow 72 hours for refills to be completed.    To help prevent nausea and vomiting after your treatment, we encourage you to take your nausea medication as directed.  BELOW ARE SYMPTOMS THAT SHOULD BE REPORTED IMMEDIATELY: *FEVER GREATER THAN 100.4 F (38 C) OR HIGHER *CHILLS OR SWEATING *NAUSEA AND VOMITING THAT IS NOT CONTROLLED WITH YOUR NAUSEA MEDICATION *UNUSUAL SHORTNESS OF BREATH *UNUSUAL BRUISING OR BLEEDING *URINARY PROBLEMS (pain or burning when urinating, or frequent urination) *BOWEL PROBLEMS (unusual diarrhea, constipation, pain near the anus) TENDERNESS IN MOUTH AND THROAT WITH OR WITHOUT PRESENCE OF ULCERS (sore throat, sores in mouth, or a toothache) UNUSUAL RASH, SWELLING OR PAIN  UNUSUAL VAGINAL DISCHARGE OR ITCHING   Items with * indicate a potential emergency and should be followed up as soon as possible or go to the Emergency Department if any problems should occur.  Please show the CHEMOTHERAPY ALERT CARD or IMMUNOTHERAPY ALERT CARD at check-in to the Emergency Department and triage nurse.  Should you have questions after your visit or need to  cancel or reschedule your appointment, please contact [facility]  Dept: [phone]  and follow the prompts.  Office hours are 8:00 a.m. to 4:30 p.m. Monday - Friday. Please note that voicemails left after 4:00 p.m. may not be returned until the following business day.  We are closed weekends and major holidays. You have access to a nurse at all times for urgent questions. Please call the main number to the clinic Dept: [phone] and follow the prompts.  For any non-urgent questions, you may also contact your provider using MyChart. We now offer e-Visits for anyone 60 and older to request care online for non-urgent symptoms. For details visit [email or web address].   Also download the MyChart app! Go to the app store, search "MyChart", open the app, select [facility], and log in with your MyChart username and password.  Masks are optional in the cancer centers. If you would like for your care team to wear a mask while they are taking care of you, please let them know. For doctor visits, patients may have with them one support person who is at least 52 years old. At this time, visitors are not allowed in the infusion area.  Ipilimumab injection What is this medication? IPILIMUMAB (IP i LIM ue mab) is a monoclonal antibody. It treats colorectal cancer, esophageal cancer, kidney cancer, liver cancer, lung cancer, melanoma, and mesothelioma. This medicine may be used for other purposes; ask your health care provider or pharmacist if you have questions. COMMON BRAND NAME(S): YERVOY What should I tell my care team before I take this medication? They  need to know if you have any of these conditions: autoimmune diseases like Crohn's disease, ulcerative colitis, or lupus have had or planning to have an allogeneic stem cell transplant (uses someone else's stem cells) history of organ transplant nervous system problems like myasthenia gravis or Guillain-Barre syndrome an  unusual or allergic reaction to ipilimumab, other medicines, foods, dyes, or preservatives pregnant or trying to get pregnant breast-feeding How should I use this medication? This medicine is for infusion into a vein. It is given by a health care professional in a hospital or clinic setting. A special MedGuide will be given to you before each treatment. Be sure to read this information carefully each time. Talk to your pediatrician regarding the use of this medicine in children. While this drug may be prescribed for children as young as 12 years for selected conditions, precautions do apply. Overdosage: If you think you have taken too much of this medicine contact a poison control center or emergency room at once. NOTE: This medicine is only for you. Do not share this medicine with others. What if I miss a dose? It is important not to miss your dose. Call your doctor or health care professional if you are unable to keep an appointment. What may interact with this medication? Interactions are not expected. This list may not describe all possible interactions. Give your health care provider a list of all the medicines, herbs, non-prescription drugs, or dietary supplements you use. Also tell them if you smoke, drink alcohol, or use illegal drugs. Some items may interact with your medicine. What should I watch for while using this medication? Tell your doctor or healthcare professional if your symptoms do not start to get better or if they get worse. Do not become pregnant while taking this medicine or for 3 months after stopping it. Women should inform their doctor if they wish to become pregnant or think they might be pregnant. There is a potential for serious side effects to an unborn child. Talk to your health care professional or pharmacist for more information. Do not breast-feed an infant while taking this medicine or for 3 months after the last dose. Your condition will be monitored carefully  while you are receiving this medicine. You may need blood work done while you are taking this medicine. What side effects may I notice from receiving this medication? Side effects that you should report to your doctor or health care professional as soon as possible: allergic reactions like skin rash, itching or hives, swelling of the face, lips, or tongue black, tarry stools bloody or watery diarrhea changes in vision dizziness eye pain fast, irregular heartbeat feeling anxious feeling faint or lightheaded, falls nausea, vomiting pain, tingling, numbness in the hands or feet redness, blistering, peeling or loosening of the skin, including inside the mouth signs and symptoms of liver injury like dark yellow or brown urine; general ill feeling or flu-like symptoms; light-colored stools; loss of appetite; nausea; right upper belly pain; unusually weak or tired; yellowing of the eyes or skin unusual bleeding or bruising Side effects that usually do not require medical attention (report to your doctor or health care professional if they continue or are bothersome): headache loss of appetite trouble sleeping This list may not describe all possible side effects. Call your doctor for medical advice about side effects. You may report side effects to FDA at 1-800-FDA-1088. Where should I keep my medication? This drug is given in a hospital or clinic and will not be stored  at home. NOTE: This sheet is a summary. It may not cover all possible information. If you have questions about this medicine, talk to your doctor, pharmacist, or health care provider.  2023 Elsevier/Gold Standard (2021-05-24 00:00:00) Nivolumab injection What is this medication? NIVOLUMAB (nye VOL ue mab) is a monoclonal antibody. It treats certain types of cancer. Some of the cancers treated are colon cancer, head and neck cancer, Hodgkin lymphoma, lung cancer, and melanoma. This medicine may be used for other purposes; ask  your health care provider or pharmacist if you have questions. COMMON BRAND NAME(S): Opdivo What should I tell my care team before I take this medication? They need to know if you have any of these conditions: Autoimmune diseases such as Crohn's disease, ulcerative colitis, or lupus Have had or planning to have an allogeneic stem cell transplant (uses someone else's stem cells) History of chest radiation Organ transplant Nervous system problems such as myasthenia gravis or Guillain-Barre syndrome An unusual or allergic reaction to nivolumab, other medicines, foods, dyes, or preservatives Pregnant or trying to get pregnant Breast-feeding How should I use this medication? This medication is injected into a vein. It is given in a hospital or clinic setting. A special MedGuide will be given to you before each treatment. Be sure to read this information carefully each time. Talk to your care team regarding the use of this medication in children. While it may be prescribed for children as young as 12 years for selected conditions, precautions do apply. Overdosage: If you think you have taken too much of this medicine contact a poison control center or emergency room at once. NOTE: This medicine is only for you. Do not share this medicine with others. What if I miss a dose? Keep appointments for follow-up doses. It is important not to miss your dose. Call your care team if you are unable to keep an appointment. What may interact with this medication? Interactions have not been studied. This list may not describe all possible interactions. Give your health care provider a list of all the medicines, herbs, non-prescription drugs, or dietary supplements you use. Also tell them if you smoke, drink alcohol, or use illegal drugs. Some items may interact with your medicine. What should I watch for while using this medication? Your condition will be monitored carefully while you are receiving this  medication. You may need blood work done while you are taking this medication. Do not become pregnant while taking this medication or for 5 months after stopping it. Women should inform their care team if they wish to become pregnant or think they might be pregnant. There is a potential for serious harm to an unborn child. Talk to your care team for more information. Do not breast-feed an infant while taking this medication or for 5 months after stopping it. What side effects may I notice from receiving this medication? Side effects that you should report to your care team as soon as possible: Allergic reactions--skin rash, itching, hives, swelling of the face, lips, tongue, or throat Bloody or black, tar-like stools Change in vision Chest pain Diarrhea Dry cough, shortness of breath or trouble breathing Eye pain Fast or irregular heartbeat Fever, chills High blood sugar (hyperglycemia)--increased thirst or amount of urine, unusual weakness or fatigue, blurry vision High thyroid levels (hyperthyroidism)--fast or irregular heartbeat, weight loss, excessive sweating or sensitivity to heat, tremors or shaking, anxiety, nervousness, irregular menstrual cycle or spotting Kidney injury--decrease in the amount of urine, swelling of the ankles, hands,  or feet Liver injury--right upper belly pain, loss of appetite, nausea, light-colored stool, dark yellow or brown urine, yellowing skin or eyes, unusual weakness or fatigue Low red blood cell count--unusual weakness or fatigue, dizziness, headache, trouble breathing Low thyroid levels (hypothyroidism)--unusual weakness or fatigue, increased sensitivity to cold, constipation, hair loss, dry skin, weight gain, feelings of depression Mood and behavior changes-confusion, change in sex drive or performance, irritability Muscle pain or cramps Pain, tingling, or numbness in the hands or feet, muscle weakness, trouble walking, loss of balance or coordination Red  or dark brown urine Redness, blistering, peeling, or loosening of the skin, including inside the mouth Stomach pain Unusual bruising or bleeding Side effects that usually do not require medical attention (report to your care team if they continue or are bothersome): Bone pain Constipation Loss of appetite Nausea Tiredness Vomiting This list may not describe all possible side effects. Call your doctor for medical advice about side effects. You may report side effects to FDA at 1-800-FDA-1088. Where should I keep my medication? This medication is given in a hospital or clinic and will not be stored at home. NOTE: This sheet is a summary. It may not cover all possible information. If you have questions about this medicine, talk to your doctor, pharmacist, or health care provider.  2023 Elsevier/Gold Standard (2021-05-24 00:00:00)

## 2022-02-20 NOTE — Progress Notes (Signed)
Ipilimumab (YERVOY) Patient Monitoring Assessment   Is the patient experiencing any of the following general symptoms?:  +Patient is not experiencing any of the general symptoms listed in this section.  '[x]'$ Difficulty performing normal activities '[]'$ Feeling sluggish or cold all the time '[]'$ Unusual weight gain '[]'$ Constant or unusual headaches '[]'$ Feeling dizzy or faint '[]'$ Changes in eyesight (blurry vision, double vision, or other vision problems) '[]'$ Changes in mood or behavior (ex: decreased sex drive, irritability, or forgetfulness) '[]'$ Starting new medications (ex: steroids, other medications that lower immune response)   Gastrointestinal  Patient is having 1 bowel movements each day.  Is this different from baseline? '[]'$ Yes '[x]'$ No Are your stools watery or do they have a foul smell? '[]'$ Yes '[x]'$ No Have you seen blood in your stools? '[]'$ Yes '[x]'$ No Are your stools dark, tarry, or sticky? '[]'$ Yes '[x]'$ No Are you having pain or tenderness in your belly? '[]'$ Yes '[x]'$ No  Skin Does your skin itch? '[x]'$ Yes '[]'$ No Do you have a rash? '[]'$ Yes '[x]'$ No Has your skin blistered and/or peeled? '[]'$ Yes '[x]'$ No Do you have sores in your mouth? '[]'$ Yes '[x]'$ No  Hepatic Has your urine been dark or tea colored? '[]'$ Yes '[x]'$ No Have you noticed your skin or the whites of your eyes are turning yellow? '[]'$ Yes '[x]'$ No Are you bleeding or bruising more easily than normal? '[]'$ Yes '[x]'$ No Are you nauseous and/or vomiting? '[]'$ Yes '[x]'$ No Do you have pain on the right side of your stomach? '[]'$ Yes '[x]'$ No  Neurologic  Are you having unusual weakness of legs, arms, or face? '[]'$ Yes '[x]'$ No Are you having numbness or tingling in your hands or feet? '[]'$ Yes '[x]'$ No  Tildon Husky

## 2022-02-25 NOTE — Progress Notes (Signed)
  Radiation Oncology         [phone]) [phone] ________________________________  Name: Xavier Jones MRN: [id number]  Date: 01/02/2022  DOB: [birth year]  End of Treatment Note  Diagnosis:    51 y.o. man with 4 brain metastases secondary to metastatic melanoma.     Indication for treatment:  Palliation       Radiation treatment dates:   01/02/22  Site/dose/beams/energy:   He received stereotactic radiosurgery to the following targets:   These targets were treated using single isocenter with 6 Rapid Arc VMAT Beams to a prescription dose of 18 Gy to targets 1 and 4 with 20 Gy to targets 2, 3 and 5.  ExacTrac registration was performed for each couch angle.  The 100% isodose line was prescribed.  6 MV X-rays were delivered in the flattening filter free beam mode.  Narrative: The patient tolerated radiation treatment relatively well.     Plan: The patient has completed radiation treatment. The patient will return to radiation oncology clinic for routine followup in one month. I advised him to call or return sooner if he has any questions or concerns related to his recovery or treatment. ________________________________  Sheral Apley. Tammi Klippel, M.D.

## 2022-02-26 ENCOUNTER — Encounter: Payer: Self-pay | Admitting: Oncology

## 2022-03-06 ENCOUNTER — Telehealth: Payer: Self-pay | Admitting: Radiation Therapy

## 2022-03-06 ENCOUNTER — Other Ambulatory Visit: Payer: Self-pay | Admitting: Radiation Therapy

## 2022-03-06 DIAGNOSIS — C7931 Secondary malignant neoplasm of brain: Secondary | ICD-10-CM

## 2022-03-06 NOTE — Telephone Encounter (Signed)
I called to inform Xavier Jones about his upcoming brain MRI in October and telephone follow-up with [name]. His voicemail box was full, so I sent a card in the mail with this information. I included my contact information in case he has questions or conflicts with the scheduled visits.   Mont Dutton R.T.(R)(T) Radiation Special Procedures Navigator

## 2022-03-07 ENCOUNTER — Other Ambulatory Visit: Payer: Self-pay

## 2022-03-08 ENCOUNTER — Other Ambulatory Visit: Payer: Self-pay | Admitting: Pharmacist

## 2022-03-08 DIAGNOSIS — C439 Malignant melanoma of skin, unspecified: Secondary | ICD-10-CM

## 2022-03-08 DIAGNOSIS — C7931 Secondary malignant neoplasm of brain: Secondary | ICD-10-CM

## 2022-03-08 DIAGNOSIS — C787 Secondary malignant neoplasm of liver and intrahepatic bile duct: Secondary | ICD-10-CM

## 2022-03-08 DIAGNOSIS — C7800 Secondary malignant neoplasm of unspecified lung: Secondary | ICD-10-CM

## 2022-03-11 ENCOUNTER — Inpatient Hospital Stay (INDEPENDENT_AMBULATORY_CARE_PROVIDER_SITE_OTHER): Payer: Medicare Other | Admitting: Hematology and Oncology

## 2022-03-11 ENCOUNTER — Encounter: Payer: Self-pay | Admitting: Oncology

## 2022-03-11 ENCOUNTER — Encounter: Payer: Self-pay | Admitting: Hematology and Oncology

## 2022-03-11 ENCOUNTER — Inpatient Hospital Stay: Payer: Medicare Other | Attending: Hematology and Oncology

## 2022-03-11 VITALS — BP 121/87 | HR 100 | Temp 98.7°F | Resp 20 | Ht 69.2 in | Wt 158.6 lb

## 2022-03-11 DIAGNOSIS — Z79899 Other long term (current) drug therapy: Secondary | ICD-10-CM | POA: Diagnosis not present

## 2022-03-11 DIAGNOSIS — Z5112 Encounter for antineoplastic immunotherapy: Secondary | ICD-10-CM | POA: Insufficient documentation

## 2022-03-11 DIAGNOSIS — C7931 Secondary malignant neoplasm of brain: Secondary | ICD-10-CM | POA: Insufficient documentation

## 2022-03-11 DIAGNOSIS — C7800 Secondary malignant neoplasm of unspecified lung: Secondary | ICD-10-CM | POA: Diagnosis not present

## 2022-03-11 DIAGNOSIS — Z87891 Personal history of nicotine dependence: Secondary | ICD-10-CM | POA: Insufficient documentation

## 2022-03-11 DIAGNOSIS — D649 Anemia, unspecified: Secondary | ICD-10-CM | POA: Diagnosis not present

## 2022-03-11 DIAGNOSIS — C439 Malignant melanoma of skin, unspecified: Secondary | ICD-10-CM

## 2022-03-11 DIAGNOSIS — C787 Secondary malignant neoplasm of liver and intrahepatic bile duct: Secondary | ICD-10-CM

## 2022-03-11 DIAGNOSIS — C4361 Malignant melanoma of right upper limb, including shoulder: Secondary | ICD-10-CM | POA: Diagnosis not present

## 2022-03-11 LAB — HEPATIC FUNCTION PANEL
ALT: 58 U/L — AB (ref 10–40)
AST: 41 — AB (ref 14–40)
Alkaline Phosphatase: 106 (ref 25–125)
Bilirubin, Total: 0.6

## 2022-03-11 LAB — CBC AND DIFFERENTIAL
HCT: 41 (ref 41–53)
Hemoglobin: 14.2 (ref 13.5–17.5)
Neutrophils Absolute: 4.29
Platelets: 401 10*3/uL — AB (ref 150–400)
WBC: 7.8

## 2022-03-11 LAB — BASIC METABOLIC PANEL
BUN: 12 (ref 4–21)
CO2: 29 — AB (ref 13–22)
Chloride: 101 (ref 99–108)
Creatinine: 0.7 (ref 0.6–1.3)
Glucose: 99
Potassium: 4.7 mEq/L (ref 3.5–5.1)
Sodium: 138 (ref 137–147)

## 2022-03-11 LAB — TSH: TSH: 1.168 u[IU]/mL (ref 0.350–4.500)

## 2022-03-11 LAB — CBC
MCV: 93 (ref 80–94)
RBC: 4.47 (ref 3.87–5.11)

## 2022-03-11 LAB — COMPREHENSIVE METABOLIC PANEL
Albumin: 4.2 (ref 3.5–5.0)
Calcium: 9.5 (ref 8.7–10.7)

## 2022-03-11 NOTE — Progress Notes (Signed)
[facility]  [address] [place],  [place]  [address] [phone]  Clinic Day:  03/11/2022  Referring physician: Earlyne Iba, NP  ASSESSMENT & PLAN:   Assessment & Plan: Malignant melanoma metastatic to brain [facility]) Stage IIB malignant melanoma of the right posterior shoulder with recurrence in the mid back in June of this year.  Biopsy revealed metastatic malignant melanoma.  Melan-A was positive S100 was positive SOX10 was positive and PRAME positive.  Staging PET in June revealed a focal hypermetabolic lesion of the left frontoparietal lobe, concerning for brain metastasis.  MRI brain confirmed the left frontoparietal lesion and revealed several other smaller lesions.  The solitary brain metastasis was treated with surgical resection, followed by stereotactic radiosurgery.  He was then placed on palliative ipilimumab/nivolumab with plans for 4 cycles.  Due to his first cycle on June 27.  He now has left facial weakness, which simply may represent a Bell's palsy.  Bell's palsy has been reported in patients on immunotherapy.  However, due to his known brain metastasis, so I will repeat an MRI of the brain with and without contrast as soon as possible and plan to contact him with the results.  He will proceed with a 3rd cycle of ipilimumab/nivolumab this week.  We will plan to see him back in 3 weeks for repeat clinical assessment prior to a third cycle.  Melanoma of skin ([place]) Stage IIB malignant melanoma of the right posterior shoulder, Breslow's depth 3.3 mm, Clark level IV, treated with wide local excision, diagnosed in May 2021.  There was still a question as to whether this represented a metastatic lesion, rather than a primary, based on the presence of 2 nodules and no epidermal involvement.  MRI head and CT chest, abdomen and pelvis in June 2021 were negative for metastatic disease.   No adjuvant therapy was recommended.  PET scan in September was  negative for metastatic disease. Chest x-ray in June 2022 was negative.  He was found to have recurrence in mid back in June of this year.  Biopsy revealed metastatic malignant melanoma.  Melan-A was positive S100 was positive SOX10 was positive and PRAME positive.  Staging PET in June revealed a focal hypermetabolic lesion of the left frontoparietal lobe, concerning for brain metastasis.  There was a small hypermetabolic subcentimeter liver lesion concerning for liver metastatic disease.   There were bilateral hypermetabolic pulmonary nodules.  There were hypermetabolic retroperitoneal and right pelvic lymph nodes, consistent with metastatic disease.  There were multiple hypermetabolic subcutaneous soft tissue nodules, also compatible with metastatic disease.  MRI brain confirmed the left frontoparietal lesion and revealed several other smaller lesions.  The solitary brain metastasis was treated with surgical resection, followed by stereotactic radiosurgery.  He was then placed on palliative ipilimumab/nivolumab with plans for 4 cycles.  He is here prior to his 3rd cycle and states he can no longer feel the mass in his upper back.  However, he developed mild neurologic symptoms about 2 weeks ago, with difficulty closing his left eye and mild left facial weakness.  I am going to evaluate with MRI brain, but will proceed with immunotherapy this week.  Malignant melanoma metastatic to lung [facility]) Bilateral hypermetabolic pulmonary nodules on PET in June, the largest measuring 2.3 cm,  Which had  increased in size compared with the CT chest performed in May.  He is receiving palliative ipilimumab/nivolumab every 3 weeks.  He reports occasional, mild dry cough without shortness of  breath or chest pain.  He will proceed with a 3rd cycle of ipilimumab/nivolumab this week.   The patient understands the plans discussed today and is in agreement with them.  He knows to contact our office if he develops concerns prior  to his next appointment.    [name], PA-C  [facility] AT [facility] - [address] [place] [address] Dept: [phone] Dept Fax: [phone]   Orders Placed This Encounter  Procedures   MR BRAIN W WO CONTRAST    Standing Status:   Future    Standing Expiration Date:   03/12/2023    Order Specific Question:   If indicated for the ordered procedure, I authorize the administration of contrast media per Radiology protocol    Answer:   Yes    Order Specific Question:   What is the patient's sedation requirement?    Answer:   No Sedation    Order Specific Question:   Does the patient have a pacemaker or implanted devices?    Answer:   No    Order Specific Question:   Use SRS Protocol?    Answer:   Yes    Order Specific Question:   Preferred imaging location?    Answer:   [facility] (table limit - 500 lbs)   CBC and differential    This external order was created through the Results Console.   CBC    This external order was created through the Results Console.   Basic metabolic panel    This external order was created through the Results Console.   Comprehensive metabolic panel    This external order was created through the Results Console.   Hepatic function panel    This external order was created through the Results Console.   CBC    This order was created through External Result Entry      CHIEF COMPLAINT:  CC: Metastatic melanoma  Current Treatment: Ipilimumab/nivolumab every 3 weeks  HISTORY OF PRESENT ILLNESS:   Oncology History  Melanoma of skin ([place])  12/01/2019 Initial Diagnosis   Melanoma of skin ([place])   12/05/2019 Cancer Staging   Staging form: Melanoma of the Skin, AJCC 8th Edition - Clinical stage from 12/05/2019: Stage IIA (cT3a, cN0, cM0) - Signed by Derwood Kaplan, MD on 05/30/2020   02/18/2021 Genetic Testing   No pathogenic variants detected in [facility] Multi-Cancer +RNA  Panel.  Variants of uncertain significance detected in APC (c.681C>G (p.Asp227Glu)), CDKN1C (c.610C>G (p.Pro204Ala)), and FH (c.1151C>T (p.Ala384Val)).  The report date is February 18, 2021.    The Multi-Cancer + RNA Panel offered by [facility] includes sequencing and/or deletion/duplication analysis of the following 84 genes:  AIP*, ALK, APC*, ATM*, AXIN2*, BAP1*, BARD1*, BLM*, BMPR1A*, BRCA1*, BRCA2*, BRIP1*, CASR, CDC73*, CDH1*, CDK4, CDKN1B*, CDKN1C*, CDKN2A, CEBPA, CHEK2*, CTNNA1*, DICER1*, DIS3L2*, EGFR, EPCAM, FH*, FLCN*, GATA2*, GPC3, GREM1, HOXB13, HRAS, KIT, MAX*, MEN1*, MET, MITF, MLH1*, MSH2*, MSH3*, MSH6*, MUTYH*, NBN*, NF1*, NF2*, NTHL1*, PALB2*, PDGFRA, PHOX2B, PMS2*, POLD1*, POLE*, POT1*, PRKAR1A*, PTCH1*, PTEN*, RAD50*, RAD51C*, RAD51D*, RB1*, RECQL4, RET, RUNX1*, SDHA*, SDHAF2*, SDHB*, SDHC*, SDHD*, SMAD4*, SMARCA4*, SMARCB1*, SMARCE1*, STK11*, SUFU*, TERC, TERT, TMEM127*, Tp53*, TSC1*, TSC2*, VHL*, WRN*, and WT1.  RNA analysis is performed for * genes.   01/30/2022 - 02/20/2022 Chemotherapy   Patient is on Treatment Plan : MELANOMA Nivolumab + Ipilimumab (1/3) q21d / Nivolumab q28d     01/30/2022 -  Chemotherapy   Patient is on Treatment Plan :  MELANOMA Nivolumab (1) + Ipilimumab (3) q21d / Nivolumab (480) q28d     Malignant melanoma metastatic to brain ([place])  12/19/2021 Initial Diagnosis   Malignant melanoma metastatic to brain ([place])   01/30/2022 - 02/20/2022 Chemotherapy   Patient is on Treatment Plan : MELANOMA Nivolumab + Ipilimumab (1/3) q21d / Nivolumab q28d     01/30/2022 -  Chemotherapy   Patient is on Treatment Plan : MELANOMA Nivolumab (1) + Ipilimumab (3) q21d / Nivolumab (480) q28d     Malignant melanoma metastatic to lung ([place])  12/19/2021 Initial Diagnosis   Malignant melanoma metastatic to lung ([place])   01/30/2022 - 02/20/2022 Chemotherapy   Patient is on Treatment Plan : MELANOMA Nivolumab + Ipilimumab (1/3) q21d / Nivolumab q28d     01/30/2022 -  Chemotherapy   Patient  is on Treatment Plan : MELANOMA Nivolumab (1) + Ipilimumab (3) q21d / Nivolumab (480) q28d     Malignant neoplasm metastatic to liver ([place])  12/19/2021 Initial Diagnosis   Malignant neoplasm metastatic to liver ([place])   01/30/2022 - 02/20/2022 Chemotherapy   Patient is on Treatment Plan : MELANOMA Nivolumab + Ipilimumab (1/3) q21d / Nivolumab q28d     01/30/2022 -  Chemotherapy   Patient is on Treatment Plan : MELANOMA Nivolumab (1) + Ipilimumab (3) q21d / Nivolumab (480) q28d         INTERVAL HISTORY:  Xavier Jones is here today for repeat clinical assessment prior to his third cycle of ipilimumab/nivolumab.  He states he has been having dry left eye, due to his left with not blinking for about 2 weeks.  He states he is able to close his left eye with some difficulty.  He has blurry vision.  He also reports mild drooping of his left mouth for the past 2 weeks.  He feels these signs are improving.  He also reports intermittent headaches, which are fairly severe, but are relieved with Tylenol.  He denies fevers or chills. He denies pain. His appetite is decreased. His weight has decreased 6 pounds over last 3 weeks .  REVIEW OF SYSTEMS:  Review of Systems  Constitutional:  Negative for appetite change, chills, fatigue, fever and unexpected weight change.  HENT:   Negative for lump/mass, mouth sores and sore throat.   Respiratory:  Negative for cough and shortness of breath.   Cardiovascular:  Negative for chest pain and leg swelling.  Gastrointestinal:  Negative for abdominal pain, constipation, diarrhea, nausea and vomiting.  Genitourinary:  Negative for difficulty urinating, dysuria, frequency and hematuria.   Musculoskeletal:  Negative for arthralgias, back pain, gait problem and myalgias.  Skin:  Negative for itching, rash and wound.  Neurological:  Positive for headaches. Negative for dizziness, extremity weakness, gait problem, light-headedness, numbness, seizures and speech difficulty.   Hematological:  Negative for adenopathy.  Psychiatric/Behavioral:  Negative for confusion, depression and sleep disturbance. The patient is not nervous/anxious.      VITALS:  Blood pressure 121/87, pulse 100, temperature 98.7 F (37.1 C), temperature source Oral, resp. rate 20, height 5' 9.2" (1.758 m), weight 158 lb 9.6 oz (71.9 kg), SpO2 95 %.  Wt Readings from Last 3 Encounters:  03/11/22 158 lb 9.6 oz (71.9 kg)  02/20/22 164 lb (74.4 kg)  02/18/22 164 lb (74.4 kg)    Body mass index is 23.29 kg/m.  Performance status (ECOG): 1 - Symptomatic but completely ambulatory  PHYSICAL EXAM:  Physical Exam Vitals and nursing note reviewed.  Constitutional:      General:  He is not in acute distress.    Appearance: Normal appearance. He is normal weight.  HENT:     Head: Normocephalic and atraumatic.     Mouth/Throat:     Mouth: Mucous membranes are moist.     Pharynx: Oropharynx is clear. No oropharyngeal exudate or posterior oropharyngeal erythema.  Eyes:     General: No scleral icterus.    Extraocular Movements: Extraocular movements intact.     Conjunctiva/sclera: Conjunctivae normal.     Pupils: Pupils are equal, round, and reactive to light.  Cardiovascular:     Rate and Rhythm: Normal rate and regular rhythm.     Heart sounds: Normal heart sounds. No murmur heard.    No friction rub. No gallop.  Pulmonary:     Effort: Pulmonary effort is normal.     Breath sounds: Normal breath sounds. No wheezing, rhonchi or rales.  Abdominal:     General: Bowel sounds are normal. There is no distension.     Palpations: Abdomen is soft. There is no hepatomegaly, splenomegaly or mass.     Tenderness: There is no abdominal tenderness.  Musculoskeletal:        General: Normal range of motion.     Cervical back: Normal range of motion and neck supple. No tenderness.     Right lower leg: No edema.     Left lower leg: No edema.  Lymphadenopathy:     Cervical: No cervical adenopathy.      Upper Body:     Right upper body: No supraclavicular or axillary adenopathy.     Left upper body: No supraclavicular or axillary adenopathy.     Lower Body: No right inguinal adenopathy. No left inguinal adenopathy.  Skin:    General: Skin is warm and dry.     Coloration: Skin is not jaundiced.     Findings: No rash.  Neurological:     Mental Status: He is alert and oriented to person, place, and time.     Cranial Nerves: Facial asymmetry present.     Sensory: Sensation is intact.     Motor: Motor function is intact.     Coordination: Coordination is intact.     Gait: Gait is intact.     Comments: Mild drooping of the left face and eye  Psychiatric:        Mood and Affect: Mood normal.        Behavior: Behavior normal.        Thought Content: Thought content normal.     LABS:      Latest Ref Rng & Units 03/11/2022   12:00 AM 02/18/2022   12:00 AM 02/04/2022   12:00 AM  CBC  WBC  7.8     8.4     5.2      Hemoglobin 13.5 - 17.5 14.2     13.4     13.6      Hematocrit 41 - 53 41     39     40      Platelets 150 - 400 K/uL 401     415     379         This result is from an external source.      Latest Ref Rng & Units 03/11/2022   12:00 AM 02/18/2022   12:00 AM 01/14/2022   12:00 AM  CMP  BUN 4 - 21 12     11      15  Creatinine 0.6 - 1.3 0.7     0.8     0.7      Sodium 137 - 147 138     137     136      Potassium 3.5 - 5.1 mEq/L 4.7     4.2     4.6      Chloride 99 - 108 101     101     100      CO2 13 - 22 29     31     30       Calcium 8.7 - 10.7 9.5     9.3     9.6      Alkaline Phos 25 - 125 106     66     76      AST 14 - 40 41     33     31      ALT 10 - 40 U/L 58     15     26         This result is from an external source.     No results found for: "CEA1", "CEA" / No results found for: "CEA1", "CEA" No results found for: "PSA1" No results found for: "OYD741" No results found for: "CAN125"  No results found for: "TOTALPROTELP", "ALBUMINELP", "A1GS", "A2GS",  "BETS", "BETA2SER", "GAMS", "MSPIKE", "SPEI" No results found for: "TIBC", "FERRITIN", "IRONPCTSAT" Lab Results  Component Value Date   LDH 145 12/19/2021    STUDIES:  No results found.    HISTORY:   Past Medical History:  Diagnosis Date   Depression    mild   Family history of breast cancer 01/22/2021   Family history of prostate cancer 01/22/2021   Genital herpes    History of back injury 2003   Pruritic rash 02/18/2022   Skin cancer    melanoma    Past Surgical History:  Procedure Laterality Date   APPLICATION OF CRANIAL NAVIGATION N/A 01/03/2022   Procedure: APPLICATION OF CRANIAL NAVIGATION;  Surgeon: Consuella Lose, MD;  Location: [facility];  Service: Neurosurgery;  Laterality: N/A;   CRANIOTOMY Left 01/03/2022   Procedure: Stereotactic Left temporal craniotomy for resection of tumor;  Surgeon: Consuella Lose, MD;  Location: [facility];  Service: Neurosurgery;  Laterality: Left;   MELANOMA EXCISION WITH SENTINEL LYMPH NODE BIOPSY Right 2021   SPINAL FUSION W/ LUQUE UNIT ROD  2003    Family History  Problem Relation Age of Onset   Prostate cancer Father        dx 75s   Breast cancer Maternal Aunt        dx after 68; bilateral mastectomy   Stomach cancer Maternal Grandfather        dx 74s   Breast cancer Cousin 46       maternal male cousin; mets    Social History:  reports that he quit smoking about 25 years ago. His smoking use included cigarettes. He has never used smokeless tobacco. He reports current alcohol use of about 14.0 standard drinks of alcohol per week. He reports that he does not currently use drugs.The patient is alone today.  Allergies:  Allergies  Allergen Reactions   Testosterone Itching    Gel    Sulfa Antibiotics Rash    Current Medications: Current Outpatient Medications  Medication Sig Dispense Refill   acyclovir (ZOVIRAX) 200 MG capsule Take by mouth.     amitriptyline (ELAVIL) 25 MG tablet Take 50  mg by mouth at bedtime.      ergocalciferol (VITAMIN D2) 1.25 MG (50000 UT) capsule Take 50,000 Units by mouth every Wednesday.     hydrOXYzine (ATARAX) 25 MG tablet Take 1 tablet (25 mg total) by mouth 3 (three) times daily as needed for itching. 60 tablet 3   ondansetron (ZOFRAN) 4 MG tablet Take 1 tablet (4 mg total) by mouth every 4 (four) hours as needed for nausea. 90 tablet 3   prochlorperazine (COMPAZINE) 10 MG tablet Take 1 tablet (10 mg total) by mouth every 6 (six) hours as needed for nausea or vomiting. 90 tablet 3   No current facility-administered medications for this visit.

## 2022-03-11 NOTE — Assessment & Plan Note (Addendum)
Stage IIB malignant melanoma of the right posterior shoulder with recurrence in the mid back in June of this year.  Biopsy revealed metastatic malignant melanoma.  Melan-A was positive S100 was positive SOX10 was positive and PRAME positive.  Staging PET in June revealed a focal hypermetabolic lesion of the left frontoparietal lobe, concerning for brain metastasis.  MRI brain confirmed the left frontoparietal lesion and revealed several other smaller lesions.  The solitary brain metastasis was treated with surgical resection, followed by stereotactic radiosurgery.  He was then placed on palliative ipilimumab/nivolumab with plans for 4 cycles.  Due to his first cycle on June 27.  He now has left facial weakness, which simply may represent a Bell's palsy.  Bell's palsy has been reported in patients on immunotherapy.  However, due to his known brain metastasis, so I will repeat an MRI of the brain with and without contrast as soon as possible and plan to contact him with the results.  He will proceed with a 3rd cycle of ipilimumab/nivolumab this week.  We will plan to see him back in 3 weeks for repeat clinical assessment prior to a third cycle.

## 2022-03-11 NOTE — Assessment & Plan Note (Addendum)
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 There was still a question as to whether this represented a metastatic lesion, rather than a primary, based on the presence of 2 nodules and no epidermal involvement. MRI head and CT chest, abdomen and pelvis in June 2021 were negative for metastatic disease. No adjuvant therapy was recommended. PET scan in September was negative for metastatic disease. Chest x-ray in June2022was negative.  He was found to have recurrence in mid back in June of this year.  Biopsy revealed metastatic malignant melanoma.  Melan-A was positive S100 was positive SOX10 was positive and PRAME positive.  Staging PET in June revealed a focal hypermetabolic lesion of the left frontoparietal lobe, concerning for brain metastasis.  There was a small hypermetabolic subcentimeter liver lesion concerning for liver metastatic disease.   There were bilateral hypermetabolic pulmonary nodules.  There were hypermetabolic retroperitoneal and right pelvic lymph nodes, consistent with metastatic disease.  There were multiple hypermetabolic subcutaneous soft tissue nodules, also compatible with metastatic disease.  MRI brain confirmed the left frontoparietal lesion and revealed several other smaller lesions.  The solitary brain metastasis was treated with surgical resection, followed by stereotactic radiosurgery.  He was then placed on palliative ipilimumab/nivolumab with plans for 4 cycles.  He is here prior to his 3rd cycle and states he can no longer feel the mass in his upper back.  However, he developed mild neurologic symptoms about 2 weeks ago, with difficulty closing his left eye and mild left facial weakness.  I am going to evaluate with MRI brain, but will proceed with immunotherapy this week.

## 2022-03-11 NOTE — Assessment & Plan Note (Signed)
Bilateral hypermetabolic pulmonary nodules on PET in June, the largest measuring 2.3 cm,  Which had  increased in size compared with the CT chest performed in May.  He is receiving palliative ipilimumab/nivolumab every 3 weeks.  He reports occasional, mild dry cough without shortness of breath or chest pain.  He will proceed with a 3rd cycle of ipilimumab/nivolumab this week.

## 2022-03-12 LAB — T4: T4, Total: 9.3 ug/dL (ref 4.5–12.0)

## 2022-03-12 MED FILL — Ipilimumab Soln for IV Infusion 200 MG/40ML (5 MG/ML): INTRAVENOUS | Qty: 40 | Status: AC

## 2022-03-13 ENCOUNTER — Inpatient Hospital Stay: Payer: Medicare Other

## 2022-03-13 ENCOUNTER — Telehealth: Payer: Self-pay | Admitting: Dietician

## 2022-03-13 VITALS — BP 122/78 | HR 89 | Temp 98.2°F | Resp 18 | Wt 158.0 lb

## 2022-03-13 DIAGNOSIS — C7931 Secondary malignant neoplasm of brain: Secondary | ICD-10-CM

## 2022-03-13 DIAGNOSIS — C7800 Secondary malignant neoplasm of unspecified lung: Secondary | ICD-10-CM

## 2022-03-13 DIAGNOSIS — Z5112 Encounter for antineoplastic immunotherapy: Secondary | ICD-10-CM | POA: Diagnosis not present

## 2022-03-13 DIAGNOSIS — C4361 Malignant melanoma of right upper limb, including shoulder: Secondary | ICD-10-CM | POA: Diagnosis not present

## 2022-03-13 DIAGNOSIS — C787 Secondary malignant neoplasm of liver and intrahepatic bile duct: Secondary | ICD-10-CM

## 2022-03-13 DIAGNOSIS — C439 Malignant melanoma of skin, unspecified: Secondary | ICD-10-CM

## 2022-03-13 DIAGNOSIS — Z79899 Other long term (current) drug therapy: Secondary | ICD-10-CM | POA: Diagnosis not present

## 2022-03-13 MED ORDER — FAMOTIDINE IN NACL 20-0.9 MG/50ML-% IV SOLN
20.0000 mg | Freq: Once | INTRAVENOUS | Status: AC
  Administered 2022-03-13: 20 mg via INTRAVENOUS
  Filled 2022-03-13: qty 50

## 2022-03-13 MED ORDER — SODIUM CHLORIDE 0.9 % IV SOLN
2.8000 mg/kg | Freq: Once | INTRAVENOUS | Status: AC
  Administered 2022-03-13: 200 mg via INTRAVENOUS
  Filled 2022-03-13: qty 40

## 2022-03-13 MED ORDER — SODIUM CHLORIDE 0.9 % IV SOLN: Freq: Once | INTRAVENOUS | Status: AC

## 2022-03-13 MED ORDER — SODIUM CHLORIDE 0.9 % IV SOLN
1.0000 mg/kg | Freq: Once | INTRAVENOUS | Status: AC
  Administered 2022-03-13: 70 mg via INTRAVENOUS
  Filled 2022-03-13: qty 7

## 2022-03-13 MED ORDER — DIPHENHYDRAMINE HCL 50 MG/ML IJ SOLN
25.0000 mg | Freq: Once | INTRAMUSCULAR | Status: AC
  Administered 2022-03-13: 25 mg via INTRAVENOUS
  Filled 2022-03-13: qty 1

## 2022-03-13 NOTE — Progress Notes (Signed)
Ipilimumab ([facility]) Patient Monitoring Assessment   Is the patient experiencing any of the following general symptoms?:  '[x]'$ Patient is not experiencing any of the general symptoms listed in this section.  '[]'$ Difficulty performing normal activities '[]'$ Feeling sluggish or cold all the time '[]'$ Unusual weight gain '[]'$ Constant or unusual headaches '[]'$ Feeling dizzy or faint '[]'$ Changes in eyesight (blurry vision, double vision, or other vision problems) '[]'$ Changes in mood or behavior (ex: decreased sex drive, irritability, or forgetfulness) '[]'$ Starting new medications (ex: steroids, other medications that lower immune response)   Gastrointestinal  Patient is having 1-2 bowel movements each day.  Is this different from baseline? '[]'$ Yes '[x]'$ No Are your stools watery or do they have a foul smell? '[]'$ Yes '[x]'$ No Have you seen blood in your stools? '[]'$ Yes '[x]'$ No Are your stools dark, tarry, or sticky? '[]'$ Yes '[x]'$ No Are you having pain or tenderness in your belly? '[]'$ Yes '[x]'$ No  Skin Does your skin itch? '[x]'$ Yes '[]'$ No Do you have a rash? '[]'$ Yes '[x]'$ No Has your skin blistered and/or peeled? '[]'$ Yes '[x]'$ No Do you have sores in your mouth? '[]'$ Yes '[x]'$ No  Hepatic Has your urine been dark or tea colored? '[]'$ Yes '[x]'$ No Have you noticed your skin or the whites of your eyes are turning yellow? '[]'$ Yes '[x]'$ No Are you bleeding or bruising more easily than normal? '[]'$ Yes '[x]'$ No Are you nauseous and/or vomiting? '[]'$ Yes '[x]'$ No Do you have pain on the right side of your stomach? '[]'$ Yes '[x]'$ No  Neurologic  Are you having unusual weakness of legs, arms, or face? '[]'$ Yes '[x]'$ No Are you having numbness or tingling in your hands or feet? '[]'$ Yes '[x]'$ No  Tildon Husky

## 2022-03-13 NOTE — Telephone Encounter (Signed)
Patient screened on [facility]ST. First attempt to reach. Provided my cell# on voice mail to return call, also sent text message to set up a nutrition consult.  April Manson, RDN, LDN Registered Dietitian, [place] Part Time Remote (Usual office hours: Tuesday-Thursday) Cell: [phone]

## 2022-03-14 ENCOUNTER — Encounter: Payer: Self-pay | Admitting: Oncology

## 2022-03-20 ENCOUNTER — Encounter: Payer: Self-pay | Admitting: Oncology

## 2022-03-22 ENCOUNTER — Ambulatory Visit (HOSPITAL_COMMUNITY)
Admission: RE | Admit: 2022-03-22 | Discharge: 2022-03-22 | Disposition: A | Discharge status: Home / Self Care | Payer: Medicare Other | Source: Ambulatory Visit | Attending: Hematology and Oncology | Admitting: Hematology and Oncology

## 2022-03-22 DIAGNOSIS — C7931 Secondary malignant neoplasm of brain: Secondary | ICD-10-CM | POA: Insufficient documentation

## 2022-03-22 DIAGNOSIS — G9389 Other specified disorders of brain: Secondary | ICD-10-CM | POA: Diagnosis not present

## 2022-03-22 MED ORDER — GADOPICLENOL 0.5 MMOL/ML IV SOLN
7.1000 mL | Freq: Once | INTRAVENOUS | Status: AC | PRN
Start: 2022-03-22 — End: 2022-03-22
  Administered 2022-03-22: 7.1 mL via INTRAVENOUS

## 2022-03-24 ENCOUNTER — Inpatient Hospital Stay: Payer: Medicare Other

## 2022-03-24 ENCOUNTER — Other Ambulatory Visit: Payer: Self-pay | Admitting: Radiation Therapy

## 2022-03-24 ENCOUNTER — Encounter: Payer: Self-pay | Admitting: Hematology and Oncology

## 2022-03-24 ENCOUNTER — Inpatient Hospital Stay (INDEPENDENT_AMBULATORY_CARE_PROVIDER_SITE_OTHER): Payer: Medicare Other | Admitting: Hematology and Oncology

## 2022-03-24 ENCOUNTER — Telehealth: Payer: Self-pay | Admitting: Internal Medicine

## 2022-03-24 ENCOUNTER — Other Ambulatory Visit: Payer: Self-pay | Admitting: Hematology and Oncology

## 2022-03-24 VITALS — BP 137/85 | HR 106 | Temp 98.5°F | Resp 20 | Ht 69.2 in | Wt 157.9 lb

## 2022-03-24 DIAGNOSIS — R0789 Other chest pain: Secondary | ICD-10-CM | POA: Diagnosis not present

## 2022-03-24 DIAGNOSIS — C439 Malignant melanoma of skin, unspecified: Secondary | ICD-10-CM | POA: Diagnosis not present

## 2022-03-24 DIAGNOSIS — R051 Acute cough: Secondary | ICD-10-CM

## 2022-03-24 DIAGNOSIS — I2699 Other pulmonary embolism without acute cor pulmonale: Secondary | ICD-10-CM | POA: Diagnosis not present

## 2022-03-24 DIAGNOSIS — C7931 Secondary malignant neoplasm of brain: Secondary | ICD-10-CM

## 2022-03-24 MED ORDER — LEVOFLOXACIN 500 MG PO TABS: 500.0000 mg | ORAL_TABLET | Freq: Every day | ORAL | 0 refills | Status: DC

## 2022-03-24 MED ORDER — RIVAROXABAN 15 MG PO TABS: 15.0000 mg | ORAL_TABLET | Freq: Two times a day (BID) | ORAL | 0 refills | Status: DC

## 2022-03-24 MED ORDER — DEXAMETHASONE 4 MG PO TABS: 4.0000 mg | ORAL_TABLET | Freq: Two times a day (BID) | ORAL | 0 refills | Status: DC

## 2022-03-24 MED ORDER — RIVAROXABAN 20 MG PO TABS: 20.0000 mg | ORAL_TABLET | Freq: Every day | ORAL | 3 refills | Status: DC

## 2022-03-24 NOTE — Progress Notes (Signed)
[facility]  [address] [place],  [place]  [address] [phone]  Clinic Day:  03/24/2022  Referring physician: Earlyne Iba, NP   HISTORY OF PRESENT ILLNESS:  The patient is a 51 y.o. male with metastatic melanoma to the lung and brain who was added to the schedule today as he reported increased cough productive of yellow sputum and shortness of breath, as well as home pulse ox of 87%, which reported to me when I contacted him regarding his brain MRI that was done for left-sided facial weakness.  The MRI brain did show mild progression of the right frontal lobe lesion.  I have contacted Dr. Mickeal Skinner and Dr. Johny Shears office.  Dr. Tammi Klippel will return on Wednesday.  Dr. Mickeal Skinner recommended dexamethasone 4 mg twice daily for 3 days and then if the symptoms improve decrease the dexamethasone to once a day.  The patient states his left facial weakness has already slightly improved.  As far as he knows, when he blinks both eyes are closing now.  CTA chest was done to evaluate for pneumonia versus pneumonitis versus pulmonary embolism.  PHYSICAL EXAM:  Blood pressure 137/85, pulse (!) 106, temperature 98.5 F (36.9 C), temperature source Oral, resp. rate 20, height 5' 9.2" (1.758 m), weight 157 lb 14.4 oz (71.6 kg), SpO2 95 %. Wt Readings from Last 3 Encounters:  03/24/22 157 lb 14.4 oz (71.6 kg)  03/13/22 158 lb (71.7 kg)  03/11/22 158 lb 9.6 oz (71.9 kg)   Body mass index is 23.18 kg/m.  Performance status (ECOG): 1 - Symptomatic but completely ambulatory  Physical Exam Constitutional:      Appearance: Normal appearance.  Cardiovascular:     Rate and Rhythm: Regular rhythm. Tachycardia present.     Heart sounds: Normal heart sounds.  Pulmonary:     Effort: Pulmonary effort is normal.     Breath sounds: Rhonchi (Diffuse rhonchi) present.  Musculoskeletal:        General: No swelling.     Right lower leg: No edema.     Left lower leg: No edema.   Neurological:     Mental Status: He is alert.     Cranial Nerves: Facial asymmetry (Persistent mild left facial weakness) present.     Sensory: Sensation is intact.     LABS:      Latest Ref Rng & Units 03/11/2022   12:00 AM 02/18/2022   12:00 AM 02/04/2022   12:00 AM  CBC  WBC  7.8     8.4     5.2      Hemoglobin 13.5 - 17.5 14.2     13.4     13.6      Hematocrit 41 - 53 41     39     40      Platelets 150 - 400 K/uL 401     415     379         This result is from an external source.      Latest Ref Rng & Units 03/11/2022   12:00 AM 02/18/2022   12:00 AM 01/14/2022   12:00 AM  CMP  BUN 4 - '21 12     11     15      '$ Creatinine 0.6 - 1.3 0.7     0.8     0.7      Sodium 137 - 147 138     137     136  Potassium 3.5 - 5.1 mEq/L 4.7     4.2     4.6      Chloride 99 - 108 101     101     100      CO2 13 - '22 29     31     30      '$ Calcium 8.7 - 10.7 9.5     9.3     9.6      Alkaline Phos 25 - 125 106     66     76      AST 14 - 40 41     33     31      ALT 10 - 40 U/L 58     15     26         This result is from an external source.     No results found for: "CEA1", "CEA" / No results found for: "CEA1", "CEA" No results found for: "PSA1" No results found for: "MVE720" No results found for: "CAN125"  No results found for: "TOTALPROTELP", "ALBUMINELP", "A1GS", "A2GS", "BETS", "BETA2SER", "GAMS", "MSPIKE", "SPEI" No results found for: "TIBC", "FERRITIN", "IRONPCTSAT" Lab Results  Component Value Date   LDH 145 12/19/2021       Component Value Date/Time   LDH 145 12/19/2021 1448    Review Flowsheet       Latest Ref Rng & Units 12/19/2021  Oncology Labs  LDH 98 - 192 U/L 145      STUDIES:  MR BRAIN W WO CONTRAST  Result Date: 03/24/2022 CLINICAL DATA:  Brain metastases, assess treatment response; metastatic melanoma EXAM: MRI HEAD WITHOUT AND WITH CONTRAST TECHNIQUE: Multiplanar, multiecho pulse sequences of the brain and surrounding structures were obtained  without and with intravenous contrast. CONTRAST:  7.1 mL Vueway COMPARISON:  01/04/2022 FINDINGS: Brain: Status post left pterional craniotomy and subjacent resection cavity, which demonstrates some intrinsic T1 hyperintense signal along the margin (series 600, image 178) and some thin linear enhancement (series 1100, image 187). Additional small areas of somewhat more nodular enhancement (series 1100, image 184). Improved associated edema. Heterogeneous fluid in the resection cavity. Right inferior frontal gyrus enhancing lesion measures 15 x 11 x 10 mm (series 1100, image 170 and series 10, image 20), previously 10 x 9 x 9 mm when remeasured similarly. Increased associated edema with 5 mm right-to-left midline shift at the level of the inferior frontal lobes, previously 3 mm. Previously noted left inferior frontal gyrus lesion no longer demonstrates enhancement. Lesion in the left aspect of the pons near the 7/8 cranial nerve root entry zone measures 11 x 8 x 8 mm (series 1100, image 136 and series 10, image 23), previously 11 x 10 x 9 mm. Stable to slightly decreased associated edema. No definite new lesion. Small focus of enhancement in the left parietal lobe (series 1100, image 230) is not associated with any edema and may be vascular. No evidence of acute infarct, hemorrhage, mass effect, or midline shift. No hydrocephalus. Vascular: Normal arterial flow voids. Skull and upper cervical spine: Left pterional craniotomy. Otherwise normal marrow signal. Sinuses/Orbits: No acute finding. Other: The mastoids are well aerated. IMPRESSION: 1. Left temporal resection cavity demonstrates thin linear enhancement along the resection margins, which may be postoperative, with additional small areas of somewhat more nodular enhancement, which are nonspecific but could indicate recurrent disease. Attention on follow-up. 2. Interval increase in the size of the right inferior frontal gyrus enhancing  lesion with increased  surrounding edema, mild local mass effect, and mild local midline shift. 3. Stable to slightly decreased size of the left pontine lesion with decreased associated edema. 4. Previously noted left inferior frontal gyrus lesion is no longer visualized. 5. No definite new lesion. Electronically Signed   By: Merilyn Baba M.D.   On: 03/24/2022 11:38       Exam(s): [id number] CT/CT ANGIO CHEST  CLINICAL DATA: A 51 year old male with history of melanoma with  metastatic disease to the brain presents with shortness of breath  for 2 days. Currently on immunotherapy by report. Also with history  of metastatic lesions in the lung on prior imaging.  * Tracking Code: BO *   EXAM:  CT ANGIOGRAPHY CHEST WITH CONTRAST  TECHNIQUE:  Multidetector CT imaging of the chest was performed using the  standard protocol during bolus administration of intravenous  contrast. Multiplanar CT image reconstructions and MIPs were  obtained to evaluate the vascular anatomy.   RADIATION DOSE REDUCTION: This exam was performed according to the  departmental dose-optimization program which includes automated  exposure control, adjustment of the mA and/or kV according to  patient size and/or use of iterative reconstruction technique.  CONTRAST: 80 cc Optiray 350   COMPARISON: Comparison made with Nov 29, 2021   FINDINGS:  Cardiovascular: Central pulmonary artery is opacified to 319  Hounsfield units. There is a tiny nonocclusive segmental level  pulmonary embolism in the inferior LEFT upper lobe (image 117) no  lobar level pulmonary emboli. No additional signs of pulmonary  embolism.  The aorta is of normal caliber. Heart size is normal without  pericardial effusion or nodularity. Calcified coronary artery  disease.  RIGHT-sided Port-A-Cath in situ terminating near the caval to atrial  junction.  Mediastinum/Nodes: Small lymph nodes throughout the mediastinum,  none displaying pathologic enlargement though there is  increase in The size of subcarinal nodal tissue measuring 12 mm, previously  measuring approximately 5 mm (image 56/2) increase in size of  central LEFT hilar nodal tissue between the aorta and LEFT mainstem  bronchus (image 64/2) 10 mm.  LEFT hilar lymph node (image 62/2) 11 mm previously less than a cm.  No internal mammary adenopathy. No axillary lymphadenopathy.  Lungs/Pleura: Areas of ground-glass opacity in the RIGHT upper lobe.  These are in the anterior RIGHT upper lobe and are non masslike, on  the RIGHT area measuring 2.4 x 1.5 cm and on the LEFT area measuring  2.2 x 1.3 cm.  RIGHT upper lobe nodule (image 63/3 of 2) 8 mm previously 19 mm.  (Image 71/302) 4 mm RIGHT middle lobe nodule previously 8 mm.  (Image 82/302) 7 mm RIGHT middle lobe pulmonary nodule previously as  large as 22 mm on the previous study in the axial plane. No  pneumothorax. Similar decrease in size of other pulmonary nodules  that were seen in the bilateral chest.  Diffuse bronchial wall thickening and patchy ground-glass opacities  in the lung bases along with juxta diaphragmatic airspace process at  the LEFT lung base.  Upper Abdomen: Incidental imaging of upper abdominal contents  without acute process to the extent evaluated.  Musculoskeletal: Post corpectomy and lateral spinal fusion spanning  the L1 level, incompletely evaluated. No acute bone finding or  destructive bone process.  Review of the MIP images confirms the above findings.   IMPRESSION:  1. Small nonocclusive segmental level pulmonary embolism in the  inferior LEFT upper lobe.  2. Marked bronchial wall thickening with  areas of patchy basilar  opacities and new ground-glass opacities in the upper lobes.  Constellation of findings may be infectious bronchiolitis/bronchitis  though the possibility of drug related changes in the setting of  immunotherapy are also considered. Furthermore, given basilar  predominance and some material in  basilar bronchial structures would  correlate with any risk factors for or history of aspiration.  3. Response to therapy of bilateral metastatic lesions. Some lymph  nodes in the chest with interval increase in size are equivocal at  this time, potentially reactive, but warrant attention on subsequent  imaging.  Critical Value/emergent results were called by telephone at the time  of interpretation on 03/24/2022 at 5:21 pm to provider Dr. Federico Flake,  who verbally acknowledged these results.     ASSESSMENT & PLAN:   Assessment:  51 y.o. male with metastatic melanoma to lung and brain.  He has a slight increase in the right frontal brain lesion.  On CTA chest he has a small nonocclusive segmental left upper lobe pulmonary embolism.  There are changes consistent with infectious bronchiolitis/bronchitis, though the possibility of drug-related changes is also considered.  There is a decrease in the pulmonary nodules, with a slight increase in the lymph nodes in the chest.  Increased adenopathy is commonly seen with immunotherapy.  Given his symptoms of productive cough, the changes in the lungs are most likely due to infection.  Plan: For the increase in the brain lesion, I will start him on dexamethasone 4 mg twice daily for 3 days, then have him decrease to once a day if his symptoms improve per Dr. Renda Rolls recommendations.  We will schedule him to see Dr. Tammi Klippel and Dr. Mickeal Skinner. For the likely pulmonary infection, I will start him on levofloxacin 500 mg daily for 10 days.  He knows to contact our office if his symptoms do not improve. For the pulmonary embolism, I will place him on rivaroxaban 15 mg twice daily for 3 weeks, then switch to rivaroxaban 20 mg daily.  He knows to contact our office if he has abnormal bleeding or bruising.  Due to him being on the steroids, we will need to hold immunotherapy next week, but we will have him keep his appointment as scheduled.    The patient understands  all the plans discussed today and is in agreement with them.  I also wrote down all the instructions for him.  He knows to contact our office if he develops concerns prior to his next appointment  I provided 30 minutes of face-to-face time during this encounter and > 50% was spent counseling as documented under my assessment and plan.   [name], PA-C

## 2022-03-24 NOTE — Telephone Encounter (Signed)
Scheduled appt per 9/18 referral. Pt is aware of appt date and time. Pt is aware to arrive 15 mins prior to appt time and to bring and updated insurance card. Pt is aware of appt location.

## 2022-03-25 ENCOUNTER — Other Ambulatory Visit: Payer: Self-pay | Admitting: Hematology and Oncology

## 2022-03-25 ENCOUNTER — Encounter: Payer: Self-pay | Admitting: Hematology and Oncology

## 2022-03-25 ENCOUNTER — Telehealth: Payer: Self-pay

## 2022-03-25 ENCOUNTER — Other Ambulatory Visit: Payer: Self-pay | Admitting: Pharmacist

## 2022-03-25 DIAGNOSIS — C439 Malignant melanoma of skin, unspecified: Secondary | ICD-10-CM

## 2022-03-25 DIAGNOSIS — C787 Secondary malignant neoplasm of liver and intrahepatic bile duct: Secondary | ICD-10-CM

## 2022-03-25 DIAGNOSIS — C7931 Secondary malignant neoplasm of brain: Secondary | ICD-10-CM

## 2022-03-25 DIAGNOSIS — C7800 Secondary malignant neoplasm of unspecified lung: Secondary | ICD-10-CM

## 2022-03-25 LAB — RESPIRATORY VIRUS PANEL
Influenza A: NEGATIVE
Influenza B: NEGATIVE
Resp Syncytial Virus by PCR: NEGATIVE
SARS Coronavirus 2: NEGATIVE

## 2022-03-25 MED ORDER — RIVAROXABAN 15 MG PO TABS: 15.0000 mg | ORAL_TABLET | Freq: Two times a day (BID) | ORAL | 0 refills | Status: DC

## 2022-03-25 NOTE — Telephone Encounter (Signed)
-----   Message from [name], PA-C sent at 03/25/2022  9:58 AM EDT ----- Please let him know his viral panel was neg for flu, covid and rsv. If he doesn't have improvement in his symptoms by tomorrow, let us know. Thanks

## 2022-03-25 NOTE — Telephone Encounter (Signed)
Patient voiced he is feeling some better today , started steroid and blood thinner last night . Pharmacy to have his antibiotic today and he will pick up and start.  Made aware of negative lab test from yesterday  and encouraged to call back if not improving or with any changes or concerns.

## 2022-03-26 ENCOUNTER — Telehealth: Payer: Self-pay | Admitting: Internal Medicine

## 2022-03-26 NOTE — Telephone Encounter (Signed)
R/s pt's new appt with Dr. Mickeal Skinner per 9/19 secure chat with RN Manuela Schwartz. Spoke to pt who is aware of new appt date/time.

## 2022-03-27 ENCOUNTER — Encounter: Payer: Self-pay | Admitting: Oncology

## 2022-03-27 NOTE — Progress Notes (Signed)
Patient Care Team: Earlyne Iba, NP as PCP - General (Nurse Practitioner) Derwood Kaplan, MD as Consulting Physician (Oncology) Gatha Mayer, MD as Consulting Physician (Radiation Oncology)  Clinic Day: 04/01/2022  Referring physician: Earlyne Iba, NP  ASSESSMENT & PLAN:   Assessment & Plan: Melanoma of skin ([place]) Stage IIB malignant melanoma of the right posterior shoulder, Breslow's depth 3.3 mm, Clark level IV, treated with wide local excision, diagnosed in May 2021.  There was still a question as to whether this represented a metastatic lesion, rather than a primary, based on the presence of 2 nodules and no epidermal involvement.  MRI head and CT chest, abdomen and pelvis in June were negative for metastatic disease.   No adjuvant therapy was recommended.  PET scan in September was negative for metastatic disease. Chest x-ray in June 2022 was negative.   Metastasis to subcutaneous tissue  He presented with a new area of concern to the left mid back in June of this year. A superficial nodule is noted approximately 1 cm to the right of his upper thoracic spine. He cannot fully assess the area due to location, but feels like it has increased in size. There is no redness or warmth noted to the area.  He was sent for biopsy and that was performed on June 13 and confirmed metastatic malignant melanoma.  Melan-A was positive S100 was positive SOX10 was positive and PRAME positive.  We will order BRAF testing and a melanoma panel through [facility].  He has several hypermetabolic subcutaneous nodules seen.  Metastasis to liver He has a small hypermetabolic liver lesion which is new and less than 1 cm but suspicious for metastasis.  Metastasis to lymph nodes He has multiple hypermetabolic nodes of the retroperitoneal and right pelvic area as well as right external iliac and a few additional nodes.  Pulmonary metastases He has bilateral hypermetabolic pulmonary nodules which are  increased in size compared with the CT chest performed in May of this year.  The largest nodule is up to 2.3 cm in diameter.  Metastases to brain This was suspicious on the PET scan in the left frontoparietal lobe and so he had an MRI of the brain.  This confirmed a 2.6 cm heterogeneous lesion of the left superior temporal gyrus with mild enhancement and extensive surrounding edema.  He also has an 8 mm cortical lesion of the inferomedial right frontal lobe with mild edema, a 2 mm mildly enhancing cortical lesion of the inferolateral left frontal lobe and a intrinsically T1 hyperintense lesion of the left lateral pons measuring 7 mm with surrounding edema.  He has 4 lesions in total but 3 are less than 1 cm.  He was on dexamethasone but tapered off, and has received stereotactic radiation. He had neurologic symptoms in September and was placed on dexamethasone. Scans did show some cerebral edema so he will be slowly tapered off.    Multiple dysplastic nevi Multiple dysplastic nevi of the right lower back with severe atypia.  The nevus of the medial back had positive peripheral margins, so he underwent re-excision of this area as well, which only revealed dermal scar. Dysplastic compound nevus of the left mid buttock with moderate atypia.  Margins were free of neoplasm.    Family history of breast cancer Maternal family history of breast cancer in addition to his melanoma history which raises suspicion for possible BRCA mutation.  He met with the genetic counselors and [facility] RNA + hereditary cancer panel revealed  variants of uncertain significance (VUS) of the APC, CDKN1C and FH genes.     We will plan to see him back in 4 weeks with CBC and CMP for repeat evaluation before his fourth cycle of immunotherapy 2 days after. He will resume immunotherapy on October 9th, with labs a few days before. He will then go onto maintenance Nivolumab, every 2 weeks at first and then every 4 weeks. The patient  understands the plans discussed today and is in agreement with them.  He knows to contact our office if she develops concerns prior to her next appointment. I have answered his questions and reviewed this information in detail.The patient understands the plans discussed today and is in agreement with them.  He knows to contact our office if he develops concerns prior to his next .    Derwood Kaplan, MD  [facility] [address] [place] [address] Dept: [phone] Dept Fax: [phone]   Orders Placed This Encounter  Procedures   CBC and differential    This external order was created through the Results Console.   CBC    This external order was created through the Results Console.   Basic metabolic panel    This external order was created through the Results Console.   Comprehensive metabolic panel    This external order was created through the Results Console.   Hepatic function panel    This external order was created through the Results Console.      CHIEF COMPLAINT:  CC: Metastatic melanoma  Current Treatment: Corticosteroids and brain irradiation, followed by immunotherapy  INTERVAL HISTORY:  Shomari is here today for repeat clinical assessment.  His 3rd dose of immunotherapy was about 3 weeks ago, he will be getting his 4th dose of immunotherapy the second week of October. He states he was having trouble with his face describing it as loss of muscle control. His most recent MRI scan reveled some swelling. He was placed on steroids but was improving prior to it. He was prescribed 1 mg of dexamethasone 3 times a day and will decrease by 1 mg each week. He states he is still working full time. As for treatment, he states he is doing fair. He also states he had bronchitis which is resolving.  His appetite is fair. He has lost 1 pound since his last visit. He denies pain. CBC and CMP are normal. Dr.Vaslow is  involved in his case.   I have reviewed the past medical history, past surgical history, social history and family history with the patient and they are unchanged from previous note.  ALLERGIES:  is allergic to testosterone and sulfa antibiotics.  MEDICATIONS:  Current Outpatient Medications  Medication Sig Dispense Refill   acyclovir (ZOVIRAX) 200 MG capsule Take by mouth.     amitriptyline (ELAVIL) 25 MG tablet Take 50 mg by mouth at bedtime.     dexamethasone (DECADRON) 1 MG tablet Take 3 tablets (3 mg total) by mouth daily with breakfast for 7 days, THEN 2 tablets (2 mg total) daily with breakfast for 7 days, THEN 1 tablet (1 mg total) daily with breakfast for 7 days. 42 tablet 0   dexamethasone (DECADRON) 4 MG tablet Take by mouth.     ergocalciferol (VITAMIN D2) 1.25 MG (50000 UT) capsule Take 50,000 Units by mouth every Wednesday.     hydrOXYzine (ATARAX) 25 MG tablet Take 1 tablet (25 mg total) by mouth 3 (  three) times daily as needed for itching. (Patient not taking: Reported on 03/31/2022) 60 tablet 3   levofloxacin (LEVAQUIN) 500 MG tablet Take 1 tablet (500 mg total) by mouth daily. 10 tablet 0   lidocaine-prilocaine (EMLA) cream Apply 1 Application topically as needed. 30 g 0   ondansetron (ZOFRAN) 4 MG tablet Take 1 tablet (4 mg total) by mouth every 4 (four) hours as needed for nausea. (Patient not taking: Reported on 03/31/2022) 90 tablet 3   prochlorperazine (COMPAZINE) 10 MG tablet Take 1 tablet (10 mg total) by mouth every 6 (six) hours as needed for nausea or vomiting. (Patient not taking: Reported on 03/31/2022) 90 tablet 3   Rivaroxaban (XARELTO) 15 MG TABS tablet Take 1 tablet (15 mg total) by mouth 2 (two) times daily with a meal. For 21 days, then switch to 20 mg daily 60 tablet 0   rivaroxaban (XARELTO) 20 MG TABS tablet Take 1 tablet (20 mg total) by mouth daily with supper. Start after completing 3 weeks of Xarelto 15 mg twice daily (Patient not taking: Reported on  03/31/2022) 30 tablet 3   No current facility-administered medications for this visit.    HISTORY OF PRESENT ILLNESS:   Oncology History  Melanoma of skin ([place])  12/01/2019 Initial Diagnosis   Melanoma of skin ([place])   12/05/2019 Cancer Staging   Staging form: Melanoma of the Skin, AJCC 8th Edition - Clinical stage from 12/05/2019: Stage IIA (cT3a, cN0, cM0) - Signed by Derwood Kaplan, MD on 05/30/2020   02/18/2021 Genetic Testing   No pathogenic variants detected in [facility] Multi-Cancer +RNA Panel.  Variants of uncertain significance detected in APC (c.681C>G (p.Asp227Glu)), CDKN1C (c.610C>G (p.Pro204Ala)), and FH (c.1151C>T (p.Ala384Val)).  The report date is February 18, 2021.    The Multi-Cancer + RNA Panel offered by [facility] includes sequencing and/or deletion/duplication analysis of the following 84 genes:  AIP*, ALK, APC*, ATM*, AXIN2*, BAP1*, BARD1*, BLM*, BMPR1A*, BRCA1*, BRCA2*, BRIP1*, CASR, CDC73*, CDH1*, CDK4, CDKN1B*, CDKN1C*, CDKN2A, CEBPA, CHEK2*, CTNNA1*, DICER1*, DIS3L2*, EGFR, EPCAM, FH*, FLCN*, GATA2*, GPC3, GREM1, HOXB13, HRAS, KIT, MAX*, MEN1*, MET, MITF, MLH1*, MSH2*, MSH3*, MSH6*, MUTYH*, NBN*, NF1*, NF2*, NTHL1*, PALB2*, PDGFRA, PHOX2B, PMS2*, POLD1*, POLE*, POT1*, PRKAR1A*, PTCH1*, PTEN*, RAD50*, RAD51C*, RAD51D*, RB1*, RECQL4, RET, RUNX1*, SDHA*, SDHAF2*, SDHB*, SDHC*, SDHD*, SMAD4*, SMARCA4*, SMARCB1*, SMARCE1*, STK11*, SUFU*, TERC, TERT, TMEM127*, Tp53*, TSC1*, TSC2*, VHL*, WRN*, and WT1.  RNA analysis is performed for * genes.   01/30/2022 - 02/20/2022 Chemotherapy   Patient is on Treatment Plan : MELANOMA Nivolumab + Ipilimumab (1/3) q21d / Nivolumab q28d     01/30/2022 -  Chemotherapy   Patient is on Treatment Plan : MELANOMA Nivolumab (1) + Ipilimumab (3) q21d / Nivolumab (480) q28d     Malignant melanoma metastatic to brain ([place])  12/19/2021 Initial Diagnosis   Malignant melanoma metastatic to brain ([place])   01/30/2022 - 02/20/2022 Chemotherapy   Patient is on  Treatment Plan : MELANOMA Nivolumab + Ipilimumab (1/3) q21d / Nivolumab q28d     01/30/2022 -  Chemotherapy   Patient is on Treatment Plan : MELANOMA Nivolumab (1) + Ipilimumab (3) q21d / Nivolumab (480) q28d     Malignant melanoma metastatic to lung ([place])  12/19/2021 Initial Diagnosis   Malignant melanoma metastatic to lung ([place])   01/30/2022 - 02/20/2022 Chemotherapy   Patient is on Treatment Plan : MELANOMA Nivolumab + Ipilimumab (1/3) q21d / Nivolumab q28d     01/30/2022 -  Chemotherapy   Patient is on Treatment Plan :  MELANOMA Nivolumab (1) + Ipilimumab (3) q21d / Nivolumab (480) q28d     Malignant neoplasm metastatic to liver ([place])  12/19/2021 Initial Diagnosis   Malignant neoplasm metastatic to liver ([place])   01/30/2022 - 02/20/2022 Chemotherapy   Patient is on Treatment Plan : MELANOMA Nivolumab + Ipilimumab (1/3) q21d / Nivolumab q28d     01/30/2022 -  Chemotherapy   Patient is on Treatment Plan : MELANOMA Nivolumab (1) + Ipilimumab (3) q21d / Nivolumab (480) q28d         REVIEW OF SYSTEMS:   Constitutional: Denies fevers, chills or abnormal weight loss Eyes: Denies blurriness of vision Ears, nose, mouth, throat, and face: Denies mucositis or sore throat Respiratory: Denies cough, dyspnea or wheezes Cardiovascular: Denies palpitation, chest discomfort or lower extremity swelling Gastrointestinal:  Denies nausea, heartburn or change in bowel habits Skin: Denies abnormal skin rashes Lymphatics: Denies new lymphadenopathy or easy bruising Neurological:Denies numbness, tingling or new weaknesses Behavioral/Psych: Mood is stable, no new changes  All other systems were reviewed with the patient and are negative.   VITALS:  Blood pressure 119/75, pulse 99, temperature 98 F (36.7 C), temperature source Oral, resp. rate 18, height 5' 9"  (1.753 m), weight 159 lb 8 oz (72.3 kg), SpO2 96 %.  Wt Readings from Last 3 Encounters:  04/14/22 158 lb (71.7 kg)  04/01/22 159 lb 8 oz (72.3  kg)  03/31/22 160 lb 8 oz (72.8 kg)    Body mass index is 23.55 kg/m.  Performance status (ECOG): 1 - Symptomatic but completely ambulatory  PHYSICAL EXAM:   GENERAL:alert, no distress and comfortable SKIN: skin color, texture, turgor are normal, no rashes or significant lesions.  He has an additional nodule of the left flank which is smaller but firm. EYES: normal, Conjunctiva are pink and non-injected, sclera clear OROPHARYNX:no exudate, no erythema and lips, buccal mucosa, and tongue normal  NECK: supple, thyroid normal size, non-tender, without nodularity LYMPH:  no palpable lymphadenopathy in the cervical, axillary or inguinal LUNGS: clear to auscultation and percussion with normal breathing effort Occasional wheeze all lung feels except upper lobe HEART: regular rate & rhythm with tachycardia, and no lower extremity edema ABDOMEN:abdomen soft, non-tender and normal bowel sounds Musculoskeletal:no cyanosis of digits and no clubbing  NEURO: alert & oriented x 3 with fluent speech, no focal motor/sensory deficits Port colon right upper chest maculopapular rash surrounding superior portion. Looks like allergic rash  Large scars bilateral lower back, posteriorly. Mass in upper thoracic posterior area measuring 4 x 4 cm ,  firm   LABORATORY DATA:  I have reviewed the data as listed    Component Value Date/Time   NA 140 04/10/2022 0000   K 4.1 04/10/2022 0000   CL 103 04/10/2022 0000   CO2 32 (A) 04/10/2022 0000   BUN 17 04/10/2022 0000   CREATININE 0.9 04/10/2022 0000   CALCIUM 9.5 04/10/2022 0000   ALBUMIN 4.2 04/10/2022 0000   AST 26 04/10/2022 0000   ALT 17 04/10/2022 0000   ALKPHOS 69 04/10/2022 0000    No results found for: "SPEP", "UPEP"  Lab Results  Component Value Date   WBC 7.3 04/10/2022   NEUTROABS 4.38 04/10/2022   HGB 13.7 04/10/2022   HCT 41 04/10/2022   MCV 93 03/11/2022   PLT 356 04/10/2022      Chemistry      Component Value Date/Time   NA  140 04/10/2022 0000   K 4.1 04/10/2022 0000   CL 103 04/10/2022 0000  CO2 32 (A) 04/10/2022 0000   BUN 17 04/10/2022 0000   CREATININE 0.9 04/10/2022 0000   GLU 134 04/10/2022 0000      Component Value Date/Time   CALCIUM 9.5 04/10/2022 0000   ALKPHOS 69 04/10/2022 0000   AST 26 04/10/2022 0000   ALT 17 04/10/2022 0000       RADIOGRAPHIC STUDIES: I have personally reviewed the radiological images as listed and agreed with the findings in the report. MR BRAIN W WO CONTRAST  Result Date: 03/24/2022 CLINICAL DATA:  Brain metastases, assess treatment response; metastatic melanoma EXAM: MRI HEAD WITHOUT AND WITH CONTRAST TECHNIQUE: Multiplanar, multiecho pulse sequences of the brain and surrounding structures were obtained without and with intravenous contrast. CONTRAST:  7.1 mL V[employer] COMPARISON:  01/04/2022 FINDINGS: Brain: Status post left pterional craniotomy and subjacent resection cavity, which demonstrates some intrinsic T1 hyperintense signal along the margin (series 600, image 178) and some thin linear enhancement (series 1100, image 187). Additional small areas of somewhat more nodular enhancement (series 1100, image 184). Improved associated edema. Heterogeneous fluid in the resection cavity. Right inferior frontal gyrus enhancing lesion measures 15 x 11 x 10 mm (series 1100, image 170 and series 10, image 20), previously 10 x 9 x 9 mm when remeasured similarly. Increased associated edema with 5 mm right-to-left midline shift at the level of the inferior frontal lobes, previously 3 mm. Previously noted left inferior frontal gyrus lesion no longer demonstrates enhancement. Lesion in the left aspect of the pons near the 7/8 cranial nerve root entry zone measures 11 x 8 x 8 mm (series 1100, image 136 and series 10, image 23), previously 11 x 10 x 9 mm. Stable to slightly decreased associated edema. No definite new lesion. Small focus of enhancement in the left parietal lobe (series 1100,  image 230) is not associated with any edema and may be vascular. No evidence of acute infarct, hemorrhage, mass effect, or midline shift. No hydrocephalus. Vascular: Normal arterial flow voids. Skull and upper cervical spine: Left pterional craniotomy. Otherwise normal marrow signal. Sinuses/Orbits: No acute finding. Other: The mastoids are well aerated. IMPRESSION: 1. Left temporal resection cavity demonstrates thin linear enhancement along the resection margins, which may be postoperative, with additional small areas of somewhat more nodular enhancement, which are nonspecific but could indicate recurrent disease. Attention on follow-up. 2. Interval increase in the size of the right inferior frontal gyrus enhancing lesion with increased surrounding edema, mild local mass effect, and mild local midline shift. 3. Stable to slightly decreased size of the left pontine lesion with decreased associated edema. 4. Previously noted left inferior frontal gyrus lesion is no longer visualized. 5. No definite new lesion. Electronically Signed   By: Merilyn Baba M.D.   On: 03/24/2022 11:38   EXAM : 01/04/2022 MRI BRAIN W WO CONTRAST  FINDINGS: Brain: New left side craniotomy. Smooth new widespread left hemisphere pachymeningeal thickening is probably postoperative in nature (series 11, image 17). Posterior left temporal/operculum resection cavity with heterogeneous internal contents, both T1 intrinsic and isointense. Some evidence of marginal diffusion restriction (series 2, image 27). No definite enhancement following contrast. Regional vasogenic edema and mass effect are mildly reduced.   T1 intrinsic left brainstem metastasis near the 7th/8th cranial nerve root entry zone is stable at 10 mm. Regional brainstem and left cerebellar peduncle edema does appear mildly increased on series 6, image 9.   Right inferior frontal gyrus T1 hypointense and enhancing metastasis is stable measuring 10 mm (series 10, image  41). Stable mild regional edema.   Nearby small contralateral left inferior frontal gyrus metastasis measuring 3-4 mm is stable on series 10, image 41, no edema.   No new brain metastasis identified.   Stable ventricle size and configuration. No other restricted diffusion. Cervicomedullary junction and pituitary are within normal limits. Normal basilar cisterns. Occasional small nonspecific cerebral white matter T2 and FLAIR hyperintense foci are stable.   Vascular: Major intracranial vascular flow voids are stable. The major dural venous sinuses are enhancing and appear to be patent.   Skull and upper cervical spine: Craniotomy. Negative visible cervical spine and spinal cord. Visualized bone marrow signal is within normal limits.   Sinuses/Orbits: Stable and negative.   Other: Mastoids are clear. Visible internal auditory structures appear normal. Postoperative changes to the left scalp.   IMPRESSION: 1. New left side craniotomy and gross total resection of left temporal/operculum metastasis. Mild resection margin ischemia is possible. Stable regional edema and mass effect. Mild smooth left hemisphere meningeal thickening is likely postoperative in nature.   2. The other three smaller brain metastases are stable, aside from mildly increased associated brainstem and left cerebellar peduncle edema.   3. No new brain metastasis identified.     EXAM: 12/27/2021 MRI BRAIN W WO CONTRAST  FINDINGS: Brain: There are 4 unchanged intraparenchymal masses:   1. Left temporal lobe, 3.0 cm series 11, image 85 2. Left frontal lobe 3 mm, image 79 3. Paramedian right frontal lobe 8 mm, image 75 4. Left middle cerebellar peduncle, 9 mm, image 41.   Edema surrounding the left temporal lesion is unchanged with regional mass effect but no midline shift. Evidence of chronic hemorrhage within the left temporal and left cerebellar peduncular lesion. No new lesions. No acute infarct. No  extra-axial collection.   Vascular: Normal flow voids.   Skull and upper cervical spine: Normal marrow signal.   Sinuses/Orbits: Negative.   Other: None   IMPRESSION: Unchanged size and appearance of 4 intraparenchymal metastases. No new lesions.     I,Gabriella Ballesteros,acting as a scribe for Derwood Kaplan, MD.,have documented all relevant documentation on the behalf of Derwood Kaplan, MD,as directed by  Derwood Kaplan, MD while in the presence of Derwood Kaplan, MD.

## 2022-03-31 ENCOUNTER — Other Ambulatory Visit: Payer: Self-pay

## 2022-03-31 ENCOUNTER — Encounter: Payer: Self-pay | Admitting: Oncology

## 2022-03-31 ENCOUNTER — Inpatient Hospital Stay (HOSPITAL_BASED_OUTPATIENT_CLINIC_OR_DEPARTMENT_OTHER): Payer: Medicare Other | Admitting: Internal Medicine

## 2022-03-31 VITALS — BP 127/86 | HR 115 | Temp 98.6°F | Resp 18 | Ht 69.0 in | Wt 160.5 lb

## 2022-03-31 DIAGNOSIS — Z87891 Personal history of nicotine dependence: Secondary | ICD-10-CM

## 2022-03-31 DIAGNOSIS — Z5112 Encounter for antineoplastic immunotherapy: Secondary | ICD-10-CM | POA: Diagnosis not present

## 2022-03-31 DIAGNOSIS — C4361 Malignant melanoma of right upper limb, including shoulder: Secondary | ICD-10-CM

## 2022-03-31 DIAGNOSIS — C778 Secondary and unspecified malignant neoplasm of lymph nodes of multiple regions: Secondary | ICD-10-CM

## 2022-03-31 DIAGNOSIS — C787 Secondary malignant neoplasm of liver and intrahepatic bile duct: Secondary | ICD-10-CM

## 2022-03-31 DIAGNOSIS — C7800 Secondary malignant neoplasm of unspecified lung: Secondary | ICD-10-CM | POA: Diagnosis not present

## 2022-03-31 DIAGNOSIS — Z79899 Other long term (current) drug therapy: Secondary | ICD-10-CM | POA: Diagnosis not present

## 2022-03-31 DIAGNOSIS — C7931 Secondary malignant neoplasm of brain: Secondary | ICD-10-CM | POA: Diagnosis not present

## 2022-03-31 MED ORDER — DEXAMETHASONE 1 MG PO TABS: ORAL_TABLET | ORAL | 0 refills | Status: AC

## 2022-03-31 NOTE — Progress Notes (Signed)
[facility] [place], [place] [address] [phone]   New Patient Evaluation  Date of Service: 03/31/22 Patient Name: Xavier Jones Patient MRN: [id number] Patient DOB: 08-15-1970 Provider: [name], MD  Identifying Statement:  Xavier Jones is a 51 y.o. male with Malignant melanoma metastatic to brain [facility]) who presents for initial consultation and evaluation regarding cancer associated neurologic deficits.    Referring Provider: Derwood Kaplan, MD [place],  [place] [id number]  Primary Cancer:  Oncologic History: Oncology History  Melanoma of skin ([place])  12/01/2019 Initial Diagnosis   Melanoma of skin ([place])   12/05/2019 Cancer Staging   Staging form: Melanoma of the Skin, AJCC 8th Edition - Clinical stage from 12/05/2019: Stage IIA (cT3a, cN0, cM0) - Signed by Derwood Kaplan, MD on 05/30/2020   02/18/2021 Genetic Testing   No pathogenic variants detected in [facility] Multi-Cancer +RNA Panel.  Variants of uncertain significance detected in APC (c.681C>G (p.Asp227Glu)), CDKN1C (c.610C>G (p.Pro204Ala)), and FH (c.1151C>T (p.Ala384Val)).  The report date is February 18, 2021.    The Multi-Cancer + RNA Panel offered by [facility] includes sequencing and/or deletion/duplication analysis of the following 84 genes:  AIP*, ALK, APC*, ATM*, AXIN2*, BAP1*, BARD1*, BLM*, BMPR1A*, BRCA1*, BRCA2*, BRIP1*, CASR, CDC73*, CDH1*, CDK4, CDKN1B*, CDKN1C*, CDKN2A, CEBPA, CHEK2*, CTNNA1*, DICER1*, DIS3L2*, EGFR, EPCAM, FH*, FLCN*, GATA2*, GPC3, GREM1, HOXB13, HRAS, KIT, MAX*, MEN1*, MET, MITF, MLH1*, MSH2*, MSH3*, MSH6*, MUTYH*, NBN*, NF1*, NF2*, NTHL1*, PALB2*, PDGFRA, PHOX2B, PMS2*, POLD1*, POLE*, POT1*, PRKAR1A*, PTCH1*, PTEN*, RAD50*, RAD51C*, RAD51D*, RB1*, RECQL4, RET, RUNX1*, SDHA*, SDHAF2*, SDHB*, SDHC*, SDHD*, SMAD4*, SMARCA4*, SMARCB1*, SMARCE1*, STK11*, SUFU*, TERC, TERT, TMEM127*, Tp53*, TSC1*, TSC2*, VHL*,  WRN*, and WT1.  RNA analysis is performed for * genes.   01/30/2022 - 02/20/2022 Chemotherapy   Patient is on Treatment Plan : MELANOMA Nivolumab + Ipilimumab (1/3) q21d / Nivolumab q28d     01/30/2022 -  Chemotherapy   Patient is on Treatment Plan : MELANOMA Nivolumab (1) + Ipilimumab (3) q21d / Nivolumab (480) q28d     Malignant melanoma metastatic to brain ([place])  12/19/2021 Initial Diagnosis   Malignant melanoma metastatic to brain ([place])   01/30/2022 - 02/20/2022 Chemotherapy   Patient is on Treatment Plan : MELANOMA Nivolumab + Ipilimumab (1/3) q21d / Nivolumab q28d     01/30/2022 -  Chemotherapy   Patient is on Treatment Plan : MELANOMA Nivolumab (1) + Ipilimumab (3) q21d / Nivolumab (480) q28d     Malignant melanoma metastatic to lung ([place])  12/19/2021 Initial Diagnosis   Malignant melanoma metastatic to lung ([place])   01/30/2022 - 02/20/2022 Chemotherapy   Patient is on Treatment Plan : MELANOMA Nivolumab + Ipilimumab (1/3) q21d / Nivolumab q28d     01/30/2022 -  Chemotherapy   Patient is on Treatment Plan : MELANOMA Nivolumab (1) + Ipilimumab (3) q21d / Nivolumab (480) q28d     Malignant neoplasm metastatic to liver ([place])  12/19/2021 Initial Diagnosis   Malignant neoplasm metastatic to liver ([place])   01/30/2022 - 02/20/2022 Chemotherapy   Patient is on Treatment Plan : MELANOMA Nivolumab + Ipilimumab (1/3) q21d / Nivolumab q28d     01/30/2022 -  Chemotherapy   Patient is on Treatment Plan : MELANOMA Nivolumab (1) + Ipilimumab (3) q21d / Nivolumab (480) q28d      CNS Oncologic History 01/02/22: SRS to 4 metastases, including pre-op L temporal [facility]) 01/03/22: Craniotomy, resection L temporal ([facility])  History of  Present Illness: The patient's records from the referring physician were obtained and reviewed and the patient interviewed to confirm this HPI.  Xavier Jones presents today after 3 month post-SRS and craniotomy follow up.  He developed left sided weakness, mainly  facial droop, 1 week ago.  Denies arm or leg weakness during that time.  He was started on decadron, dosed 48m TID x3 days, then down to 459mdaily thereafter.  His weakness has improved back to near baseline.  He has no other complaints today, no seizures, no issues with gait.  Medications: Current Outpatient Medications on File Prior to Visit  Medication Sig Dispense Refill   acyclovir (ZOVIRAX) 200 MG capsule Take by mouth.     amitriptyline (ELAVIL) 25 MG tablet Take 50 mg by mouth at bedtime.     dexamethasone (DECADRON) 4 MG tablet Take 1 tablet (4 mg total) by mouth 2 (two) times daily. For 3 days, if improvement of symptoms, then decrease to once a day 30 tablet 0   ergocalciferol (VITAMIN D2) 1.25 MG (50000 UT) capsule Take 50,000 Units by mouth every Wednesday.     hydrOXYzine (ATARAX) 25 MG tablet Take 1 tablet (25 mg total) by mouth 3 (three) times daily as needed for itching. 60 tablet 3   levofloxacin (LEVAQUIN) 500 MG tablet Take 1 tablet (500 mg total) by mouth daily. 10 tablet 0   ondansetron (ZOFRAN) 4 MG tablet Take 1 tablet (4 mg total) by mouth every 4 (four) hours as needed for nausea. 90 tablet 3   prochlorperazine (COMPAZINE) 10 MG tablet Take 1 tablet (10 mg total) by mouth every 6 (six) hours as needed for nausea or vomiting. 90 tablet 3   Rivaroxaban (XARELTO) 15 MG TABS tablet Take 1 tablet (15 mg total) by mouth 2 (two) times daily with a meal. For 21 days, then switch to 20 mg daily 60 tablet 0   rivaroxaban (XARELTO) 20 MG TABS tablet Take 1 tablet (20 mg total) by mouth daily with supper. Start after completing 3 weeks of Xarelto 15 mg twice daily 30 tablet 3   No current facility-administered medications on file prior to visit.    Allergies:  Allergies  Allergen Reactions   Testosterone Itching    Gel    Sulfa Antibiotics Rash   Past Medical History:  Past Medical History:  Diagnosis Date   Depression    mild   Family history of breast cancer 01/22/2021    Family history of prostate cancer 01/22/2021   Genital herpes    History of back injury 2003   Pruritic rash 02/18/2022   Skin cancer    melanoma   Past Surgical History:  Past Surgical History:  Procedure Laterality Date   APPLICATION OF CRANIAL NAVIGATION N/A 01/03/2022   Procedure: APPLICATION OF CRANIAL NAVIGATION;  Surgeon: NuConsuella LoseMD;  [facility] Service: Neurosurgery;  Laterality: N/A;   CRANIOTOMY Left 01/03/2022   Procedure: Stereotactic Left temporal craniotomy for resection of tumor;  Surgeon: NuConsuella LoseMD;  [facility] Service: Neurosurgery;  Laterality: Left;   MELANOMA EXCISION WITH SENTINEL LYMPH NODE BIOPSY Right 2021   SPINAL FUSION W/ LUQUE UNIT ROD  2003   Social History:  Social History   Socioeconomic History   Marital status: Divorced    Spouse name: Not on file   Number of children: 0   Years of education: Not on file   Highest education level: Not on file  Occupational History  Not on file  Tobacco Use   Smoking status: Former    Types: Cigarettes    Quit date: 1998    Years since quitting: 25.7   Smokeless tobacco: Never  Vaping Use   Vaping Use: Never used  Substance and Sexual Activity   Alcohol use: Yes    Alcohol/week: 14.0 standard drinks of alcohol    Types: 14 Cans of beer per week    Comment: 2-3 beers per evening   Drug use: Not Currently   Sexual activity: Not on file  Other Topics Concern   Not on file  Social History Narrative   Not on file   Social Determinants of Health   Financial Resource Strain: Not on file  Food Insecurity: Not on file  Transportation Needs: Not on file  Physical Activity: Not on file  Stress: Not on file  Social Connections: Not on file  Intimate Partner Violence: Not on file   Family History:  Family History  Problem Relation Age of Onset   Prostate cancer Father        dx 68s   Breast cancer Maternal Aunt        dx after 16; bilateral mastectomy   Stomach  cancer Maternal Grandfather        dx 66s   Breast cancer Cousin 37       maternal male cousin; mets    Review of Systems: Constitutional: Doesn't report fevers, chills or abnormal weight loss Eyes: Doesn't report blurriness of vision Ears, nose, mouth, throat, and face: Doesn't report sore throat Respiratory: Doesn't report cough, dyspnea or wheezes Cardiovascular: Doesn't report palpitation, chest discomfort  Gastrointestinal:  Doesn't report nausea, constipation, diarrhea GU: Doesn't report incontinence Skin: Doesn't report skin rashes Neurological: Per HPI Musculoskeletal: Doesn't report joint pain Behavioral/Psych: Doesn't report anxiety  Physical Exam: Vitals:   03/31/22 1405  BP: 127/86  Pulse: (!) 115  Resp: 18  Temp: 98.6 F (37 C)  SpO2: 97%   KPS: 90. General: Alert, cooperative, pleasant, in no acute distress Head: Normal EENT: No conjunctival injection or scleral icterus.  Lungs: Resp effort normal Cardiac: Regular rate Abdomen: Non-distended abdomen Skin: No rashes cyanosis or petechiae. Extremities: No clubbing or edema  Neurologic Exam: Mental Status: Awake, alert, attentive to examiner. Oriented to self and environment. Language is fluent with intact comprehension.  Cranial Nerves: Visual acuity is grossly normal. Visual fields are full. Extra-ocular movements intact. No ptosis. L UMN facial paresis, mild Motor: Tone and bulk are normal. Power is full in both arms and legs. Reflexes are symmetric, no pathologic reflexes present.  Sensory: Intact to light touch Gait: Normal.   Labs: I have reviewed the data as listed    Component Value Date/Time   NA 138 03/11/2022 0000   K 4.7 03/11/2022 0000   CL 101 03/11/2022 0000   CO2 29 (A) 03/11/2022 0000   BUN 12 03/11/2022 0000   CREATININE 0.7 03/11/2022 0000   CALCIUM 9.5 03/11/2022 0000   ALBUMIN 4.2 03/11/2022 0000   AST 41 (A) 03/11/2022 0000   ALT 58 (A) 03/11/2022 0000   ALKPHOS 106  03/11/2022 0000   Lab Results  Component Value Date   WBC 7.8 03/11/2022   NEUTROABS 4.29 03/11/2022   HGB 14.2 03/11/2022   HCT 41 03/11/2022   MCV 93 03/11/2022   PLT 401 (A) 03/11/2022    Imaging:  MR BRAIN W WO CONTRAST  Result Date: 03/24/2022 CLINICAL DATA:  Brain metastases, assess treatment response;  metastatic melanoma EXAM: MRI HEAD WITHOUT AND WITH CONTRAST TECHNIQUE: Multiplanar, multiecho pulse sequences of the brain and surrounding structures were obtained without and with intravenous contrast. CONTRAST:  7.1 mL Vueway COMPARISON:  01/04/2022 FINDINGS: Brain: Status post left pterional craniotomy and subjacent resection cavity, which demonstrates some intrinsic T1 hyperintense signal along the margin (series 600, image 178) and some thin linear enhancement (series 1100, image 187). Additional small areas of somewhat more nodular enhancement (series 1100, image 184). Improved associated edema. Heterogeneous fluid in the resection cavity. Right inferior frontal gyrus enhancing lesion measures 15 x 11 x 10 mm (series 1100, image 170 and series 10, image 20), previously 10 x 9 x 9 mm when remeasured similarly. Increased associated edema with 5 mm right-to-left midline shift at the level of the inferior frontal lobes, previously 3 mm. Previously noted left inferior frontal gyrus lesion no longer demonstrates enhancement. Lesion in the left aspect of the pons near the 7/8 cranial nerve root entry zone measures 11 x 8 x 8 mm (series 1100, image 136 and series 10, image 23), previously 11 x 10 x 9 mm. Stable to slightly decreased associated edema. No definite new lesion. Small focus of enhancement in the left parietal lobe (series 1100, image 230) is not associated with any edema and may be vascular. No evidence of acute infarct, hemorrhage, mass effect, or midline shift. No hydrocephalus. Vascular: Normal arterial flow voids. Skull and upper cervical spine: Left pterional craniotomy. Otherwise  normal marrow signal. Sinuses/Orbits: No acute finding. Other: The mastoids are well aerated. IMPRESSION: 1. Left temporal resection cavity demonstrates thin linear enhancement along the resection margins, which may be postoperative, with additional small areas of somewhat more nodular enhancement, which are nonspecific but could indicate recurrent disease. Attention on follow-up. 2. Interval increase in the size of the right inferior frontal gyrus enhancing lesion with increased surrounding edema, mild local mass effect, and mild local midline shift. 3. Stable to slightly decreased size of the left pontine lesion with decreased associated edema. 4. Previously noted left inferior frontal gyrus lesion is no longer visualized. 5. No definite new lesion. Electronically Signed   By: Merilyn Baba M.D.   On: 03/24/2022 11:38    [place] Clinician Interpretation: I have personally reviewed the radiological images as listed.  My interpretation, in the context of the patient's clinical presentation, is likely treatment effect   Assessment/Plan Malignant melanoma metastatic to brain [facility])  Xavier Jones is clinically improved with steroids today after developing focal motor symptoms last week.  Because of facial predominance, we suspect this was secondary to inflammation within treated left pontine lesion.  Although the MRI demonstrated stability within this lesion, this was following several days of steroids.  Other treated foci clearly demonstrate radio-inflammatory effects, including right mesial frontal.   We recommended continued surveillance with serial imaging only at this time.  Decadron should be decreased by 33m each week until discontinued, if tolerated.    We spent twenty additional minutes teaching regarding the natural history, biology, and historical experience in the treatment of neurologic complications of cancer.   We appreciate the opportunity to participate in the care of [employer]   We ask that JPhilomena CourseSmeed return to clinic in 3 months following next brain MRI, or sooner as needed.    He will continue to follow with Dr. MHinton Raofor dual immunotherapy.    All questions were answered. The patient knows to call the clinic with any problems, questions or  concerns. No barriers to learning were detected.  The total time spent in the encounter was 45 minutes and more than 50% was on counseling and review of test results   [name], MD Medical Director of Neuro-Oncology [facility] 03/31/22 1:54 PM

## 2022-04-01 ENCOUNTER — Encounter: Payer: Self-pay | Admitting: Oncology

## 2022-04-01 ENCOUNTER — Inpatient Hospital Stay (INDEPENDENT_AMBULATORY_CARE_PROVIDER_SITE_OTHER): Payer: Medicare Other | Admitting: Oncology

## 2022-04-01 ENCOUNTER — Other Ambulatory Visit: Payer: Self-pay | Admitting: Pharmacist

## 2022-04-01 ENCOUNTER — Other Ambulatory Visit: Payer: Self-pay | Admitting: Hematology and Oncology

## 2022-04-01 ENCOUNTER — Other Ambulatory Visit: Payer: Self-pay

## 2022-04-01 ENCOUNTER — Inpatient Hospital Stay: Payer: Medicare Other

## 2022-04-01 ENCOUNTER — Other Ambulatory Visit: Payer: Self-pay | Admitting: Oncology

## 2022-04-01 VITALS — BP 119/75 | HR 99 | Temp 98.0°F | Resp 18 | Ht 69.0 in | Wt 159.5 lb

## 2022-04-01 DIAGNOSIS — C787 Secondary malignant neoplasm of liver and intrahepatic bile duct: Secondary | ICD-10-CM

## 2022-04-01 DIAGNOSIS — Z5112 Encounter for antineoplastic immunotherapy: Secondary | ICD-10-CM | POA: Diagnosis not present

## 2022-04-01 DIAGNOSIS — C7931 Secondary malignant neoplasm of brain: Secondary | ICD-10-CM

## 2022-04-01 DIAGNOSIS — C439 Malignant melanoma of skin, unspecified: Secondary | ICD-10-CM

## 2022-04-01 DIAGNOSIS — Z79899 Other long term (current) drug therapy: Secondary | ICD-10-CM | POA: Diagnosis not present

## 2022-04-01 DIAGNOSIS — C4361 Malignant melanoma of right upper limb, including shoulder: Secondary | ICD-10-CM | POA: Diagnosis not present

## 2022-04-01 DIAGNOSIS — C7800 Secondary malignant neoplasm of unspecified lung: Secondary | ICD-10-CM

## 2022-04-01 LAB — BASIC METABOLIC PANEL
BUN: 18 (ref 4–21)
CO2: 26 — AB (ref 13–22)
Chloride: 102 (ref 99–108)
Creatinine: 0.8 (ref 0.6–1.3)
Glucose: 119
Potassium: 4.2 mEq/L (ref 3.5–5.1)
Sodium: 134 — AB (ref 137–147)

## 2022-04-01 LAB — CBC: RBC: 4.32 (ref 3.87–5.11)

## 2022-04-01 LAB — HEPATIC FUNCTION PANEL
ALT: 19 U/L (ref 10–40)
AST: 22 (ref 14–40)
Alkaline Phosphatase: 72 (ref 25–125)
Bilirubin, Total: 0.5

## 2022-04-01 LAB — COMPREHENSIVE METABOLIC PANEL
Albumin: 3.9 (ref 3.5–5.0)
Calcium: 9.6 (ref 8.7–10.7)

## 2022-04-01 LAB — CBC AND DIFFERENTIAL
HCT: 40 — AB (ref 41–53)
Hemoglobin: 13.6 (ref 13.5–17.5)
Neutrophils Absolute: 8.53
Platelets: 567 10*3/uL — AB (ref 150–400)
WBC: 9.8

## 2022-04-01 LAB — TSH: TSH: 1.019 u[IU]/mL (ref 0.350–4.500)

## 2022-04-01 MED ORDER — LIDOCAINE-PRILOCAINE 2.5-2.5 % EX CREA: 1.0000 | TOPICAL_CREAM | CUTANEOUS | 0 refills | Status: AC | PRN

## 2022-04-03 ENCOUNTER — Ambulatory Visit: Payer: Medicare Other

## 2022-04-03 ENCOUNTER — Other Ambulatory Visit: Payer: Self-pay

## 2022-04-03 LAB — T4: T4, Total: 7.9 ug/dL (ref 4.5–12.0)

## 2022-04-09 ENCOUNTER — Other Ambulatory Visit: Payer: Medicare Other

## 2022-04-10 ENCOUNTER — Inpatient Hospital Stay: Payer: Medicare Other | Attending: Hematology and Oncology

## 2022-04-10 DIAGNOSIS — C4361 Malignant melanoma of right upper limb, including shoulder: Secondary | ICD-10-CM | POA: Diagnosis not present

## 2022-04-10 DIAGNOSIS — Z5112 Encounter for antineoplastic immunotherapy: Secondary | ICD-10-CM | POA: Diagnosis not present

## 2022-04-10 DIAGNOSIS — D649 Anemia, unspecified: Secondary | ICD-10-CM | POA: Diagnosis not present

## 2022-04-10 DIAGNOSIS — C439 Malignant melanoma of skin, unspecified: Secondary | ICD-10-CM

## 2022-04-10 DIAGNOSIS — Z79899 Other long term (current) drug therapy: Secondary | ICD-10-CM | POA: Insufficient documentation

## 2022-04-10 DIAGNOSIS — C7800 Secondary malignant neoplasm of unspecified lung: Secondary | ICD-10-CM | POA: Insufficient documentation

## 2022-04-10 DIAGNOSIS — C787 Secondary malignant neoplasm of liver and intrahepatic bile duct: Secondary | ICD-10-CM | POA: Diagnosis not present

## 2022-04-10 DIAGNOSIS — C779 Secondary and unspecified malignant neoplasm of lymph node, unspecified: Secondary | ICD-10-CM | POA: Diagnosis not present

## 2022-04-10 DIAGNOSIS — C7931 Secondary malignant neoplasm of brain: Secondary | ICD-10-CM | POA: Diagnosis not present

## 2022-04-10 LAB — COMPREHENSIVE METABOLIC PANEL
Albumin: 4.2 (ref 3.5–5.0)
Calcium: 9.5 (ref 8.7–10.7)

## 2022-04-10 LAB — BASIC METABOLIC PANEL
BUN: 17 (ref 4–21)
CO2: 32 — AB (ref 13–22)
Chloride: 103 (ref 99–108)
Creatinine: 0.9 (ref 0.6–1.3)
Glucose: 134
Potassium: 4.1 mEq/L (ref 3.5–5.1)
Sodium: 140 (ref 137–147)

## 2022-04-10 LAB — TSH: TSH: 1.35 u[IU]/mL (ref 0.350–4.500)

## 2022-04-10 LAB — CBC: RBC: 4.39 (ref 3.87–5.11)

## 2022-04-10 LAB — HEPATIC FUNCTION PANEL
ALT: 17 U/L (ref 10–40)
AST: 26 (ref 14–40)
Alkaline Phosphatase: 69 (ref 25–125)
Bilirubin, Total: 0.8

## 2022-04-10 LAB — CBC AND DIFFERENTIAL
HCT: 41 (ref 41–53)
Hemoglobin: 13.7 (ref 13.5–17.5)
Neutrophils Absolute: 4.38
Platelets: 356 10*3/uL (ref 150–400)
WBC: 7.3

## 2022-04-11 LAB — T4: T4, Total: 9 ug/dL (ref 4.5–12.0)

## 2022-04-11 MED FILL — Nivolumab IV Soln 40 MG/4ML: INTRAVENOUS | Qty: 7 | Status: AC

## 2022-04-11 MED FILL — Ipilimumab Soln for IV Infusion 200 MG/40ML (5 MG/ML): INTRAVENOUS | Qty: 40 | Status: AC

## 2022-04-14 ENCOUNTER — Inpatient Hospital Stay: Payer: Medicare Other

## 2022-04-14 VITALS — BP 137/80 | HR 94 | Temp 98.3°F | Resp 16 | Ht 69.0 in | Wt 158.0 lb

## 2022-04-14 DIAGNOSIS — C4361 Malignant melanoma of right upper limb, including shoulder: Secondary | ICD-10-CM | POA: Diagnosis not present

## 2022-04-14 DIAGNOSIS — C779 Secondary and unspecified malignant neoplasm of lymph node, unspecified: Secondary | ICD-10-CM | POA: Diagnosis not present

## 2022-04-14 DIAGNOSIS — C439 Malignant melanoma of skin, unspecified: Secondary | ICD-10-CM

## 2022-04-14 DIAGNOSIS — Z5112 Encounter for antineoplastic immunotherapy: Secondary | ICD-10-CM | POA: Diagnosis not present

## 2022-04-14 DIAGNOSIS — C7800 Secondary malignant neoplasm of unspecified lung: Secondary | ICD-10-CM | POA: Diagnosis not present

## 2022-04-14 DIAGNOSIS — C787 Secondary malignant neoplasm of liver and intrahepatic bile duct: Secondary | ICD-10-CM

## 2022-04-14 DIAGNOSIS — C7931 Secondary malignant neoplasm of brain: Secondary | ICD-10-CM | POA: Diagnosis not present

## 2022-04-14 MED ORDER — SODIUM CHLORIDE 0.9% FLUSH
10.0000 mL | INTRAVENOUS | Status: DC | PRN
  Administered 2022-04-14: 10 mL

## 2022-04-14 MED ORDER — SODIUM CHLORIDE 0.9 % IV SOLN
1.0000 mg/kg | Freq: Once | INTRAVENOUS | Status: AC
  Administered 2022-04-14: 70 mg via INTRAVENOUS
  Filled 2022-04-14: qty 7

## 2022-04-14 MED ORDER — SODIUM CHLORIDE 0.9 % IV SOLN: Freq: Once | INTRAVENOUS | Status: AC

## 2022-04-14 MED ORDER — SODIUM CHLORIDE 0.9 % IV SOLN
2.8000 mg/kg | Freq: Once | INTRAVENOUS | Status: AC
  Administered 2022-04-14: 200 mg via INTRAVENOUS
  Filled 2022-04-14: qty 40

## 2022-04-14 MED ORDER — FAMOTIDINE IN NACL 20-0.9 MG/50ML-% IV SOLN
20.0000 mg | Freq: Once | INTRAVENOUS | Status: AC
  Administered 2022-04-14: 20 mg via INTRAVENOUS
  Filled 2022-04-14: qty 50

## 2022-04-14 MED ORDER — HEPARIN SOD (PORK) LOCK FLUSH 100 UNIT/ML IV SOLN
500.0000 [IU] | Freq: Once | INTRAVENOUS | Status: AC | PRN
  Administered 2022-04-14: 500 [IU]

## 2022-04-14 MED ORDER — DIPHENHYDRAMINE HCL 50 MG/ML IJ SOLN
25.0000 mg | Freq: Once | INTRAMUSCULAR | Status: AC
  Administered 2022-04-14: 25 mg via INTRAVENOUS
  Filled 2022-04-14: qty 1

## 2022-04-14 NOTE — Progress Notes (Signed)
Ipilimumab (YERVOY) Patient Monitoring Assessment   Is the patient experiencing any of the following general symptoms?:  '[]'$ Patient is not experiencing any of the general symptoms listed in this section.  '[]'$ Difficulty performing normal activities '[]'$ Feeling sluggish or cold all the time '[]'$ Unusual weight gain '[]'$ Constant or unusual headaches '[]'$ Feeling dizzy or faint '[x]'$ Changes in eyesight (blurry vision, double vision, or other vision problems) '[]'$ Changes in mood or behavior (ex: decreased sex drive, irritability, or forgetfulness) '[]'$ Starting new medications (ex: steroids, other medications that lower immune response)   Gastrointestinal  Patient is having 1 bowel movements every three days Is this different from baseline? '[]'$ Yes '[x]'$ No Are your stools watery or do they have a foul smell? '[]'$ Yes '[x]'$ No Have you seen blood in your stools? '[]'$ Yes '[x]'$ No Are your stools dark, tarry, or sticky? '[]'$ Yes '[x]'$ No Are you having pain or tenderness in your belly? '[]'$ Yes '[x]'$ No  Skin Does your skin itch? '[x]'$ Yes '[]'$ No Do you have a rash? '[]'$ Yes '[x]'$ No Has your skin blistered and/or peeled? '[]'$ Yes '[x]'$ No Do you have sores in your mouth? '[]'$ Yes '[x]'$ No  Hepatic Has your urine been dark or tea colored? '[]'$ Yes '[x]'$ No Have you noticed your skin or the whites of your eyes are turning yellow? '[]'$ Yes '[x]'$ No Are you bleeding or bruising more easily than normal? '[]'$ Yes '[x]'$ No Are you nauseous and/or vomiting? '[]'$ Yes '[x]'$ No Do you have pain on the right side of your stomach? '[]'$ Yes '[x]'$ No  Neurologic  Are you having unusual weakness of legs, arms, or face? '[]'$ Yes '[x]'$ No Are you having numbness or tingling in your hands or feet? '[]'$ Yes '[x]'$ No  Xavier Jones

## 2022-04-14 NOTE — Patient Instructions (Signed)
Ipilimumab Injection What is this medication? IPILIMUMAB (IP i LIM ue mab) treats some types of cancer. It works by helping your immune system slow or stop the spread of cancer cells. It is a monoclonal antibody. This medicine may be used for other purposes; ask your health care provider or pharmacist if you have questions. COMMON BRAND NAME(S): YERVOY What should I tell my care team before I take this medication? They need to know if you have any of these conditions: Allogeneic stem cell transplant (uses someone else's stem cells) Autoimmune diseases, such as Crohn disease, ulcerative colitis, lupus Nervous system problems, such as Guillain-Barre syndrome or myasthenia gravis Organ transplant An unusual or allergic reaction to ipilimumab, other medications, foods, dyes, or preservatives Pregnant or trying to get pregnant Breast-feeding How should I use this medication? This medication is infused into a vein. It is given by your care team in a hospital or clinic setting. A special MedGuide will be given to you before each treatment. Be sure to read this information carefully each time. Talk to your care team about the use of this medication in children. While it may be prescribed for children as young as 12 years for selected conditions, precautions do apply. Overdosage: If you think you have taken too much of this medicine contact a poison control center or emergency room at once. NOTE: This medicine is only for you. Do not share this medicine with others. What if I miss a dose? Keep appointments for follow-up doses. It is important not to miss your dose. Call your care team if you are unable to keep an appointment. What may interact with this medication? Interactions are not expected. This list may not describe all possible interactions. Give your health care provider a list of all the medicines, herbs, non-prescription drugs, or dietary supplements you use. Also tell them if you smoke,  drink alcohol, or use illegal drugs. Some items may interact with your medicine. What should I watch for while using this medication? Your condition will be monitored carefully while you are receiving this medication. You may need blood work while taking this medication. This medication may cause serious skin reactions. They can happen weeks to months after starting the medication. Contact your care team right away if you notice fevers or flu-like symptoms with a rash. The rash may be red or purple and then turn into blisters or peeling of the skin. You may also notice a red rash with swelling of the face, lips, or lymph nodes in your neck or under your arms. Tell your care team right away if you have any change in your eyesight. Talk to your care team if you may be pregnant. Serious birth defects can occur if you take this medication during pregnancy and for 3 months after the last dose. You will need a negative pregnancy test before starting this medication. Contraception is recommended while taking this medication and for 3 months after the last dose. Your care team can help you find the option that works for you. Do not breastfeed while taking this medication and for 3 months after the last dose. What side effects may I notice from receiving this medication? Side effects that you should report to your care team as soon as possible: Allergic reactions--skin rash, itching, hives, swelling of the face, lips, tongue, or throat Dry cough, shortness of breath or trouble breathing Eye pain, redness, irritation, or discharge with blurry or decreased vision Heart muscle inflammation--unusual weakness or fatigue, shortness of breath,   chest pain, fast or irregular heartbeat, dizziness, swelling of the ankles, feet, or hands Hormone gland problems--headache, sensitivity to light, unusual weakness or fatigue, dizziness, fast or irregular heartbeat, increased sensitivity to cold or heat, excessive sweating,  constipation, hair loss, increased thirst or amount of urine, tremors or shaking, irritability Infusion reactions--chest pain, shortness of breath or trouble breathing, feeling faint or lightheaded Kidney injury (glomerulonephritis)--decrease in the amount of urine, red or dark brown urine, foamy or bubbly urine, swelling of the ankles, hands, or feet Liver injury--right upper belly pain, loss of appetite, nausea, light-colored stool, dark yellow or brown urine, yellowing skin or eyes, unusual weakness or fatigue Pain, tingling, or numbness in the hands or feet, muscle weakness, change in vision, confusion or trouble speaking, loss of balance or coordination, trouble walking, seizures Rash, fever, and swollen lymph nodes Redness, blistering, peeling, or loosening of the skin, including inside the mouth Sudden or severe stomach pain, bloody diarrhea, fever, nausea, vomiting Side effects that usually do not require medical attention (report to your care team if they continue or are bothersome): Bone, joint, or muscle pain Diarrhea Fatigue Loss of appetite Nausea Skin rash This list may not describe all possible side effects. Call your doctor for medical advice about side effects. You may report side effects to FDA at 1-800-FDA-1088. Where should I keep my medication? This medication is given in a hospital or clinic. It will not be stored at home. NOTE: This sheet is a summary. It may not cover all possible information. If you have questions about this medicine, talk to your doctor, pharmacist, or health care provider.  2023 [employer] Standard (2021-11-05 00:00:00) Nivolumab Injection What is this medication? NIVOLUMAB (nye VOL ue mab) treats some types of cancer. It works by helping your immune system slow or stop the spread of cancer cells. It is a monoclonal antibody. This medicine may be used for other purposes; ask your health care provider or pharmacist if you have questions. COMMON  BRAND NAME(S): Opdivo What should I tell my care team before I take this medication? They need to know if you have any of these conditions: Allogeneic stem cell transplant (uses someone else's stem cells) Autoimmune diseases, such as Crohn disease, ulcerative colitis, lupus History of chest radiation Nervous system problems, such as Guillain-Barre syndrome or myasthenia gravis Organ transplant An unusual or allergic reaction to nivolumab, other medications, foods, dyes, or preservatives Pregnant or trying to get pregnant Breast-feeding How should I use this medication? This medication is infused into a vein. It is given in a hospital or clinic setting. A special MedGuide will be given to you before each treatment. Be sure to read this information carefully each time. Talk to your care team about the use of this medication in children. While it may be prescribed for children as young as 12 years for selected conditions, precautions do apply. Overdosage: If you think you have taken too much of this medicine contact a poison control center or emergency room at once. NOTE: This medicine is only for you. Do not share this medicine with others. What if I miss a dose? Keep appointments for follow-up doses. It is important not to miss your dose. Call your care team if you are unable to keep an appointment. What may interact with this medication? Interactions have not been studied. This list may not describe all possible interactions. Give your health care provider a list of all the medicines, herbs, non-prescription drugs, or dietary supplements you use. Also tell   them if you smoke, drink alcohol, or use illegal drugs. Some items may interact with your medicine. What should I watch for while using this medication? Your condition will be monitored carefully while you are receiving this medication. You may need blood work while taking this medication. This medication may cause serious skin reactions.  They can happen weeks to months after starting the medication. Contact your care team right away if you notice fevers or flu-like symptoms with a rash. The rash may be red or purple and then turn into blisters or peeling of the skin. You may also notice a red rash with swelling of the face, lips, or lymph nodes in your neck or under your arms. Tell your care team right away if you have any change in your eyesight. Talk to your care team if you are pregnant or think you might be pregnant. A negative pregnancy test is required before starting this medication. A reliable form of contraception is recommended while taking this medication and for 5 months after the last dose. Talk to your care team about effective forms of contraception. Do not breast-feed while taking this medication and for 5 months after the last dose. What side effects may I notice from receiving this medication? Side effects that you should report to your care team as soon as possible: Allergic reactions--skin rash, itching, hives, swelling of the face, lips, tongue, or throat Dry cough, shortness of breath or trouble breathing Eye pain, redness, irritation, or discharge with blurry or decreased vision Heart muscle inflammation--unusual weakness or fatigue, shortness of breath, chest pain, fast or irregular heartbeat, dizziness, swelling of the ankles, feet, or hands Hormone gland problems--headache, sensitivity to light, unusual weakness or fatigue, dizziness, fast or irregular heartbeat, increased sensitivity to cold or heat, excessive sweating, constipation, hair loss, increased thirst or amount of urine, tremors or shaking, irritability Infusion reactions--chest pain, shortness of breath or trouble breathing, feeling faint or lightheaded Kidney injury (glomerulonephritis)--decrease in the amount of urine, red or dark brown urine, foamy or bubbly urine, swelling of the ankles, hands, or feet Liver injury--right upper belly pain, loss  of appetite, nausea, light-colored stool, dark yellow or brown urine, yellowing skin or eyes, unusual weakness or fatigue Pain, tingling, or numbness in the hands or feet, muscle weakness, change in vision, confusion or trouble speaking, loss of balance or coordination, trouble walking, seizures Rash, fever, and swollen lymph nodes Redness, blistering, peeling, or loosening of the skin, including inside the mouth Sudden or severe stomach pain, bloody diarrhea, fever, nausea, vomiting Side effects that usually do not require medical attention (report these to your care team if they continue or are bothersome): Bone, joint, or muscle pain Diarrhea Fatigue Loss of appetite Nausea Skin rash This list may not describe all possible side effects. Call your doctor for medical advice about side effects. You may report side effects to FDA at 1-800-FDA-1088. Where should I keep my medication? This medication is given in a hospital or clinic. It will not be stored at home. NOTE: This sheet is a summary. It may not cover all possible information. If you have questions about this medicine, talk to your doctor, pharmacist, or health care provider.  2023 [employer] Standard (2021-05-24 00:00:00)  

## 2022-04-15 ENCOUNTER — Ambulatory Visit: Payer: 59 | Admitting: Internal Medicine

## 2022-04-15 DIAGNOSIS — Z135 Encounter for screening for eye and ear disorders: Secondary | ICD-10-CM | POA: Diagnosis not present

## 2022-04-15 DIAGNOSIS — C7931 Secondary malignant neoplasm of brain: Secondary | ICD-10-CM | POA: Diagnosis not present

## 2022-04-15 DIAGNOSIS — G9389 Other specified disorders of brain: Secondary | ICD-10-CM | POA: Diagnosis not present

## 2022-04-17 ENCOUNTER — Ambulatory Visit: Payer: Self-pay | Admitting: Urology

## 2022-04-18 ENCOUNTER — Encounter: Payer: Self-pay | Admitting: Oncology

## 2022-04-18 ENCOUNTER — Inpatient Hospital Stay: Payer: Medicare Other

## 2022-04-18 ENCOUNTER — Inpatient Hospital Stay (INDEPENDENT_AMBULATORY_CARE_PROVIDER_SITE_OTHER): Payer: Medicare Other | Admitting: Oncology

## 2022-04-18 ENCOUNTER — Other Ambulatory Visit: Payer: Self-pay | Admitting: Oncology

## 2022-04-18 VITALS — BP 128/84 | HR 104 | Temp 97.9°F | Resp 19 | Ht 69.0 in | Wt 159.7 lb

## 2022-04-18 DIAGNOSIS — Z5112 Encounter for antineoplastic immunotherapy: Secondary | ICD-10-CM | POA: Diagnosis not present

## 2022-04-18 DIAGNOSIS — C7800 Secondary malignant neoplasm of unspecified lung: Secondary | ICD-10-CM

## 2022-04-18 DIAGNOSIS — C787 Secondary malignant neoplasm of liver and intrahepatic bile duct: Secondary | ICD-10-CM | POA: Diagnosis not present

## 2022-04-18 DIAGNOSIS — C4361 Malignant melanoma of right upper limb, including shoulder: Secondary | ICD-10-CM | POA: Diagnosis not present

## 2022-04-18 DIAGNOSIS — C439 Malignant melanoma of skin, unspecified: Secondary | ICD-10-CM | POA: Diagnosis not present

## 2022-04-18 DIAGNOSIS — C7931 Secondary malignant neoplasm of brain: Secondary | ICD-10-CM | POA: Diagnosis not present

## 2022-04-18 DIAGNOSIS — D649 Anemia, unspecified: Secondary | ICD-10-CM | POA: Diagnosis not present

## 2022-04-18 DIAGNOSIS — C779 Secondary and unspecified malignant neoplasm of lymph node, unspecified: Secondary | ICD-10-CM | POA: Diagnosis not present

## 2022-04-18 LAB — TSH: TSH: 1.063 u[IU]/mL (ref 0.350–4.500)

## 2022-04-18 NOTE — Progress Notes (Unsigned)
Patient Care Team: Earlyne Iba, NP as PCP - General (Nurse Practitioner) Derwood Kaplan, MD as Consulting Physician (Oncology) Gatha Mayer, MD as Consulting Physician (Radiation Oncology)  Clinic Day: 04/01/2022  Referring physician: Earlyne Iba, NP  ASSESSMENT & PLAN:   Assessment & Plan: Melanoma of skin ([place]) Stage IIB malignant melanoma of the right posterior shoulder, Breslow's depth 3.3 mm, Clark level IV, treated with wide local excision, diagnosed in May 2021.  There was still a question as to whether this represented a metastatic lesion, rather than a primary, based on the presence of 2 nodules and no epidermal involvement.  MRI head and CT chest, abdomen and pelvis in June were negative for metastatic disease.   No adjuvant therapy was recommended.  PET scan in September was negative for metastatic disease. Chest x-ray in June 2022 was negative.   Metastasis to subcutaneous tissue  He presented with a new area of concern to the left mid back in June of this year. A superficial nodule is noted approximately 1 cm to the right of his upper thoracic spine. He cannot fully assess the area due to location, but feels like it has increased in size. There is no redness or warmth noted to the area.  He was sent for biopsy and that was performed on June 13 and confirmed metastatic malignant melanoma.  Melan-A was positive S100 was positive SOX10 was positive and PRAME positive.  We will order BRAF testing and a melanoma panel through [facility].  He has several hypermetabolic subcutaneous nodules seen.  Metastasis to liver He has a small hypermetabolic liver lesion which is new and less than 1 cm but suspicious for metastasis.  Metastasis to lymph nodes He has multiple hypermetabolic nodes of the retroperitoneal and right pelvic area as well as right external iliac and a few additional nodes.  Pulmonary metastases He has bilateral hypermetabolic pulmonary nodules which are  increased in size compared with the CT chest performed in May of this year.  The largest nodule is up to 2.3 cm in diameter.  Metastases to brain This was suspicious on the PET scan in the left frontoparietal lobe and so he had an MRI of the brain.  This confirmed a 2.6 cm heterogeneous lesion of the left superior temporal gyrus with mild enhancement and extensive surrounding edema.  He also has an 8 mm cortical lesion of the inferomedial right frontal lobe with mild edema, a 2 mm mildly enhancing cortical lesion of the inferolateral left frontal lobe and a intrinsically T1 hyperintense lesion of the left lateral pons measuring 7 mm with surrounding edema.  He has 4 lesions in total but 3 are less than 1 cm.  He was on dexamethasone but tapered off, and has received stereotactic radiation. He had neurologic symptoms in September and was placed on dexamethasone. Scans did show some cerebral edema so he will be slowly tapered off.    Multiple dysplastic nevi Multiple dysplastic nevi of the right lower back with severe atypia.  The nevus of the medial back had positive peripheral margins, so he underwent re-excision of this area as well, which only revealed dermal scar. Dysplastic compound nevus of the left mid buttock with moderate atypia.  Margins were free of neoplasm.    Family history of breast cancer Maternal family history of breast cancer in addition to his melanoma history which raises suspicion for possible BRCA mutation.  He met with the genetic counselors and [facility] RNA + hereditary cancer panel revealed  variants of uncertain significance (VUS) of the APC, CDKN1C and FH genes.     We will plan to see him back in 4 weeks with CBC and CMP for repeat evaluation before his fourth cycle of immunotherapy 2 days after. He will resume immunotherapy on October 9th, with labs a few days before. He will then go onto maintenance Nivolumab, every 2 weeks at first and then every 4 weeks. The patient  understands the plans discussed today and is in agreement with them.  He knows to contact our office if she develops concerns prior to her next appointment. I have answered his questions and reviewed this information in detail.The patient understands the plans discussed today and is in agreement with them.  He knows to contact our office if he develops concerns prior to his next .    Derwood Kaplan, MD  [facility] [address] [place] [address] Dept: [phone] Dept Fax: [phone]   No orders of the defined types were placed in this encounter.     CHIEF COMPLAINT:  CC: Metastatic melanoma  Current Treatment: Corticosteroids and brain irradiation, followed by immunotherapy  INTERVAL HISTORY:  Xavier Jones is here today for repeat clinical assessment.  His 3rd dose of immunotherapy was about 3 weeks ago, he will be getting his 4th dose of immunotherapy the second week of October. He states he was having trouble with his face describing it as loss of muscle control. His most recent MRI scan reveled some swelling. He was placed on steroids but was improving prior to it. He was prescribed 1 mg of dexamethasone 3 times a day and will decrease by 1 mg each week. He states he is still working full time. As for treatment, he states he is doing fair. He also states he had bronchitis which is resolving.  His appetite is fair. He has lost 1 pound since his last visit. He denies pain. CBC and CMP are normal. Dr.Vaslow is involved in his case.   I have reviewed the past medical history, past surgical history, social history and family history with the patient and they are unchanged from previous note.  ALLERGIES:  is allergic to testosterone and sulfa antibiotics.  MEDICATIONS:  Current Outpatient Medications  Medication Sig Dispense Refill   acyclovir (ZOVIRAX) 200 MG capsule Take by mouth.     amitriptyline (ELAVIL) 25 MG  tablet Take 50 mg by mouth at bedtime.     dexamethasone (DECADRON) 1 MG tablet Take 3 tablets (3 mg total) by mouth daily with breakfast for 7 days, THEN 2 tablets (2 mg total) daily with breakfast for 7 days, THEN 1 tablet (1 mg total) daily with breakfast for 7 days. 42 tablet 0   dexamethasone (DECADRON) 4 MG tablet Take by mouth.     ergocalciferol (VITAMIN D2) 1.25 MG (50000 UT) capsule Take 50,000 Units by mouth every Wednesday.     hydrOXYzine (ATARAX) 25 MG tablet Take 1 tablet (25 mg total) by mouth 3 (three) times daily as needed for itching. (Patient not taking: Reported on 03/31/2022) 60 tablet 3   levofloxacin (LEVAQUIN) 500 MG tablet Take 1 tablet (500 mg total) by mouth daily. 10 tablet 0   lidocaine-prilocaine (EMLA) cream Apply 1 Application topically as needed. 30 g 0   ondansetron (ZOFRAN) 4 MG tablet Take 1 tablet (4 mg total) by mouth every 4 (four) hours as needed for nausea. (Patient not taking: Reported on 03/31/2022) 90  tablet 3   prochlorperazine (COMPAZINE) 10 MG tablet Take 1 tablet (10 mg total) by mouth every 6 (six) hours as needed for nausea or vomiting. (Patient not taking: Reported on 03/31/2022) 90 tablet 3   Rivaroxaban (XARELTO) 15 MG TABS tablet Take 1 tablet (15 mg total) by mouth 2 (two) times daily with a meal. For 21 days, then switch to 20 mg daily 60 tablet 0   rivaroxaban (XARELTO) 20 MG TABS tablet Take 1 tablet (20 mg total) by mouth daily with supper. Start after completing 3 weeks of Xarelto 15 mg twice daily (Patient not taking: Reported on 03/31/2022) 30 tablet 3   No current facility-administered medications for this visit.    HISTORY OF PRESENT ILLNESS:   Oncology History  Melanoma of skin ([place])  12/01/2019 Initial Diagnosis   Melanoma of skin ([place])   12/05/2019 Cancer Staging   Staging form: Melanoma of the Skin, AJCC 8th Edition - Clinical stage from 12/05/2019: Stage IIA (cT3a, cN0, cM0) - Signed by Derwood Kaplan, MD on 05/30/2020    02/18/2021 Genetic Testing   No pathogenic variants detected in [facility] Multi-Cancer +RNA Panel.  Variants of uncertain significance detected in APC (c.681C>G (p.Asp227Glu)), CDKN1C (c.610C>G (p.Pro204Ala)), and FH (c.1151C>T (p.Ala384Val)).  The report date is February 18, 2021.    The Multi-Cancer + RNA Panel offered by [facility] includes sequencing and/or deletion/duplication analysis of the following 84 genes:  AIP*, ALK, APC*, ATM*, AXIN2*, BAP1*, BARD1*, BLM*, BMPR1A*, BRCA1*, BRCA2*, BRIP1*, CASR, CDC73*, CDH1*, CDK4, CDKN1B*, CDKN1C*, CDKN2A, CEBPA, CHEK2*, CTNNA1*, DICER1*, DIS3L2*, EGFR, EPCAM, FH*, FLCN*, GATA2*, GPC3, GREM1, HOXB13, HRAS, KIT, MAX*, MEN1*, MET, MITF, MLH1*, MSH2*, MSH3*, MSH6*, MUTYH*, NBN*, NF1*, NF2*, NTHL1*, PALB2*, PDGFRA, PHOX2B, PMS2*, POLD1*, POLE*, POT1*, PRKAR1A*, PTCH1*, PTEN*, RAD50*, RAD51C*, RAD51D*, RB1*, RECQL4, RET, RUNX1*, SDHA*, SDHAF2*, SDHB*, SDHC*, SDHD*, SMAD4*, SMARCA4*, SMARCB1*, SMARCE1*, STK11*, SUFU*, TERC, TERT, TMEM127*, Tp53*, TSC1*, TSC2*, VHL*, WRN*, and WT1.  RNA analysis is performed for * genes.   01/30/2022 - 02/20/2022 Chemotherapy   Patient is on Treatment Plan : MELANOMA Nivolumab + Ipilimumab (1/3) q21d / Nivolumab q28d     01/30/2022 -  Chemotherapy   Patient is on Treatment Plan : MELANOMA Nivolumab (1) + Ipilimumab (3) q21d / Nivolumab (480) q28d     Malignant melanoma metastatic to brain ([place])  12/19/2021 Initial Diagnosis   Malignant melanoma metastatic to brain ([place])   01/30/2022 - 02/20/2022 Chemotherapy   Patient is on Treatment Plan : MELANOMA Nivolumab + Ipilimumab (1/3) q21d / Nivolumab q28d     01/30/2022 -  Chemotherapy   Patient is on Treatment Plan : MELANOMA Nivolumab (1) + Ipilimumab (3) q21d / Nivolumab (480) q28d     Malignant melanoma metastatic to lung ([place])  12/19/2021 Initial Diagnosis   Malignant melanoma metastatic to lung ([place])   01/30/2022 - 02/20/2022 Chemotherapy   Patient is on Treatment Plan : MELANOMA  Nivolumab + Ipilimumab (1/3) q21d / Nivolumab q28d     01/30/2022 -  Chemotherapy   Patient is on Treatment Plan : MELANOMA Nivolumab (1) + Ipilimumab (3) q21d / Nivolumab (480) q28d     Malignant neoplasm metastatic to liver ([place])  12/19/2021 Initial Diagnosis   Malignant neoplasm metastatic to liver ([place])   01/30/2022 - 02/20/2022 Chemotherapy   Patient is on Treatment Plan : MELANOMA Nivolumab + Ipilimumab (1/3) q21d / Nivolumab q28d     01/30/2022 -  Chemotherapy   Patient is on Treatment Plan : MELANOMA Nivolumab (1) + Ipilimumab (3) q21d /  Nivolumab (480) q28d         REVIEW OF SYSTEMS:   Constitutional: Denies fevers, chills or abnormal weight loss Eyes: Denies blurriness of vision Ears, nose, mouth, throat, and face: Denies mucositis or sore throat Respiratory: Denies cough, dyspnea or wheezes Cardiovascular: Denies palpitation, chest discomfort or lower extremity swelling Gastrointestinal:  Denies nausea, heartburn or change in bowel habits Skin: Denies abnormal skin rashes Lymphatics: Denies new lymphadenopathy or easy bruising Neurological:Denies numbness, tingling or new weaknesses Behavioral/Psych: Mood is stable, no new changes  All other systems were reviewed with the patient and are negative.   VITALS:  There were no vitals taken for this visit.  Wt Readings from Last 3 Encounters:  04/14/22 158 lb (71.7 kg)  04/01/22 159 lb 8 oz (72.3 kg)  03/31/22 160 lb 8 oz (72.8 kg)    There is no height or weight on file to calculate BMI.  Performance status (ECOG): 1 - Symptomatic but completely ambulatory  PHYSICAL EXAM:   GENERAL:alert, no distress and comfortable SKIN: skin color, texture, turgor are normal, no rashes or significant lesions.  He has an additional nodule of the left flank which is smaller but firm. EYES: normal, Conjunctiva are pink and non-injected, sclera clear OROPHARYNX:no exudate, no erythema and lips, buccal mucosa, and tongue normal  NECK:  supple, thyroid normal size, non-tender, without nodularity LYMPH:  no palpable lymphadenopathy in the cervical, axillary or inguinal LUNGS: clear to auscultation and percussion with normal breathing effort Occasional wheeze all lung feels except upper lobe HEART: regular rate & rhythm with tachycardia, and no lower extremity edema ABDOMEN:abdomen soft, non-tender and normal bowel sounds Musculoskeletal:no cyanosis of digits and no clubbing  NEURO: alert & oriented x 3 with fluent speech, no focal motor/sensory deficits Port colon right upper chest maculopapular rash surrounding superior portion. Looks like allergic rash  Large scars bilateral lower back, posteriorly. Mass in upper thoracic posterior area measuring 4 x 4 cm ,  firm   LABORATORY DATA:  I have reviewed the data as listed    Component Value Date/Time   NA 140 04/10/2022 0000   K 4.1 04/10/2022 0000   CL 103 04/10/2022 0000   CO2 32 (A) 04/10/2022 0000   BUN 17 04/10/2022 0000   CREATININE 0.9 04/10/2022 0000   CALCIUM 9.5 04/10/2022 0000   ALBUMIN 4.2 04/10/2022 0000   AST 26 04/10/2022 0000   ALT 17 04/10/2022 0000   ALKPHOS 69 04/10/2022 0000    No results found for: "SPEP", "UPEP"  Lab Results  Component Value Date   WBC 7.3 04/10/2022   NEUTROABS 4.38 04/10/2022   HGB 13.7 04/10/2022   HCT 41 04/10/2022   MCV 93 03/11/2022   PLT 356 04/10/2022      Chemistry      Component Value Date/Time   NA 140 04/10/2022 0000   K 4.1 04/10/2022 0000   CL 103 04/10/2022 0000   CO2 32 (A) 04/10/2022 0000   BUN 17 04/10/2022 0000   CREATININE 0.9 04/10/2022 0000   GLU 134 04/10/2022 0000      Component Value Date/Time   CALCIUM 9.5 04/10/2022 0000   ALKPHOS 69 04/10/2022 0000   AST 26 04/10/2022 0000   ALT 17 04/10/2022 0000       RADIOGRAPHIC STUDIES: I have personally reviewed the radiological images as listed and agreed with the findings in the report. MR BRAIN W WO CONTRAST  Result Date:  03/24/2022 CLINICAL DATA:  Brain metastases, assess treatment  response; metastatic melanoma EXAM: MRI HEAD WITHOUT AND WITH CONTRAST TECHNIQUE: Multiplanar, multiecho pulse sequences of the brain and surrounding structures were obtained without and with intravenous contrast. CONTRAST:  7.1 mL [employer] COMPARISON:  01/04/2022 FINDINGS: Brain: Status post left pterional craniotomy and subjacent resection cavity, which demonstrates some intrinsic T1 hyperintense signal along the margin (series 600, image 178) and some thin linear enhancement (series 1100, image 187). Additional small areas of somewhat more nodular enhancement (series 1100, image 184). Improved associated edema. Heterogeneous fluid in the resection cavity. Right inferior frontal gyrus enhancing lesion measures 15 x 11 x 10 mm (series 1100, image 170 and series 10, image 20), previously 10 x 9 x 9 mm when remeasured similarly. Increased associated edema with 5 mm right-to-left midline shift at the level of the inferior frontal lobes, previously 3 mm. Previously noted left inferior frontal gyrus lesion no longer demonstrates enhancement. Lesion in the left aspect of the pons near the 7/8 cranial nerve root entry zone measures 11 x 8 x 8 mm (series 1100, image 136 and series 10, image 23), previously 11 x 10 x 9 mm. Stable to slightly decreased associated edema. No definite new lesion. Small focus of enhancement in the left parietal lobe (series 1100, image 230) is not associated with any edema and may be vascular. No evidence of acute infarct, hemorrhage, mass effect, or midline shift. No hydrocephalus. Vascular: Normal arterial flow voids. Skull and upper cervical spine: Left pterional craniotomy. Otherwise normal marrow signal. Sinuses/Orbits: No acute finding. Other: The mastoids are well aerated. IMPRESSION: 1. Left temporal resection cavity demonstrates thin linear enhancement along the resection margins, which may be postoperative, with additional  small areas of somewhat more nodular enhancement, which are nonspecific but could indicate recurrent disease. Attention on follow-up. 2. Interval increase in the size of the right inferior frontal gyrus enhancing lesion with increased surrounding edema, mild local mass effect, and mild local midline shift. 3. Stable to slightly decreased size of the left pontine lesion with decreased associated edema. 4. Previously noted left inferior frontal gyrus lesion is no longer visualized. 5. No definite new lesion. Electronically Signed   By: Merilyn Baba M.D.   On: 03/24/2022 11:38   EXAM : 01/04/2022 MRI BRAIN W WO CONTRAST  FINDINGS: Brain: New left side craniotomy. Smooth new widespread left hemisphere pachymeningeal thickening is probably postoperative in nature (series 11, image 17). Posterior left temporal/operculum resection cavity with heterogeneous internal contents, both T1 intrinsic and isointense. Some evidence of marginal diffusion restriction (series 2, image 27). No definite enhancement following contrast. Regional vasogenic edema and mass effect are mildly reduced.   T1 intrinsic left brainstem metastasis near the 7th/8th cranial nerve root entry zone is stable at 10 mm. Regional brainstem and left cerebellar peduncle edema does appear mildly increased on series 6, image 9.   Right inferior frontal gyrus T1 hypointense and enhancing metastasis is stable measuring 10 mm (series 10, image 41). Stable mild regional edema.   Nearby small contralateral left inferior frontal gyrus metastasis measuring 3-4 mm is stable on series 10, image 41, no edema.   No new brain metastasis identified.   Stable ventricle size and configuration. No other restricted diffusion. Cervicomedullary junction and pituitary are within normal limits. Normal basilar cisterns. Occasional small nonspecific cerebral white matter T2 and FLAIR hyperintense foci are stable.   Vascular: Major intracranial vascular  flow voids are stable. The major dural venous sinuses are enhancing and appear to be patent.   Skull and  upper cervical spine: Craniotomy. Negative visible cervical spine and spinal cord. Visualized bone marrow signal is within normal limits.   Sinuses/Orbits: Stable and negative.   Other: Mastoids are clear. Visible internal auditory structures appear normal. Postoperative changes to the left scalp.   IMPRESSION: 1. New left side craniotomy and gross total resection of left temporal/operculum metastasis. Mild resection margin ischemia is possible. Stable regional edema and mass effect. Mild smooth left hemisphere meningeal thickening is likely postoperative in nature.   2. The other three smaller brain metastases are stable, aside from mildly increased associated brainstem and left cerebellar peduncle edema.   3. No new brain metastasis identified.     EXAM: 12/27/2021 MRI BRAIN W WO CONTRAST  FINDINGS: Brain: There are 4 unchanged intraparenchymal masses:   1. Left temporal lobe, 3.0 cm series 11, image 85 2. Left frontal lobe 3 mm, image 79 3. Paramedian right frontal lobe 8 mm, image 75 4. Left middle cerebellar peduncle, 9 mm, image 41.   Edema surrounding the left temporal lesion is unchanged with regional mass effect but no midline shift. Evidence of chronic hemorrhage within the left temporal and left cerebellar peduncular lesion. No new lesions. No acute infarct. No extra-axial collection.   Vascular: Normal flow voids.   Skull and upper cervical spine: Normal marrow signal.   Sinuses/Orbits: Negative.   Other: None   IMPRESSION: Unchanged size and appearance of 4 intraparenchymal metastases. No new lesions.     I,Gabriella Ballesteros,acting as a scribe for Derwood Kaplan, MD.,have documented all relevant documentation on the behalf of Derwood Kaplan, MD,as directed by  Derwood Kaplan, MD while in the presence of Derwood Kaplan,  MD.

## 2022-04-19 LAB — T4: T4, Total: 7.9 ug/dL (ref 4.5–12.0)

## 2022-05-01 ENCOUNTER — Other Ambulatory Visit: Payer: Self-pay

## 2022-05-01 ENCOUNTER — Inpatient Hospital Stay: Payer: Medicare Other

## 2022-05-01 ENCOUNTER — Encounter: Payer: Self-pay | Admitting: Hematology and Oncology

## 2022-05-01 ENCOUNTER — Inpatient Hospital Stay (INDEPENDENT_AMBULATORY_CARE_PROVIDER_SITE_OTHER): Payer: Medicare Other | Admitting: Hematology and Oncology

## 2022-05-01 VITALS — BP 124/84 | HR 129 | Temp 98.7°F | Resp 20 | Ht 69.0 in | Wt 148.8 lb

## 2022-05-01 DIAGNOSIS — C7931 Secondary malignant neoplasm of brain: Secondary | ICD-10-CM

## 2022-05-01 DIAGNOSIS — C787 Secondary malignant neoplasm of liver and intrahepatic bile duct: Secondary | ICD-10-CM

## 2022-05-01 DIAGNOSIS — C439 Malignant melanoma of skin, unspecified: Secondary | ICD-10-CM | POA: Diagnosis not present

## 2022-05-01 DIAGNOSIS — I2699 Other pulmonary embolism without acute cor pulmonale: Secondary | ICD-10-CM | POA: Diagnosis not present

## 2022-05-01 DIAGNOSIS — C7800 Secondary malignant neoplasm of unspecified lung: Secondary | ICD-10-CM | POA: Diagnosis not present

## 2022-05-01 DIAGNOSIS — R197 Diarrhea, unspecified: Secondary | ICD-10-CM

## 2022-05-01 DIAGNOSIS — K523 Indeterminate colitis: Secondary | ICD-10-CM

## 2022-05-01 HISTORY — DX: Other pulmonary embolism without acute cor pulmonale: I26.99

## 2022-05-01 HISTORY — DX: Indeterminate colitis: K52.3

## 2022-05-01 LAB — COMPREHENSIVE METABOLIC PANEL
Albumin: 3.4 — AB (ref 3.5–5.0)
Calcium: 8.8 (ref 8.7–10.7)

## 2022-05-01 LAB — CBC AND DIFFERENTIAL
HCT: 47 (ref 41–53)
Hemoglobin: 15.7 (ref 13.5–17.5)
MCV: 90 (ref 80–94)
Neutrophils Absolute: 11.04
Platelets: 456 10*3/uL — AB (ref 150–400)
WBC: 16

## 2022-05-01 LAB — CORRECTED CALCIUM (CC13): Calcium, Corrected: 9.4

## 2022-05-01 LAB — BASIC METABOLIC PANEL
BUN: 16 (ref 4–21)
CO2: 24 — AB (ref 13–22)
Chloride: 94 — AB (ref 99–108)
Creatinine: 1.2 (ref 0.6–1.3)
Glucose: 118
Potassium: 4.1 mEq/L (ref 3.5–5.1)
Sodium: 132 — AB (ref 137–147)

## 2022-05-01 LAB — HEPATIC FUNCTION PANEL
ALT: 17 U/L (ref 10–40)
AST: 25 (ref 14–40)
Alkaline Phosphatase: 76 (ref 25–125)
Bilirubin, Total: 0.5

## 2022-05-01 LAB — CBC: RBC: 5.19 — AB (ref 3.87–5.11)

## 2022-05-01 NOTE — Assessment & Plan Note (Addendum)
Bilateral hypermetabolic pulmonary nodules on PET in June, the largest measuring 2.3 cm,  hich had increased in size compared with the CT chest performed in May.  He was therefore treated with immunotherapy.  CTA in September revealed pulmonary embolism and changes consistent with bronchitis.  There was a decrease in the bilateral pulmonary nodules consistent with a response to therapy. There was interval increase in hilar and mediastinal lymph nodes, but this is commonly seen with immunotherapy.  He was placed on anticoagulation and treated for bronchitis.  He now reports intermittent dry cough without shortness of breath or chest pain.  He completed 4 cycles ipilimumab/nivolumab and was to start maintenance nivolumab next week.  We need to evaluate his diarrhea prior to proceeding.

## 2022-05-01 NOTE — Progress Notes (Addendum)
[place]  [place],  [place]  [id number] [phone]  Addendum: The patient did not have evidence of infectious diarrhea.  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  I advised him we will hold immunotherapy and start him on prednisone 60 mg daily.  I discussed common short-term side effects of high-dose corticosteroids including irritability, insomnia, mood changes, irritation of the stomach, high blood sugar, and high blood pressure.  He will get started on that today.  He will continue to use Pepto-Bismol for the diarrhea, as this has helped somewhat.  He knows to contact us if his diarrhea does not improve by Monday.  I scheduled him for labs and follow-up in 1 week.  Clinic Day:  05/01/2022  Referring physician: Earlyne Iba, NP  ASSESSMENT & PLAN:   Assessment & Plan: Malignant melanoma metastatic to brain [facility]) Stage IIB malignant melanoma of the right posterior shoulder with recurrence in the mid back in June of this year.  Biopsy revealed metastatic malignant melanoma.  Melan-A was positive S100 was positive SOX10 was positive and PRAME positive.  Staging PET in June revealed a focal hypermetabolic lesion of the left frontoparietal lobe, concerning for brain metastasis.  MRI brain confirmed the left frontoparietal lesion and revealed several other smaller lesions.  The solitary brain metastasis was treated with surgical resection, followed by stereotactic radiosurgery.  He was then placed on palliative ipilimumab/nivolumab with plans for 4 cycles.  Due to his first cycle on June 27.  He now has left facial weakness, which simply may represent a Bell's palsy.  Bell's palsy has been reported in patients on immunotherapy.  However, due to his known brain metastasis, MRI of the brain was obtained.  This revealed left temporal resection cavity demonstrates thin linear enhancement along the  resection margins, which may be postoperative, with additional small areas of somewhat more nodular enhancement, which are nonspecific but could indicate recurrent disease. Interval increase in the size of the right inferior frontal gyrus enhancing lesion with increased surrounding edema, mild local mass effect, and mild local midline shift.  Stable to slightly decreased size of the left pontine lesion with decreased associated edema.  Previously noted left inferior frontal gyrus lesion is no longer visualized. No definite new lesion.  He was placed on dexamethasone 4 mg twice daily, then decreased to 4 mg daily once his neurologic symptoms resolved. After discontinuation of the dexamethasone, he resumed immunotherapy receiving a 4th and final cycle of ipilimumab/nivolumab on October 9th.  He is scheduled to continue maintenance nivolumab next week, but has had severe diarrhea, for which Imodium is not effective..  Given the increase in the WBC, I am suspicious of infectious diarrhea.  I advised him to use Pepto-Bismol for the diarrhea.  Stool studies are pending.  If no infection found, we will need to hold immunotherapy and treat him with high dose prednisone for presumed immune-mediated colitis.  Malignant melanoma metastatic to lung [facility]) Bilateral hypermetabolic pulmonary nodules on PET in June, the largest measuring 2.3 cm,  hich had increased in size compared with the CT chest performed in May.  He was therefore treated with immunotherapy.  CTA in September revealed pulmonary embolism and changes consistent with bronchitis.  There was a decrease in the bilateral pulmonary nodules consistent with a response to therapy. There was interval increase in hilar and mediastinal lymph nodes, but this is commonly seen with immunotherapy.  He was  placed on anticoagulation and treated for bronchitis.  He now reports intermittent dry cough without shortness of breath or chest pain.  He completed 4 cycles  ipilimumab/nivolumab and was to start maintenance nivolumab next week.  We need to evaluate his diarrhea prior to proceeding.    Acute pulmonary embolism without acute cor pulmonale (HCC) Non occlusive segmental pulmonary embolism in the inferior left upper lobe diagnosed in September.  He was placed on rivaroxaban 15 mg twice daily for 3 weeks with plans to continue 20 mg daily indefinitely.  The patient states he has not been taking rivaroxaban as he forgot about it, despite written directions.  I gave him a pill box with am and pm slots, and instructed to use this to keep track of his medications. I reminded him that once he completes the twice daily dosing he will continue daily rivaroxaban 20 mg.  Melanoma of skin (HCC) Stage IIB malignant melanoma of the right posterior shoulder, Breslow's depth 3.3 mm, Clark level IV, treated with wide local excision, diagnosed in May 2021.  There was still a question as to whether this represented a metastatic lesion, rather than a primary, based on the presence of 2 nodules and no epidermal involvement.  MRI head and CT chest, abdomen and pelvis in June 2021 were negative for metastatic disease.   No adjuvant therapy was recommended.  PET scan in September was negative for metastatic disease. Chest x-ray in June 2022 was negative.  He was found to have recurrence in mid back in June of this year.  Biopsy revealed metastatic malignant melanoma.  Melan-A was positive S100 was positive SOX10 was positive and PRAME positive.  Staging PET in June revealed a focal hypermetabolic lesion of the left frontoparietal lobe, concerning for brain metastasis.  There was a small hypermetabolic subcentimeter liver lesion concerning for liver metastatic disease.   There were bilateral hypermetabolic pulmonary nodules.  There were hypermetabolic retroperitoneal and right pelvic lymph nodes, consistent with metastatic disease.  There were multiple hypermetabolic subcutaneous soft tissue  nodules, also compatible with metastatic disease.  MRI brain confirmed the left frontoparietal lesion and revealed several other smaller lesions.  The solitary brain metastasis was treated with surgical resection, followed by stereotactic radiosurgery.    He was then placed on palliative ipilimumab/nivolumab and has had resolution of the palpable masses in the back and left flank.  He was to start maintenance nivolumab next week, but we need to determine the cause of his diarrhea.   The patient understands the plans discussed today and is in agreement with them.  He knows to contact our office if he develops concerns prior to his next appointment.   I provided 30 minutes of face-to-face time during this encounter and > 50% was spent counseling as documented under my assessment and plan.    [name], PA-C  [facility] [address] [place] [address] Dept: [phone] Dept Fax: [phone]   Orders Placed This Encounter  Procedures   GI pathogen panel by PCR, stool    Standing Status:   Future    Standing Expiration Date:   05/02/2023   C difficile quick screen w PCR reflex    Standing Status:   Future    Number of Occurrences:   1    Standing Expiration Date:   46/96/2952   Basic metabolic panel    This external order was created through the Results Console.   Comprehensive  metabolic panel    This external order was created through the Results Console.   Hepatic function panel    This external order was created through the Results Console.   Corrected Calcium    This order was created through External Result Entry   CBC and differential    This external order was created through the Results Console.   CBC    This external order was created through the Results Console.      CHIEF COMPLAINT:  CC: Metastatic melanoma  Current Treatment: Maintenance nivolumab every 4 weeks  HISTORY OF PRESENT  ILLNESS:   Oncology History  Melanoma of skin ([place])  12/01/2019 Initial Diagnosis   Melanoma of skin ([place])   12/05/2019 Cancer Staging   Staging form: Melanoma of the Skin, AJCC 8th Edition - Clinical stage from 12/05/2019: Stage IIA (cT3a, cN0, cM0) - Signed by Derwood Kaplan, MD on 05/30/2020   02/18/2021 Genetic Testing   No pathogenic variants detected in [facility] Multi-Cancer +RNA Panel.  Variants of uncertain significance detected in APC (c.681C>G (p.Asp227Glu)), CDKN1C (c.610C>G (p.Pro204Ala)), and FH (c.1151C>T (p.Ala384Val)).  The report date is February 18, 2021.    The Multi-Cancer + RNA Panel offered by [facility] includes sequencing and/or deletion/duplication analysis of the following 84 genes:  AIP*, ALK, APC*, ATM*, AXIN2*, BAP1*, BARD1*, BLM*, BMPR1A*, BRCA1*, BRCA2*, BRIP1*, CASR, CDC73*, CDH1*, CDK4, CDKN1B*, CDKN1C*, CDKN2A, CEBPA, CHEK2*, CTNNA1*, DICER1*, DIS3L2*, EGFR, EPCAM, FH*, FLCN*, GATA2*, GPC3, GREM1, HOXB13, HRAS, KIT, MAX*, MEN1*, MET, MITF, MLH1*, MSH2*, MSH3*, MSH6*, MUTYH*, NBN*, NF1*, NF2*, NTHL1*, PALB2*, PDGFRA, PHOX2B, PMS2*, POLD1*, POLE*, POT1*, PRKAR1A*, PTCH1*, PTEN*, RAD50*, RAD51C*, RAD51D*, RB1*, RECQL4, RET, RUNX1*, SDHA*, SDHAF2*, SDHB*, SDHC*, SDHD*, SMAD4*, SMARCA4*, SMARCB1*, SMARCE1*, STK11*, SUFU*, TERC, TERT, TMEM127*, Tp53*, TSC1*, TSC2*, VHL*, WRN*, and WT1.  RNA analysis is performed for * genes.   01/30/2022 - 02/20/2022 Chemotherapy   Patient is on Treatment Plan : MELANOMA Nivolumab + Ipilimumab (1/3) q21d / Nivolumab q28d     01/30/2022 -  Chemotherapy   Patient is on Treatment Plan : MELANOMA Nivolumab (1) + Ipilimumab (3) q21d / Nivolumab (480) q28d     Malignant melanoma metastatic to brain ([place])  12/19/2021 Initial Diagnosis   Malignant melanoma metastatic to brain ([place])   01/30/2022 - 02/20/2022 Chemotherapy   Patient is on Treatment Plan : MELANOMA Nivolumab + Ipilimumab (1/3) q21d / Nivolumab q28d     01/30/2022 -  Chemotherapy    Patient is on Treatment Plan : MELANOMA Nivolumab (1) + Ipilimumab (3) q21d / Nivolumab (480) q28d     Malignant melanoma metastatic to lung ([place])  12/19/2021 Initial Diagnosis   Malignant melanoma metastatic to lung ([place])   01/30/2022 - 02/20/2022 Chemotherapy   Patient is on Treatment Plan : MELANOMA Nivolumab + Ipilimumab (1/3) q21d / Nivolumab q28d     01/30/2022 -  Chemotherapy   Patient is on Treatment Plan : MELANOMA Nivolumab (1) + Ipilimumab (3) q21d / Nivolumab (480) q28d     03/24/2022 Imaging   CTA chest:  IMPRESSION:  1. Small nonocclusive segmental level pulmonary embolism in the  inferior LEFT upper lobe.  2. Marked bronchial wall thickening with areas of patchy basilar  opacities and new ground-glass opacities in the upper lobes.  Constellation of findings may be infectious bronchiolitis/bronchitis  though the possibility of drug related changes in the setting of  immunotherapy are also considered. Furthermore, given basilar  predominance and some material in basilar bronchial structures would  correlate with any risk factors  for or history of aspiration.  3. Response to therapy of bilateral metastatic lesions. Some lymph  nodes in the chest with interval increase in size are equivocal at  this time, potentially reactive, but warrant attention on subsequent  imaging.    Malignant neoplasm metastatic to liver ([place])  12/19/2021 Initial Diagnosis   Malignant neoplasm metastatic to liver ([place])   01/30/2022 - 02/20/2022 Chemotherapy   Patient is on Treatment Plan : MELANOMA Nivolumab + Ipilimumab (1/3) q21d / Nivolumab q28d     01/30/2022 -  Chemotherapy   Patient is on Treatment Plan : MELANOMA Nivolumab (1) + Ipilimumab (3) q21d / Nivolumab (480) q28d         INTERVAL HISTORY:  [place] is here today for repeat clinical assessment prior to 5th cycle of immunotherapy, which now consists of nivolumab every 3 weeks.  He reports severe diarrhea for about a week, with stools  every 45 minutes at times.  He states Imodium has been ineffective.  He reports vomiting over the weekend, which has resolved he also had itching and rash with his last infusion, which has resolved.  He reports fatigue.  He denies fevers or chills. He denies pain. His appetite is decreased and he has not been eating well.  He states he is drinking plenty of fluids. His weight has decreased 10 pounds over last 3 weeks .  He is still trying to work, but he does not feel up to it today.  REVIEW OF SYSTEMS:  Review of Systems  Constitutional:  Positive for appetite change, fatigue and unexpected weight change. Negative for chills and fever.  HENT:   Negative for lump/mass, mouth sores and sore throat.   Respiratory:  Positive for cough. Negative for shortness of breath.   Cardiovascular:  Negative for chest pain and leg swelling.  Gastrointestinal:  Positive for diarrhea and vomiting. Negative for abdominal pain, constipation and nausea.  Genitourinary:  Negative for difficulty urinating, dysuria, frequency and hematuria.   Musculoskeletal:  Negative for arthralgias, back pain and myalgias.  Skin:  Positive for itching and rash. Negative for wound.  Neurological:  Negative for dizziness, extremity weakness, headaches, light-headedness and numbness.  Hematological:  Negative for adenopathy.  Psychiatric/Behavioral:  Negative for depression and sleep disturbance. The patient is not nervous/anxious.      VITALS:  Blood pressure 124/84, pulse (!) 129, temperature 98.7 F (37.1 C), temperature source Oral, resp. rate 20, height 5' 9"  (1.753 m), weight 148 lb 12.8 oz (67.5 kg), SpO2 98 %.  Wt Readings from Last 3 Encounters:  05/01/22 148 lb 12.8 oz (67.5 kg)  04/18/22 159 lb 11.2 oz (72.4 kg)  04/14/22 158 lb (71.7 kg)    Body mass index is 21.97 kg/m.  Performance status (ECOG): 2 - Symptomatic, <50% confined to bed  PHYSICAL EXAM:  Physical Exam Vitals and nursing note reviewed.   Constitutional:      General: He is not in acute distress.    Appearance: Normal appearance. He is normal weight.  HENT:     Head: Normocephalic and atraumatic.     Mouth/Throat:     Mouth: Mucous membranes are moist.     Pharynx: Oropharynx is clear. No oropharyngeal exudate or posterior oropharyngeal erythema.  Eyes:     General: No scleral icterus.    Extraocular Movements: Extraocular movements intact.     Conjunctiva/sclera: Conjunctivae normal.     Pupils: Pupils are equal, round, and reactive to light.  Cardiovascular:     Rate  and Rhythm: Normal rate and regular rhythm.     Heart sounds: Normal heart sounds. No murmur heard.    No friction rub. No gallop.  Pulmonary:     Effort: Pulmonary effort is normal.     Breath sounds: Normal breath sounds. No wheezing, rhonchi or rales.  Abdominal:     General: Bowel sounds are normal. There is no distension.     Palpations: Abdomen is soft. There is no hepatomegaly, splenomegaly or mass.     Tenderness: There is no abdominal tenderness.  Musculoskeletal:        General: Normal range of motion.     Cervical back: Normal range of motion and neck supple. No tenderness.     Right lower leg: No edema.     Left lower leg: No edema.  Lymphadenopathy:     Cervical: No cervical adenopathy.     Upper Body:     Right upper body: No supraclavicular or axillary adenopathy.     Left upper body: No supraclavicular or axillary adenopathy.     Lower Body: Right inguinal adenopathy (Subcentimeter right inguinal node) present. No left inguinal adenopathy.  Skin:    General: Skin is warm and dry.     Coloration: Skin is not jaundiced.     Findings: No lesion or rash.  Neurological:     Mental Status: He is alert and oriented to person, place, and time.     Cranial Nerves: No cranial nerve deficit.  Psychiatric:        Mood and Affect: Mood normal.        Behavior: Behavior normal.        Thought Content: Thought content normal.      LABS:      Latest Ref Rng & Units 05/01/2022   12:00 AM 04/10/2022   12:00 AM 04/01/2022   12:00 AM  CBC  WBC  16.0     7.3     9.8      Hemoglobin 13.5 - 17.5 15.7     13.7     13.6      Hematocrit 41 - 53 47     41     40      Platelets 150 - 400 K/uL 456     356     567         This result is from an external source.      Latest Ref Rng & Units 05/01/2022   12:00 AM 04/10/2022   12:00 AM 04/01/2022   12:00 AM  CMP  BUN 4 - 21 16     17     18       Creatinine 0.6 - 1.3 1.2     0.9     0.8      Sodium 137 - 147 132     140     134      Potassium 3.5 - 5.1 mEq/L 4.1     4.1     4.2      Chloride 99 - 108 94     103     102      CO2 13 - 22 24     32     26      Calcium 8.7 - 10.7 8.8     9.5     9.6      Alkaline Phos 25 - 125 76     69     72  AST 14 - 40 25     26     22       ALT 10 - 40 U/L 17     17     19          This result is from an external source.     No results found for: "CEA1", "CEA" / No results found for: "CEA1", "CEA" No results found for: "PSA1" No results found for: "JJK093" No results found for: "CAN125"  No results found for: "TOTALPROTELP", "ALBUMINELP", "A1GS", "A2GS", "BETS", "BETA2SER", "GAMS", "MSPIKE", "SPEI" No results found for: "TIBC", "FERRITIN", "IRONPCTSAT" Lab Results  Component Value Date   LDH 145 12/19/2021    STUDIES:  No results found.    HISTORY:   Past Medical History:  Diagnosis Date   Acute pulmonary embolism without acute cor pulmonale ([place]) 05/01/2022   Depression    mild   Family history of breast cancer 01/22/2021   Family history of prostate cancer 01/22/2021   Genital herpes    History of back injury 2003   Pruritic rash 02/18/2022   Skin cancer    melanoma    Past Surgical History:  Procedure Laterality Date   APPLICATION OF CRANIAL NAVIGATION N/A 01/03/2022   Procedure: APPLICATION OF CRANIAL NAVIGATION;  Surgeon: Consuella Lose, MD;  Location: [facility];  Service: Neurosurgery;  Laterality:  N/A;   CRANIOTOMY Left 01/03/2022   Procedure: Stereotactic Left temporal craniotomy for resection of tumor;  Surgeon: Consuella Lose, MD;  Location: [facility];  Service: Neurosurgery;  Laterality: Left;   MELANOMA EXCISION WITH SENTINEL LYMPH NODE BIOPSY Right 2021   SPINAL FUSION W/ LUQUE UNIT ROD  2003    Family History  Problem Relation Age of Onset   Prostate cancer Father        dx 10s   Breast cancer Maternal Aunt        dx after 68; bilateral mastectomy   Stomach cancer Maternal Grandfather        dx 58s   Breast cancer Cousin 73       maternal male cousin; mets    Social History:  reports that he quit smoking about 25 years ago. His smoking use included cigarettes. He has never used smokeless tobacco. He reports current alcohol use of about 14.0 standard drinks of alcohol per week. He reports that he does not currently use drugs.The patient is alone today.  Allergies:  Allergies  Allergen Reactions   Testosterone Itching    Gel    Sulfa Antibiotics Rash    Current Medications: Current Outpatient Medications  Medication Sig Dispense Refill   acyclovir (ZOVIRAX) 200 MG capsule Take by mouth.     amitriptyline (ELAVIL) 25 MG tablet Take 50 mg by mouth at bedtime.     dexamethasone (DECADRON) 4 MG tablet Take by mouth.     ergocalciferol (VITAMIN D2) 1.25 MG (50000 UT) capsule Take 50,000 Units by mouth every Wednesday.     hydrOXYzine (ATARAX) 25 MG tablet Take 1 tablet (25 mg total) by mouth 3 (three) times daily as needed for itching. (Patient not taking: Reported on 03/31/2022) 60 tablet 3   lidocaine-prilocaine (EMLA) cream Apply 1 Application topically as needed. 30 g 0   ondansetron (ZOFRAN) 4 MG tablet Take 1 tablet (4 mg total) by mouth every 4 (four) hours as needed for nausea. (Patient not taking: Reported on 03/31/2022) 90 tablet 3   prochlorperazine (COMPAZINE) 10 MG tablet Take 1 tablet (10 mg total) by mouth  every 6 (six) hours as needed for nausea or  vomiting. (Patient not taking: Reported on 03/31/2022) 90 tablet 3   Rivaroxaban (XARELTO) 15 MG TABS tablet Take 1 tablet (15 mg total) by mouth 2 (two) times daily with a meal. For 21 days, then switch to 20 mg daily (Patient not taking: Reported on 05/01/2022) 60 tablet 0   rivaroxaban (XARELTO) 20 MG TABS tablet Take 1 tablet (20 mg total) by mouth daily with supper. Start after completing 3 weeks of Xarelto 15 mg twice daily (Patient not taking: Reported on 03/31/2022) 30 tablet 3   No current facility-administered medications for this visit.

## 2022-05-01 NOTE — Assessment & Plan Note (Signed)
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 There was still a question as to whether this represented a metastatic lesion, rather than a primary, based on the presence of 2 nodules and no epidermal involvement. MRI head and CT chest, abdomen and pelvis in June 2021 were negative for metastatic disease. No adjuvant therapy was recommended. PET scan in September was negative for metastatic disease. Chest x-ray in June2022was negative.  He was found to have recurrence in mid back in June of this year.  Biopsy revealed metastatic malignant melanoma.  Melan-A was positive S100 was positive SOX10 was positive and PRAME positive.  Staging PET in June revealed a focal hypermetabolic lesion of the left frontoparietal lobe, concerning for brain metastasis.  There was a small hypermetabolic subcentimeter liver lesion concerning for liver metastatic disease.   There were bilateral hypermetabolic pulmonary nodules.  There were hypermetabolic retroperitoneal and right pelvic lymph nodes, consistent with metastatic disease.  There were multiple hypermetabolic subcutaneous soft tissue nodules, also compatible with metastatic disease.  MRI brain confirmed the left frontoparietal lesion and revealed several other smaller lesions.  The solitary brain metastasis was treated with surgical resection, followed by stereotactic radiosurgery.    He was then placed on palliative ipilimumab/nivolumab and has had resolution of the palpable masses in the back and left flank.  He was to start maintenance nivolumab next week, but we need to determine the cause of his diarrhea.

## 2022-05-01 NOTE — Assessment & Plan Note (Addendum)
Stage IIB malignant melanoma of the right posterior shoulder with recurrence in the mid back in June of this year.  Biopsy revealed metastatic malignant melanoma.  Melan-A was positive S100 was positive SOX10 was positive and PRAME positive.  Staging PET in June revealed a focal hypermetabolic lesion of the left frontoparietal lobe, concerning for brain metastasis.  MRI brain confirmed the left frontoparietal lesion and revealed several other smaller lesions.  The solitary brain metastasis was treated with surgical resection, followed by stereotactic radiosurgery.  He was then placed on palliative ipilimumab/nivolumab with plans for 4 cycles.  Due to his first cycle on June 27.  He now has left facial weakness, which simply may represent a Bell's palsy.  Bell's palsy has been reported in patients on immunotherapy.  However, due to his known brain metastasis, MRI of the brain was obtained.  This revealed left temporal resection cavity demonstrates thin linear enhancement along the resection margins, which may be postoperative, with additional small areas of somewhat more nodular enhancement, which are nonspecific but could indicate recurrent disease. Interval increase in the size of the right inferior frontal gyrus enhancing lesion with increased surrounding edema, mild local mass effect, and mild local midline shift.  Stable to slightly decreased size of the left pontine lesion with decreased associated edema.  Previously noted left inferior frontal gyrus lesion is no longer visualized. No definite new lesion.  He was placed on dexamethasone 4 mg twice daily, then decreased to 4 mg daily once his neurologic symptoms resolved. After discontinuation of the dexamethasone, he resumed immunotherapy receiving a 4th and final cycle of ipilimumab/nivolumab on October 9th.  He is scheduled to continue maintenance nivolumab next week, but has had severe diarrhea, for which Imodium is not effective..  Given the increase in  the WBC, I am suspicious of infectious diarrhea.  I advised him to use Pepto-Bismol for the diarrhea.  Stool studies are pending.  If no infection found, we will need to hold immunotherapy and treat him with high dose prednisone for presumed immune-mediated colitis.

## 2022-05-01 NOTE — Assessment & Plan Note (Addendum)
Non occlusive segmental pulmonary embolism in the inferior left upper lobe diagnosed in September.  He was placed on rivaroxaban 15 mg twice daily for 3 weeks with plans to continue 20 mg daily indefinitely.  The patient states he has not been taking rivaroxaban as he forgot about it, despite written directions.  I gave him a pill box with am and pm slots, and instructed to use this to keep track of his medications. I reminded him that once he completes the twice daily dosing he will continue daily rivaroxaban 20 mg.

## 2022-05-02 ENCOUNTER — Other Ambulatory Visit: Payer: Self-pay | Admitting: Pharmacist

## 2022-05-02 ENCOUNTER — Other Ambulatory Visit: Payer: Self-pay | Admitting: Hematology and Oncology

## 2022-05-02 DIAGNOSIS — C439 Malignant melanoma of skin, unspecified: Secondary | ICD-10-CM

## 2022-05-02 DIAGNOSIS — C7931 Secondary malignant neoplasm of brain: Secondary | ICD-10-CM

## 2022-05-02 DIAGNOSIS — C7800 Secondary malignant neoplasm of unspecified lung: Secondary | ICD-10-CM

## 2022-05-02 DIAGNOSIS — C787 Secondary malignant neoplasm of liver and intrahepatic bile duct: Secondary | ICD-10-CM

## 2022-05-02 MED ORDER — PREDNISONE 20 MG PO TABS: 60.0000 mg | ORAL_TABLET | Freq: Every day | ORAL | 1 refills | Status: DC

## 2022-05-02 NOTE — Addendum Note (Signed)
Addended by: Rosanne Sack A on: 05/02/2022 12:44 PM   Modules accepted: Orders

## 2022-05-03 ENCOUNTER — Encounter: Payer: Self-pay | Admitting: Oncology

## 2022-05-05 ENCOUNTER — Ambulatory Visit: Payer: Medicare Other

## 2022-05-05 DIAGNOSIS — C7931 Secondary malignant neoplasm of brain: Secondary | ICD-10-CM | POA: Diagnosis not present

## 2022-05-06 ENCOUNTER — Ambulatory Visit: Payer: 59 | Admitting: Dietician

## 2022-05-06 NOTE — Progress Notes (Signed)
Nutrition Assessment   Reason for Assessment: MST screen for weight loss.    ASSESSMENT: Patient is a 51 year old male with metastatic malignant melanoma with mets to liver, lymph nodes, and lungs.  Called him at his mobile# he reports he's been having 12 episodes of diarrhea daily and Pepto and Imodium haven't helped.    Call was cut off and tried to reconnect but he didn't pick up again.  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  Encouraged alternating water with electrolyte replacement drinks.  Encouraged increasing frequency of fees and soft soluble fibers.  Contact information provided   MONITORING, EVALUATION, GOAL: weight, PO intake, Nutrition Impact Symptoms, labs Goal is weight maintenance  Next Visit: next week   April Manson, RDN, LDN Registered Dietitian, [place] Part Time Remote (Usual office hours: Tuesday-Thursday) Cell: [phone]

## 2022-05-07 ENCOUNTER — Other Ambulatory Visit: Payer: Self-pay

## 2022-05-08 ENCOUNTER — Inpatient Hospital Stay: Payer: Medicare Other

## 2022-05-08 ENCOUNTER — Encounter: Payer: Self-pay | Admitting: Hematology and Oncology

## 2022-05-08 ENCOUNTER — Inpatient Hospital Stay: Payer: Medicare Other | Attending: Hematology and Oncology | Admitting: Hematology and Oncology

## 2022-05-08 VITALS — BP 137/82 | HR 93 | Temp 98.1°F | Resp 20 | Ht 69.0 in | Wt 147.0 lb

## 2022-05-08 DIAGNOSIS — R197 Diarrhea, unspecified: Secondary | ICD-10-CM

## 2022-05-08 DIAGNOSIS — C787 Secondary malignant neoplasm of liver and intrahepatic bile duct: Secondary | ICD-10-CM | POA: Insufficient documentation

## 2022-05-08 DIAGNOSIS — C7801 Secondary malignant neoplasm of right lung: Secondary | ICD-10-CM | POA: Insufficient documentation

## 2022-05-08 DIAGNOSIS — K523 Indeterminate colitis: Secondary | ICD-10-CM

## 2022-05-08 DIAGNOSIS — C439 Malignant melanoma of skin, unspecified: Secondary | ICD-10-CM | POA: Diagnosis not present

## 2022-05-08 DIAGNOSIS — C7800 Secondary malignant neoplasm of unspecified lung: Secondary | ICD-10-CM | POA: Diagnosis not present

## 2022-05-08 DIAGNOSIS — Z79899 Other long term (current) drug therapy: Secondary | ICD-10-CM | POA: Insufficient documentation

## 2022-05-08 DIAGNOSIS — C778 Secondary and unspecified malignant neoplasm of lymph nodes of multiple regions: Secondary | ICD-10-CM | POA: Diagnosis not present

## 2022-05-08 DIAGNOSIS — I2699 Other pulmonary embolism without acute cor pulmonale: Secondary | ICD-10-CM | POA: Diagnosis not present

## 2022-05-08 DIAGNOSIS — Z5112 Encounter for antineoplastic immunotherapy: Secondary | ICD-10-CM | POA: Insufficient documentation

## 2022-05-08 DIAGNOSIS — C7931 Secondary malignant neoplasm of brain: Secondary | ICD-10-CM | POA: Insufficient documentation

## 2022-05-08 DIAGNOSIS — C4361 Malignant melanoma of right upper limb, including shoulder: Secondary | ICD-10-CM | POA: Insufficient documentation

## 2022-05-08 DIAGNOSIS — C7802 Secondary malignant neoplasm of left lung: Secondary | ICD-10-CM | POA: Insufficient documentation

## 2022-05-08 LAB — COMPREHENSIVE METABOLIC PANEL
Albumin: 3.3 — AB (ref 3.5–5.0)
Calcium: 8.9 (ref 8.7–10.7)

## 2022-05-08 LAB — HEPATIC FUNCTION PANEL
ALT: 18 U/L (ref 10–40)
AST: 20 (ref 14–40)
Alkaline Phosphatase: 90 (ref 25–125)
Bilirubin, Total: 0.2

## 2022-05-08 LAB — PROTEIN, TOTAL: Total Protein: 6.2 g/dL — AB (ref 6.3–8.2)

## 2022-05-08 LAB — BASIC METABOLIC PANEL
BUN: 11 (ref 4–21)
CO2: 24 — AB (ref 13–22)
Chloride: 102 (ref 99–108)
Creatinine: 0.9 (ref 0.6–1.3)
Glucose: 78
Potassium: 3.6 mEq/L (ref 3.5–5.1)
Sodium: 132 — AB (ref 137–147)

## 2022-05-08 LAB — CBC AND DIFFERENTIAL
HCT: 41 (ref 41–53)
Hemoglobin: 13.7 (ref 13.5–17.5)
MCV: 92 (ref 80–94)
Neutrophils Absolute: 14.51
Platelets: 529 10*3/uL — AB (ref 150–400)
WBC: 18.6

## 2022-05-08 LAB — CBC: RBC: 4.42 (ref 3.87–5.11)

## 2022-05-08 LAB — CORRECTED CALCIUM (CC13): Calcium, Corrected: 9.6

## 2022-05-08 NOTE — Assessment & Plan Note (Signed)
Bilateral hypermetabolic pulmonary nodules on PET in June, the largest measuring 2.3 cm, which had increased in size compared with the CT chest performed in May.  He was therefore placed on immunotherapy.  BRAF testing was negative.  CTA in September revealed pulmonary embolism and changes consistent with bronchitis.  There was a decrease in the bilateral pulmonary nodules consistent with a response to therapy. There was interval increase in hilar and mediastinal lymph nodes, but this is commonly seen with immunotherapy.  He was placed on anticoagulation and treated for bronchitis.  Prior to starting maintenance nivolumab he developed acute onset diarrhea felt to be immune mediated, so immunotherapy was placed on hold and he was started on high dose prednisone, which he continues.  

## 2022-05-08 NOTE — Assessment & Plan Note (Signed)
Stage IIB malignant melanoma of the right posterior shoulder with recurrence in the mid back in June of this year.  Biopsy revealed metastatic malignant melanoma.  Melan-A was positive S100 was positive SOX10 was positive and PRAME positive.  Staging PET in June revealed a focal hypermetabolic lesion of the left frontoparietal lobe, concerning for brain metastasis.  MRI brain confirmed the left frontoparietal lesion and revealed several other smaller lesions.  The solitary brain metastasis was treated with surgical resection, followed by stereotactic radiosurgery.  He was then placed on palliative ipilimumab/nivolumab with plans for 4 cycles.  Due to his first cycle on June 27.  He now has left facial weakness, which simply may represent a Bell's palsy.  Bell's palsy has been reported in patients on immunotherapy.  However, due to his known brain metastasis, MRI of the brain was obtained.  This revealed left temporal resection cavity demonstrates thin linear enhancement along the resection margins, which may be postoperative, with additional small areas of somewhat more nodular enhancement, which are nonspecific but could indicate recurrent disease. Interval increase in the size of the right inferior frontal gyrus enhancing lesion with increased surrounding edema, mild local mass effect, and mild local midline shift.  Stable to slightly decreased size of the left pontine lesion with decreased associated edema.  Previously noted left inferior frontal gyrus lesion is no longer visualized. No definite new lesion.  He was placed on dexamethasone 4 mg twice daily, then decreased to 4 mg daily once his neurologic symptoms resolved. After discontinuation of the dexamethasone, he resumed immunotherapy receiving a 4th and final cycle of ipilimumab/nivolumab on October 9th.  He is due to start maintenance nivolumab, but has developed severe diarrhea felt to be immune mediated so immunotherapy is on hold. He is to follow up  with Dr. Mickeal Skinner  in December.

## 2022-05-08 NOTE — Progress Notes (Signed)
[place]  [address] [place],  [place]  [address] [phone]  Clinic Day:  05/08/2022  Referring physician: Earlyne Iba, NP  ASSESSMENT & PLAN:   Assessment & Plan: Indeterminate colitis Acute onset severe diarrhea. Stool studies did not reveal an infectious cause. 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 Unfortunately, he has persistent diarrhea on an hourly basis, but he is only taking prednisone 40 mg daily most days. He states Imodium decreases the frequency of diarrhea.  I recommended he taking prednisone 20 mg all 3 tabs with breakfast to be sure he is treated appropriately.  I also recommended he take Imodium 4 times daily unless he has constipation. He can also use Pepto Bismol as needed.  I will plan to see him back in 1 week.  He knows to contact us if his diarrhea doesn't improve with these measures.  Malignant melanoma metastatic to lung [facility]) Bilateral hypermetabolic pulmonary nodules on PET in June, the largest measuring 2.3 cm,  which had increased in size compared with the CT chest performed in May.  He was therefore treated with immunotherapy.  CTA in September revealed pulmonary embolism and changes consistent with bronchitis.  There was a decrease in the bilateral pulmonary nodules consistent with a response to therapy. There was interval increase in hilar and mediastinal lymph nodes, but this is commonly seen with immunotherapy.  He was placed on anticoagulation and treated for bronchitis.  Prior to starting maintenance nivolumab he developed acute onset diarrhea felt to be immune mediated, so immunotherapy was placed on hold and he was started on high dose prednisone.   Malignant melanoma metastatic to brain [facility]) Stage IIB malignant melanoma of the right posterior shoulder with recurrence in the mid back in June of this year.   Biopsy revealed metastatic malignant melanoma.  Melan-A was positive S100 was positive SOX10 was positive and PRAME positive.  Staging PET in June revealed a focal hypermetabolic lesion of the left frontoparietal lobe, concerning for brain metastasis.  MRI brain confirmed the left frontoparietal lesion and revealed several other smaller lesions.  The solitary brain metastasis was treated with surgical resection, followed by stereotactic radiosurgery.  He was then placed on palliative ipilimumab/nivolumab with plans for 4 cycles.  Due to his first cycle on June 27.  He now has left facial weakness, which simply may represent a Bell's palsy.  Bell's palsy has been reported in patients on immunotherapy.  However, due to his known brain metastasis, MRI of the brain was obtained.  This revealed left temporal resection cavity demonstrates thin linear enhancement along the resection margins, which may be postoperative, with additional small areas of somewhat more nodular enhancement, which are nonspecific but could indicate recurrent disease. Interval increase in the size of the right inferior frontal gyrus enhancing lesion with increased surrounding edema, mild local mass effect, and mild local midline shift.  Stable to slightly decreased size of the left pontine lesion with decreased associated edema.  Previously noted left inferior frontal gyrus lesion is no longer visualized. No definite new lesion.  He was placed on dexamethasone 4 mg twice daily, then decreased to 4 mg daily once his neurologic symptoms resolved. After discontinuation of the dexamethasone, he resumed immunotherapy receiving a 4th and final cycle of ipilimumab/nivolumab on October 9th.  He is due to start maintenance nivolumab, but has developed severe diarrhea felt to be  immune mediated so immunotherapy is on hold. He is to follow up with Dr. Mickeal Skinner  in December.    The patient understands the plans discussed today and is in agreement with them.   He knows to contact our office if he develops concerns prior to his next appointment.     [name], PA-C  [facility] AT [facility] [address] [place] [address] Dept: [phone] Dept Fax: [phone]   Orders Placed This Encounter  Procedures   Basic metabolic panel    This external order was created through the Results Console.   Comprehensive metabolic panel    This external order was created through the Results Console.   Hepatic function panel    This external order was created through the Results Console.   Protein, total    This order was created through External Result Entry   Corrected Calcium    This order was created through External Result Entry   CBC and differential    This external order was created through the Results Console.   CBC    This external order was created through the Results Console.      CHIEF COMPLAINT:  CC: Metastatic melanoma  Current Treatment:  On hold  HISTORY OF PRESENT ILLNESS:   Oncology History  Melanoma of skin ([place])  12/01/2019 Initial Diagnosis   Melanoma of skin ([place])   12/05/2019 Cancer Staging   Staging form: Melanoma of the Skin, AJCC 8th Edition - Clinical stage from 12/05/2019: Stage IIA (cT3a, cN0, cM0) - Signed by Derwood Kaplan, MD on 05/30/2020   02/18/2021 Genetic Testing   No pathogenic variants detected in [facility] Multi-Cancer +RNA Panel.  Variants of uncertain significance detected in APC (c.681C>G (p.Asp227Glu)), CDKN1C (c.610C>G (p.Pro204Ala)), and FH (c.1151C>T (p.Ala384Val)).  The report date is February 18, 2021.    The Multi-Cancer + RNA Panel offered by [facility] includes sequencing and/or deletion/duplication analysis of the following 84 genes:  AIP*, ALK, APC*, ATM*, AXIN2*, BAP1*, BARD1*, BLM*, BMPR1A*, BRCA1*, BRCA2*, BRIP1*, CASR, CDC73*, CDH1*, CDK4, CDKN1B*, CDKN1C*, CDKN2A, CEBPA, CHEK2*, CTNNA1*, DICER1*, DIS3L2*, EGFR, EPCAM, FH*,  FLCN*, GATA2*, GPC3, GREM1, HOXB13, HRAS, KIT, MAX*, MEN1*, MET, MITF, MLH1*, MSH2*, MSH3*, MSH6*, MUTYH*, NBN*, NF1*, NF2*, NTHL1*, PALB2*, PDGFRA, PHOX2B, PMS2*, POLD1*, POLE*, POT1*, PRKAR1A*, PTCH1*, PTEN*, RAD50*, RAD51C*, RAD51D*, RB1*, RECQL4, RET, RUNX1*, SDHA*, SDHAF2*, SDHB*, SDHC*, SDHD*, SMAD4*, SMARCA4*, SMARCB1*, SMARCE1*, STK11*, SUFU*, TERC, TERT, TMEM127*, Tp53*, TSC1*, TSC2*, VHL*, WRN*, and WT1.  RNA analysis is performed for * genes.   01/30/2022 - 02/20/2022 Chemotherapy   Patient is on Treatment Plan : MELANOMA Nivolumab + Ipilimumab (1/3) q21d / Nivolumab q28d     01/30/2022 -  Chemotherapy   Patient is on Treatment Plan : MELANOMA Nivolumab (1) + Ipilimumab (3) q21d / Nivolumab (480) q28d     Malignant melanoma metastatic to brain ([place])  12/19/2021 Initial Diagnosis   Malignant melanoma metastatic to brain ([place])   01/30/2022 - 02/20/2022 Chemotherapy   Patient is on Treatment Plan : MELANOMA Nivolumab + Ipilimumab (1/3) q21d / Nivolumab q28d     01/30/2022 -  Chemotherapy   Patient is on Treatment Plan : MELANOMA Nivolumab (1) + Ipilimumab (3) q21d / Nivolumab (480) q28d     Malignant melanoma metastatic to lung ([place])  12/19/2021 Initial Diagnosis   Malignant melanoma metastatic to lung ([place])   01/30/2022 - 02/20/2022 Chemotherapy   Patient is on Treatment Plan : MELANOMA Nivolumab + Ipilimumab (1/3) q21d /  Nivolumab q28d     01/30/2022 -  Chemotherapy   Patient is on Treatment Plan : MELANOMA Nivolumab (1) + Ipilimumab (3) q21d / Nivolumab (480) q28d     03/24/2022 Imaging   CTA chest:  IMPRESSION:  1. Small nonocclusive segmental level pulmonary embolism in the  inferior LEFT upper lobe.  2. Marked bronchial wall thickening with areas of patchy basilar  opacities and new ground-glass opacities in the upper lobes.  Constellation of findings may be infectious bronchiolitis/bronchitis  though the possibility of drug related changes in the setting of  immunotherapy are  also considered. Furthermore, given basilar  predominance and some material in basilar bronchial structures would  correlate with any risk factors for or history of aspiration.  3. Response to therapy of bilateral metastatic lesions. Some lymph  nodes in the chest with interval increase in size are equivocal at  this time, potentially reactive, but warrant attention on subsequent  imaging.    Malignant neoplasm metastatic to liver ([place])  12/19/2021 Initial Diagnosis   Malignant neoplasm metastatic to liver ([place])   01/30/2022 - 02/20/2022 Chemotherapy   Patient is on Treatment Plan : MELANOMA Nivolumab + Ipilimumab (1/3) q21d / Nivolumab q28d     01/30/2022 -  Chemotherapy   Patient is on Treatment Plan : MELANOMA Nivolumab (1) + Ipilimumab (3) q21d / Nivolumab (480) q28d         INTERVAL HISTORY:  Xavier Jones is here today for repeat clinical assessment after being placed on prednisone 60 mg daily for presumed immune mediated diarrhea.  Unfortunately, he has not had improvement in his diarrhea.  He continues to report bowl movement every hour during the day and several times overnight.  He states he is taking at least 40 mg prednisone daily, but he frequently misses the lunchtime dose, as he continues to work.  He states that Imodium does decrease the frequency of the diarrhea, but he is not using it regularly. He denies fevers or chills. He denies pain. His appetite is good. His weight has decreased 1 pounds over last week .  REVIEW OF SYSTEMS:  Review of Systems  Constitutional:  Positive for appetite change, fatigue and unexpected weight change. Negative for chills and fever.  HENT:   Negative for lump/mass, mouth sores and sore throat.   Respiratory:  Negative for cough and shortness of breath.   Cardiovascular:  Negative for chest pain and leg swelling.  Gastrointestinal:  Positive for diarrhea. Negative for abdominal pain, blood in stool, constipation, nausea and vomiting.  Genitourinary:   Negative for difficulty urinating, dysuria, frequency and hematuria.   Musculoskeletal:  Negative for arthralgias, back pain and myalgias.  Skin:  Negative for itching, rash and wound.  Neurological:  Negative for dizziness, extremity weakness, headaches, light-headedness and numbness.  Hematological:  Negative for adenopathy.  Psychiatric/Behavioral:  Negative for depression and sleep disturbance. The patient is not nervous/anxious.     VITALS:  Blood pressure 137/82, pulse 93, temperature 98.1 F (36.7 C), temperature source Oral, resp. rate 20, height _0  (1.753 m), weight 147 lb (66.7 kg), SpO2 100 %.  Wt Readings from Last 3 Encounters:  05/08/22 147 lb (66.7 kg)  05/01/22 148 lb 12.8 oz (67.5 kg)  04/18/22 159 lb 11.2 oz (72.4 kg)    Body mass index is 21.71 kg/m.  Performance status (ECOG): 1 - Symptomatic but completely ambulatory  PHYSICAL EXAM:  Physical Exam Vitals and nursing note reviewed.  Constitutional:      General: He  is not in acute distress.    Appearance: Normal appearance. He is normal weight.  HENT:     Head: Normocephalic and atraumatic.     Mouth/Throat:     Mouth: Mucous membranes are moist.     Pharynx: Oropharynx is clear. No oropharyngeal exudate or posterior oropharyngeal erythema.  Eyes:     General: No scleral icterus.    Extraocular Movements: Extraocular movements intact.     Conjunctiva/sclera: Conjunctivae normal.     Pupils: Pupils are equal, round, and reactive to light.  Cardiovascular:     Rate and Rhythm: Normal rate and regular rhythm.     Heart sounds: Normal heart sounds. No murmur heard.    No friction rub. No gallop.  Pulmonary:     Effort: Pulmonary effort is normal.     Breath sounds: Normal breath sounds. No wheezing, rhonchi or rales.  Abdominal:     General: Bowel sounds are normal. There is no distension.     Palpations: Abdomen is soft. There is no hepatomegaly, splenomegaly or mass.     Tenderness: There is no  abdominal tenderness.  Musculoskeletal:        General: Normal range of motion.     Cervical back: Normal range of motion and neck supple. No tenderness.     Right lower leg: No edema.     Left lower leg: No edema.  Lymphadenopathy:     Cervical: No cervical adenopathy.     Upper Body:     Right upper body: No supraclavicular or axillary adenopathy.     Left upper body: No supraclavicular or axillary adenopathy.     Lower Body: No right inguinal adenopathy. No left inguinal adenopathy.  Skin:    General: Skin is warm and dry.     Coloration: Skin is not jaundiced.     Findings: No rash.  Neurological:     Mental Status: He is alert and oriented to person, place, and time.     Cranial Nerves: No cranial nerve deficit.  Psychiatric:        Mood and Affect: Mood normal.        Behavior: Behavior normal.        Thought Content: Thought content normal.   LABS:      Latest Ref Rng & Units 05/08/2022   12:00 AM 05/01/2022   12:00 AM 04/10/2022   12:00 AM  CBC  WBC  18.6     16.0     7.3      Hemoglobin 13.5 - 17.5 13.7     15.7     13.7      Hematocrit 41 - 53 41     47     41      Platelets 150 - 400 K/uL 529     456     356         This result is from an external source.      Latest Ref Rng & Units 05/08/2022   12:00 AM 05/01/2022   12:00 AM 04/10/2022   12:00 AM  CMP  BUN 4 - _0 Creatinine 0.6 - 1.3 0.9     1.2     0.9      Sodium 137 - 147 132     132     140      Potassium 3.5 - 5.1 mEq/L 3.6  4.1     4.1      Chloride 99 - 108 102     94     103      CO2 13 - _0 32      Calcium 8.7 - 10.7 8.9     8.8     9.5      Total Protein 6.3 - 8.2 g/dL 6.2        Alkaline Phos 25 - 125 90     76     69      AST 14 - 40 _1 ALT 10 - 40 U/L _2 This result is from an external source.     No results found for: "CEA1", "CEA" / No results found for: "CEA1", "CEA" No results found for: "PSA1" No  results found for: "ZOX096" No results found for: "CAN125"  No results found for: "TOTALPROTELP", "ALBUMINELP", "A1GS", "A2GS", "BETS", "BETA2SER", "GAMS", "MSPIKE", "SPEI" No results found for: "TIBC", "FERRITIN", "IRONPCTSAT" Lab Results  Component Value Date   LDH 145 12/19/2021    STUDIES:  No results found.    HISTORY:   Past Medical History:  Diagnosis Date   Acute pulmonary embolism without acute cor pulmonale ([place]) 05/01/2022   Depression    mild   Family history of breast cancer 01/22/2021   Family history of prostate cancer 01/22/2021   Genital herpes    History of back injury 2003   Pruritic rash 02/18/2022   Skin cancer    melanoma    Past Surgical History:  Procedure Laterality Date   APPLICATION OF CRANIAL NAVIGATION N/A 01/03/2022   Procedure: APPLICATION OF CRANIAL NAVIGATION;  Surgeon: Consuella Lose, MD;  Location: [place];  Service: Neurosurgery;  Laterality: N/A;   CRANIOTOMY Left 01/03/2022   Procedure: Stereotactic Left temporal craniotomy for resection of tumor;  Surgeon: Consuella Lose, MD;  Location: [facility];  Service: Neurosurgery;  Laterality: Left;   MELANOMA EXCISION WITH SENTINEL LYMPH NODE BIOPSY Right 2021   SPINAL FUSION W/ LUQUE UNIT ROD  2003    Family History  Problem Relation Age of Onset   Prostate cancer Father        dx 55s   Breast cancer Maternal Aunt        dx after 72; bilateral mastectomy   Stomach cancer Maternal Grandfather        dx 77s   Breast cancer Cousin 48       maternal male cousin; mets    Social History:  reports that he quit smoking about 25 years ago. His smoking use included cigarettes. He has never used smokeless tobacco. He reports current alcohol use of about 14.0 standard drinks of alcohol per week. He reports that he does not currently use drugs.The patient is alone today.  Allergies:  Allergies  Allergen Reactions   Testosterone Itching    Gel    Sulfa Antibiotics Rash    Current  Medications: Current Outpatient Medications  Medication Sig Dispense Refill   rivaroxaban (XARELTO) 20 MG TABS tablet Take 1 tablet (20 mg total) by mouth daily with supper. Start after completing 3 weeks of Xarelto 15 mg twice daily 30 tablet 3   acyclovir (ZOVIRAX) 200 MG capsule Take by mouth.  acyclovir (ZOVIRAX) 400 MG tablet Take 400 mg by mouth 3 (three) times daily.     amitriptyline (ELAVIL) 25 MG tablet Take 50 mg by mouth at bedtime.     dexamethasone (DECADRON) 4 MG tablet Take by mouth.     ergocalciferol (VITAMIN D2) 1.25 MG (50000 UT) capsule Take 50,000 Units by mouth every Wednesday.     hydrOXYzine (ATARAX) 25 MG tablet Take 1 tablet (25 mg total) by mouth 3 (three) times daily as needed for itching. (Patient not taking: Reported on 03/31/2022) 60 tablet 3   lidocaine-prilocaine (EMLA) cream Apply 1 Application topically as needed. 30 g 0   ondansetron (ZOFRAN) 4 MG tablet Take 1 tablet (4 mg total) by mouth every 4 (four) hours as needed for nausea. (Patient not taking: Reported on 03/31/2022) 90 tablet 3   predniSONE (DELTASONE) 20 MG tablet Take 3 tablets (60 mg total) by mouth daily with breakfast. 90 tablet 1   prochlorperazine (COMPAZINE) 10 MG tablet Take 1 tablet (10 mg total) by mouth every 6 (six) hours as needed for nausea or vomiting. (Patient not taking: Reported on 03/31/2022) 90 tablet 3   Rivaroxaban (XARELTO) 15 MG TABS tablet Take 1 tablet (15 mg total) by mouth 2 (two) times daily with a meal. For 21 days, then switch to 20 mg daily (Patient not taking: Reported on 05/01/2022) 60 tablet 0   No current facility-administered medications for this visit.

## 2022-05-08 NOTE — Assessment & Plan Note (Addendum)
Acute onset severe diarrhea. Stool studies did not reveal an infectious cause. 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 Unfortunately, he has persistent diarrhea on an hourly basis, but he is only taking prednisone 40 mg daily most days. He states Imodium decreases the frequency of diarrhea.  I recommended he taking prednisone 20 mg all 3 tabs with breakfast to be sure he is treated appropriately.  I also recommended he take Imodium 4 times daily unless he has constipation. He can also use Pepto Bismol as needed.  I will plan to see him back in 1 week.  He knows to contact us if his diarrhea doesn't improve with these measures.

## 2022-05-09 ENCOUNTER — Encounter: Payer: Self-pay | Admitting: Hematology and Oncology

## 2022-05-12 NOTE — Progress Notes (Signed)
Xavier Jones  [address] [place],  [place]  [address] [phone]  Clinic Day:  05/13/2022  Referring physician: Derwood Kaplan, MD  ASSESSMENT & PLAN:   Assessment & Plan: Indeterminate colitis Acute onset severe diarrhea. Stool studies did not reveal an infectious cause. 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 Unfortunately, he has persistent diarrhea on an hourly basis, but he is only taking prednisone 40 mg daily most days. He states Imodium decreases the frequency of diarrhea. He taking prednisone 20 mg, 3 tabs daily with breakfast.  He states he still had 8 diarrhea stools yesterday, which is an improvement. He has had no diarrhea overnight or today.  For now we will continue prednisone 60 mg daily and plan to see him back in 1 week for repeat clinical assessment.  Malignant melanoma metastatic to lung [facility]) Bilateral hypermetabolic pulmonary nodules on PET in June, the largest measuring 2.3 cm, which had increased in size compared with the CT chest performed in May.  He was therefore placed on immunotherapy.  BRAF testing was negative.  CTA in September revealed pulmonary embolism and changes consistent with bronchitis.  There was a decrease in the bilateral pulmonary nodules consistent with a response to therapy. There was interval increase in hilar and mediastinal lymph nodes, but this is commonly seen with immunotherapy.  He was placed on anticoagulation and treated for bronchitis.  Prior to starting maintenance nivolumab he developed acute onset diarrhea felt to be immune mediated, so immunotherapy was placed on hold and he was started on high dose prednisone, which he continues.   Malignant melanoma metastatic to brain [facility]) Stage IIB malignant melanoma of the right posterior shoulder with recurrence in the mid back in June of this year.   Biopsy revealed metastatic malignant melanoma.  Melan-A was positive S100 was positive SOX10 was positive and PRAME positive.  Staging PET in June revealed a focal hypermetabolic lesion of the left frontoparietal lobe, concerning for brain metastasis.  MRI brain confirmed the left frontoparietal lesion and revealed several other smaller lesions.  The solitary brain metastasis was treated with surgical resection, followed by stereotactic radiosurgery.  He was then placed on palliative ipilimumab/nivolumab.  He developed left facial weakness after his third cycle.  MRI of the brain was obtained.  This revealed left temporal resection cavity demonstrates thin linear enhancement along the resection margins, which may be postoperative, with additional small areas of somewhat more nodular enhancement, which are nonspecific but could indicate recurrent disease. Interval increase in the size of the right inferior frontal gyrus enhancing lesion with increased surrounding edema, mild local mass effect, and mild local midline shift.  Stable to slightly decreased size of the left pontine lesion with decreased associated edema.  Previously noted left inferior frontal gyrus lesion is no longer visualized. No definite new lesion.  He was placed on dexamethasone 4 mg twice daily, then decreased to 4 mg daily once his neurologic symptoms resolved. After discontinuation of the dexamethasone, he resumed immunotherapy receiving a 4th and final cycle of ipilimumab/nivolumab on October 9th.  He was due to start maintenance nivolumab, but has developed severe diarrhea felt to be immune mediated, so immunotherapy is on hold. He is to follow up with Dr. Mickeal Skinner  in December.    The patient understands the plans discussed today and is in agreement with them.  He knows to contact our  office if he develops concerns prior to his next appointment.     [name], PA-C  [facility] AT  [facility] [address] [place] [address] Dept: [phone] Dept Fax: [phone]   No orders of the defined types were placed in this encounter.     CHIEF COMPLAINT:  CC: Immune mediated diarrhea  Current Treatment:  High dose prednisone  HISTORY OF PRESENT ILLNESS:   Oncology History  Melanoma of skin ([place])  12/01/2019 Initial Diagnosis   Melanoma of skin ([place])   12/05/2019 Cancer Staging   Staging form: Melanoma of the Skin, AJCC 8th Edition - Clinical stage from 12/05/2019: Stage IIA (cT3a, cN0, cM0) - Signed by Derwood Kaplan, MD on 05/30/2020   02/18/2021 Genetic Testing   No pathogenic variants detected in [facility] Multi-Cancer +RNA Panel.  Variants of uncertain significance detected in APC (c.681C>G (p.Asp227Glu)), CDKN1C (c.610C>G (p.Pro204Ala)), and FH (c.1151C>T (p.Ala384Val)).  The report date is February 18, 2021.    The Multi-Cancer + RNA Panel offered by [facility] includes sequencing and/or deletion/duplication analysis of the following 84 genes:  AIP*, ALK, APC*, ATM*, AXIN2*, BAP1*, BARD1*, BLM*, BMPR1A*, BRCA1*, BRCA2*, BRIP1*, CASR, CDC73*, CDH1*, CDK4, CDKN1B*, CDKN1C*, CDKN2A, CEBPA, CHEK2*, CTNNA1*, DICER1*, DIS3L2*, EGFR, EPCAM, FH*, FLCN*, GATA2*, GPC3, GREM1, HOXB13, HRAS, KIT, MAX*, MEN1*, MET, MITF, MLH1*, MSH2*, MSH3*, MSH6*, MUTYH*, NBN*, NF1*, NF2*, NTHL1*, PALB2*, PDGFRA, PHOX2B, PMS2*, POLD1*, POLE*, POT1*, PRKAR1A*, PTCH1*, PTEN*, RAD50*, RAD51C*, RAD51D*, RB1*, RECQL4, RET, RUNX1*, SDHA*, SDHAF2*, SDHB*, SDHC*, SDHD*, SMAD4*, SMARCA4*, SMARCB1*, SMARCE1*, STK11*, SUFU*, TERC, TERT, TMEM127*, Tp53*, TSC1*, TSC2*, VHL*, WRN*, and WT1.  RNA analysis is performed for * genes.   01/30/2022 - 02/20/2022 Chemotherapy   Patient is on Treatment Plan : MELANOMA Nivolumab + Ipilimumab (1/3) q21d / Nivolumab q28d     01/30/2022 -  Chemotherapy   Patient is on Treatment Plan : MELANOMA Nivolumab (1) + Ipilimumab (3) q21d / Nivolumab (480) q28d      Malignant melanoma metastatic to brain ([place])  12/19/2021 Initial Diagnosis   Malignant melanoma metastatic to brain ([place])   01/30/2022 - 02/20/2022 Chemotherapy   Patient is on Treatment Plan : MELANOMA Nivolumab + Ipilimumab (1/3) q21d / Nivolumab q28d     01/30/2022 -  Chemotherapy   Patient is on Treatment Plan : MELANOMA Nivolumab (1) + Ipilimumab (3) q21d / Nivolumab (480) q28d     Malignant melanoma metastatic to lung ([place])  12/19/2021 Initial Diagnosis   Malignant melanoma metastatic to lung ([place])   01/30/2022 - 02/20/2022 Chemotherapy   Patient is on Treatment Plan : MELANOMA Nivolumab + Ipilimumab (1/3) q21d / Nivolumab q28d     01/30/2022 -  Chemotherapy   Patient is on Treatment Plan : MELANOMA Nivolumab (1) + Ipilimumab (3) q21d / Nivolumab (480) q28d     03/24/2022 Imaging   CTA chest:  IMPRESSION:  1. Small nonocclusive segmental level pulmonary embolism in the  inferior LEFT upper lobe.  2. Marked bronchial wall thickening with areas of patchy basilar  opacities and new ground-glass opacities in the upper lobes.  Constellation of findings may be infectious bronchiolitis/bronchitis  though the possibility of drug related changes in the setting of  immunotherapy are also considered. Furthermore, given basilar  predominance and some material in basilar bronchial structures would  correlate with any risk factors for or history of aspiration.  3. Response to therapy of bilateral metastatic lesions. Some lymph  nodes in the chest with interval increase in  size are equivocal at  this time, potentially reactive, but warrant attention on subsequent  imaging.    Malignant neoplasm metastatic to liver ([place])  12/19/2021 Initial Diagnosis   Malignant neoplasm metastatic to liver ([place])   01/30/2022 - 02/20/2022 Chemotherapy   Patient is on Treatment Plan : MELANOMA Nivolumab + Ipilimumab (1/3) q21d / Nivolumab q28d     01/30/2022 -  Chemotherapy   Patient is on Treatment Plan :  MELANOMA Nivolumab (1) + Ipilimumab (3) q21d / Nivolumab (480) q28d         INTERVAL HISTORY:  Xavier Jones is here today for repeat clinical assessment.  He continues prednisone 20 mg tablets 3 in the morning.  He states he still had 8 diarrhea stools yesterday, but none overnight or today.  He has not taken any Imodium today.  He denies abdominal pain or blood in the stool.  He had a fall Saturday getting out of the car.  He felt his knee buckled under him and he did face.  He sustained bruising, but no lacerations.  He denies significant pain to suggest fracture.  He denies fevers or chills. He denies pain. His appetite is decreased slightly. His weight has increased 8 pounds over last week .  He continues to work.  REVIEW OF SYSTEMS:  Review of Systems  Constitutional:  Negative for appetite change, chills, fatigue, fever and unexpected weight change.  HENT:   Negative for lump/mass, mouth sores and sore throat.   Respiratory:  Negative for cough and shortness of breath.   Cardiovascular:  Negative for chest pain and leg swelling.  Gastrointestinal:  Positive for diarrhea. Negative for abdominal pain, blood in stool, constipation, nausea and vomiting.  Genitourinary:  Negative for difficulty urinating, dysuria, frequency and hematuria.   Musculoskeletal:  Negative for arthralgias, back pain and myalgias.  Skin:  Negative for itching, rash and wound.  Neurological:  Negative for dizziness, extremity weakness, headaches, light-headedness and numbness.  Hematological:  Negative for adenopathy.  Psychiatric/Behavioral:  Negative for depression and sleep disturbance. The patient is not nervous/anxious.      VITALS:  Blood pressure 131/77, pulse 100, temperature 98.5 F (36.9 C), temperature source Oral, resp. rate 20, height _0  (1.753 m), weight 155 lb 12.8 oz (70.7 kg), SpO2 99 %.  Wt Readings from Last 3 Encounters:  05/13/22 155 lb 12.8 oz (70.7 kg)  05/08/22 147 lb (66.7 kg)  05/01/22 148  lb 12.8 oz (67.5 kg)    Body mass index is 23.01 kg/m.  Performance status (ECOG): 1 - Symptomatic but completely ambulatory  PHYSICAL EXAM:  Physical Exam Vitals and nursing note reviewed.  Constitutional:      General: He is not in acute distress.    Appearance: Normal appearance. He is normal weight.  HENT:     Head: Normocephalic and atraumatic.     Comments: There is ecchymosis of the left upper cheek with slight tenderness in this area, which is resolving.    Mouth/Throat:     Mouth: Mucous membranes are moist.     Pharynx: Oropharynx is clear. No oropharyngeal exudate or posterior oropharyngeal erythema.  Eyes:     General: No scleral icterus.    Extraocular Movements: Extraocular movements intact.     Conjunctiva/sclera: Conjunctivae normal.     Pupils: Pupils are equal, round, and reactive to light.     Comments: There is ecchymosis of the left eyelid  Cardiovascular:     Rate and Rhythm: Normal rate and regular rhythm.  Heart sounds: Normal heart sounds. No murmur heard.    No friction rub. No gallop.  Pulmonary:     Effort: Pulmonary effort is normal.     Breath sounds: Normal breath sounds. No wheezing, rhonchi or rales.  Abdominal:     General: Bowel sounds are normal. There is no distension.     Palpations: Abdomen is soft. There is no hepatomegaly, splenomegaly or mass.     Tenderness: There is no abdominal tenderness.  Musculoskeletal:        General: Normal range of motion.     Cervical back: Normal range of motion and neck supple. No tenderness.     Right lower leg: No edema.     Left lower leg: No edema.  Lymphadenopathy:     Cervical: No cervical adenopathy.     Upper Body:     Right upper body: No supraclavicular or axillary adenopathy.     Left upper body: No supraclavicular or axillary adenopathy.     Lower Body: No right inguinal adenopathy. No left inguinal adenopathy.  Skin:    General: Skin is warm and dry.     Coloration: Skin is not  jaundiced.     Findings: No rash.  Neurological:     Mental Status: He is alert and oriented to person, place, and time.     Cranial Nerves: No cranial nerve deficit.  Psychiatric:        Mood and Affect: Mood normal.        Behavior: Behavior normal.        Thought Content: Thought content normal.    LABS:      Latest Ref Rng & Units 05/13/2022    1:09 PM 05/08/2022   12:00 AM 05/01/2022   12:00 AM  CBC  WBC 4.0 - 10.5 K/uL 14.0  18.6     16.0      Hemoglobin 13.0 - 17.0 g/dL 12.2  13.7     15.7      Hematocrit 39.0 - 52.0 % 37.7  41     47      Platelets 150 - 400 K/uL 456  529     456         This result is from an external source.      Latest Ref Rng & Units 05/13/2022    1:09 PM 05/08/2022   12:00 AM 05/01/2022   12:00 AM  CMP  Glucose 70 - 99 mg/dL 162     BUN 6 - 20 mg/dL _0 Creatinine 0.61 - 1.24 mg/dL 0.75  0.9     1.2      Sodium 135 - 145 mmol/L 143  132     132      Potassium 3.5 - 5.1 mmol/L 4.4  3.6     4.1      Chloride 98 - 111 mmol/L 102  102     94      CO2 22 - 32 mmol/L _1 Calcium 8.9 - 10.3 mg/dL 9.1  8.9     8.8      Total Protein 6.5 - 8.1 g/dL 5.6  6.2       Total Bilirubin 0.3 - 1.2 mg/dL 0.4     Alkaline Phos 38 - 126 U/L 64  90     76      AST  15 - 41 U/L _0 ALT 0 - 44 U/L _1 This result is from an external source.     No results found for: "CEA1", "CEA" / No results found for: "CEA1", "CEA" No results found for: "PSA1" No results found for: "HLK562" No results found for: "CAN125"  No results found for: "TOTALPROTELP", "ALBUMINELP", "A1GS", "A2GS", "BETS", "BETA2SER", "GAMS", "MSPIKE", "SPEI" No results found for: "TIBC", "FERRITIN", "IRONPCTSAT" Lab Results  Component Value Date   LDH 145 12/19/2021    STUDIES:  No results found.    HISTORY:   Past Medical History:  Diagnosis Date   Acute pulmonary embolism without acute cor pulmonale ([place]) 05/01/2022    Depression    mild   Family history of breast cancer 01/22/2021   Family history of prostate cancer 01/22/2021   Genital herpes    History of back injury 2003   Pruritic rash 02/18/2022   Skin cancer    melanoma    Past Surgical History:  Procedure Laterality Date   APPLICATION OF CRANIAL NAVIGATION N/A 01/03/2022   Procedure: APPLICATION OF CRANIAL NAVIGATION;  Surgeon: Consuella Lose, MD;  Location: [place];  Service: Neurosurgery;  Laterality: N/A;   CRANIOTOMY Left 01/03/2022   Procedure: Stereotactic Left temporal craniotomy for resection of tumor;  Surgeon: Consuella Lose, MD;  Location: [place];  Service: Neurosurgery;  Laterality: Left;   MELANOMA EXCISION WITH SENTINEL LYMPH NODE BIOPSY Right 2021   SPINAL FUSION W/ LUQUE UNIT ROD  2003    Family History  Problem Relation Age of Onset   Prostate cancer Father        dx 79s   Breast cancer Maternal Aunt        dx after 31; bilateral mastectomy   Stomach cancer Maternal Grandfather        dx 47s   Breast cancer Cousin 16       maternal male cousin; mets    Social History:  reports that he quit smoking about 25 years ago. His smoking use included cigarettes. He has never used smokeless tobacco. He reports current alcohol use of about 14.0 standard drinks of alcohol per week. He reports that he does not currently use drugs.The patient is alone today.  Allergies:  Allergies  Allergen Reactions   Testosterone Itching    Gel    Sulfa Antibiotics Rash    Current Medications: Current Outpatient Medications  Medication Sig Dispense Refill   acyclovir (ZOVIRAX) 200 MG capsule Take by mouth.     acyclovir (ZOVIRAX) 400 MG tablet Take 400 mg by mouth 3 (three) times daily.     amitriptyline (ELAVIL) 25 MG tablet Take 50 mg by mouth at bedtime.     dexamethasone (DECADRON) 4 MG tablet Take by mouth.     ergocalciferol (VITAMIN D2) 1.25 MG (50000 UT) capsule Take 50,000 Units by mouth every Wednesday.     hydrOXYzine  (ATARAX) 25 MG tablet Take 1 tablet (25 mg total) by mouth 3 (three) times daily as needed for itching. (Patient not taking: Reported on 03/31/2022) 60 tablet 3   lidocaine-prilocaine (EMLA) cream Apply 1 Application topically as needed. 30 g 0   ondansetron (ZOFRAN) 4 MG tablet Take 1 tablet (4 mg total) by mouth every 4 (four) hours as needed for nausea. (Patient not taking: Reported on 03/31/2022) 90 tablet 3  predniSONE (DELTASONE) 20 MG tablet Take 3 tablets (60 mg total) by mouth daily with breakfast. 90 tablet 1   prochlorperazine (COMPAZINE) 10 MG tablet Take 1 tablet (10 mg total) by mouth every 6 (six) hours as needed for nausea or vomiting. (Patient not taking: Reported on 03/31/2022) 90 tablet 3   Rivaroxaban (XARELTO) 15 MG TABS tablet Take 1 tablet (15 mg total) by mouth 2 (two) times daily with a meal. For 21 days, then switch to 20 mg daily (Patient not taking: Reported on 05/01/2022) 60 tablet 0   rivaroxaban (XARELTO) 20 MG TABS tablet Take 1 tablet (20 mg total) by mouth daily with supper. Start after completing 3 weeks of Xarelto 15 mg twice daily 30 tablet 3   No current facility-administered medications for this visit.

## 2022-05-12 NOTE — Assessment & Plan Note (Signed)
Stage IIB malignant melanoma of the right posterior shoulder with recurrence in the mid back in June of this year.  Biopsy revealed metastatic malignant melanoma.  Melan-A was positive S100 was positive SOX10 was positive and PRAME positive.  Staging PET in June revealed a focal hypermetabolic lesion of the left frontoparietal lobe, concerning for brain metastasis.  MRI brain confirmed the left frontoparietal lesion and revealed several other smaller lesions.  The solitary brain metastasis was treated with surgical resection, followed by stereotactic radiosurgery.  He was then placed on palliative ipilimumab/nivolumab.  He developed left facial weakness after his third cycle.  MRI of the brain was obtained.  This revealed left temporal resection cavity demonstrates thin linear enhancement along the resection margins, which may be postoperative, with additional small areas of somewhat more nodular enhancement, which are nonspecific but could indicate recurrent disease. Interval increase in the size of the right inferior frontal gyrus enhancing lesion with increased surrounding edema, mild local mass effect, and mild local midline shift.  Stable to slightly decreased size of the left pontine lesion with decreased associated edema.  Previously noted left inferior frontal gyrus lesion is no longer visualized. No definite new lesion.  He was placed on dexamethasone 4 mg twice daily, then decreased to 4 mg daily once his neurologic symptoms resolved. After discontinuation of the dexamethasone, he resumed immunotherapy receiving a 4th and final cycle of ipilimumab/nivolumab on October 9th.  He was due to start maintenance nivolumab, but has developed severe diarrhea felt to be immune mediated, so immunotherapy is on hold. He is to follow up with Dr. Mickeal Skinner  in December.

## 2022-05-12 NOTE — Assessment & Plan Note (Signed)
Bilateral hypermetabolic pulmonary nodules on PET in June, the largest measuring 2.3 cm, which had increased in size compared with the CT chest performed in May.  He was therefore placed on immunotherapy.  BRAF testing was negative.  CTA in September revealed pulmonary embolism and changes consistent with bronchitis.  There was a decrease in the bilateral pulmonary nodules consistent with a response to therapy. There was interval increase in hilar and mediastinal lymph nodes, but this is commonly seen with immunotherapy.  He was placed on anticoagulation and treated for bronchitis.  Prior to starting maintenance nivolumab he developed acute onset diarrhea felt to be immune mediated, so immunotherapy was placed on hold and he was started on high dose prednisone, which he continues.

## 2022-05-12 NOTE — Assessment & Plan Note (Signed)
Acute onset severe diarrhea. Stool studies did not reveal an infectious cause. 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 Unfortunately, he has persistent diarrhea on an hourly basis, but he is only taking prednisone 40 mg daily most days. He states Imodium decreases the frequency of diarrhea. He taking prednisone 20 mg, 3 tabs daily with breakfast.  He states he still had 8 diarrhea stools yesterday, which is an improvement. He has had no diarrhea overnight or today.  For now we will continue prednisone 60 mg daily and plan to see him back in 1 week for repeat clinical assessment.

## 2022-05-13 ENCOUNTER — Encounter: Payer: Self-pay | Admitting: Hematology and Oncology

## 2022-05-13 ENCOUNTER — Inpatient Hospital Stay (INDEPENDENT_AMBULATORY_CARE_PROVIDER_SITE_OTHER): Payer: Medicare Other | Admitting: Hematology and Oncology

## 2022-05-13 ENCOUNTER — Inpatient Hospital Stay: Payer: Medicare Other

## 2022-05-13 VITALS — BP 131/77 | HR 100 | Temp 98.5°F | Resp 20 | Ht 69.0 in | Wt 155.8 lb

## 2022-05-13 DIAGNOSIS — Z79899 Other long term (current) drug therapy: Secondary | ICD-10-CM | POA: Diagnosis not present

## 2022-05-13 DIAGNOSIS — C7931 Secondary malignant neoplasm of brain: Secondary | ICD-10-CM

## 2022-05-13 DIAGNOSIS — C439 Malignant melanoma of skin, unspecified: Secondary | ICD-10-CM

## 2022-05-13 DIAGNOSIS — C7800 Secondary malignant neoplasm of unspecified lung: Secondary | ICD-10-CM | POA: Diagnosis not present

## 2022-05-13 DIAGNOSIS — C7801 Secondary malignant neoplasm of right lung: Secondary | ICD-10-CM | POA: Diagnosis not present

## 2022-05-13 DIAGNOSIS — K523 Indeterminate colitis: Secondary | ICD-10-CM

## 2022-05-13 DIAGNOSIS — C787 Secondary malignant neoplasm of liver and intrahepatic bile duct: Secondary | ICD-10-CM | POA: Diagnosis not present

## 2022-05-13 DIAGNOSIS — Z5112 Encounter for antineoplastic immunotherapy: Secondary | ICD-10-CM | POA: Diagnosis not present

## 2022-05-13 DIAGNOSIS — C7802 Secondary malignant neoplasm of left lung: Secondary | ICD-10-CM | POA: Diagnosis not present

## 2022-05-13 DIAGNOSIS — C4361 Malignant melanoma of right upper limb, including shoulder: Secondary | ICD-10-CM | POA: Diagnosis present

## 2022-05-13 LAB — CBC WITH DIFFERENTIAL (CANCER CENTER ONLY)
Abs Immature Granulocytes: 0.24 10*3/uL — ABNORMAL HIGH (ref 0.00–0.07)
Basophils Absolute: 0.1 10*3/uL (ref 0.0–0.1)
Basophils Relative: 0 %
Eosinophils Absolute: 0 10*3/uL (ref 0.0–0.5)
Eosinophils Relative: 0 %
HCT: 37.7 % — ABNORMAL LOW (ref 39.0–52.0)
Hemoglobin: 12.2 g/dL — ABNORMAL LOW (ref 13.0–17.0)
Immature Granulocytes: 2 %
Lymphocytes Relative: 7 %
Lymphs Abs: 1 10*3/uL (ref 0.7–4.0)
MCH: 30.6 pg (ref 26.0–34.0)
MCHC: 32.4 g/dL (ref 30.0–36.0)
MCV: 94.5 fL (ref 80.0–100.0)
Monocytes Absolute: 1 10*3/uL (ref 0.1–1.0)
Monocytes Relative: 7 %
Neutro Abs: 11.8 10*3/uL — ABNORMAL HIGH (ref 1.7–7.7)
Neutrophils Relative %: 84 %
Platelet Count: 456 10*3/uL — ABNORMAL HIGH (ref 150–400)
RBC: 3.99 MIL/uL — ABNORMAL LOW (ref 4.22–5.81)
RDW: 15.4 % (ref 11.5–15.5)
WBC Count: 14 10*3/uL — ABNORMAL HIGH (ref 4.0–10.5)
nRBC: 0 % (ref 0.0–0.2)

## 2022-05-13 LAB — CMP (CANCER CENTER ONLY)
ALT: 16 U/L (ref 0–44)
AST: 15 U/L (ref 15–41)
Albumin: 2.8 g/dL — ABNORMAL LOW (ref 3.5–5.0)
Alkaline Phosphatase: 64 U/L (ref 38–126)
Anion gap: 13 (ref 5–15)
BUN: 11 mg/dL (ref 6–20)
CO2: 28 mmol/L (ref 22–32)
Calcium: 9.1 mg/dL (ref 8.9–10.3)
Chloride: 102 mmol/L (ref 98–111)
Creatinine: 0.75 mg/dL (ref 0.61–1.24)
GFR, Estimated: >60 mL/min (ref >60)
Glucose, Bld: 162 mg/dL — ABNORMAL HIGH (ref 70–99)
Potassium: 4.4 mmol/L (ref 3.5–5.1)
Sodium: 143 mmol/L (ref 135–145)
Total Bilirubin: 0.4 mg/dL (ref 0.3–1.2)
Total Protein: 5.6 g/dL — ABNORMAL LOW (ref 6.5–8.1)

## 2022-05-13 LAB — TSH: TSH: 0.612 u[IU]/mL (ref 0.350–4.500)

## 2022-05-14 ENCOUNTER — Telehealth: Payer: Self-pay

## 2022-05-14 NOTE — Telephone Encounter (Signed)
Patient notified

## 2022-05-14 NOTE — Telephone Encounter (Signed)
-----   Message from [name], PA-C sent at 05/14/2022  1:25 PM EST ----- Please let him know his blood counts are stable, except he is more anemic. Also, his sugar is up from the steroids. Will just watch for now. Thanks

## 2022-05-15 LAB — T4: T4, Total: 7.8 ug/dL (ref 4.5–12.0)

## 2022-05-19 ENCOUNTER — Ambulatory Visit: Payer: Medicare Other

## 2022-05-19 DIAGNOSIS — Z8042 Family history of malignant neoplasm of prostate: Secondary | ICD-10-CM | POA: Diagnosis not present

## 2022-05-19 DIAGNOSIS — N3289 Other specified disorders of bladder: Secondary | ICD-10-CM | POA: Diagnosis not present

## 2022-05-19 DIAGNOSIS — N401 Enlarged prostate with lower urinary tract symptoms: Secondary | ICD-10-CM | POA: Diagnosis not present

## 2022-05-20 ENCOUNTER — Inpatient Hospital Stay (INDEPENDENT_AMBULATORY_CARE_PROVIDER_SITE_OTHER): Payer: Medicare Other | Admitting: Hematology and Oncology

## 2022-05-20 ENCOUNTER — Inpatient Hospital Stay: Payer: Medicare Other

## 2022-05-20 ENCOUNTER — Encounter: Payer: Self-pay | Admitting: Hematology and Oncology

## 2022-05-20 VITALS — BP 141/88 | HR 110 | Temp 98.2°F | Resp 18 | Ht 69.0 in | Wt 153.7 lb

## 2022-05-20 DIAGNOSIS — C7931 Secondary malignant neoplasm of brain: Secondary | ICD-10-CM | POA: Diagnosis not present

## 2022-05-20 DIAGNOSIS — D649 Anemia, unspecified: Secondary | ICD-10-CM

## 2022-05-20 DIAGNOSIS — I2699 Other pulmonary embolism without acute cor pulmonale: Secondary | ICD-10-CM | POA: Diagnosis not present

## 2022-05-20 DIAGNOSIS — Z5112 Encounter for antineoplastic immunotherapy: Secondary | ICD-10-CM | POA: Diagnosis not present

## 2022-05-20 DIAGNOSIS — C7801 Secondary malignant neoplasm of right lung: Secondary | ICD-10-CM | POA: Diagnosis not present

## 2022-05-20 DIAGNOSIS — C4361 Malignant melanoma of right upper limb, including shoulder: Secondary | ICD-10-CM | POA: Diagnosis not present

## 2022-05-20 DIAGNOSIS — C787 Secondary malignant neoplasm of liver and intrahepatic bile duct: Secondary | ICD-10-CM

## 2022-05-20 DIAGNOSIS — C439 Malignant melanoma of skin, unspecified: Secondary | ICD-10-CM

## 2022-05-20 DIAGNOSIS — C7800 Secondary malignant neoplasm of unspecified lung: Secondary | ICD-10-CM

## 2022-05-20 DIAGNOSIS — C7802 Secondary malignant neoplasm of left lung: Secondary | ICD-10-CM | POA: Diagnosis not present

## 2022-05-20 DIAGNOSIS — K523 Indeterminate colitis: Secondary | ICD-10-CM | POA: Diagnosis not present

## 2022-05-20 HISTORY — DX: Anemia, unspecified: D64.9

## 2022-05-20 LAB — CBC WITH DIFFERENTIAL (CANCER CENTER ONLY)
Abs Immature Granulocytes: 0.07 10*3/uL (ref 0.00–0.07)
Basophils Absolute: 0 10*3/uL (ref 0.0–0.1)
Basophils Relative: 1 %
Eosinophils Absolute: 0 10*3/uL (ref 0.0–0.5)
Eosinophils Relative: 0 %
HCT: 36.9 % — ABNORMAL LOW (ref 39.0–52.0)
Hemoglobin: 11.9 g/dL — ABNORMAL LOW (ref 13.0–17.0)
Immature Granulocytes: 1 %
Lymphocytes Relative: 14 %
Lymphs Abs: 1 10*3/uL (ref 0.7–4.0)
MCH: 30.7 pg (ref 26.0–34.0)
MCHC: 32.2 g/dL (ref 30.0–36.0)
MCV: 95.3 fL (ref 80.0–100.0)
Monocytes Absolute: 0.8 10*3/uL (ref 0.1–1.0)
Monocytes Relative: 11 %
Neutro Abs: 5.4 10*3/uL (ref 1.7–7.7)
Neutrophils Relative %: 73 %
Platelet Count: 386 10*3/uL (ref 150–400)
RBC: 3.87 MIL/uL — ABNORMAL LOW (ref 4.22–5.81)
RDW: 16.7 % — ABNORMAL HIGH (ref 11.5–15.5)
WBC Count: 7.4 10*3/uL (ref 4.0–10.5)
nRBC: 0 % (ref 0.0–0.2)

## 2022-05-20 LAB — CMP (CANCER CENTER ONLY)
ALT: 17 U/L (ref 0–44)
AST: 16 U/L (ref 15–41)
Albumin: 3.1 g/dL — ABNORMAL LOW (ref 3.5–5.0)
Alkaline Phosphatase: 60 U/L (ref 38–126)
Anion gap: 6 (ref 5–15)
BUN: 15 mg/dL (ref 6–20)
CO2: 29 mmol/L (ref 22–32)
Calcium: 8.9 mg/dL (ref 8.9–10.3)
Chloride: 106 mmol/L (ref 98–111)
Creatinine: 0.82 mg/dL (ref 0.61–1.24)
GFR, Estimated: >60 mL/min (ref >60)
Glucose, Bld: 169 mg/dL — ABNORMAL HIGH (ref 70–99)
Potassium: 4 mmol/L (ref 3.5–5.1)
Sodium: 141 mmol/L (ref 135–145)
Total Bilirubin: 0.1 mg/dL — ABNORMAL LOW (ref 0.3–1.2)
Total Protein: 6.1 g/dL — ABNORMAL LOW (ref 6.5–8.1)

## 2022-05-20 LAB — TSH: TSH: 0.773 u[IU]/mL (ref 0.350–4.500)

## 2022-05-20 NOTE — Assessment & Plan Note (Signed)
Non occlusive segmental pulmonary embolism in the inferior left upper lobe diagnosed in September.  He states he completed his prescription for rivaroxaban 15 g twice daily and is now taking rivaroxaban 20 mg daily.  He denies abnormal bruising or bleeding.

## 2022-05-20 NOTE — Assessment & Plan Note (Addendum)
He has worsening anemia despite being off therapy.  His thyroid function is in normal range. I will check for an nutritional deficiencies or evidence of hemolysis.

## 2022-05-20 NOTE — Assessment & Plan Note (Addendum)
Acute onset severe diarrhea. Stool studies did not reveal an infectious cause. 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  Initially, he had persistent diarrhea on an hourly basis, but he was only taking prednisone 40 mg daily most days.  I asked him to increase this to 60 mg at breakfast and he did have improvement in the diarrhea, but continued to use Imodium, so I did not decrease his prednisone this week. He tells me today he is only taking prednisone 20 mg daily with breakfast as he was having swelling of his feet with the higher dose.  He states he is having 1 watery stool per day, but does continue Imodium daily.  He continues to deny abdominal plain or blood in the stool.  Normally, we would not decrease his prednisone at this rate, but as long as he does not have recurrent severe diarrhea, I will have him continue prednisone 20 mg daily.  He knows to contact us if he has worsening diarrhea.  We will plan to see him back in 1 week for repeat clinical assessment.

## 2022-05-20 NOTE — Assessment & Plan Note (Signed)
Bilateral hypermetabolic pulmonary nodules on PET in June, the largest measuring 2.3 cm, which had increased in size compared with the CT chest performed in May.  He was therefore placed on immunotherapy with ipilimumab/nivolumab and received 4 cycles.  BRAF testing was negative.  CTA in September revealed pulmonary embolism and changes consistent with bronchitis.  There was a decrease in the bilateral pulmonary nodules consistent with a response to therapy. There was interval increase in hilar and mediastinal lymph nodes, but this is commonly seen with immunotherapy.  He was placed on anticoagulation and treated for bronchitis.  Prior to starting maintenance nivolumab he developed acute onset diarrhea felt to be immune mediated, so immunotherapy was placed on hold and he was started on high dose prednisone, which he continues.

## 2022-05-20 NOTE — Progress Notes (Signed)
[facility]  [address] [place],  [place]  [address] [phone]  Clinic Day:  05/20/2022  Referring physician: Earlyne Iba, NP  ASSESSMENT & PLAN:   Assessment & Plan: Indeterminate colitis Acute onset severe diarrhea. Stool studies did not reveal an infectious cause. 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  Initially, he had persistent diarrhea on an hourly basis, but he was only taking prednisone 40 mg daily most days.  I asked him to increase this to 60 mg at breakfast and he did have improvement in the diarrhea, but continued to use Imodium, so I did not decrease his prednisone this week. He tells me today he is only taking prednisone 20 mg daily with breakfast as he was having swelling of his feet with the higher dose.  He states he is having 1 watery stool per day, but does continue Imodium daily.  He continues to deny abdominal plain or blood in the stool.  Normally, we would not decrease his prednisone at this rate, but as long as he does not have recurrent severe diarrhea, I will have him continue prednisone 20 mg daily.  He knows to contact us if he has worsening diarrhea.  We will plan to see him back in 1 week for repeat clinical assessment.  Malignant melanoma metastatic to lung [facility]) Bilateral hypermetabolic pulmonary nodules on PET in June, the largest measuring 2.3 cm, which had increased in size compared with the CT chest performed in May.  He was therefore placed on immunotherapy with ipilimumab/nivolumab and received 4 cycles.  BRAF testing was negative.  CTA in September revealed pulmonary embolism and changes consistent with bronchitis.  There was a decrease in the bilateral pulmonary nodules consistent with a response to therapy. There was interval increase in hilar and mediastinal lymph nodes, but this is commonly seen with  immunotherapy.  He was placed on anticoagulation and treated for bronchitis.  Prior to starting maintenance nivolumab he developed acute onset diarrhea felt to be immune mediated, so immunotherapy was placed on hold and he was started on high dose prednisone, which he continues.   Acute pulmonary embolism without acute cor pulmonale (HCC) Non occlusive segmental pulmonary embolism in the inferior left upper lobe diagnosed in September.  He states he completed his prescription for rivaroxaban 15 g twice daily and is now taking rivaroxaban 20 mg daily.  He denies abnormal bruising or bleeding.  Anemia He has worsening anemia despite being off therapy.  His thyroid function is in normal range. I will check for an nutritional deficiencies or evidence of hemolysis.    The patient understands the plans discussed today and is in agreement with them.  He knows to contact our office if he develops concerns prior to his next appointment.    [name], PA-C  [facility] [address] [place] [address] Dept: [phone] Dept Fax: [phone]   Orders Placed This Encounter  Procedures   Ferritin    Standing Status:   Future    Standing Expiration Date:   05/21/2023   Folate    Standing Status:   Future    Standing Expiration Date:   05/21/2023   Iron and TIBC    Standing Status:   Future    Standing Expiration Date:   05/21/2023   Vitamin B12  Standing Status:   Future    Standing Expiration Date:   05/21/2023   Lactate dehydrogenase    Standing Status:   Future    Standing Expiration Date:   05/21/2023      CHIEF COMPLAINT:  CC: Metastatic melanoma  Current Treatment: Immunotherapy on hold  HISTORY OF PRESENT ILLNESS:   Oncology History  Melanoma of skin ([place])  12/01/2019 Initial Diagnosis   Melanoma of skin ([place])   12/05/2019 Cancer Staging   Staging form: Melanoma of the Skin, AJCC 8th Edition -  Clinical stage from 12/05/2019: Stage IIA (cT3a, cN0, cM0) - Signed by Derwood Kaplan, MD on 05/30/2020   02/18/2021 Genetic Testing   No pathogenic variants detected in [facility] Multi-Cancer +RNA Panel.  Variants of uncertain significance detected in APC (c.681C>G (p.Asp227Glu)), CDKN1C (c.610C>G (p.Pro204Ala)), and FH (c.1151C>T (p.Ala384Val)).  The report date is February 18, 2021.    The Multi-Cancer + RNA Panel offered by [facility] includes sequencing and/or deletion/duplication analysis of the following 84 genes:  AIP*, ALK, APC*, ATM*, AXIN2*, BAP1*, BARD1*, BLM*, BMPR1A*, BRCA1*, BRCA2*, BRIP1*, CASR, CDC73*, CDH1*, CDK4, CDKN1B*, CDKN1C*, CDKN2A, CEBPA, CHEK2*, CTNNA1*, DICER1*, DIS3L2*, EGFR, EPCAM, FH*, FLCN*, GATA2*, GPC3, GREM1, HOXB13, HRAS, KIT, MAX*, MEN1*, MET, MITF, MLH1*, MSH2*, MSH3*, MSH6*, MUTYH*, NBN*, NF1*, NF2*, NTHL1*, PALB2*, PDGFRA, PHOX2B, PMS2*, POLD1*, POLE*, POT1*, PRKAR1A*, PTCH1*, PTEN*, RAD50*, RAD51C*, RAD51D*, RB1*, RECQL4, RET, RUNX1*, SDHA*, SDHAF2*, SDHB*, SDHC*, SDHD*, SMAD4*, SMARCA4*, SMARCB1*, SMARCE1*, STK11*, SUFU*, TERC, TERT, TMEM127*, Tp53*, TSC1*, TSC2*, VHL*, WRN*, and WT1.  RNA analysis is performed for * genes.   01/30/2022 - 02/20/2022 Chemotherapy   Patient is on Treatment Plan : MELANOMA Nivolumab + Ipilimumab (1/3) q21d / Nivolumab q28d     01/30/2022 -  Chemotherapy   Patient is on Treatment Plan : MELANOMA Nivolumab (1) + Ipilimumab (3) q21d / Nivolumab (480) q28d     Malignant melanoma metastatic to brain ([place])  12/19/2021 Initial Diagnosis   Malignant melanoma metastatic to brain ([place])   01/30/2022 - 02/20/2022 Chemotherapy   Patient is on Treatment Plan : MELANOMA Nivolumab + Ipilimumab (1/3) q21d / Nivolumab q28d     01/30/2022 -  Chemotherapy   Patient is on Treatment Plan : MELANOMA Nivolumab (1) + Ipilimumab (3) q21d / Nivolumab (480) q28d     Malignant melanoma metastatic to lung ([place])  12/19/2021 Initial Diagnosis   Malignant  melanoma metastatic to lung ([place])   01/30/2022 - 02/20/2022 Chemotherapy   Patient is on Treatment Plan : MELANOMA Nivolumab + Ipilimumab (1/3) q21d / Nivolumab q28d     01/30/2022 -  Chemotherapy   Patient is on Treatment Plan : MELANOMA Nivolumab (1) + Ipilimumab (3) q21d / Nivolumab (480) q28d     03/24/2022 Imaging   CTA chest:  IMPRESSION:  1. Small nonocclusive segmental level pulmonary embolism in the  inferior LEFT upper lobe.  2. Marked bronchial wall thickening with areas of patchy basilar  opacities and new ground-glass opacities in the upper lobes.  Constellation of findings may be infectious bronchiolitis/bronchitis  though the possibility of drug related changes in the setting of  immunotherapy are also considered. Furthermore, given basilar  predominance and some material in basilar bronchial structures would  correlate with any risk factors for or history of aspiration.  3. Response to therapy of bilateral metastatic lesions. Some lymph  nodes in the chest with interval increase in size are equivocal at  this time, potentially reactive, but warrant attention on subsequent  imaging.    Malignant  neoplasm metastatic to liver ([place])  12/19/2021 Initial Diagnosis   Malignant neoplasm metastatic to liver ([place])   01/30/2022 - 02/20/2022 Chemotherapy   Patient is on Treatment Plan : MELANOMA Nivolumab + Ipilimumab (1/3) q21d / Nivolumab q28d     01/30/2022 -  Chemotherapy   Patient is on Treatment Plan : MELANOMA Nivolumab (1) + Ipilimumab (3) q21d / Nivolumab (480) q28d         INTERVAL HISTORY:  Xavier Jones is here today for repeat clinical assessment and states he is only taking prednisone 20 mg daily.  He states he had swelling of his feet that made his shoes uncomfortable with the higher dose.  He reports only 1 diarrhea stool a day, but does continue allowing him daily.  He denies abdominal pain or blood in stool.  He remains fatigued.  He denies fevers or chills.  His  appetite is good. His weight has decreased 2 pounds over last week .  He continues to work.  REVIEW OF SYSTEMS:  Review of Systems  Constitutional:  Positive for fatigue. Negative for appetite change, chills, fever and unexpected weight change.  HENT:   Negative for lump/mass, mouth sores and sore throat.   Respiratory:  Negative for cough and shortness of breath.   Cardiovascular:  Negative for chest pain and leg swelling.  Gastrointestinal:  Positive for diarrhea. Negative for abdominal pain, blood in stool, constipation, nausea and vomiting.  Genitourinary:  Negative for difficulty urinating, dysuria, frequency and hematuria.   Musculoskeletal:  Negative for arthralgias, back pain and myalgias.  Skin:  Negative for itching, rash and wound.  Neurological:  Negative for dizziness, extremity weakness, headaches, light-headedness and numbness.  Hematological:  Negative for adenopathy.  Psychiatric/Behavioral:  Negative for depression and sleep disturbance. The patient is not nervous/anxious.      VITALS:  Blood pressure (!) 141/88, pulse (!) 110, temperature 98.2 F (36.8 C), temperature source Oral, resp. rate 18, height _0  (1.753 m), weight 153 lb 11.2 oz (69.7 kg), SpO2 100 %.  Wt Readings from Last 3 Encounters:  05/20/22 153 lb 11.2 oz (69.7 kg)  05/13/22 155 lb 12.8 oz (70.7 kg)  05/08/22 147 lb (66.7 kg)    Body mass index is 22.7 kg/m.  Performance status (ECOG): 1 - Symptomatic but completely ambulatory  PHYSICAL EXAM:  Physical Exam Vitals and nursing note reviewed.  Constitutional:      General: He is not in acute distress.    Appearance: Normal appearance. He is normal weight.  HENT:     Head: Normocephalic and atraumatic.     Mouth/Throat:     Mouth: Mucous membranes are moist.     Pharynx: Oropharynx is clear. No oropharyngeal exudate or posterior oropharyngeal erythema.  Eyes:     General: No scleral icterus.    Extraocular Movements: Extraocular movements  intact.     Conjunctiva/sclera: Conjunctivae normal.     Pupils: Pupils are equal, round, and reactive to light.  Cardiovascular:     Rate and Rhythm: Normal rate and regular rhythm.     Heart sounds: Normal heart sounds. No murmur heard.    No friction rub. No gallop.  Pulmonary:     Effort: Pulmonary effort is normal.     Breath sounds: Normal breath sounds. No wheezing, rhonchi or rales.  Abdominal:     General: Bowel sounds are normal. There is no distension.     Palpations: Abdomen is soft. There is no hepatomegaly, splenomegaly or mass.  Tenderness: There is no abdominal tenderness.  Musculoskeletal:        General: Normal range of motion.     Cervical back: Normal range of motion and neck supple. No tenderness.     Right lower leg: Edema (Trace) present.     Left lower leg: Edema (Trace) present.  Lymphadenopathy:     Cervical: No cervical adenopathy.     Upper Body:     Right upper body: No supraclavicular or axillary adenopathy.     Left upper body: No supraclavicular or axillary adenopathy.     Lower Body: No right inguinal adenopathy. No left inguinal adenopathy.  Skin:    General: Skin is warm and dry.     Coloration: Skin is not jaundiced.     Findings: No rash.  Neurological:     Mental Status: He is alert and oriented to person, place, and time.     Cranial Nerves: No cranial nerve deficit.  Psychiatric:        Mood and Affect: Mood normal.        Behavior: Behavior normal.        Thought Content: Thought content normal.     LABS:      Latest Ref Rng & Units 05/20/2022    1:41 PM 05/13/2022    1:09 PM 05/08/2022   12:00 AM  CBC  WBC 4.0 - 10.5 K/uL 7.4  14.0  18.6      Hemoglobin 13.0 - 17.0 g/dL 11.9  12.2  13.7      Hematocrit 39.0 - 52.0 % 36.9  37.7  41      Platelets 150 - 400 K/uL 386  456  529         This result is from an external source.      Latest Ref Rng & Units 05/20/2022    1:41 PM 05/13/2022    1:09 PM 05/08/2022   12:00 AM  CMP   Glucose 70 - 99 mg/dL 169  162    BUN 6 - 20 mg/dL _0 Creatinine 0.61 - 1.24 mg/dL 0.82  0.75  0.9      Sodium 135 - 145 mmol/L 141  143  132      Potassium 3.5 - 5.1 mmol/L 4.0  4.4  3.6      Chloride 98 - 111 mmol/L 106  102  102      CO2 22 - 32 mmol/L _1 Calcium 8.9 - 10.3 mg/dL 8.9  9.1  8.9      Total Protein 6.5 - 8.1 g/dL 6.1  5.6  6.2      Total Bilirubin 0.3 - 1.2 mg/dL 0.1  0.4    Alkaline Phos 38 - 126 U/L 60  64  90      AST 15 - 41 U/L _2 ALT 0 - 44 U/L _3 This result is from an external source.     No results found for: "CEA1", "CEA" / No results found for: "CEA1", "CEA" No results found for: "PSA1" No results found for: "KGM010" No results found for: "CAN125"  No results found for: "TOTALPROTELP", "ALBUMINELP", "A1GS", "A2GS", "BETS", "BETA2SER", "GAMS", "MSPIKE", "SPEI" No results found for: "TIBC", "FERRITIN", "IRONPCTSAT" Lab Results  Component Value Date   LDH 145 12/19/2021  STUDIES:  No results found.    HISTORY:   Past Medical History:  Diagnosis Date   Acute pulmonary embolism without acute cor pulmonale ([place]) 05/01/2022   Anemia 05/20/2022   Depression    mild   Family history of breast cancer 01/22/2021   Family history of prostate cancer 01/22/2021   Genital herpes    History of back injury 2003   Pruritic rash 02/18/2022   Skin cancer    melanoma    Past Surgical History:  Procedure Laterality Date   APPLICATION OF CRANIAL NAVIGATION N/A 01/03/2022   Procedure: APPLICATION OF CRANIAL NAVIGATION;  Surgeon: Consuella Lose, MD;  Location: [facility];  Service: Neurosurgery;  Laterality: N/A;   CRANIOTOMY Left 01/03/2022   Procedure: Stereotactic Left temporal craniotomy for resection of tumor;  Surgeon: Consuella Lose, MD;  Location: [facility];  Service: Neurosurgery;  Laterality: Left;   MELANOMA EXCISION WITH SENTINEL LYMPH NODE BIOPSY Right 2021   SPINAL FUSION W/ LUQUE UNIT  ROD  2003    Family History  Problem Relation Age of Onset   Prostate cancer Father        dx 27s   Breast cancer Maternal Aunt        dx after 57; bilateral mastectomy   Stomach cancer Maternal Grandfather        dx 49s   Breast cancer Cousin 10       maternal male cousin; mets    Social History:  reports that he quit smoking about 25 years ago. His smoking use included cigarettes. He has never used smokeless tobacco. He reports current alcohol use of about 14.0 standard drinks of alcohol per week. He reports that he does not currently use drugs.The patient is alone today.  Allergies:  Allergies  Allergen Reactions   Testosterone Itching    Gel    Sulfa Antibiotics Rash    Current Medications: Current Outpatient Medications  Medication Sig Dispense Refill   tamsulosin (FLOMAX) 0.4 MG CAPS capsule Take 0.4 mg by mouth daily.     acyclovir (ZOVIRAX) 200 MG capsule Take by mouth.     acyclovir (ZOVIRAX) 400 MG tablet Take 400 mg by mouth 3 (three) times daily.     amitriptyline (ELAVIL) 25 MG tablet Take 50 mg by mouth at bedtime.     dexamethasone (DECADRON) 4 MG tablet Take by mouth.     ergocalciferol (VITAMIN D2) 1.25 MG (50000 UT) capsule Take 50,000 Units by mouth every Wednesday.     hydrOXYzine (ATARAX) 25 MG tablet Take 1 tablet (25 mg total) by mouth 3 (three) times daily as needed for itching. (Patient not taking: Reported on 03/31/2022) 60 tablet 3   lidocaine-prilocaine (EMLA) cream Apply 1 Application topically as needed. 30 g 0   ondansetron (ZOFRAN) 4 MG tablet Take 1 tablet (4 mg total) by mouth every 4 (four) hours as needed for nausea. (Patient not taking: Reported on 03/31/2022) 90 tablet 3   predniSONE (DELTASONE) 20 MG tablet Take 3 tablets (60 mg total) by mouth daily with breakfast. (Patient taking differently: Take 60 mg by mouth daily with breakfast. Reports taking one tablet once a day provider aware 05/20/2022) 90 tablet 1   prochlorperazine (COMPAZINE)  10 MG tablet Take 1 tablet (10 mg total) by mouth every 6 (six) hours as needed for nausea or vomiting. (Patient not taking: Reported on 03/31/2022) 90 tablet 3   Rivaroxaban (XARELTO) 15 MG TABS tablet Take 1 tablet (15 mg total) by mouth 2 (two)  times daily with a meal. For 21 days, then switch to 20 mg daily (Patient not taking: Reported on 05/01/2022) 60 tablet 0   rivaroxaban (XARELTO) 20 MG TABS tablet Take 1 tablet (20 mg total) by mouth daily with supper. Start after completing 3 weeks of Xarelto 15 mg twice daily 30 tablet 3   No current facility-administered medications for this visit.

## 2022-05-21 ENCOUNTER — Telehealth: Payer: Self-pay

## 2022-05-21 DIAGNOSIS — C4361 Malignant melanoma of right upper limb, including shoulder: Secondary | ICD-10-CM | POA: Diagnosis not present

## 2022-05-21 DIAGNOSIS — C7802 Secondary malignant neoplasm of left lung: Secondary | ICD-10-CM | POA: Diagnosis not present

## 2022-05-21 DIAGNOSIS — C787 Secondary malignant neoplasm of liver and intrahepatic bile duct: Secondary | ICD-10-CM | POA: Diagnosis not present

## 2022-05-21 DIAGNOSIS — C7801 Secondary malignant neoplasm of right lung: Secondary | ICD-10-CM | POA: Diagnosis not present

## 2022-05-21 DIAGNOSIS — C7931 Secondary malignant neoplasm of brain: Secondary | ICD-10-CM | POA: Diagnosis not present

## 2022-05-21 DIAGNOSIS — Z5112 Encounter for antineoplastic immunotherapy: Secondary | ICD-10-CM | POA: Diagnosis not present

## 2022-05-21 LAB — IRON AND TIBC
Iron: 52 ug/dL (ref 45–182)
Saturation Ratios: 18 % (ref 17.9–39.5)
TIBC: 297 ug/dL (ref 250–450)
UIBC: 245 ug/dL

## 2022-05-21 LAB — FERRITIN: Ferritin: 41 ng/mL (ref 24–336)

## 2022-05-21 LAB — VITAMIN B12: Vitamin B-12: 260 pg/mL (ref 180–914)

## 2022-05-21 LAB — FOLATE: Folate: 7.7 ng/mL (ref >5.9)

## 2022-05-21 LAB — LACTATE DEHYDROGENASE: LDH: 139 U/L (ref 98–192)

## 2022-05-21 NOTE — Telephone Encounter (Signed)
Patient aware and voiced understanding

## 2022-05-21 NOTE — Telephone Encounter (Signed)
-----   Message from [name], PA-C sent at 05/21/2022  8:17 AM EST ----- Please let him know his labs are pretty stable. Thanks

## 2022-05-22 LAB — T4: T4, Total: 9.1 ug/dL (ref 4.5–12.0)

## 2022-05-27 NOTE — Progress Notes (Shared)
Patient Care Team: Earlyne Iba, NP as PCP - General (Nurse Practitioner) Derwood Kaplan, MD as Consulting Physician (Oncology) Gatha Mayer, MD as Consulting Physician (Radiation Oncology)  Clinic Day: 05/27/22   Referring physician: Derwood Kaplan, MD  ASSESSMENT & PLAN:   Assessment & Plan: Melanoma of skin ([place]) Stage IIB malignant melanoma of the right posterior shoulder, Breslow's depth 3.3 mm, Clark level IV, treated with wide local excision, diagnosed in May 2021.  There was still a question as to whether this represented a metastatic lesion, rather than a primary, based on the presence of 2 nodules and no epidermal involvement.  MRI head and CT chest, abdomen and pelvis in June were negative for metastatic disease.   No adjuvant therapy was recommended.  PET scan in September was negative for metastatic disease. Chest x-ray in June 2022 was negative.   Metastasis to subcutaneous tissue  He presented with a new area of concern to the left mid back in June of this year. A superficial nodule was noted approximately 1 cm to the right of his upper thoracic spine,and it had increased in size. He was sent for biopsy and that was performed on June 13 and confirmed metastatic malignant melanoma.   He has several hypermetabolic subcutaneous nodules seen on PET scan.  Metastasis to liver He has a small hypermetabolic liver lesion which is new and less than 1 cm but suspicious for metastasis.  Metastasis to lymph nodes He has multiple hypermetabolic nodes of the retroperitoneal and right pelvic area as well as right external iliac and a few additional nodes.  Pulmonary metastases He has bilateral hypermetabolic pulmonary nodules which are increased in size compared with the CT chest performed in May of this year.  The largest nodule is up to 2.3 cm in diameter.  Metastases to brain This was suspicious on the PET scan in the left frontoparietal lobe and so he had an MRI of  the brain.  This confirmed a 2.6 cm heterogeneous lesion of the left superior temporal gyrus with mild enhancement and extensive surrounding edema.  He also has an 8 mm cortical lesion of the inferomedial right frontal lobe with mild edema, a 2 mm mildly enhancing cortical lesion of the inferolateral left frontal lobe and a intrinsically T1 hyperintense lesion of the left lateral pons measuring 7 mm with surrounding edema.  He has 4 lesions in total but 3 are less than 1 cm.  He has received stereotactic radiation. He had neurologic symptoms in September and was placed on dexamethasone. Scans did show some cerebral edema so he was slowly tapered off.    Family history of breast cancer Maternal family history of breast cancer in addition to his melanoma history which raises suspicion for possible BRCA mutation.  He met with the genetic counselors and [facility] RNA + hereditary cancer panel revealed variants of uncertain significance (VUS) of the APC, CDKN1C and FH genes.  Urinary symptoms This is new and described as incontinence but also urinary hesitancy and nocturia x 2. I will refer him to Dr. Nila Nephew for evaluation.     I will refer him to Dr. Nila Nephew for evaluation of his urinary symptoms.  He is relatively young but this could be BPH or irritable bladder, hopefully not related to his metastatic malignant melanoma. His last treatment was October 9th and that completed his 4 cycles of combined immunotherapy. We will plan to see him back in 2 weeks with CBC and CMP for repeat evaluation before  his first maintenance nivolumab, every 2 weeks at first and then every 4 weeks. The patient understands the plans discussed today and is in agreement with them.  I have answered his questions and reviewed this information in detail.The patient knows to contact our office if he develops concerns prior to his next appointment.    Conard Novak  [facility] AT  [facility] [address] [place] [address] Dept: [phone] Dept Fax: [phone]   No orders of the defined types were placed in this encounter.     CHIEF COMPLAINT:  CC: Metastatic melanoma  Current Treatment: Corticosteroids and brain irradiation, followed by immunotherapy  INTERVAL HISTORY:  Cuthbert is here today for repeat clinical assessment.        His 4th dose of combined immunotherapy was October 9th. He states he was having trouble with his face describing it as loss of muscle control. His most recent MRI scan reveled some swelling. He was placed on steroids but was improving prior to it. He has now tapered off the dexamethasone. He states he is still working full time. As for treatment, he states he is doing fair, but he does have a generalized maculopapular rash of his scalp, forehead, anterior chest which is pruritic and likely from the treatment. His appetite is good. His weight is stable since his last visit. He denies pain. CBC and CMP are essentially normal. Dr.Vaslow is involved in his case and will see him in December with repeat MRI scan.   I have reviewed the past medical history, past surgical history, social history and family history with the patient and they are unchanged from previous note.  ALLERGIES:  is allergic to testosterone and sulfa antibiotics.  MEDICATIONS:  Current Outpatient Medications  Medication Sig Dispense Refill   acyclovir (ZOVIRAX) 200 MG capsule Take by mouth.     acyclovir (ZOVIRAX) 400 MG tablet Take 400 mg by mouth 3 (three) times daily.     amitriptyline (ELAVIL) 25 MG tablet Take 50 mg by mouth at bedtime.     dexamethasone (DECADRON) 4 MG tablet Take by mouth.     ergocalciferol (VITAMIN D2) 1.25 MG (50000 UT) capsule Take 50,000 Units by mouth every Wednesday.     hydrOXYzine (ATARAX) 25 MG tablet Take 1 tablet (25 mg total) by mouth 3 (three) times daily as needed for itching. (Patient not taking: Reported on  03/31/2022) 60 tablet 3   lidocaine-prilocaine (EMLA) cream Apply 1 Application topically as needed. 30 g 0   ondansetron (ZOFRAN) 4 MG tablet Take 1 tablet (4 mg total) by mouth every 4 (four) hours as needed for nausea. (Patient not taking: Reported on 03/31/2022) 90 tablet 3   predniSONE (DELTASONE) 20 MG tablet Take 3 tablets (60 mg total) by mouth daily with breakfast. (Patient taking differently: Take 60 mg by mouth daily with breakfast. Reports taking one tablet once a day provider aware 05/20/2022) 90 tablet 1   prochlorperazine (COMPAZINE) 10 MG tablet Take 1 tablet (10 mg total) by mouth every 6 (six) hours as needed for nausea or vomiting. (Patient not taking: Reported on 03/31/2022) 90 tablet 3   Rivaroxaban (XARELTO) 15 MG TABS tablet Take 1 tablet (15 mg total) by mouth 2 (two) times daily with a meal. For 21 days, then switch to 20 mg daily (Patient not taking: Reported on 05/01/2022) 60 tablet 0   rivaroxaban (XARELTO) 20 MG TABS tablet Take 1 tablet (20 mg total) by mouth daily  with supper. Start after completing 3 weeks of Xarelto 15 mg twice daily 30 tablet 3   tamsulosin (FLOMAX) 0.4 MG CAPS capsule Take 0.4 mg by mouth daily.     No current facility-administered medications for this visit.    HISTORY OF PRESENT ILLNESS:   Oncology History  Melanoma of skin ([place])  12/01/2019 Initial Diagnosis   Melanoma of skin ([place])   12/05/2019 Cancer Staging   Staging form: Melanoma of the Skin, AJCC 8th Edition - Clinical stage from 12/05/2019: Stage IIA (cT3a, cN0, cM0) - Signed by Derwood Kaplan, MD on 05/30/2020   02/18/2021 Genetic Testing   No pathogenic variants detected in [facility] Multi-Cancer +RNA Panel.  Variants of uncertain significance detected in APC (c.681C>G (p.Asp227Glu)), CDKN1C (c.610C>G (p.Pro204Ala)), and FH (c.1151C>T (p.Ala384Val)).  The report date is February 18, 2021.    The Multi-Cancer + RNA Panel offered by [facility] includes sequencing and/or  deletion/duplication analysis of the following 84 genes:  AIP*, ALK, APC*, ATM*, AXIN2*, BAP1*, BARD1*, BLM*, BMPR1A*, BRCA1*, BRCA2*, BRIP1*, CASR, CDC73*, CDH1*, CDK4, CDKN1B*, CDKN1C*, CDKN2A, CEBPA, CHEK2*, CTNNA1*, DICER1*, DIS3L2*, EGFR, EPCAM, FH*, FLCN*, GATA2*, GPC3, GREM1, HOXB13, HRAS, KIT, MAX*, MEN1*, MET, MITF, MLH1*, MSH2*, MSH3*, MSH6*, MUTYH*, NBN*, NF1*, NF2*, NTHL1*, PALB2*, PDGFRA, PHOX2B, PMS2*, POLD1*, POLE*, POT1*, PRKAR1A*, PTCH1*, PTEN*, RAD50*, RAD51C*, RAD51D*, RB1*, RECQL4, RET, RUNX1*, SDHA*, SDHAF2*, SDHB*, SDHC*, SDHD*, SMAD4*, SMARCA4*, SMARCB1*, SMARCE1*, STK11*, SUFU*, TERC, TERT, TMEM127*, Tp53*, TSC1*, TSC2*, VHL*, WRN*, and WT1.  RNA analysis is performed for * genes.   01/30/2022 - 02/20/2022 Chemotherapy   Patient is on Treatment Plan : MELANOMA Nivolumab + Ipilimumab (1/3) q21d / Nivolumab q28d     01/30/2022 -  Chemotherapy   Patient is on Treatment Plan : MELANOMA Nivolumab (1) + Ipilimumab (3) q21d / Nivolumab (480) q28d     Malignant melanoma metastatic to brain ([place])  12/19/2021 Initial Diagnosis   Malignant melanoma metastatic to brain ([place])   01/30/2022 - 02/20/2022 Chemotherapy   Patient is on Treatment Plan : MELANOMA Nivolumab + Ipilimumab (1/3) q21d / Nivolumab q28d     01/30/2022 -  Chemotherapy   Patient is on Treatment Plan : MELANOMA Nivolumab (1) + Ipilimumab (3) q21d / Nivolumab (480) q28d     Malignant melanoma metastatic to lung ([place])  12/19/2021 Initial Diagnosis   Malignant melanoma metastatic to lung ([place])   01/30/2022 - 02/20/2022 Chemotherapy   Patient is on Treatment Plan : MELANOMA Nivolumab + Ipilimumab (1/3) q21d / Nivolumab q28d     01/30/2022 -  Chemotherapy   Patient is on Treatment Plan : MELANOMA Nivolumab (1) + Ipilimumab (3) q21d / Nivolumab (480) q28d     03/24/2022 Imaging   CTA chest:  IMPRESSION:  1. Small nonocclusive segmental level pulmonary embolism in the  inferior LEFT upper lobe.  2. Marked bronchial wall  thickening with areas of patchy basilar  opacities and new ground-glass opacities in the upper lobes.  Constellation of findings may be infectious bronchiolitis/bronchitis  though the possibility of drug related changes in the setting of  immunotherapy are also considered. Furthermore, given basilar  predominance and some material in basilar bronchial structures would  correlate with any risk factors for or history of aspiration.  3. Response to therapy of bilateral metastatic lesions. Some lymph  nodes in the chest with interval increase in size are equivocal at  this time, potentially reactive, but warrant attention on subsequent  imaging.    Malignant neoplasm metastatic to liver ([place])  12/19/2021 Initial Diagnosis  Malignant neoplasm metastatic to liver [facility])   01/30/2022 - 02/20/2022 Chemotherapy   Patient is on Treatment Plan : MELANOMA Nivolumab + Ipilimumab (1/3) q21d / Nivolumab q28d     01/30/2022 -  Chemotherapy   Patient is on Treatment Plan : MELANOMA Nivolumab (1) + Ipilimumab (3) q21d / Nivolumab (480) q28d         REVIEW OF SYSTEMS:   Constitutional: Denies fevers, chills or abnormal weight loss Eyes: Denies blurriness of vision Ears, nose, mouth, throat, and face: Denies mucositis or sore throat Respiratory: Denies cough, dyspnea or wheezes Cardiovascular: Denies palpitation, chest discomfort or lower extremity swelling Gastrointestinal:  Denies nausea, heartburn or change in bowel habits Skin: Denies abnormal skin rashes Lymphatics: Denies new lymphadenopathy or easy bruising Neurological:Denies numbness, tingling or new weaknesses Behavioral/Psych: Mood is stable, no new changes  All other systems were reviewed with the patient and are negative.   VITALS:  There were no vitals taken for this visit.  Wt Readings from Last 3 Encounters:  05/20/22 153 lb 11.2 oz (69.7 kg)  05/13/22 155 lb 12.8 oz (70.7 kg)  05/08/22 147 lb (66.7 kg)    There is no height or  weight on file to calculate BMI.  Performance status (ECOG): 1 - Symptomatic but completely ambulatory  PHYSICAL EXAM:   GENERAL:alert, no distress and comfortable SKIN: skin color, texture, turgor are normal, no rashes or significant lesions.  He has an additional nodule of the left flank which is smaller but firm. EYES: normal, Conjunctiva are pink and non-injected, sclera clear OROPHARYNX:no exudate, no erythema and lips, buccal mucosa, and tongue normal  NECK: supple, thyroid normal size, non-tender, without nodularity LYMPH:  no palpable lymphadenopathy in the cervical, axillary or inguinal LUNGS: clear to auscultation and percussion with normal breathing effort Occasional wheeze all lung feels except upper lobe HEART: regular rate & rhythm with tachycardia, and no lower extremity edema ABDOMEN:abdomen soft, non-tender and normal bowel sounds Musculoskeletal:no cyanosis of digits and no clubbing  NEURO: alert & oriented x 3 with fluent speech, no focal motor/sensory deficits Port colon right upper chest maculopapular rash surrounding superior portion. Looks like allergic rash  Large scars bilateral lower back, posteriorly. Mass in upper thoracic posterior area measuring 4 x 4 cm ,  firm   LABORATORY DATA:  I have reviewed the data as listed    Component Value Date/Time   NA 141 05/20/2022 1341   NA 132 (A) 05/08/2022 0000   K 4.0 05/20/2022 1341   CL 106 05/20/2022 1341   CO2 29 05/20/2022 1341   GLUCOSE 169 (H) 05/20/2022 1341   BUN 15 05/20/2022 1341   BUN 11 05/08/2022 0000   CREATININE 0.82 05/20/2022 1341   CALCIUM 8.9 05/20/2022 1341   PROT 6.1 (L) 05/20/2022 1341   PROT 6.2 (A) 05/08/2022 0000   ALBUMIN 3.1 (L) 05/20/2022 1341   AST 16 05/20/2022 1341   ALT 17 05/20/2022 1341   ALKPHOS 60 05/20/2022 1341   BILITOT 0.1 (L) 05/20/2022 1341   GFRNONAA >60 05/20/2022 1341    No results found for: "SPEP", "UPEP"  Lab Results  Component Value Date   WBC 7.4  05/20/2022   NEUTROABS 5.4 05/20/2022   HGB 11.9 (L) 05/20/2022   HCT 36.9 (L) 05/20/2022   MCV 95.3 05/20/2022   PLT 386 05/20/2022      Chemistry      Component Value Date/Time   NA 141 05/20/2022 1341   NA 132 (A) 05/08/2022 0000  K 4.0 05/20/2022 1341   CL 106 05/20/2022 1341   CO2 29 05/20/2022 1341   BUN 15 05/20/2022 1341   BUN 11 05/08/2022 0000   CREATININE 0.82 05/20/2022 1341   GLU 78 05/08/2022 0000      Component Value Date/Time   CALCIUM 8.9 05/20/2022 1341   ALKPHOS 60 05/20/2022 1341   AST 16 05/20/2022 1341   ALT 17 05/20/2022 1341   BILITOT 0.1 (L) 05/20/2022 1341       RADIOGRAPHIC STUDIES: I have personally reviewed the radiological images as listed and agreed with the findings in the report. No results found.  EXAM:03/24/22 CT ANGIOGRAPHY CHEST WITH CONTRAST IMPRESSION: Small nonocclusive segmental level pulmonary embolism in the inferior LEFT upper lobe. Marked bronchial wall thickening with areas of patchy basilar opacities and new ground glass opacities in the upper lobes. Constellation of findings may be infectious bronchiolitis/bronchitis though the possibility of drug related changes in the setting of immunotherapy are also considered. Furthermore, given basilar predominance and some material in basilar bronchial structures would correlate with any risk factors for or history of aspiration.  Response to therapy of bilateral metastatic lesions. Some lymph nodes in the chest with interval increase in size are equivocal at this time, potentially reactive, but warrant attention on subsequent imaging.   EXAM:03/24/22 MRI HEAD WITHOUT AND WITH CONTRAST CONTRAST:  7.1 mL [employer]   COMPARISON:  01/04/2022   FINDINGS: Brain: Status post left pterional craniotomy and subjacent resection cavity, which demonstrates some intrinsic T1 hyperintense signal along the margin (series 600, image 178) and some thin linear enhancement (series 1100, image 187).  Additional small areas of somewhat more nodular enhancement (series 1100, image 184). Improved associated edema. Heterogeneous fluid in the resection cavity.   Right inferior frontal gyrus enhancing lesion measures 15 x 11 x 10 mm (series 1100, image 170 and series 10, image 20), previously 10 x 9 x 9 mm when remeasured similarly. Increased associated edema with 5 mm right-to-left midline shift at the level of the inferior frontal lobes, previously 3 mm.   Previously noted left inferior frontal gyrus lesion no longer demonstrates enhancement.   Lesion in the left aspect of the pons near the 7/8 cranial nerve root entry zone measures 11 x 8 x 8 mm (series 1100, image 136 and series 10, image 23), previously 11 x 10 x 9 mm. Stable to slightly decreased associated edema.   No definite new lesion. Small focus of enhancement in the left parietal lobe (series 1100, image 230) is not associated with any edema and may be vascular. No evidence of acute infarct, hemorrhage, mass effect, or midline shift. No hydrocephalus.   Vascular: Normal arterial flow voids.   Skull and upper cervical spine: Left pterional craniotomy. Otherwise normal marrow signal.   Sinuses/Orbits: No acute finding.   Other: The mastoids are well aerated.   IMPRESSION: 1. Left temporal resection cavity demonstrates thin linear enhancement along the resection margins, which may be postoperative, with additional small areas of somewhat more nodular enhancement, which are nonspecific but could indicate recurrent disease. Attention on follow-up. 2. Interval increase in the size of the right inferior frontal gyrus enhancing lesion with increased surrounding edema, mild local mass effect, and mild local midline shift. 3. Stable to slightly decreased size of the left pontine lesion with decreased associated edema. 4. Previously noted left inferior frontal gyrus lesion is no longer visualized. 5. No definite new  lesion.  EXAM : 01/04/2022 MRI BRAIN W WO CONTRAST  FINDINGS: Brain: New left side craniotomy. Smooth new widespread left hemisphere pachymeningeal thickening is probably postoperative in nature (series 11, image 17). Posterior left temporal/operculum resection cavity with heterogeneous internal contents, both T1 intrinsic and isointense. Some evidence of marginal diffusion restriction (series 2, image 27). No definite enhancement following contrast. Regional vasogenic edema and mass effect are mildly reduced.   T1 intrinsic left brainstem metastasis near the 7th/8th cranial nerve root entry zone is stable at 10 mm. Regional brainstem and left cerebellar peduncle edema does appear mildly increased on series 6, image 9.   Right inferior frontal gyrus T1 hypointense and enhancing metastasis is stable measuring 10 mm (series 10, image 41). Stable mild regional edema.   Nearby small contralateral left inferior frontal gyrus metastasis measuring 3-4 mm is stable on series 10, image 41, no edema.   No new brain metastasis identified.   Stable ventricle size and configuration. No other restricted diffusion. Cervicomedullary junction and pituitary are within normal limits. Normal basilar cisterns. Occasional small nonspecific cerebral white matter T2 and FLAIR hyperintense foci are stable.   Vascular: Major intracranial vascular flow voids are stable. The major dural venous sinuses are enhancing and appear to be patent.   Skull and upper cervical spine: Craniotomy. Negative visible cervical spine and spinal cord. Visualized bone marrow signal is within normal limits.   Sinuses/Orbits: Stable and negative.   Other: Mastoids are clear. Visible internal auditory structures appear normal. Postoperative changes to the left scalp.   IMPRESSION: 1. New left side craniotomy and gross total resection of left temporal/operculum metastasis. Mild resection margin ischemia is possible.  Stable regional edema and mass effect. Mild smooth left hemisphere meningeal thickening is likely postoperative in nature.   2. The other three smaller brain metastases are stable, aside from mildly increased associated brainstem and left cerebellar peduncle edema.   3. No new brain metastasis identified.     EXAM: 12/27/2021 MRI BRAIN W WO CONTRAST  FINDINGS: Brain: There are 4 unchanged intraparenchymal masses:   1. Left temporal lobe, 3.0 cm series 11, image 85 2. Left frontal lobe 3 mm, image 79 3. Paramedian right frontal lobe 8 mm, image 75 4. Left middle cerebellar peduncle, 9 mm, image 41.   Edema surrounding the left temporal lesion is unchanged with regional mass effect but no midline shift. Evidence of chronic hemorrhage within the left temporal and left cerebellar peduncular lesion. No new lesions. No acute infarct. No extra-axial collection.   Vascular: Normal flow voids.   Skull and upper cervical spine: Normal marrow signal.   Sinuses/Orbits: Negative.   Other: None   IMPRESSION: Unchanged size and appearance of 4 intraparenchymal metastases. No new lesions.     I,Gabriella Ballesteros,acting as a scribe for Derwood Kaplan, MD.,have documented all relevant documentation on the behalf of Derwood Kaplan, MD,as directed by  Derwood Kaplan, MD while in the presence of Derwood Kaplan, MD.

## 2022-05-28 ENCOUNTER — Encounter: Payer: Self-pay | Admitting: Oncology

## 2022-05-28 ENCOUNTER — Other Ambulatory Visit: Payer: Self-pay | Admitting: Oncology

## 2022-05-28 ENCOUNTER — Inpatient Hospital Stay: Payer: Medicare Other

## 2022-05-28 ENCOUNTER — Inpatient Hospital Stay (INDEPENDENT_AMBULATORY_CARE_PROVIDER_SITE_OTHER): Payer: Medicare Other | Admitting: Oncology

## 2022-05-28 VITALS — BP 123/82 | HR 101 | Temp 98.4°F | Resp 18 | Ht 69.0 in | Wt 155.2 lb

## 2022-05-28 DIAGNOSIS — C787 Secondary malignant neoplasm of liver and intrahepatic bile duct: Secondary | ICD-10-CM

## 2022-05-28 DIAGNOSIS — C439 Malignant melanoma of skin, unspecified: Secondary | ICD-10-CM | POA: Diagnosis not present

## 2022-05-28 DIAGNOSIS — C7800 Secondary malignant neoplasm of unspecified lung: Secondary | ICD-10-CM

## 2022-05-28 DIAGNOSIS — C7802 Secondary malignant neoplasm of left lung: Secondary | ICD-10-CM | POA: Diagnosis not present

## 2022-05-28 DIAGNOSIS — C7931 Secondary malignant neoplasm of brain: Secondary | ICD-10-CM | POA: Diagnosis not present

## 2022-05-28 DIAGNOSIS — Z5112 Encounter for antineoplastic immunotherapy: Secondary | ICD-10-CM | POA: Diagnosis not present

## 2022-05-28 DIAGNOSIS — C7801 Secondary malignant neoplasm of right lung: Secondary | ICD-10-CM | POA: Diagnosis not present

## 2022-05-28 DIAGNOSIS — L282 Other prurigo: Secondary | ICD-10-CM

## 2022-05-28 DIAGNOSIS — C4361 Malignant melanoma of right upper limb, including shoulder: Secondary | ICD-10-CM | POA: Diagnosis not present

## 2022-05-28 LAB — CBC WITH DIFFERENTIAL (CANCER CENTER ONLY)
Abs Immature Granulocytes: 0.12 10*3/uL — ABNORMAL HIGH (ref 0.00–0.07)
Basophils Absolute: 0.1 10*3/uL (ref 0.0–0.1)
Basophils Relative: 1 %
Eosinophils Absolute: 0.3 10*3/uL (ref 0.0–0.5)
Eosinophils Relative: 4 %
HCT: 39.1 % (ref 39.0–52.0)
Hemoglobin: 12.4 g/dL — ABNORMAL LOW (ref 13.0–17.0)
Immature Granulocytes: 2 %
Lymphocytes Relative: 18 %
Lymphs Abs: 1.4 10*3/uL (ref 0.7–4.0)
MCH: 31 pg (ref 26.0–34.0)
MCHC: 31.7 g/dL (ref 30.0–36.0)
MCV: 97.8 fL (ref 80.0–100.0)
Monocytes Absolute: 0.8 10*3/uL (ref 0.1–1.0)
Monocytes Relative: 10 %
Neutro Abs: 5.1 10*3/uL (ref 1.7–7.7)
Neutrophils Relative %: 65 %
Platelet Count: 471 10*3/uL — ABNORMAL HIGH (ref 150–400)
RBC: 4 MIL/uL — ABNORMAL LOW (ref 4.22–5.81)
RDW: 17.5 % — ABNORMAL HIGH (ref 11.5–15.5)
WBC Count: 7.8 10*3/uL (ref 4.0–10.5)
nRBC: 0 % (ref 0.0–0.2)

## 2022-05-28 LAB — CMP (CANCER CENTER ONLY)
ALT: 18 U/L (ref 0–44)
AST: 17 U/L (ref 15–41)
Albumin: 3.4 g/dL — ABNORMAL LOW (ref 3.5–5.0)
Alkaline Phosphatase: 59 U/L (ref 38–126)
Anion gap: 6 (ref 5–15)
BUN: 12 mg/dL (ref 6–20)
CO2: 29 mmol/L (ref 22–32)
Calcium: 9.2 mg/dL (ref 8.9–10.3)
Chloride: 105 mmol/L (ref 98–111)
Creatinine: 0.74 mg/dL (ref 0.61–1.24)
GFR, Estimated: >60 mL/min
Glucose, Bld: 83 mg/dL (ref 70–99)
Potassium: 4 mmol/L (ref 3.5–5.1)
Sodium: 140 mmol/L (ref 135–145)
Total Bilirubin: 0.3 mg/dL (ref 0.3–1.2)
Total Protein: 6.7 g/dL (ref 6.5–8.1)

## 2022-05-28 LAB — TSH: TSH: 1.276 u[IU]/mL (ref 0.350–4.500)

## 2022-05-28 MED ORDER — PREDNISONE 10 MG PO TABS: 10.0000 mg | ORAL_TABLET | Freq: Every day | ORAL | 2 refills | Status: DC

## 2022-05-28 MED FILL — Nivolumab IV Soln 240 MG/24ML: INTRAVENOUS | Qty: 24 | Status: AC

## 2022-05-28 NOTE — Progress Notes (Signed)
Patient Care Team: Earlyne Iba, NP as PCP - General (Nurse Practitioner) Derwood Kaplan, MD as Consulting Physician (Oncology) Gatha Mayer, MD as Consulting Physician (Radiation Oncology)  Clinic Day: 05/28/22  Referring physician: Derwood Kaplan, MD  ASSESSMENT & PLAN:   Assessment & Plan: Melanoma of skin ([place]) Stage IIB malignant melanoma of the right posterior shoulder, Breslow's depth 3.3 mm, Clark level IV, treated with wide local excision, diagnosed in May 2021.  There was still a question as to whether this represented a metastatic lesion, rather than a primary, based on the presence of 2 nodules and no epidermal involvement.  MRI head and CT chest, abdomen and pelvis in June were negative for metastatic disease.   No adjuvant therapy was recommended.  PET scan in September of 2021 was negative for metastatic disease. Chest x-ray in June 2022 was negative.   Metastasis to subcutaneous tissue  He presented with a new area of concern to the left mid back in June of this year. A superficial nodule was noted approximately 1 cm to the right of his upper thoracic spine,and it had increased in size. He was sent for biopsy and that was performed on June 13 and confirmed metastatic malignant melanoma.   He had several hypermetabolic subcutaneous nodules seen on PET scan which were resected.  Metastasis to liver He has a small hypermetabolic liver lesion which is new and less than 1 cm but suspicious for metastasis.  Metastasis to lymph nodes He has multiple hypermetabolic nodes of the retroperitoneal and right pelvic area as well as right external iliac and a few additional nodes.  Pulmonary metastases He has bilateral hypermetabolic pulmonary nodules which are increased in size compared with the CT chest performed in May of this year.  The largest nodule is up to 2.3 cm in diameter. Repeat CTA chest in September showed relatively stable nodules. We will repeat CT chest  abdomen pelvis after he has been back on treatment for a while.  Metastases to brain This was suspicious on the PET scan in the left frontoparietal lobe and so he had an MRI of the brain.  This confirmed a 2.6 cm heterogeneous lesion of the left superior temporal gyrus with mild enhancement and extensive surrounding edema.  He also has an 8 mm cortical lesion of the inferomedial right frontal lobe with mild edema, a 2 mm mildly enhancing cortical lesion of the inferolateral left frontal lobe and a intrinsically T1 hyperintense lesion of the left lateral pons measuring 7 mm with surrounding edema.  He has 4 lesions in total but 3 are less than 1 cm.  He has received stereotactic radiation. He had neurologic symptoms in September and was placed on dexamethasone. Scans did show some cerebral edema so he was slowly tapered off. MRI brain from 03/24/22 showed: Left temporal resection cavity with thin linear enhancement along the resection margins, which may be postoperative, with additional small areas of somewhat more nodular enhancement, which are nonspecific but could indicate recurrent disease. He will have repeat MRI brain in mid December.   Family history of breast cancer Maternal family history of breast cancer in addition to his melanoma history which raises suspicion for possible BRCA mutation.  He met with the genetic counselors and [facility] RNA + hereditary cancer panel revealed variants of uncertain significance (VUS) of the APC, CDKN1C and FH genes.  Urinary symptoms This is described as incontinence but also urinary hesitancy and nocturia x 2. Dr. Nila Nephew started him on Flomax  which he feels worsened his symptoms. I advised him to follow up with Dr. Nila Nephew for this.   Plan: Dr. Nila Nephew started him on Flomax with worsening of his symptoms. He is relatively young but this could be BPH or irritable bladder, hopefully not related to his metastatic malignant melanoma. I advised him to follow up with Dr. Nila Nephew.  He is scheduled for his 5th cycle of Nivolumab which will be on 06/02/22. He has had recurrent generalized itching since stopping Prednisone last week. However, his leg swelling has significantly improved. He also had symptoms of colitis with severe diarrhea which responded to the Prednisone. Reviewed with pharmacy who recommended resuming Prednisone 39m daily. If his itching worsens we may discontinue Nivolumab. Pt will follow up with Kelli in 1 week from tomorrow with CBC and CMP. He will follow up with me 1 week later. The patient understands the plans discussed today and is in agreement with them.  I have answered his questions and reviewed this information in detail.The patient knows to contact our office if he develops concerns prior to his next appointment.    CDerwood Kaplan MD  CFitzgerald[address] StreetSWhite [id number]: [phone] Fax: [phone]  No orders of the defined types were placed in this encounter.     CHIEF COMPLAINT:  CC: Metastatic melanoma  Current Treatment: Corticosteroids and brain irradiation, followed by immunotherapy  INTERVAL HISTORY:  Xavier Jones here today for repeat clinical assessment. His 5th cycle of single agent Nivolumab will be on 06/02/22. He states that he has been tapered off of Prednisone for about a week. He has not yet had a treatment since discontinuing the Prednisone. He reports generalized itching while off of the steroid. However, he did have severe BLE swelling while on Prednisone which has improved. He denies any rashes. He had a lung CT in September which was fairly stable. Dr. VMickeal Skinneris involved in his case and will see him in December with repeat MRI of the brain. He complains of moderate intermittent left-sided headaches as well. He denies any new bumps or lumps. He denies signs of infection such as sore throat, sinus drainage, or cough.  He denies  fevers or recurrent chills. He denies nausea, vomiting, chest pain, dyspnea or cough. He denies any bowel changes or GI upset. His weight has been stable. He was evaluated by Dr. CNila Nephewfor his urinary hesitancy and nocturia. He was started on Flomax which he feels worsened his symptoms. He has discontinued this. I advised him to call Dr. CAra Kussmauloffice to schedule follow up.   I have reviewed the past medical history, past surgical history, social history and family history with the patient and they are unchanged from previous note.  ALLERGIES:  is allergic to testosterone and sulfa antibiotics.  MEDICATIONS:  Current Outpatient Medications  Medication Sig Dispense Refill   acyclovir (ZOVIRAX) 200 MG capsule Take by mouth. (Patient not taking: Reported on 06/05/2022)     acyclovir (ZOVIRAX) 400 MG tablet Take 400 mg by mouth 3 (three) times daily. Taking once daily per patient     amitriptyline (ELAVIL) 25 MG tablet Take 50 mg by mouth at bedtime.     ergocalciferol (VITAMIN D2) 1.25 MG (50000 UT) capsule Take 50,000 Units by mouth every Wednesday.     hydrOXYzine (ATARAX) 25 MG tablet Take 1 tablet (25 mg total) by mouth 3 (three) times daily as needed for itching. (  Patient not taking: Reported on 03/31/2022) 60 tablet 3   lidocaine-prilocaine (EMLA) cream Apply 1 Application topically as needed. 30 g 0   ondansetron (ZOFRAN) 4 MG tablet Take 1 tablet (4 mg total) by mouth every 4 (four) hours as needed for nausea. (Patient not taking: Reported on 03/31/2022) 90 tablet 3   predniSONE (DELTASONE) 10 MG tablet Take 1 tablet (10 mg total) by mouth daily with breakfast. 30 tablet 2   prochlorperazine (COMPAZINE) 10 MG tablet Take 1 tablet (10 mg total) by mouth every 6 (six) hours as needed for nausea or vomiting. (Patient not taking: Reported on 03/31/2022) 90 tablet 3   rivaroxaban (XARELTO) 20 MG TABS tablet Take 1 tablet (20 mg total) by mouth daily with supper. Start after completing 3 weeks of  Xarelto 15 mg twice daily 30 tablet 3   tamsulosin (FLOMAX) 0.4 MG CAPS capsule Take 0.4 mg by mouth daily. (Patient not taking: Reported on 06/05/2022)     No current facility-administered medications for this visit.    HISTORY OF PRESENT ILLNESS:   Oncology History  Melanoma of skin ([place])  12/01/2019 Initial Diagnosis   Melanoma of skin ([place])   12/05/2019 Cancer Staging   Staging form: Melanoma of the Skin, AJCC 8th Edition - Clinical stage from 12/05/2019: Stage IIA (cT3a, cN0, cM0) - Signed by Derwood Kaplan, MD on 05/30/2020   02/18/2021 Genetic Testing   No pathogenic variants detected in [facility] Multi-Cancer +RNA Panel.  Variants of uncertain significance detected in APC (c.681C>G (p.Asp227Glu)), CDKN1C (c.610C>G (p.Pro204Ala)), and FH (c.1151C>T (p.Ala384Val)).  The report date is February 18, 2021.    The Multi-Cancer + RNA Panel offered by [facility] includes sequencing and/or deletion/duplication analysis of the following 84 genes:  AIP*, ALK, APC*, ATM*, AXIN2*, BAP1*, BARD1*, BLM*, BMPR1A*, BRCA1*, BRCA2*, BRIP1*, CASR, CDC73*, CDH1*, CDK4, CDKN1B*, CDKN1C*, CDKN2A, CEBPA, CHEK2*, CTNNA1*, DICER1*, DIS3L2*, EGFR, EPCAM, FH*, FLCN*, GATA2*, GPC3, GREM1, HOXB13, HRAS, KIT, MAX*, MEN1*, MET, MITF, MLH1*, MSH2*, MSH3*, MSH6*, MUTYH*, NBN*, NF1*, NF2*, NTHL1*, PALB2*, PDGFRA, PHOX2B, PMS2*, POLD1*, POLE*, POT1*, PRKAR1A*, PTCH1*, PTEN*, RAD50*, RAD51C*, RAD51D*, RB1*, RECQL4, RET, RUNX1*, SDHA*, SDHAF2*, SDHB*, SDHC*, SDHD*, SMAD4*, SMARCA4*, SMARCB1*, SMARCE1*, STK11*, SUFU*, TERC, TERT, TMEM127*, Tp53*, TSC1*, TSC2*, VHL*, WRN*, and WT1.  RNA analysis is performed for * genes.   01/30/2022 - 02/20/2022 Chemotherapy   Patient is on Treatment Plan : MELANOMA Nivolumab + Ipilimumab (1/3) q21d / Nivolumab q28d     01/30/2022 -  Chemotherapy   Patient is on Treatment Plan : MELANOMA Nivolumab (1) + Ipilimumab (3) q21d / Nivolumab (480) q28d     Malignant melanoma metastatic to brain ([place])   12/19/2021 Initial Diagnosis   Malignant melanoma metastatic to brain ([place])   01/30/2022 - 02/20/2022 Chemotherapy   Patient is on Treatment Plan : MELANOMA Nivolumab + Ipilimumab (1/3) q21d / Nivolumab q28d     01/30/2022 -  Chemotherapy   Patient is on Treatment Plan : MELANOMA Nivolumab (1) + Ipilimumab (3) q21d / Nivolumab (480) q28d     Malignant melanoma metastatic to lung ([place])  12/19/2021 Initial Diagnosis   Malignant melanoma metastatic to lung ([place])   01/30/2022 - 02/20/2022 Chemotherapy   Patient is on Treatment Plan : MELANOMA Nivolumab + Ipilimumab (1/3) q21d / Nivolumab q28d     01/30/2022 -  Chemotherapy   Patient is on Treatment Plan : MELANOMA Nivolumab (1) + Ipilimumab (3) q21d / Nivolumab (480) q28d     03/24/2022 Imaging   CTA chest:  IMPRESSION:  1. Small nonocclusive segmental level pulmonary embolism in the  inferior LEFT upper lobe.  2. Marked bronchial wall thickening with areas of patchy basilar  opacities and new ground-glass opacities in the upper lobes.  Constellation of findings may be infectious bronchiolitis/bronchitis  though the possibility of drug related changes in the setting of  immunotherapy are also considered. Furthermore, given basilar  predominance and some material in basilar bronchial structures would  correlate with any risk factors for or history of aspiration.  3. Response to therapy of bilateral metastatic lesions. Some lymph  nodes in the chest with interval increase in size are equivocal at  this time, potentially reactive, but warrant attention on subsequent  imaging.    Malignant neoplasm metastatic to liver ([place])  12/19/2021 Initial Diagnosis   Malignant neoplasm metastatic to liver ([place])   01/30/2022 - 02/20/2022 Chemotherapy   Patient is on Treatment Plan : MELANOMA Nivolumab + Ipilimumab (1/3) q21d / Nivolumab q28d     01/30/2022 -  Chemotherapy   Patient is on Treatment Plan : MELANOMA Nivolumab (1) + Ipilimumab (3) q21d /  Nivolumab (480) q28d         REVIEW OF SYSTEMS:   Review of Systems  Constitutional:  Negative for appetite change, chills, fever and unexpected weight change.  HENT:  Negative.  Negative for lump/mass, mouth sores and sore throat.   Eyes: Negative.   Respiratory: Negative.  Negative for chest tightness, cough, hemoptysis, shortness of breath and wheezing.   Cardiovascular: Negative.  Negative for chest pain, leg swelling and palpitations.  Gastrointestinal: Negative.  Negative for abdominal distention, abdominal pain, blood in stool, constipation, diarrhea, nausea and vomiting.  Endocrine: Negative.  Negative for hot flashes.  Genitourinary:  Positive for bladder incontinence, difficulty urinating, frequency and nocturia. Negative for dysuria and hematuria.   Musculoskeletal:  Negative for arthralgias, back pain, flank pain and gait problem.  Skin:  Positive for itching.  Neurological:  Positive for headaches. Negative for dizziness, extremity weakness, gait problem, light-headedness, numbness, seizures and speech difficulty.  Hematological: Negative.  Negative for adenopathy. Does not bruise/bleed easily.  Psychiatric/Behavioral: Negative.  Negative for depression and sleep disturbance. The patient is not nervous/anxious.      VITALS:  Blood pressure 123/82, pulse (!) 101, temperature 98.4 F (36.9 C), temperature source Oral, resp. rate 18, height _0  (1.753 m), weight 155 lb 3.2 oz (70.4 kg), SpO2 98 %.  Wt Readings from Last 3 Encounters:  06/12/22 163 lb (73.9 kg)  06/05/22 158 lb 14.4 oz (72.1 kg)  06/02/22 154 lb (69.9 kg)    Body mass index is 22.92 kg/m.  Performance status (ECOG): 1 - Symptomatic but completely ambulatory  PHYSICAL EXAM:   Physical Exam Vitals and nursing note reviewed.  Constitutional:      General: He is not in acute distress.    Appearance: Normal appearance. He is normal weight. He is not ill-appearing or toxic-appearing.  HENT:     Head:  Normocephalic and atraumatic.     Mouth/Throat:     Mouth: Mucous membranes are moist.     Pharynx: Oropharynx is clear. No oropharyngeal exudate or posterior oropharyngeal erythema.  Eyes:     General: No scleral icterus.    Extraocular Movements: Extraocular movements intact.     Conjunctiva/sclera: Conjunctivae normal.     Pupils: Pupils are equal, round, and reactive to light.  Cardiovascular:     Rate and Rhythm: Normal rate and regular rhythm.     Pulses: Normal  pulses.     Heart sounds: Normal heart sounds. No murmur heard.    No friction rub. No gallop.  Pulmonary:     Effort: Pulmonary effort is normal. No respiratory distress.     Breath sounds: Normal breath sounds. No wheezing, rhonchi or rales.  Abdominal:     General: Bowel sounds are normal. There is no distension.     Palpations: Abdomen is soft. There is no hepatomegaly, splenomegaly or mass.     Tenderness: There is no abdominal tenderness.  Musculoskeletal:        General: No swelling or tenderness. Normal range of motion.     Cervical back: Normal range of motion and neck supple. No tenderness.     Right lower leg: No edema.     Left lower leg: No edema.  Lymphadenopathy:     Cervical: No cervical adenopathy.     Upper Body:     Right upper body: No supraclavicular or axillary adenopathy.     Left upper body: No supraclavicular or axillary adenopathy.     Lower Body: No right inguinal adenopathy. No left inguinal adenopathy.  Skin:    General: Skin is warm and dry.     Coloration: Skin is not jaundiced.     Comments: Few excoriated areas in lower left thoracic area. Several large scars of mid to lower back.  Neurological:     Mental Status: He is alert and oriented to person, place, and time.     Cranial Nerves: No cranial nerve deficit.  Psychiatric:        Mood and Affect: Mood normal.        Behavior: Behavior normal.        Thought Content: Thought content normal.       LABORATORY DATA:  I have  reviewed the data as listed    Component Value Date/Time   NA 140 06/12/2022 1118   NA 132 (A) 05/08/2022 0000   K 4.0 06/12/2022 1118   CL 105 06/12/2022 1118   CO2 27 06/12/2022 1118   GLUCOSE 102 (H) 06/12/2022 1118   BUN 20 06/12/2022 1118   BUN 11 05/08/2022 0000   CREATININE 0.90 06/12/2022 1118   CALCIUM 9.0 06/12/2022 1118   PROT 6.4 (L) 06/12/2022 1118   PROT 6.2 (A) 05/08/2022 0000   ALBUMIN 3.6 06/12/2022 1118   AST 14 (L) 06/12/2022 1118   ALT 14 06/12/2022 1118   ALKPHOS 49 06/12/2022 1118   BILITOT 0.5 06/12/2022 1118   GFRNONAA >60 06/12/2022 1118    No results found for: "SPEP", "UPEP"  Lab Results  Component Value Date   WBC 9.6 06/12/2022   NEUTROABS 7.9 (H) 06/12/2022   HGB 12.1 (L) 06/12/2022   HCT 38.2 (L) 06/12/2022   MCV 98.2 06/12/2022   PLT 463 (H) 06/12/2022      Chemistry      Component Value Date/Time   NA 140 06/12/2022 1118   NA 132 (A) 05/08/2022 0000   K 4.0 06/12/2022 1118   CL 105 06/12/2022 1118   CO2 27 06/12/2022 1118   BUN 20 06/12/2022 1118   BUN 11 05/08/2022 0000   CREATININE 0.90 06/12/2022 1118   GLU 78 05/08/2022 0000      Component Value Date/Time   CALCIUM 9.0 06/12/2022 1118   ALKPHOS 49 06/12/2022 1118   AST 14 (L) 06/12/2022 1118   ALT 14 06/12/2022 1118   BILITOT 0.5 06/12/2022 1118       RADIOGRAPHIC  STUDIES: I have personally reviewed the radiological images as listed and agreed with the findings in the report. No results found.  CTA chest with contrast 03/24/2022    MR BRAIN W WO CONTRAST Result Date: 03/24/2022 CLINICAL DATA:  Brain metastases, assess treatment response; metastatic melanoma EXAM: MRI HEAD WITHOUT AND WITH CONTRAST TECHNIQUE: Multiplanar, multiecho pulse sequences of the brain and surrounding structures were obtained without and with intravenous contrast. CONTRAST:  7.1 mL [employer] COMPARISON:  01/04/2022 FINDINGS: Brain: Status post left pterional craniotomy and subjacent resection  cavity, which demonstrates some intrinsic T1 hyperintense signal along the margin (series 600, image 178) and some thin linear enhancement (series 1100, image 187). Additional small areas of somewhat more nodular enhancement (series 1100, image 184). Improved associated edema. Heterogeneous fluid in the resection cavity. Right inferior frontal gyrus enhancing lesion measures 15 x 11 x 10 mm (series 1100, image 170 and series 10, image 20), previously 10 x 9 x 9 mm when remeasured similarly. Increased associated edema with 5 mm right-to-left midline shift at the level of the inferior frontal lobes, previously 3 mm. Previously noted left inferior frontal gyrus lesion no longer demonstrates enhancement. Lesion in the left aspect of the pons near the 7/8 cranial nerve root entry zone measures 11 x 8 x 8 mm (series 1100, image 136 and series 10, image 23), previously 11 x 10 x 9 mm. Stable to slightly decreased associated edema. No definite new lesion. Small focus of enhancement in the left parietal lobe (series 1100, image 230) is not associated with any edema and may be vascular. No evidence of acute infarct, hemorrhage, mass effect, or midline shift. No hydrocephalus. Vascular: Normal arterial flow voids. Skull and upper cervical spine: Left pterional craniotomy. Otherwise normal marrow signal. Sinuses/Orbits: No acute finding. Other: The mastoids are well aerated. IMPRESSION: 1. Left temporal resection cavity demonstrates thin linear enhancement along the resection margins, which may be postoperative, with additional small areas of somewhat more nodular enhancement, which are nonspecific but could indicate recurrent disease. Attention on follow-up. 2. Interval increase in the size of the right inferior frontal gyrus enhancing lesion with increased surrounding edema, mild local mass effect, and mild local midline shift. 3. Stable to slightly decreased size of the left pontine lesion with decreased associated edema. 4.  Previously noted left inferior frontal gyrus lesion is no longer visualized. 5. No definite new lesion. Electronically Signed   By: Merilyn Baba M.D.   On: 03/24/2022 11:38     EXAM : 01/04/2022 MRI BRAIN W WO CONTRAST FINDINGS: Brain: New left side craniotomy. Smooth new widespread left hemisphere pachymeningeal thickening is probably postoperative in nature (series 11, image 17). Posterior left temporal/operculum resection cavity with heterogeneous internal contents, both T1 intrinsic and isointense. Some evidence of marginal diffusion restriction (series 2, image 27). No definite enhancement following contrast. Regional vasogenic edema and mass effect are mildly reduced.   T1 intrinsic left brainstem metastasis near the 7th/8th cranial nerve root entry zone is stable at 10 mm. Regional brainstem and left cerebellar peduncle edema does appear mildly increased on series 6, image 9.   Right inferior frontal gyrus T1 hypointense and enhancing metastasis is stable measuring 10 mm (series 10, image 41). Stable mild regional edema.   Nearby small contralateral left inferior frontal gyrus metastasis measuring 3-4 mm is stable on series 10, image 41, no edema.   No new brain metastasis identified.   Stable ventricle size and configuration. No other restricted diffusion. Cervicomedullary junction and pituitary  are within normal limits. Normal basilar cisterns. Occasional small nonspecific cerebral white matter T2 and FLAIR hyperintense foci are stable.   Vascular: Major intracranial vascular flow voids are stable. The major dural venous sinuses are enhancing and appear to be patent.   Skull and upper cervical spine: Craniotomy. Negative visible cervical spine and spinal cord. Visualized bone marrow signal is within normal limits.   Sinuses/Orbits: Stable and negative.   Other: Mastoids are clear. Visible internal auditory structures appear normal. Postoperative changes to the left  scalp.   IMPRESSION: 1. New left side craniotomy and gross total resection of left temporal/operculum metastasis. Mild resection margin ischemia is possible. Stable regional edema and mass effect. Mild smooth left hemisphere meningeal thickening is likely postoperative in nature.   2. The other three smaller brain metastases are stable, aside from mildly increased associated brainstem and left cerebellar peduncle edema.   3. No new brain metastasis identified.     EXAM: 12/27/2021 MRI BRAIN W WO CONTRAST FINDINGS: Brain: There are 4 unchanged intraparenchymal masses:   1. Left temporal lobe, 3.0 cm series 11, image 85 2. Left frontal lobe 3 mm, image 79 3. Paramedian right frontal lobe 8 mm, image 75 4. Left middle cerebellar peduncle, 9 mm, image 41.   Edema surrounding the left temporal lesion is unchanged with regional mass effect but no midline shift. Evidence of chronic hemorrhage within the left temporal and left cerebellar peduncular lesion. No new lesions. No acute infarct. No extra-axial collection.   Vascular: Normal flow voids.   Skull and upper cervical spine: Normal marrow signal.   Sinuses/Orbits: Negative.   Other: None   IMPRESSION: Unchanged size and appearance of 4 intraparenchymal metastases. No new lesions.    I,Alexis Herring,acting as a scribe for Derwood Kaplan, MD.,have documented all relevant documentation on the behalf of Derwood Kaplan, MD,as directed by  Derwood Kaplan, MD while in the presence of Derwood Kaplan, MD.  I have reviewed this report as typed by the medical scribe, and it is complete and accurate.  Derwood Kaplan   06/16/22 7:26 AM

## 2022-05-30 LAB — T4: T4, Total: 8.2 ug/dL (ref 4.5–12.0)

## 2022-06-02 ENCOUNTER — Inpatient Hospital Stay: Payer: Medicare Other

## 2022-06-02 VITALS — BP 148/88 | HR 91 | Temp 97.8°F | Resp 18 | Ht 69.0 in | Wt 154.0 lb

## 2022-06-02 DIAGNOSIS — C7800 Secondary malignant neoplasm of unspecified lung: Secondary | ICD-10-CM

## 2022-06-02 DIAGNOSIS — C7931 Secondary malignant neoplasm of brain: Secondary | ICD-10-CM

## 2022-06-02 DIAGNOSIS — C7802 Secondary malignant neoplasm of left lung: Secondary | ICD-10-CM | POA: Diagnosis not present

## 2022-06-02 DIAGNOSIS — C787 Secondary malignant neoplasm of liver and intrahepatic bile duct: Secondary | ICD-10-CM | POA: Diagnosis not present

## 2022-06-02 DIAGNOSIS — C4361 Malignant melanoma of right upper limb, including shoulder: Secondary | ICD-10-CM | POA: Diagnosis not present

## 2022-06-02 DIAGNOSIS — C439 Malignant melanoma of skin, unspecified: Secondary | ICD-10-CM

## 2022-06-02 DIAGNOSIS — Z5112 Encounter for antineoplastic immunotherapy: Secondary | ICD-10-CM | POA: Diagnosis not present

## 2022-06-02 DIAGNOSIS — C7801 Secondary malignant neoplasm of right lung: Secondary | ICD-10-CM | POA: Diagnosis not present

## 2022-06-02 MED ORDER — SODIUM CHLORIDE 0.9% FLUSH
10.0000 mL | INTRAVENOUS | Status: DC | PRN
  Administered 2022-06-02: 10 mL

## 2022-06-02 MED ORDER — SODIUM CHLORIDE 0.9 % IV SOLN: Freq: Once | INTRAVENOUS | Status: AC

## 2022-06-02 MED ORDER — HEPARIN SOD (PORK) LOCK FLUSH 100 UNIT/ML IV SOLN
500.0000 [IU] | Freq: Once | INTRAVENOUS | Status: AC | PRN
  Administered 2022-06-02: 500 [IU]

## 2022-06-02 MED ORDER — SODIUM CHLORIDE 0.9 % IV SOLN
240.0000 mg | Freq: Once | INTRAVENOUS | Status: AC
  Administered 2022-06-02: 240 mg via INTRAVENOUS
  Filled 2022-06-02: qty 24

## 2022-06-02 NOTE — Patient Instructions (Signed)
Nivolumab Injection What is this medication? NIVOLUMAB (nye VOL ue mab) treats some types of cancer. It works by helping your immune system slow or stop the spread of cancer cells. It is a monoclonal antibody. This medicine may be used for other purposes; ask your health care provider or pharmacist if you have questions. COMMON BRAND NAME(S): Opdivo What should I tell my care team before I take this medication? They need to know if you have any of these conditions: Allogeneic stem cell transplant (uses someone else's stem cells) Autoimmune diseases, such as Crohn disease, ulcerative colitis, lupus History of chest radiation Nervous system problems, such as Guillain-Barre syndrome or myasthenia gravis Organ transplant An unusual or allergic reaction to nivolumab, other medications, foods, dyes, or preservatives Pregnant or trying to get pregnant Breast-feeding How should I use this medication? This medication is infused into a vein. It is given in a hospital or clinic setting. A special MedGuide will be given to you before each treatment. Be sure to read this information carefully each time. Talk to your care team about the use of this medication in children. While it may be prescribed for children as young as 12 years for selected conditions, precautions do apply. Overdosage: If you think you have taken too much of this medicine contact a poison control center or emergency room at once. NOTE: This medicine is only for you. Do not share this medicine with others. What if I miss a dose? Keep appointments for follow-up doses. It is important not to miss your dose. Call your care team if you are unable to keep an appointment. What may interact with this medication? Interactions have not been studied. This list may not describe all possible interactions. Give your health care provider a list of all the medicines, herbs, non-prescription drugs, or dietary supplements you use. Also tell them if you  smoke, drink alcohol, or use illegal drugs. Some items may interact with your medicine. What should I watch for while using this medication? Your condition will be monitored carefully while you are receiving this medication. You may need blood work while taking this medication. This medication may cause serious skin reactions. They can happen weeks to months after starting the medication. Contact your care team right away if you notice fevers or flu-like symptoms with a rash. The rash may be red or purple and then turn into blisters or peeling of the skin. You may also notice a red rash with swelling of the face, lips, or lymph nodes in your neck or under your arms. Tell your care team right away if you have any change in your eyesight. Talk to your care team if you are pregnant or think you might be pregnant. A negative pregnancy test is required before starting this medication. A reliable form of contraception is recommended while taking this medication and for 5 months after the last dose. Talk to your care team about effective forms of contraception. Do not breast-feed while taking this medication and for 5 months after the last dose. What side effects may I notice from receiving this medication? Side effects that you should report to your care team as soon as possible: Allergic reactions--skin rash, itching, hives, swelling of the face, lips, tongue, or throat Dry cough, shortness of breath or trouble breathing Eye pain, redness, irritation, or discharge with blurry or decreased vision Heart muscle inflammation--unusual weakness or fatigue, shortness of breath, chest pain, fast or irregular heartbeat, dizziness, swelling of the ankles, feet, or hands Hormone   gland problems--headache, sensitivity to light, unusual weakness or fatigue, dizziness, fast or irregular heartbeat, increased sensitivity to cold or heat, excessive sweating, constipation, hair loss, increased thirst or amount of urine,  tremors or shaking, irritability Infusion reactions--chest pain, shortness of breath or trouble breathing, feeling faint or lightheaded Kidney injury (glomerulonephritis)--decrease in the amount of urine, red or dark brown urine, foamy or bubbly urine, swelling of the ankles, hands, or feet Liver injury--right upper belly pain, loss of appetite, nausea, light-colored stool, dark yellow or brown urine, yellowing skin or eyes, unusual weakness or fatigue Pain, tingling, or numbness in the hands or feet, muscle weakness, change in vision, confusion or trouble speaking, loss of balance or coordination, trouble walking, seizures Rash, fever, and swollen lymph nodes Redness, blistering, peeling, or loosening of the skin, including inside the mouth Sudden or severe stomach pain, bloody diarrhea, fever, nausea, vomiting Side effects that usually do not require medical attention (report these to your care team if they continue or are bothersome): Bone, joint, or muscle pain Diarrhea Fatigue Loss of appetite Nausea Skin rash This list may not describe all possible side effects. Call your doctor for medical advice about side effects. You may report side effects to FDA at 1-800-FDA-1088. Where should I keep my medication? This medication is given in a hospital or clinic. It will not be stored at home. NOTE: This sheet is a summary. It may not cover all possible information. If you have questions about this medicine, talk to your doctor, pharmacist, or health care provider.  2023 [employer]/Gold Standard (2021-10-21 00:00:00)  

## 2022-06-04 NOTE — Assessment & Plan Note (Deleted)
Acute onset severe diarrhea. Stool studies did not reveal an infectious cause. 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  Initially, he had persistent diarrhea on an hourly basis, but he was only taking prednisone 40 mg daily most days.  I asked him to increase this to 60 mg at breakfast and he did have improvement in the diarrhea, but continued to use Imodium, so I did not decrease his prednisone.  He then decreased the prednisone on his own to 20 mg daily with breakfast, as he was having swelling of his feet with the higher dose.  He still had 1 watery stool per day on this dose, but continued Imodium daily.  Normally, we would not decrease his prednisone at this rate, but I had him continue prednisone 20 mg daily and advised him to contact us if he has worsening diarrhea.  When he was seen by Dr. Hinton Rao last week, he had discontinued prednisone.  He then developed fairly severe generalized pruritus.  She started him back on prednisone 10 mg daily.

## 2022-06-05 ENCOUNTER — Inpatient Hospital Stay (INDEPENDENT_AMBULATORY_CARE_PROVIDER_SITE_OTHER): Payer: Medicare Other | Admitting: Hematology and Oncology

## 2022-06-05 ENCOUNTER — Encounter: Payer: Self-pay | Admitting: *Deleted

## 2022-06-05 ENCOUNTER — Encounter: Payer: Self-pay | Admitting: Hematology and Oncology

## 2022-06-05 ENCOUNTER — Inpatient Hospital Stay: Payer: Medicare Other

## 2022-06-05 VITALS — BP 134/90 | HR 91 | Temp 99.0°F | Resp 20 | Ht 69.0 in | Wt 158.9 lb

## 2022-06-05 DIAGNOSIS — C7800 Secondary malignant neoplasm of unspecified lung: Secondary | ICD-10-CM

## 2022-06-05 DIAGNOSIS — I2699 Other pulmonary embolism without acute cor pulmonale: Secondary | ICD-10-CM

## 2022-06-05 DIAGNOSIS — D72828 Other elevated white blood cell count: Secondary | ICD-10-CM | POA: Diagnosis not present

## 2022-06-05 DIAGNOSIS — C787 Secondary malignant neoplasm of liver and intrahepatic bile duct: Secondary | ICD-10-CM | POA: Diagnosis not present

## 2022-06-05 DIAGNOSIS — K523 Indeterminate colitis: Secondary | ICD-10-CM | POA: Diagnosis not present

## 2022-06-05 DIAGNOSIS — C4361 Malignant melanoma of right upper limb, including shoulder: Secondary | ICD-10-CM | POA: Diagnosis not present

## 2022-06-05 DIAGNOSIS — D630 Anemia in neoplastic disease: Secondary | ICD-10-CM | POA: Diagnosis not present

## 2022-06-05 DIAGNOSIS — C7931 Secondary malignant neoplasm of brain: Secondary | ICD-10-CM | POA: Diagnosis not present

## 2022-06-05 DIAGNOSIS — D72829 Elevated white blood cell count, unspecified: Secondary | ICD-10-CM

## 2022-06-05 DIAGNOSIS — Z5112 Encounter for antineoplastic immunotherapy: Secondary | ICD-10-CM | POA: Diagnosis not present

## 2022-06-05 DIAGNOSIS — C439 Malignant melanoma of skin, unspecified: Secondary | ICD-10-CM

## 2022-06-05 DIAGNOSIS — C7802 Secondary malignant neoplasm of left lung: Secondary | ICD-10-CM | POA: Diagnosis not present

## 2022-06-05 DIAGNOSIS — C7801 Secondary malignant neoplasm of right lung: Secondary | ICD-10-CM | POA: Diagnosis not present

## 2022-06-05 HISTORY — DX: Elevated white blood cell count, unspecified: D72.829

## 2022-06-05 LAB — CMP (CANCER CENTER ONLY)
ALT: 16 U/L (ref 0–44)
AST: 15 U/L (ref 15–41)
Albumin: 3.7 g/dL (ref 3.5–5.0)
Alkaline Phosphatase: 55 U/L (ref 38–126)
Anion gap: 7 (ref 5–15)
BUN: 13 mg/dL (ref 6–20)
CO2: 28 mmol/L (ref 22–32)
Calcium: 9.2 mg/dL (ref 8.9–10.3)
Chloride: 106 mmol/L (ref 98–111)
Creatinine: 0.7 mg/dL (ref 0.61–1.24)
GFR, Estimated: >60 mL/min (ref >60)
Glucose, Bld: 98 mg/dL (ref 70–99)
Potassium: 4.4 mmol/L (ref 3.5–5.1)
Sodium: 141 mmol/L (ref 135–145)
Total Bilirubin: 0.5 mg/dL (ref 0.3–1.2)
Total Protein: 6.8 g/dL (ref 6.5–8.1)

## 2022-06-05 LAB — CBC WITH DIFFERENTIAL (CANCER CENTER ONLY)
Abs Immature Granulocytes: 0.23 10*3/uL — ABNORMAL HIGH (ref 0.00–0.07)
Basophils Absolute: 0.1 10*3/uL (ref 0.0–0.1)
Basophils Relative: 1 %
Eosinophils Absolute: 0 10*3/uL (ref 0.0–0.5)
Eosinophils Relative: 0 %
HCT: 38.5 % — ABNORMAL LOW (ref 39.0–52.0)
Hemoglobin: 12.2 g/dL — ABNORMAL LOW (ref 13.0–17.0)
Immature Granulocytes: 2 %
Lymphocytes Relative: 5 %
Lymphs Abs: 0.6 10*3/uL — ABNORMAL LOW (ref 0.7–4.0)
MCH: 31 pg (ref 26.0–34.0)
MCHC: 31.7 g/dL (ref 30.0–36.0)
MCV: 97.7 fL (ref 80.0–100.0)
Monocytes Absolute: 0.3 10*3/uL (ref 0.1–1.0)
Monocytes Relative: 3 %
Neutro Abs: 10.6 10*3/uL — ABNORMAL HIGH (ref 1.7–7.7)
Neutrophils Relative %: 89 %
Platelet Count: 466 10*3/uL — ABNORMAL HIGH (ref 150–400)
RBC: 3.94 MIL/uL — ABNORMAL LOW (ref 4.22–5.81)
RDW: 17.2 % — ABNORMAL HIGH (ref 11.5–15.5)
WBC Count: 11.8 10*3/uL — ABNORMAL HIGH (ref 4.0–10.5)
nRBC: 0 % (ref 0.0–0.2)

## 2022-06-05 LAB — TSH: TSH: 0.805 u[IU]/mL (ref 0.350–4.500)

## 2022-06-05 NOTE — Assessment & Plan Note (Addendum)
Bilateral hypermetabolic pulmonary nodules on PET in June, the largest measuring 2.3 cm, which had increased in size compared with the CT chest performed in May.  BRAF testing was negative. He was treated with 4 cycles of ipilimumab/nivolumab.   CTA in September done due to shortness of breath, revealed pulmonary embolism and changes consistent with bronchitis.  There was a decrease in the bilateral pulmonary nodules consistent with a response to therapy. There was interval increase in hilar and mediastinal lymph nodes, but this is commonly seen with immunotherapy.  He was placed on anticoagulation and treated for bronchitis.  Prior to starting maintenance nivolumab he developed acute onset diarrhea felt to be immune mediated, so immunotherapy was placed on hold and he was started on high dose prednisone.  He was not initially compliant with prednisone 20 mg 3 times daily, so was recommended for daily dosing.  His dose was later decreased to 40 mg daily, but then he was only taking 20 mg daily due to lower extremity edema.  He had been he discontinued prednisone on his own, but developed general pruritus, so was placed back on prednisone 10 mg daily last week.  Maintenance nivolumab was started on November 27.  He tolerated this fairly well.  He reports increased itching despite continuing prednisone 10 mg daily.  He denies recurrent diarrhea.

## 2022-06-05 NOTE — Progress Notes (Signed)
Patient Care Team: Earlyne Iba, NP as PCP - General (Nurse Practitioner) Derwood Kaplan, MD as Consulting Physician (Oncology) Gatha Mayer, MD as Consulting Physician (Radiation Oncology)  Clinic Day: 06/12/22   Referring physician: Earlyne Iba, NP  ASSESSMENT & PLAN:   Assessment & Plan: Melanoma of skin ([place]) Stage IIB malignant melanoma of the right posterior shoulder, Breslow's depth 3.3 mm, Clark level IV, treated with wide local excision, diagnosed in May 2021.  There was still a question as to whether this represented a metastatic lesion, rather than a primary, based on the presence of 2 nodules and no epidermal involvement.  MRI head and CT chest, abdomen and pelvis in June were negative for metastatic disease.   No adjuvant therapy was recommended.  PET scan in September was negative for metastatic disease at that time. Chest x-ray in June 2022 was negative.   Metastasis to subcutaneous tissue  He presented with a new area of concern to the left mid back in June of this year. A superficial nodule was noted approximately 1 cm to the right of his upper thoracic spine,and it had increased in size. He was sent for biopsy and that was performed on June 13 and confirmed metastatic malignant melanoma.   He has several hypermetabolic subcutaneous nodules seen on PET scan.  Metastasis to liver He has a small hypermetabolic liver lesion which is new and less than 1 cm but suspicious for metastasis.  Metastasis to lymph nodes He has multiple hypermetabolic nodes of the retroperitoneal and right pelvic area as well as right external iliac and a few additional nodes.  Pulmonary metastases He has bilateral hypermetabolic pulmonary nodules which are increased in size compared with the CT chest performed in May of this year.  The largest nodule is up to 2.3 cm in diameter.  Metastases to brain This was suspicious on the PET scan in the left frontoparietal lobe and so he had an  MRI of the brain.  This confirmed a 2.6 cm heterogeneous lesion of the left superior temporal gyrus with mild enhancement and extensive surrounding edema.  He also has an 8 mm cortical lesion of the inferomedial right frontal lobe with mild edema, a 2 mm mildly enhancing cortical lesion of the inferolateral left frontal lobe and a intrinsically T1 hyperintense lesion of the left lateral pons measuring 7 mm with surrounding edema.  He has 4 lesions in total but 3 are less than 1 cm.  He has received stereotactic radiation. He had neurologic symptoms in September and was placed on dexamethasone. Scans did show some cerebral edema so he was slowly tapered off.  Will be due to see Dr. Mickeal Skinner with a repeat brain MRI later this month.   Family history of breast cancer Maternal family history of breast cancer in addition to his melanoma history which raises suspicion for possible BRCA mutation.  He met with the genetic counselors and [facility] RNA + hereditary cancer panel revealed variants of uncertain significance (VUS) of the APC, CDKN1C and FH genes.  Skin Rash The actual skin rash is minimal and mainly involving his scalp, but he has severe pruritus. He is currently on a lose dose Prednisone 21m daily   Plan He completed his 4 cycles of combined immunotherapy. We resumed Nivolumab two weeks ago on June 02, 2022 without incident. He had a slight increase in his pruritus. He will get treated on Monday 06/16/22. We will plan to see him back in 2 weeks with CBC and  CMP for repeat evaluation before his maintenance nivolumab, every 2 weeks at first and then every 4 weeks. We will plan repeat CT scans in January. The patient understands the plans discussed today and is in agreement with them.  I have answered his questions and reviewed this information in detail.The patient knows to contact our office if he develops concerns prior to his next appointment.   Derwood Kaplan, MD  [facility] [address] [place] [address] Dept: [phone] Dept Fax: [phone]   No orders of the defined types were placed in this encounter.     CHIEF COMPLAINT:  CC: Metastatic melanoma  Current Treatment: Corticosteroids and brain irradiation, followed by immunotherapy  INTERVAL HISTORY:  Draken is here today for repeat clinical assessment of his Metastatic melanoma. Patient states that he has headaches that have started again since yesterday and comes in waves that causes him to be nauseous. He is taking Hydrocodone to help alleviate his headaches. His skin itching is tolerable so I recommend we go ahead and treat with Nivolumab on 06/16/2022. He will stay on a low does of Prednisone 35m daily. He has a MRI on the 13th and follow up with Dr. VMickeal Skinneron 06/23/22. His labs are pending. His appetite is good. His weight has increased 8lbs in the last mont..Marland KitchenHe denies pain.   I have reviewed the past medical history, past surgical history, social history and family history with the patient and they are unchanged from previous note.   ALLERGIES:  is allergic to testosterone and sulfa antibiotics.  MEDICATIONS:  Current Outpatient Medications  Medication Sig Dispense Refill   acyclovir (ZOVIRAX) 400 MG tablet Take 400 mg by mouth 3 (three) times daily. Taking once daily per patient     amitriptyline (ELAVIL) 25 MG tablet Take 50 mg by mouth at bedtime.     dexamethasone (DECADRON) 2 MG tablet Take 1 tablet (2 mg total) by mouth daily. 30 tablet 0   ergocalciferol (VITAMIN D2) 1.25 MG (50000 UT) capsule Take 50,000 Units by mouth every Wednesday.     hydrOXYzine (ATARAX) 25 MG tablet Take 1 tablet (25 mg total) by mouth 3 (three) times daily as needed for itching. 60 tablet 3   lidocaine-prilocaine (EMLA) cream Apply 1 Application topically as needed. 30 g 0   ondansetron (ZOFRAN) 4 MG tablet Take 1 tablet (4 mg total) by mouth  every 4 (four) hours as needed for nausea. (Patient not taking: Reported on 03/31/2022) 90 tablet 3   predniSONE (DELTASONE) 10 MG tablet Take 1 tablet (10 mg total) by mouth daily with breakfast. (Patient not taking: Reported on 06/26/2022) 30 tablet 2   prochlorperazine (COMPAZINE) 10 MG tablet Take 1 tablet (10 mg total) by mouth every 6 (six) hours as needed for nausea or vomiting. (Patient not taking: Reported on 03/31/2022) 90 tablet 3   rivaroxaban (XARELTO) 20 MG TABS tablet Take 1 tablet (20 mg total) by mouth daily with supper. Start after completing 3 weeks of Xarelto 15 mg twice daily 30 tablet 3   No current facility-administered medications for this visit.    HISTORY OF PRESENT ILLNESS:   Oncology History  [place]  12/01/[place] Initial Diagnosis   [place] ([place]   12/05/2019 Cancer Staging   Staging form: Melanoma of the Skin, AJCC 8th Edition - Clinical stage from 12/05/2019: Stage IIA (cT3a, cN0, cM0) - Signed by MDerwood Kaplan MD  on 05/30/2020   02/18/2021 Genetic Testing   No pathogenic variants detected in [facility] Multi-Cancer +RNA Panel.  Variants of uncertain significance detected in APC (c.681C>G (p.Asp227Glu)), CDKN1C (c.610C>G (p.Pro204Ala)), and FH (c.1151C>T (p.Ala384Val)).  The report date is February 18, 2021.    The Multi-Cancer + RNA Panel offered by [facility] includes sequencing and/or deletion/duplication analysis of the following 84 genes:  AIP*, ALK, APC*, ATM*, AXIN2*, BAP1*, BARD1*, BLM*, BMPR1A*, BRCA1*, BRCA2*, BRIP1*, CASR, CDC73*, CDH1*, CDK4, CDKN1B*, CDKN1C*, CDKN2A, CEBPA, CHEK2*, CTNNA1*, DICER1*, DIS3L2*, EGFR, EPCAM, FH*, FLCN*, GATA2*, GPC3, GREM1, HOXB13, HRAS, KIT, MAX*, MEN1*, MET, MITF, MLH1*, MSH2*, MSH3*, MSH6*, MUTYH*, NBN*, NF1*, NF2*, NTHL1*, PALB2*, PDGFRA, PHOX2B, PMS2*, POLD1*, POLE*, POT1*, PRKAR1A*, PTCH1*, PTEN*, RAD50*, RAD51C*, RAD51D*, RB1*, RECQL4, RET, RUNX1*, SDHA*, SDHAF2*, SDHB*, SDHC*, SDHD*, SMAD4*,  SMARCA4*, SMARCB1*, SMARCE1*, STK11*, SUFU*, TERC, TERT, TMEM127*, Tp53*, TSC1*, TSC2*, VHL*, WRN*, and WT1.  RNA analysis is performed for * genes.   01/30/2022 - 02/20/2022 Chemotherapy   Patient is on Treatment Plan : MELANOMA Nivolumab + Ipilimumab (1/3) q21d / Nivolumab q28d     01/30/2022 -  Chemotherapy   Patient is on Treatment Plan : MELANOMA Nivolumab (1) + Ipilimumab (3) q21d / Nivolumab (480) q28d     Malignant melanoma metastatic to brain ([place])  12/19/2021 Initial Diagnosis   Malignant melanoma metastatic to brain ([place])   01/30/2022 - 02/20/2022 Chemotherapy   Patient is on Treatment Plan : MELANOMA Nivolumab + Ipilimumab (1/3) q21d / Nivolumab q28d     01/30/2022 -  Chemotherapy   Patient is on Treatment Plan : MELANOMA Nivolumab (1) + Ipilimumab (3) q21d / Nivolumab (480) q28d     Malignant melanoma metastatic to lung ([place])  12/19/2021 Initial Diagnosis   Malignant melanoma metastatic to lung ([place])   01/30/2022 - 02/20/2022 Chemotherapy   Patient is on Treatment Plan : MELANOMA Nivolumab + Ipilimumab (1/3) q21d / Nivolumab q28d     01/30/2022 -  Chemotherapy   Patient is on Treatment Plan : MELANOMA Nivolumab (1) + Ipilimumab (3) q21d / Nivolumab (480) q28d     03/24/2022 Imaging   CTA chest:  IMPRESSION:  1. Small nonocclusive segmental level pulmonary embolism in the  inferior LEFT upper lobe.  2. Marked bronchial wall thickening with areas of patchy basilar  opacities and new ground-glass opacities in the upper lobes.  Constellation of findings may be infectious bronchiolitis/bronchitis  though the possibility of drug related changes in the setting of  immunotherapy are also considered. Furthermore, given basilar  predominance and some material in basilar bronchial structures would  correlate with any risk factors for or history of aspiration.  3. Response to therapy of bilateral metastatic lesions. Some lymph  nodes in the chest with interval increase in size are  equivocal at  this time, potentially reactive, but warrant attention on subsequent  imaging.    Malignant neoplasm metastatic to liver ([place])  12/19/2021 Initial Diagnosis   Malignant neoplasm metastatic to liver ([place])   01/30/2022 - 02/20/2022 Chemotherapy   Patient is on Treatment Plan : MELANOMA Nivolumab + Ipilimumab (1/3) q21d / Nivolumab q28d     01/30/2022 -  Chemotherapy   Patient is on Treatment Plan : MELANOMA Nivolumab (1) + Ipilimumab (3) q21d / Nivolumab (480) q28d         REVIEW OF SYSTEMS:   Review of Systems  Constitutional:  Negative for appetite change, chills, fever and unexpected weight change.  HENT:  Negative.  Negative for lump/mass, mouth sores and sore throat.  Eyes: Negative.        States his vision are blurry  Respiratory: Negative.  Negative for chest tightness, cough, hemoptysis, shortness of breath and wheezing.   Cardiovascular: Negative.  Negative for chest pain, leg swelling and palpitations.  Gastrointestinal:  Positive for nausea. Negative for abdominal distention, abdominal pain, blood in stool, constipation, diarrhea and vomiting.  Endocrine: Negative.  Negative for hot flashes.  Genitourinary: Negative.  Negative for difficulty urinating, dysuria, frequency and hematuria.   Musculoskeletal:  Negative for arthralgias, back pain, flank pain and gait problem.  Skin:  Positive for itching (tolderable with treatment).  Neurological:  Positive for headaches (comes in waves). Negative for dizziness, extremity weakness, gait problem, light-headedness, numbness, seizures and speech difficulty.  Hematological: Negative.  Negative for adenopathy. Does not bruise/bleed easily.  Psychiatric/Behavioral: Negative.  Negative for depression and sleep disturbance. The patient is not nervous/anxious.    Marland Kitchen   VITALS:  Blood pressure (!) 154/93, pulse 92, temperature 98.6 F (37 C), temperature source Oral, resp. rate 18, height _0  (1.753 m), weight 163 lb (73.9  kg), SpO2 98 %.  Wt Readings from Last 3 Encounters:  07/01/22 166 lb (75.3 kg)  06/26/22 163 lb (73.9 kg)  06/23/22 166 lb 12.8 oz (75.7 kg)    Body mass index is 24.07 kg/m.  Performance status (ECOG): 1 - Symptomatic but completely ambulatory  PHYSICAL EXAM:    Physical Exam Musculoskeletal:     Comments: Large deep scar in the right posterior shoulder that is well healed.      GENERAL:alert, no distress and comfortable SKIN: skin color, texture, turgor are normal, no rashes or significant lesions.  He has an additional nodule of the left flank which is smaller but firm. EYES: normal, Conjunctiva are pink and non-injected, sclera clear OROPHARYNX:no exudate, no erythema and lips, buccal mucosa, and tongue normal  NECK: supple, thyroid normal size, non-tender, without nodularity LYMPH:  no palpable lymphadenopathy in the cervical, axillary or inguinal LUNGS: clear to auscultation and percussion with normal breathing effort Occasional wheeze all lung feels except upper lobe HEART: regular rate & rhythm with tachycardia, and no lower extremity edema ABDOMEN:abdomen soft, non-tender and normal bowel sounds Musculoskeletal:no cyanosis of digits and no clubbing  NEURO: alert & oriented x 3 with fluent speech, no focal motor/sensory deficits Port colon right upper chest maculopapular rash surrounding superior portion. Looks like allergic rash  Large scars bilateral lower back, posteriorly. Mass in upper thoracic posterior area measuring 4 x 4 cm ,  firm   LABORATORY DATA:  I have reviewed the data as listed    Component Value Date/Time   NA 142 06/26/2022 1113   NA 132 (A) 05/08/2022 0000   K 4.0 06/26/2022 1113   CL 104 06/26/2022 1113   CO2 28 06/26/2022 1113   GLUCOSE 80 06/26/2022 1113   BUN 14 06/26/2022 1113   BUN 11 05/08/2022 0000   CREATININE 0.80 06/26/2022 1113   CALCIUM 9.6 06/26/2022 1113   PROT 7.3 06/26/2022 1113   PROT 6.2 (A) 05/08/2022 0000   ALBUMIN  4.0 06/26/2022 1113   AST 16 06/26/2022 1113   ALT 14 06/26/2022 1113   ALKPHOS 57 06/26/2022 1113   BILITOT 0.5 06/26/2022 1113   GFRNONAA >60 06/26/2022 1113    No results found for: "SPEP", "UPEP"  Lab Results  Component Value Date   WBC 7.7 06/26/2022   NEUTROABS 5.2 06/26/2022   HGB 13.4 06/26/2022   HCT 41.9 06/26/2022  MCV 97.7 06/26/2022   PLT 394 06/26/2022      Chemistry      Component Value Date/Time   NA 142 06/26/2022 1113   NA 132 (A) 05/08/2022 0000   K 4.0 06/26/2022 1113   CL 104 06/26/2022 1113   CO2 28 06/26/2022 1113   BUN 14 06/26/2022 1113   BUN 11 05/08/2022 0000   CREATININE 0.80 06/26/2022 1113   GLU 78 05/08/2022 0000      Component Value Date/Time   CALCIUM 9.6 06/26/2022 1113   ALKPHOS 57 06/26/2022 1113   AST 16 06/26/2022 1113   ALT 14 06/26/2022 1113   BILITOT 0.5 06/26/2022 1113       RADIOGRAPHIC STUDIES: I have personally reviewed the radiological images as listed and agreed with the findings in the report. MR BRAIN W WO CONTRAST  Result Date: 06/19/2022 CLINICAL DATA:  Brain metastases EXAM: MRI HEAD WITHOUT AND WITH CONTRAST TECHNIQUE: Multiplanar, multiecho pulse sequences of the brain and surrounding structures were obtained without and with intravenous contrast. CONTRAST:  7.40m GADAVIST GADOBUTROL 1 MMOL/ML IV SOLN COMPARISON:  Brain MRI 04/15/2022 FINDINGS: Brain: Postsurgical changes reflecting left pterional craniotomy are again seen. There are persistent underlying intrinsically T1 hyperintense blood products; however, there is also thin enhancement along the lateral aspect of the resection margin extending along the dura (100-185). There is no definite enhancement elsewhere about the resection cavity. Surrounding FLAIR signal abnormality is not significantly changed. Postoperative extra-axial fluid overlying the resection cavity is decreased. The 1.5 cm x 1.0 cm peripherally enhancing lesion in the right anteromedial  frontal lobe is not significantly changed in size. Edema surrounding this lesion has slightly decreased. Intralesional hemorrhage is again seen. The intrinsically T1 hyperintense lesion in the left middle cerebellar peduncle measuring 1.4 cm x 1.1 cm is slightly increased in size, previously measured 1.0 cm x 1.0 cm. Surrounding edema is not significantly changed. There is a possible punctate lesion in the left cerebellar hemisphere with surrounding FLAIR signal abnormality (1100-128). This focus of enhancement was present on the study from 03/22/2022, and the FLAIR signal abnormality is unchanged going back to December 18, 2021. There are no definite new lesions. There is no acute intracranial hemorrhage, extra-axial fluid collection, or acute infarct. Parenchymal volume is stable. The ventricles are normal in size. There is no midline shift. Vascular: Normal flow voids. Skull and upper cervical spine: Normal marrow signal. Sinuses/Orbits: The paranasal sinuses are clear. Bilateral lens implants are in place. The globes and orbits are otherwise unremarkable. Other: None. IMPRESSION: 1. Persistent intrinsically T1 hyperintense blood products at the left temporal resection site. Linear enhancement along the lateral resection margin extending to the dura may reflect evolving postoperative change, though residual/recurrent disease is not entirely excluded. Recommend continued attention on follow-up. 2. Slight interval increase in size of the lesion in the left middle cerebellar peduncle with similar surrounding edema. 3. No significant change in size of the lesion in the right anteromedial frontal lobe but with slightly decreased surrounding edema. 4. No new lesions. Electronically Signed   By: PValetta MoleM.D.   On: 06/19/2022 10:10   EXAM : 01/04/2022 MRI BRAIN W WO CONTRAST  FINDINGS: Brain: New left side craniotomy. Smooth new widespread left hemisphere pachymeningeal thickening is probably postoperative  in nature (series 11, image 17). Posterior left temporal/operculum resection cavity with heterogeneous internal contents, both T1 intrinsic and isointense. Some evidence of marginal diffusion restriction (series 2, image 27). No definite enhancement following contrast. Regional vasogenic  edema and mass effect are mildly reduced.   T1 intrinsic left brainstem metastasis near the 7th/8th cranial nerve root entry zone is stable at 10 mm. Regional brainstem and left cerebellar peduncle edema does appear mildly increased on series 6, image 9.   Right inferior frontal gyrus T1 hypointense and enhancing metastasis is stable measuring 10 mm (series 10, image 41). Stable mild regional edema.   Nearby small contralateral left inferior frontal gyrus metastasis measuring 3-4 mm is stable on series 10, image 41, no edema.   No new brain metastasis identified.   Stable ventricle size and configuration. No other restricted diffusion. Cervicomedullary junction and pituitary are within normal limits. Normal basilar cisterns. Occasional small nonspecific cerebral white matter T2 and FLAIR hyperintense foci are stable.   Vascular: Major intracranial vascular flow voids are stable. The major dural venous sinuses are enhancing and appear to be patent.   Skull and upper cervical spine: Craniotomy. Negative visible cervical spine and spinal cord. Visualized bone marrow signal is within normal limits.   Sinuses/Orbits: Stable and negative.   Other: Mastoids are clear. Visible internal auditory structures appear normal. Postoperative changes to the left scalp.   IMPRESSION: 1. New left side craniotomy and gross total resection of left temporal/operculum metastasis. Mild resection margin ischemia is possible. Stable regional edema and mass effect. Mild smooth left hemisphere meningeal thickening is likely postoperative in nature.   2. The other three smaller brain metastases are stable, aside  from mildly increased associated brainstem and left cerebellar peduncle edema.   3. No new brain metastasis identified.     EXAM: 12/27/2021 MRI BRAIN W WO CONTRAST  FINDINGS: Brain: There are 4 unchanged intraparenchymal masses:   1. Left temporal lobe, 3.0 cm series 11, image 85 2. Left frontal lobe 3 mm, image 79 3. Paramedian right frontal lobe 8 mm, image 75 4. Left middle cerebellar peduncle, 9 mm, image 41.   Edema surrounding the left temporal lesion is unchanged with regional mass effect but no midline shift. Evidence of chronic hemorrhage within the left temporal and left cerebellar peduncular lesion. No new lesions. No acute infarct. No extra-axial collection.   Vascular: Normal flow voids.   Skull and upper cervical spine: Normal marrow signal.   Sinuses/Orbits: Negative.   Other: None   IMPRESSION: Unchanged size and appearance of 4 intraparenchymal metastases. No new lesions.     I,Gabriella Ballesteros,acting as a scribe for Derwood Kaplan, MD.,have documented all relevant documentation on the behalf of Derwood Kaplan, MD,as directed by  Derwood Kaplan, MD while in the presence of Derwood Kaplan, MD.

## 2022-06-05 NOTE — Assessment & Plan Note (Signed)
Non occlusive segmental pulmonary embolism in the inferior left upper lobe diagnosed in September.  He remains on rivaroxaban 20 mg daily.  He denies abnormal bruising or bleeding.

## 2022-06-05 NOTE — Assessment & Plan Note (Signed)
Mild leukocytosis due to prednisone.

## 2022-06-05 NOTE — Assessment & Plan Note (Addendum)
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  There was still a question as to whether this represented a metastatic lesion, rather than a primary, based on the presence of 2 nodules and no epidermal involvement.  MRI head and CT chest, abdomen and pelvis in June 2021 were negative for metastatic disease.   No adjuvant therapy was recommended.  PET scan in September was negative for metastatic disease. Chest x-ray in June 2022 was negative.  He was found to have recurrence in mid back in June of this year.  Biopsy revealed metastatic malignant melanoma.  Melan-A was positive S100 was positive SOX10 was positive and PRAME positive.  Staging PET in June revealed a focal hypermetabolic lesion of the left frontoparietal lobe, concerning for brain metastasis.  There was a small hypermetabolic subcentimeter liver lesion concerning for liver metastatic disease.   There were bilateral hypermetabolic pulmonary nodules.  There were hypermetabolic retroperitoneal and right pelvic lymph nodes, consistent with metastatic disease.  There were multiple hypermetabolic subcutaneous soft tissue nodules, also compatible with metastatic disease.  MRI brain confirmed the left frontoparietal lesion and revealed several other smaller lesions.  The solitary brain metastasis was treated with surgical resection, followed by stereotactic radiosurgery.    He was then placed on palliative ipilimumab/nivolumab and has had resolution of the palpable masses in the back and left flank.  He was to start maintenance nivolumab, but developed severe diarrhea.  Stool studies were negative, so this was felt to be immune mediated, so immunotherapy was placed on hold.  He was treated with high-dose prednisone, but he has not been extremely compliant.  He had resolution of the diarrhea, so discontinued prednisone on his own.  He then developed  generalized pruritus, also possibly due to immunotherapy, so we have resumed prednisone 10 mg daily.  Maintenance nivolumab was resumed on November 27.  He states the pruritus seems to have increased since treatment, despite denies recurrent diarrhea.

## 2022-06-05 NOTE — Progress Notes (Signed)
[place]  [address] [place],    [address] [phone]  Clinic Day:  06/05/2022  Referring physician: Earlyne Iba, NP  ASSESSMENT & PLAN:   Assessment & Plan: Malignant melanoma metastatic to lung [facility]) Bilateral hypermetabolic pulmonary nodules on PET in June, the largest measuring 2.3 cm, which had increased in size compared with the CT chest performed in May.  BRAF testing was negative. He was treated with 4 cycles of ipilimumab/nivolumab.   CTA in September done due to shortness of breath, revealed pulmonary embolism and changes consistent with bronchitis.  There was a decrease in the bilateral pulmonary nodules consistent with a response to therapy. There was interval increase in hilar and mediastinal lymph nodes, but this is commonly seen with immunotherapy.  He was placed on anticoagulation and treated for bronchitis.  Prior to starting maintenance nivolumab he developed acute onset diarrhea felt to be immune mediated, so immunotherapy was placed on hold and he was started on high dose prednisone.  He was not initially compliant with prednisone 20 mg 3 times daily, so was recommended for daily dosing.  His dose was later decreased to 40 mg daily, but then he was only taking 20 mg daily due to lower extremity edema.  He had been he discontinued prednisone on his own, but developed general pruritus, so was placed back on prednisone 10 mg daily last week.  Maintenance nivolumab was started on November 27.  He tolerated this fairly well.  He reports increased itching despite continuing prednisone 10 mg daily.  He denies recurrent diarrhea.  Melanoma of skin ([place]) Stage IIB malignant melanoma of the right posterior shoulder, Breslow's depth 3.3 mm, Clark level IV, treated with wide local excision, diagnosed in May 2021.  There was still a question as to whether this represented a metastatic lesion, rather than a primary, based on the presence of 2  nodules and no epidermal involvement.  MRI head and CT chest, abdomen and pelvis in June 2021 were negative for metastatic disease.   No adjuvant therapy was recommended.  PET scan in September was negative for metastatic disease. Chest x-ray in June 2022 was negative.  He was found to have recurrence in mid back in June of this year.  Biopsy revealed metastatic malignant melanoma.  Melan-A was positive S100 was positive SOX10 was positive and PRAME positive.  Staging PET in June revealed a focal hypermetabolic lesion of the left frontoparietal lobe, concerning for brain metastasis.  There was a small hypermetabolic subcentimeter liver lesion concerning for liver metastatic disease.   There were bilateral hypermetabolic pulmonary nodules.  There were hypermetabolic retroperitoneal and right pelvic lymph nodes, consistent with metastatic disease.  There were multiple hypermetabolic subcutaneous soft tissue nodules, also compatible with metastatic disease.  MRI brain confirmed the left frontoparietal lesion and revealed several other smaller lesions.  The solitary brain metastasis was treated with surgical resection, followed by stereotactic radiosurgery.    He was then placed on palliative ipilimumab/nivolumab and has had resolution of the palpable masses in the back and left flank.  He was to start maintenance nivolumab, but developed severe diarrhea.  Stool studies were negative, so this was felt to be immune mediated, so immunotherapy was placed on hold.  He was treated with high-dose prednisone, but he has not been extremely compliant.  He had resolution of the diarrhea, so discontinued prednisone on his own.  He then developed generalized pruritus, also possibly due to immunotherapy, so we have  resumed prednisone 10 mg daily.  Maintenance nivolumab was resumed on November 27.  He states the pruritus seems to have increased since treatment, despite denies recurrent diarrhea.  Malignant melanoma metastatic to  brain [facility]) Stage IIB malignant melanoma of the right posterior shoulder with recurrence in the mid back in June of this year.  Biopsy revealed metastatic malignant melanoma.  Melan-A was positive S100 was positive SOX10 was positive and PRAME positive.  Staging PET in June revealed a focal hypermetabolic lesion of the left frontoparietal lobe, concerning for brain metastasis.  MRI brain confirmed the left frontoparietal lesion and revealed several other smaller lesions.  The solitary brain metastasis was treated with surgical resection, followed by stereotactic radiosurgery.  He was then placed on palliative ipilimumab/nivolumab.  He developed left facial weakness after his third cycle.  MRI of the brain was obtained.  This revealed left temporal resection cavity demonstrates thin linear enhancement along the resection margins, which may be postoperative, with additional small areas of somewhat more nodular enhancement, which are nonspecific but could indicate recurrent disease. Interval increase in the size of the right inferior frontal gyrus enhancing lesion with increased surrounding edema, mild local mass effect, and mild local midline shift.  Stable to slightly decreased size of the left pontine lesion with decreased associated edema.  Previously noted left inferior frontal gyrus lesion is no longer visualized. No definite new lesion.  He was placed on dexamethasone 4 mg twice daily, then decreased to 4 mg daily once his neurologic symptoms resolved. After discontinuation of the dexamethasone, he resumed immunotherapy receiving a 4th and final cycle of ipilimumab/nivolumab on October 9th.  He was due to start maintenance nivolumab, but has developed severe diarrhea felt to be immune mediated, so immunotherapy was held and he was treated with high-dose prednisone.  He has not been extremely compliant with this.  He is currently on prednisone 10 mg daily.  Nivolumab maintenance was started on November 27.   The patient now has recurrent mild left-sided facial weakness despite prednisone 10 mg daily, which he is being given for generalized pruritus associated with immunotherapy. He is to follow up with Dr. Mickeal Skinner on December 18th.  He states an MRI has not been scheduled yet.  I will contact Dr. Mickeal Skinner regarding his recurrent symptoms. I told the patient it was ok to use the hydrocodone/APAP as needed.   Acute pulmonary embolism without acute cor pulmonale (HCC) Non occlusive segmental pulmonary embolism in the inferior left upper lobe diagnosed in September.  He remains on rivaroxaban 20 mg daily.  He denies abnormal bruising or bleeding.  Leukocytosis Mild leukocytosis due to prednisone.  Anemia Stable anemia of uncertain etiology, possibly due to malignancy. Evaluation did not reveal any nutritional deficiency, thyroid disease, or hemolysis contributing to the anemia.  His hemoglobin is stable.   We will plan to see him back in 1 week for repeat clinical assessment prior to nivolumab. The patient understands the plans discussed today and is in agreement with them.  He knows to contact our office if he develops concerns prior to his next appointment.   [name], PA-C  [facility] AT [facility] [address] [place] [address] Dept: [phone] Dept Fax: [phone]   No orders of the defined types were placed in this encounter.     CHIEF COMPLAINT:  CC: Metastatic melanoma  Current Treatment: Immunotherapy on hold  HISTORY OF PRESENT ILLNESS:  Oncology History  Melanoma of skin ([place])  12/01/2019 Initial Diagnosis   Melanoma of skin ([place])   12/05/2019 Cancer Staging   Staging form: Melanoma of the Skin, AJCC 8th Edition - Clinical stage from 12/05/2019: Stage IIA (cT3a, cN0, cM0) - Signed by Derwood Kaplan, MD on 05/30/2020   02/18/2021 Genetic Testing   No pathogenic variants detected in [facility]  Multi-Cancer +RNA Panel.  Variants of uncertain significance detected in APC (c.681C>G (p.Asp227Glu)), CDKN1C (c.610C>G (p.Pro204Ala)), and FH (c.1151C>T (p.Ala384Val)).  The report date is February 18, 2021.    The Multi-Cancer + RNA Panel offered by [facility] includes sequencing and/or deletion/duplication analysis of the following 84 genes:  AIP*, ALK, APC*, ATM*, AXIN2*, BAP1*, BARD1*, BLM*, BMPR1A*, BRCA1*, BRCA2*, BRIP1*, CASR, CDC73*, CDH1*, CDK4, CDKN1B*, CDKN1C*, CDKN2A, CEBPA, CHEK2*, CTNNA1*, DICER1*, DIS3L2*, EGFR, EPCAM, FH*, FLCN*, GATA2*, GPC3, GREM1, HOXB13, HRAS, KIT, MAX*, MEN1*, MET, MITF, MLH1*, MSH2*, MSH3*, MSH6*, MUTYH*, NBN*, NF1*, NF2*, NTHL1*, PALB2*, PDGFRA, PHOX2B, PMS2*, POLD1*, POLE*, POT1*, PRKAR1A*, PTCH1*, PTEN*, RAD50*, RAD51C*, RAD51D*, RB1*, RECQL4, RET, RUNX1*, SDHA*, SDHAF2*, SDHB*, SDHC*, SDHD*, SMAD4*, SMARCA4*, SMARCB1*, SMARCE1*, STK11*, SUFU*, TERC, TERT, TMEM127*, Tp53*, TSC1*, TSC2*, VHL*, WRN*, and WT1.  RNA analysis is performed for * genes.   01/30/2022 - 02/20/2022 Chemotherapy   Patient is on Treatment Plan : MELANOMA Nivolumab + Ipilimumab (1/3) q21d / Nivolumab q28d     01/30/2022 -  Chemotherapy   Patient is on Treatment Plan : MELANOMA Nivolumab (1) + Ipilimumab (3) q21d / Nivolumab (480) q28d     Malignant melanoma metastatic to brain ([place])  12/19/2021 Initial Diagnosis   Malignant melanoma metastatic to brain ([place])   01/30/2022 - 02/20/2022 Chemotherapy   Patient is on Treatment Plan : MELANOMA Nivolumab + Ipilimumab (1/3) q21d / Nivolumab q28d     01/30/2022 -  Chemotherapy   Patient is on Treatment Plan : MELANOMA Nivolumab (1) + Ipilimumab (3) q21d / Nivolumab (480) q28d     Malignant melanoma metastatic to lung ([place])  12/19/2021 Initial Diagnosis   Malignant melanoma metastatic to lung ([place])   01/30/2022 - 02/20/2022 Chemotherapy   Patient is on Treatment Plan : MELANOMA Nivolumab + Ipilimumab (1/3) q21d / Nivolumab q28d     01/30/2022 -   Chemotherapy   Patient is on Treatment Plan : MELANOMA Nivolumab (1) + Ipilimumab (3) q21d / Nivolumab (480) q28d     03/24/2022 Imaging   CTA chest:  IMPRESSION:  1. Small nonocclusive segmental level pulmonary embolism in the  inferior LEFT upper lobe.  2. Marked bronchial wall thickening with areas of patchy basilar  opacities and new ground-glass opacities in the upper lobes.  Constellation of findings may be infectious bronchiolitis/bronchitis  though the possibility of drug related changes in the setting of  immunotherapy are also considered. Furthermore, given basilar  predominance and some material in basilar bronchial structures would  correlate with any risk factors for or history of aspiration.  3. Response to therapy of bilateral metastatic lesions. Some lymph  nodes in the chest with interval increase in size are equivocal at  this time, potentially reactive, but warrant attention on subsequent  imaging.    Malignant neoplasm metastatic to liver ([place])  12/19/2021 Initial Diagnosis   Malignant neoplasm metastatic to liver [facility])   01/30/2022 - 02/20/2022 Chemotherapy   Patient is on Treatment Plan : MELANOMA Nivolumab + Ipilimumab (1/3) q21d / Nivolumab q28d     01/30/2022 -  Chemotherapy   Patient is on Treatment Plan : MELANOMA Nivolumab (1) +  Ipilimumab (3) q21d / Nivolumab (480) q28d         INTERVAL HISTORY:  Xavier Jones is here today for repeat clinical assessment.  He states he tolerated nivolumab on November 27 without significant difficulty.  He has noted increased generalized pruritus since treatment, but denies rash.  He has hydroxyzine, which is effective.  He reports having fairly severe headaches on and off for about a week.  Aleve was not effective, so he tried hydrocodone/APAP that he had leftover from his surgery.  This helped significantly, but the headache returned.  He has noted dryness of his left eye, so feels his eye may not be closing normally again.  He is  using artificial tears.  He reports occasional blurry vision, but denies double vision.  He denies nausea or vomiting. He denies fevers or chills. He denies pain. His appetite is good. His weight has increased 3 pounds over last week .  REVIEW OF SYSTEMS:  Review of Systems  Constitutional:  Negative for appetite change, chills, fatigue, fever and unexpected weight change.  HENT:   Negative for lump/mass, mouth sores and sore throat.   Eyes:  Positive for eye problems.  Respiratory:  Negative for cough and shortness of breath.   Cardiovascular:  Negative for chest pain and leg swelling.  Gastrointestinal:  Negative for abdominal pain, constipation, diarrhea, nausea and vomiting.  Genitourinary:  Negative for difficulty urinating, dysuria, frequency and hematuria.   Musculoskeletal:  Negative for arthralgias, back pain and myalgias.  Skin:  Negative for itching, rash and wound.  Neurological:  Positive for headaches. Negative for dizziness, extremity weakness, light-headedness and numbness.  Hematological:  Negative for adenopathy.  Psychiatric/Behavioral:  Negative for depression and sleep disturbance. The patient is not nervous/anxious.      VITALS:  Blood pressure (!) 134/90, pulse 91, temperature 99 F (37.2 C), temperature source Oral, resp. rate 20, height _0  (1.753 m), weight 158 lb 14.4 oz (72.1 kg), SpO2 100 %.  Wt Readings from Last 3 Encounters:  06/05/22 158 lb 14.4 oz (72.1 kg)  06/02/22 154 lb (69.9 kg)  05/28/22 155 lb 3.2 oz (70.4 kg)    Body mass index is 23.47 kg/m.  Performance status (ECOG): 1 - Symptomatic but completely ambulatory  PHYSICAL EXAM:  Physical Exam Vitals and nursing note reviewed.  Constitutional:      General: He is not in acute distress.    Appearance: Normal appearance. He is normal weight.  HENT:     Head: Normocephalic and atraumatic.     Mouth/Throat:     Mouth: Mucous membranes are moist.     Pharynx: Oropharynx is clear. No  oropharyngeal exudate or posterior oropharyngeal erythema.  Eyes:     General: No scleral icterus.    Extraocular Movements: Extraocular movements intact.     Conjunctiva/sclera: Conjunctivae normal.     Pupils: Pupils are equal, round, and reactive to light.  Cardiovascular:     Rate and Rhythm: Normal rate and regular rhythm.     Heart sounds: Normal heart sounds. No murmur heard.    No friction rub. No gallop.  Pulmonary:     Effort: Pulmonary effort is normal.     Breath sounds: Normal breath sounds. No wheezing, rhonchi or rales.  Abdominal:     General: Bowel sounds are normal. There is no distension.     Palpations: Abdomen is soft. There is no hepatomegaly, splenomegaly or mass.     Tenderness: There is no abdominal tenderness.  Musculoskeletal:  General: Normal range of motion.     Cervical back: Normal range of motion and neck supple. No tenderness.     Right lower leg: No edema.     Left lower leg: No edema.  Lymphadenopathy:     Cervical: No cervical adenopathy.     Upper Body:     Right upper body: No supraclavicular or axillary adenopathy.     Left upper body: No supraclavicular or axillary adenopathy.     Lower Body: No right inguinal adenopathy. No left inguinal adenopathy.  Skin:    General: Skin is warm and dry.     Coloration: Skin is not jaundiced.     Findings: No rash.  Neurological:     Mental Status: He is alert and oriented to person, place, and time.     Cranial Nerves: Facial asymmetry (Mild left facial weakness) present. No dysarthria.     Sensory: Sensation is intact.     Motor: Motor function is intact.     Coordination: Coordination is intact.     Gait: Gait is intact.     Comments: Pupils are equal and reactive to light.  Psychiatric:        Mood and Affect: Mood normal.        Behavior: Behavior normal.        Thought Content: Thought content normal.     LABS:      Latest Ref Rng & Units 06/05/2022    8:48 AM 05/28/2022     8:34 AM 05/20/2022    1:41 PM  CBC  WBC 4.0 - 10.5 K/uL 11.8  7.8  7.4   Hemoglobin 13.0 - 17.0 g/dL 12.2  12.4  11.9   Hematocrit 39.0 - 52.0 % 38.5  39.1  36.9   Platelets 150 - 400 K/uL 466  471  386       Latest Ref Rng & Units 06/05/2022    8:48 AM 05/28/2022    8:34 AM 05/20/2022    1:41 PM  CMP  Glucose 70 - 99 mg/dL 98  83  169   BUN 6 - 20 mg/dL _0 Creatinine 0.61 - 1.24 mg/dL 0.70  0.74  0.82   Sodium 135 - 145 mmol/L 141  140  141   Potassium 3.5 - 5.1 mmol/L 4.4  4.0  4.0   Chloride 98 - 111 mmol/L 106  105  106   CO2 22 - 32 mmol/L _1 Calcium 8.9 - 10.3 mg/dL 9.2  9.2  8.9   Total Protein 6.5 - 8.1 g/dL 6.8  6.7  6.1   Total Bilirubin 0.3 - 1.2 mg/dL 0.5  0.3  0.1   Alkaline Phos 38 - 126 U/L 55  59  60   AST 15 - 41 U/L _2 ALT 0 - 44 U/L _3 No results found for: "CEA1", "CEA" / No results found for: "CEA1", "CEA" No results found for: "PSA1" No results found for: "JJK093" No results found for: "CAN125"  No results found for: "TOTALPROTELP", "ALBUMINELP", "A1GS", "A2GS", "BETS", "BETA2SER", "GAMS", "MSPIKE", "SPEI" Lab Results  Component Value Date   TIBC 297 05/21/2022   FERRITIN 41 05/21/2022   IRONPCTSAT 18 05/21/2022   Lab Results  Component Value Date   LDH 139 05/21/2022   LDH 145 12/19/2021    STUDIES:  No results found.  HISTORY:   Past Medical History:  Diagnosis Date   Acute pulmonary embolism without acute cor pulmonale ([place]) 05/01/2022   Anemia 05/20/2022   Depression    mild   Family history of breast cancer 01/22/2021   Family history of prostate cancer 01/22/2021   Genital herpes    History of back injury 2003   Pruritic rash 02/18/2022   Skin cancer    melanoma    Past Surgical History:  Procedure Laterality Date   APPLICATION OF CRANIAL NAVIGATION N/A 01/03/2022   Procedure: APPLICATION OF CRANIAL NAVIGATION;  Surgeon: Consuella Lose, MD;  Location: [place];  Service:  Neurosurgery;  Laterality: N/A;   CRANIOTOMY Left 01/03/2022   Procedure: Stereotactic Left temporal craniotomy for resection of tumor;  Surgeon: Consuella Lose, MD;  Location: [place];  Service: Neurosurgery;  Laterality: Left;   MELANOMA EXCISION WITH SENTINEL LYMPH NODE BIOPSY Right 2021   SPINAL FUSION W/ LUQUE UNIT ROD  2003    Family History  Problem Relation Age of Onset   Prostate cancer Father        dx 70s   Breast cancer Maternal Aunt        dx after 62; bilateral mastectomy   Stomach cancer Maternal Grandfather        dx 65s   Breast cancer Cousin 29       maternal male cousin; mets    Social History:  reports that he quit smoking about 25 years ago. His smoking use included cigarettes. He has never used smokeless tobacco. He reports current alcohol use of about 14.0 standard drinks of alcohol per week. He reports that he does not currently use drugs.The patient is alone today.  Allergies:  Allergies  Allergen Reactions   Testosterone Itching    Gel    Sulfa Antibiotics Rash    Current Medications: Current Outpatient Medications  Medication Sig Dispense Refill   acyclovir (ZOVIRAX) 200 MG capsule Take by mouth. (Patient not taking: Reported on 06/05/2022)     acyclovir (ZOVIRAX) 400 MG tablet Take 400 mg by mouth 3 (three) times daily. Taking once daily per patient     amitriptyline (ELAVIL) 25 MG tablet Take 50 mg by mouth at bedtime.     ergocalciferol (VITAMIN D2) 1.25 MG (50000 UT) capsule Take 50,000 Units by mouth every Wednesday.     hydrOXYzine (ATARAX) 25 MG tablet Take 1 tablet (25 mg total) by mouth 3 (three) times daily as needed for itching. (Patient not taking: Reported on 03/31/2022) 60 tablet 3   lidocaine-prilocaine (EMLA) cream Apply 1 Application topically as needed. 30 g 0   ondansetron (ZOFRAN) 4 MG tablet Take 1 tablet (4 mg total) by mouth every 4 (four) hours as needed for nausea. (Patient not taking: Reported on 03/31/2022) 90 tablet 3    predniSONE (DELTASONE) 10 MG tablet Take 1 tablet (10 mg total) by mouth daily with breakfast. 30 tablet 2   prochlorperazine (COMPAZINE) 10 MG tablet Take 1 tablet (10 mg total) by mouth every 6 (six) hours as needed for nausea or vomiting. (Patient not taking: Reported on 03/31/2022) 90 tablet 3   rivaroxaban (XARELTO) 20 MG TABS tablet Take 1 tablet (20 mg total) by mouth daily with supper. Start after completing 3 weeks of Xarelto 15 mg twice daily 30 tablet 3   tamsulosin (FLOMAX) 0.4 MG CAPS capsule Take 0.4 mg by mouth daily. (Patient not taking: Reported on 06/05/2022)     No current facility-administered medications for this visit.

## 2022-06-05 NOTE — Assessment & Plan Note (Addendum)
Stage IIB malignant melanoma of the right posterior shoulder with recurrence in the mid back in June of this year.  Biopsy revealed metastatic malignant melanoma.  Melan-A was positive S100 was positive SOX10 was positive and PRAME positive.  Staging PET in June revealed a focal hypermetabolic lesion of the left frontoparietal lobe, concerning for brain metastasis.  MRI brain confirmed the left frontoparietal lesion and revealed several other smaller lesions.  The solitary brain metastasis was treated with surgical resection, followed by stereotactic radiosurgery.  He was then placed on palliative ipilimumab/nivolumab.  He developed left facial weakness after his third cycle.  MRI of the brain was obtained.  This revealed left temporal resection cavity demonstrates thin linear enhancement along the resection margins, which may be postoperative, with additional small areas of somewhat more nodular enhancement, which are nonspecific but could indicate recurrent disease. Interval increase in the size of the right inferior frontal gyrus enhancing lesion with increased surrounding edema, mild local mass effect, and mild local midline shift.  Stable to slightly decreased size of the left pontine lesion with decreased associated edema.  Previously noted left inferior frontal gyrus lesion is no longer visualized. No definite new lesion.  He was placed on dexamethasone 4 mg twice daily, then decreased to 4 mg daily once his neurologic symptoms resolved. After discontinuation of the dexamethasone, he resumed immunotherapy receiving a 4th and final cycle of ipilimumab/nivolumab on October 9th.  He was due to start maintenance nivolumab, but has developed severe diarrhea felt to be immune mediated, so immunotherapy was held and he was treated with high-dose prednisone.  He has not been extremely compliant with this.  He is currently on prednisone 10 mg daily.  Nivolumab maintenance was started on November 27.  The patient now  has recurrent mild left-sided facial weakness despite prednisone 10 mg daily, which he is being given for generalized pruritus associated with immunotherapy. He is to follow up with Dr. Mickeal Skinner on December 18th.  He states an MRI has not been scheduled yet.  I will contact Dr. Mickeal Skinner regarding his recurrent symptoms. I told the patient it was ok to use the hydrocodone/APAP as needed.

## 2022-06-05 NOTE — Assessment & Plan Note (Signed)
Stable anemia of uncertain etiology, possibly due to malignancy. Evaluation did not reveal any nutritional deficiency, thyroid disease, or hemolysis contributing to the anemia.  His hemoglobin is stable.

## 2022-06-07 LAB — T4: T4, Total: 7.3 ug/dL (ref 4.5–12.0)

## 2022-06-11 ENCOUNTER — Encounter: Payer: Self-pay | Admitting: Oncology

## 2022-06-12 ENCOUNTER — Encounter: Payer: Self-pay | Admitting: Oncology

## 2022-06-12 ENCOUNTER — Inpatient Hospital Stay: Payer: Medicare Other

## 2022-06-12 ENCOUNTER — Inpatient Hospital Stay: Payer: Medicare Other | Attending: Oncology | Admitting: Oncology

## 2022-06-12 VITALS — BP 154/93 | HR 92 | Temp 98.6°F | Resp 18 | Ht 69.0 in | Wt 163.0 lb

## 2022-06-12 DIAGNOSIS — C439 Malignant melanoma of skin, unspecified: Secondary | ICD-10-CM

## 2022-06-12 DIAGNOSIS — C787 Secondary malignant neoplasm of liver and intrahepatic bile duct: Secondary | ICD-10-CM

## 2022-06-12 DIAGNOSIS — C7931 Secondary malignant neoplasm of brain: Secondary | ICD-10-CM | POA: Diagnosis not present

## 2022-06-12 DIAGNOSIS — Z5112 Encounter for antineoplastic immunotherapy: Secondary | ICD-10-CM | POA: Diagnosis not present

## 2022-06-12 DIAGNOSIS — C4361 Malignant melanoma of right upper limb, including shoulder: Secondary | ICD-10-CM | POA: Insufficient documentation

## 2022-06-12 DIAGNOSIS — C7800 Secondary malignant neoplasm of unspecified lung: Secondary | ICD-10-CM | POA: Diagnosis not present

## 2022-06-12 DIAGNOSIS — Z79899 Other long term (current) drug therapy: Secondary | ICD-10-CM | POA: Diagnosis not present

## 2022-06-12 DIAGNOSIS — C7802 Secondary malignant neoplasm of left lung: Secondary | ICD-10-CM | POA: Insufficient documentation

## 2022-06-12 DIAGNOSIS — C7801 Secondary malignant neoplasm of right lung: Secondary | ICD-10-CM | POA: Insufficient documentation

## 2022-06-12 DIAGNOSIS — Z87891 Personal history of nicotine dependence: Secondary | ICD-10-CM | POA: Diagnosis not present

## 2022-06-12 LAB — CMP (CANCER CENTER ONLY)
ALT: 14 U/L (ref 0–44)
AST: 14 U/L — ABNORMAL LOW (ref 15–41)
Albumin: 3.6 g/dL (ref 3.5–5.0)
Alkaline Phosphatase: 49 U/L (ref 38–126)
Anion gap: 8 (ref 5–15)
BUN: 20 mg/dL (ref 6–20)
CO2: 27 mmol/L (ref 22–32)
Calcium: 9 mg/dL (ref 8.9–10.3)
Chloride: 105 mmol/L (ref 98–111)
Creatinine: 0.9 mg/dL (ref 0.61–1.24)
GFR, Estimated: >60 mL/min (ref >60)
Glucose, Bld: 102 mg/dL — ABNORMAL HIGH (ref 70–99)
Potassium: 4 mmol/L (ref 3.5–5.1)
Sodium: 140 mmol/L (ref 135–145)
Total Bilirubin: 0.5 mg/dL (ref 0.3–1.2)
Total Protein: 6.4 g/dL — ABNORMAL LOW (ref 6.5–8.1)

## 2022-06-12 LAB — CBC WITH DIFFERENTIAL (CANCER CENTER ONLY)
Abs Immature Granulocytes: 0.09 10*3/uL — ABNORMAL HIGH (ref 0.00–0.07)
Basophils Absolute: 0.1 10*3/uL (ref 0.0–0.1)
Basophils Relative: 1 %
Eosinophils Absolute: 0 10*3/uL (ref 0.0–0.5)
Eosinophils Relative: 0 %
HCT: 38.2 % — ABNORMAL LOW (ref 39.0–52.0)
Hemoglobin: 12.1 g/dL — ABNORMAL LOW (ref 13.0–17.0)
Immature Granulocytes: 1 %
Lymphocytes Relative: 11 %
Lymphs Abs: 1 10*3/uL (ref 0.7–4.0)
MCH: 31.1 pg (ref 26.0–34.0)
MCHC: 31.7 g/dL (ref 30.0–36.0)
MCV: 98.2 fL (ref 80.0–100.0)
Monocytes Absolute: 0.5 10*3/uL (ref 0.1–1.0)
Monocytes Relative: 5 %
Neutro Abs: 7.9 10*3/uL — ABNORMAL HIGH (ref 1.7–7.7)
Neutrophils Relative %: 82 %
Platelet Count: 463 10*3/uL — ABNORMAL HIGH (ref 150–400)
RBC: 3.89 MIL/uL — ABNORMAL LOW (ref 4.22–5.81)
RDW: 17 % — ABNORMAL HIGH (ref 11.5–15.5)
WBC Count: 9.6 10*3/uL (ref 4.0–10.5)
nRBC: 0 % (ref 0.0–0.2)

## 2022-06-12 LAB — TSH: TSH: 1.001 u[IU]/mL (ref 0.350–4.500)

## 2022-06-13 ENCOUNTER — Other Ambulatory Visit: Payer: Self-pay

## 2022-06-13 ENCOUNTER — Encounter: Payer: Self-pay | Admitting: Oncology

## 2022-06-13 MED FILL — Nivolumab IV Soln 240 MG/24ML: INTRAVENOUS | Qty: 24 | Status: AC

## 2022-06-14 LAB — T4: T4, Total: 8.5 ug/dL (ref 4.5–12.0)

## 2022-06-16 ENCOUNTER — Inpatient Hospital Stay: Payer: Medicare Other

## 2022-06-16 ENCOUNTER — Encounter: Payer: Self-pay | Admitting: Oncology

## 2022-06-16 VITALS — BP 142/74 | HR 90 | Temp 98.2°F | Resp 18 | Ht 69.0 in | Wt 162.0 lb

## 2022-06-16 DIAGNOSIS — C439 Malignant melanoma of skin, unspecified: Secondary | ICD-10-CM

## 2022-06-16 DIAGNOSIS — C7800 Secondary malignant neoplasm of unspecified lung: Secondary | ICD-10-CM

## 2022-06-16 DIAGNOSIS — C787 Secondary malignant neoplasm of liver and intrahepatic bile duct: Secondary | ICD-10-CM

## 2022-06-16 DIAGNOSIS — C7801 Secondary malignant neoplasm of right lung: Secondary | ICD-10-CM | POA: Diagnosis not present

## 2022-06-16 DIAGNOSIS — C4361 Malignant melanoma of right upper limb, including shoulder: Secondary | ICD-10-CM | POA: Diagnosis not present

## 2022-06-16 DIAGNOSIS — C7931 Secondary malignant neoplasm of brain: Secondary | ICD-10-CM

## 2022-06-16 DIAGNOSIS — C7802 Secondary malignant neoplasm of left lung: Secondary | ICD-10-CM | POA: Diagnosis not present

## 2022-06-16 DIAGNOSIS — Z5112 Encounter for antineoplastic immunotherapy: Secondary | ICD-10-CM | POA: Diagnosis not present

## 2022-06-16 MED ORDER — SODIUM CHLORIDE 0.9% FLUSH: 10.0000 mL | INTRAVENOUS | Status: DC | PRN

## 2022-06-16 MED ORDER — SODIUM CHLORIDE 0.9 % IV SOLN: Freq: Once | INTRAVENOUS | Status: AC

## 2022-06-16 MED ORDER — SODIUM CHLORIDE 0.9 % IV SOLN
240.0000 mg | Freq: Once | INTRAVENOUS | Status: AC
  Administered 2022-06-16: 240 mg via INTRAVENOUS
  Filled 2022-06-16: qty 24

## 2022-06-16 MED ORDER — HEPARIN SOD (PORK) LOCK FLUSH 100 UNIT/ML IV SOLN: 500.0000 [IU] | Freq: Once | INTRAVENOUS | Status: DC | PRN

## 2022-06-16 NOTE — Patient Instructions (Signed)
Nivolumab Injection What is this medication? NIVOLUMAB (nye VOL ue mab) treats some types of cancer. It works by helping your immune system slow or stop the spread of cancer cells. It is a monoclonal antibody. This medicine may be used for other purposes; ask your health care provider or pharmacist if you have questions. COMMON BRAND NAME(S): Opdivo What should I tell my care team before I take this medication? They need to know if you have any of these conditions: Allogeneic stem cell transplant (uses someone else's stem cells) Autoimmune diseases, such as Crohn disease, ulcerative colitis, lupus History of chest radiation Nervous system problems, such as Guillain-Barre syndrome or myasthenia gravis Organ transplant An unusual or allergic reaction to nivolumab, other medications, foods, dyes, or preservatives Pregnant or trying to get pregnant Breast-feeding How should I use this medication? This medication is infused into a vein. It is given in a hospital or clinic setting. A special MedGuide will be given to you before each treatment. Be sure to read this information carefully each time. Talk to your care team about the use of this medication in children. While it may be prescribed for children as young as 12 years for selected conditions, precautions do apply. Overdosage: If you think you have taken too much of this medicine contact a poison control center or emergency room at once. NOTE: This medicine is only for you. Do not share this medicine with others. What if I miss a dose? Keep appointments for follow-up doses. It is important not to miss your dose. Call your care team if you are unable to keep an appointment. What may interact with this medication? Interactions have not been studied. This list may not describe all possible interactions. Give your health care provider a list of all the medicines, herbs, non-prescription drugs, or dietary supplements you use. Also tell them if you  smoke, drink alcohol, or use illegal drugs. Some items may interact with your medicine. What should I watch for while using this medication? Your condition will be monitored carefully while you are receiving this medication. You may need blood work while taking this medication. This medication may cause serious skin reactions. They can happen weeks to months after starting the medication. Contact your care team right away if you notice fevers or flu-like symptoms with a rash. The rash may be red or purple and then turn into blisters or peeling of the skin. You may also notice a red rash with swelling of the face, lips, or lymph nodes in your neck or under your arms. Tell your care team right away if you have any change in your eyesight. Talk to your care team if you are pregnant or think you might be pregnant. A negative pregnancy test is required before starting this medication. A reliable form of contraception is recommended while taking this medication and for 5 months after the last dose. Talk to your care team about effective forms of contraception. Do not breast-feed while taking this medication and for 5 months after the last dose. What side effects may I notice from receiving this medication? Side effects that you should report to your care team as soon as possible: Allergic reactions--skin rash, itching, hives, swelling of the face, lips, tongue, or throat Dry cough, shortness of breath or trouble breathing Eye pain, redness, irritation, or discharge with blurry or decreased vision Heart muscle inflammation--unusual weakness or fatigue, shortness of breath, chest pain, fast or irregular heartbeat, dizziness, swelling of the ankles, feet, or hands Hormone   gland problems--headache, sensitivity to light, unusual weakness or fatigue, dizziness, fast or irregular heartbeat, increased sensitivity to cold or heat, excessive sweating, constipation, hair loss, increased thirst or amount of urine,  tremors or shaking, irritability Infusion reactions--chest pain, shortness of breath or trouble breathing, feeling faint or lightheaded Kidney injury (glomerulonephritis)--decrease in the amount of urine, red or dark brown urine, foamy or bubbly urine, swelling of the ankles, hands, or feet Liver injury--right upper belly pain, loss of appetite, nausea, light-colored stool, dark yellow or brown urine, yellowing skin or eyes, unusual weakness or fatigue Pain, tingling, or numbness in the hands or feet, muscle weakness, change in vision, confusion or trouble speaking, loss of balance or coordination, trouble walking, seizures Rash, fever, and swollen lymph nodes Redness, blistering, peeling, or loosening of the skin, including inside the mouth Sudden or severe stomach pain, bloody diarrhea, fever, nausea, vomiting Side effects that usually do not require medical attention (report these to your care team if they continue or are bothersome): Bone, joint, or muscle pain Diarrhea Fatigue Loss of appetite Nausea Skin rash This list may not describe all possible side effects. Call your doctor for medical advice about side effects. You may report side effects to FDA at 1-800-FDA-1088. Where should I keep my medication? This medication is given in a hospital or clinic. It will not be stored at home. NOTE: This sheet is a summary. It may not cover all possible information. If you have questions about this medicine, talk to your doctor, pharmacist, or health care provider.  2023 [employer]/Gold Standard (2021-10-21 00:00:00)  

## 2022-06-18 ENCOUNTER — Ambulatory Visit (HOSPITAL_COMMUNITY)
Admission: RE | Admit: 2022-06-18 | Discharge: 2022-06-18 | Disposition: A | Discharge status: Home / Self Care | Payer: 59 | Source: Ambulatory Visit | Attending: Internal Medicine | Admitting: Internal Medicine

## 2022-06-18 DIAGNOSIS — C7931 Secondary malignant neoplasm of brain: Secondary | ICD-10-CM | POA: Diagnosis not present

## 2022-06-18 DIAGNOSIS — G9389 Other specified disorders of brain: Secondary | ICD-10-CM | POA: Diagnosis not present

## 2022-06-18 MED ORDER — GADOBUTROL 1 MMOL/ML IV SOLN
7.5000 mL | Freq: Once | INTRAVENOUS | Status: AC | PRN
  Administered 2022-06-18: 7.5 mL via INTRAVENOUS

## 2022-06-20 ENCOUNTER — Other Ambulatory Visit: Payer: Self-pay

## 2022-06-23 ENCOUNTER — Inpatient Hospital Stay: Payer: Medicare Other

## 2022-06-23 ENCOUNTER — Inpatient Hospital Stay (HOSPITAL_BASED_OUTPATIENT_CLINIC_OR_DEPARTMENT_OTHER): Payer: Medicare Other | Admitting: Internal Medicine

## 2022-06-23 VITALS — BP 155/99 | HR 111 | Temp 97.5°F | Resp 17 | Wt 166.8 lb

## 2022-06-23 DIAGNOSIS — C7931 Secondary malignant neoplasm of brain: Secondary | ICD-10-CM

## 2022-06-23 DIAGNOSIS — Z5112 Encounter for antineoplastic immunotherapy: Secondary | ICD-10-CM | POA: Diagnosis not present

## 2022-06-23 DIAGNOSIS — C787 Secondary malignant neoplasm of liver and intrahepatic bile duct: Secondary | ICD-10-CM | POA: Diagnosis not present

## 2022-06-23 DIAGNOSIS — C7802 Secondary malignant neoplasm of left lung: Secondary | ICD-10-CM | POA: Diagnosis not present

## 2022-06-23 DIAGNOSIS — C7801 Secondary malignant neoplasm of right lung: Secondary | ICD-10-CM | POA: Diagnosis not present

## 2022-06-23 DIAGNOSIS — C4361 Malignant melanoma of right upper limb, including shoulder: Secondary | ICD-10-CM | POA: Diagnosis not present

## 2022-06-23 MED ORDER — DEXAMETHASONE 2 MG PO TABS: 2.0000 mg | ORAL_TABLET | Freq: Every day | ORAL | 0 refills | Status: DC

## 2022-06-23 NOTE — Progress Notes (Signed)
[facility] [place], [place] [address] [phone]   Interval Evaluation  Date of Service: 06/23/22 Patient Name: Xavier Jones Patient MRN: [id number] Patient DOB: 06-Feb-1971 Provider: [name], MD  Identifying Statement:  Xavier Jones is a 51 y.o. male with Malignant melanoma metastatic to brain [facility])   Primary Cancer:  Oncologic History: Oncology History  Melanoma of skin ([place])  12/01/2019 Initial Diagnosis   Melanoma of skin ([place])   12/05/2019 Cancer Staging   Staging form: Melanoma of the Skin, AJCC 8th Edition - Clinical stage from 12/05/2019: Stage IIA (cT3a, cN0, cM0) - Signed by Derwood Kaplan, MD on 05/30/2020   02/18/2021 Genetic Testing   No pathogenic variants detected in [facility] Multi-Cancer +RNA Panel.  Variants of uncertain significance detected in APC (c.681C>G (p.Asp227Glu)), CDKN1C (c.610C>G (p.Pro204Ala)), and FH (c.1151C>T (p.Ala384Val)).  The report date is February 18, 2021.    The Multi-Cancer + RNA Panel offered by [facility] includes sequencing and/or deletion/duplication analysis of the following 84 genes:  AIP*, ALK, APC*, ATM*, AXIN2*, BAP1*, BARD1*, BLM*, BMPR1A*, BRCA1*, BRCA2*, BRIP1*, CASR, CDC73*, CDH1*, CDK4, CDKN1B*, CDKN1C*, CDKN2A, CEBPA, CHEK2*, CTNNA1*, DICER1*, DIS3L2*, EGFR, EPCAM, FH*, FLCN*, GATA2*, GPC3, GREM1, HOXB13, HRAS, KIT, MAX*, MEN1*, MET, MITF, MLH1*, MSH2*, MSH3*, MSH6*, MUTYH*, NBN*, NF1*, NF2*, NTHL1*, PALB2*, PDGFRA, PHOX2B, PMS2*, POLD1*, POLE*, POT1*, PRKAR1A*, PTCH1*, PTEN*, RAD50*, RAD51C*, RAD51D*, RB1*, RECQL4, RET, RUNX1*, SDHA*, SDHAF2*, SDHB*, SDHC*, SDHD*, SMAD4*, SMARCA4*, SMARCB1*, SMARCE1*, STK11*, SUFU*, TERC, TERT, TMEM127*, Tp53*, TSC1*, TSC2*, VHL*, WRN*, and WT1.  RNA analysis is performed for * genes.   01/30/2022 - 02/20/2022 Chemotherapy   Patient is on Treatment Plan : MELANOMA Nivolumab + Ipilimumab (1/3) q21d / Nivolumab q28d      01/30/2022 -  Chemotherapy   Patient is on Treatment Plan : MELANOMA Nivolumab (1) + Ipilimumab (3) q21d / Nivolumab (480) q28d     Malignant melanoma metastatic to brain ([place])  12/19/2021 Initial Diagnosis   Malignant melanoma metastatic to brain ([place])   01/30/2022 - 02/20/2022 Chemotherapy   Patient is on Treatment Plan : MELANOMA Nivolumab + Ipilimumab (1/3) q21d / Nivolumab q28d     01/30/2022 -  Chemotherapy   Patient is on Treatment Plan : MELANOMA Nivolumab (1) + Ipilimumab (3) q21d / Nivolumab (480) q28d     Malignant melanoma metastatic to lung ([place])  12/19/2021 Initial Diagnosis   Malignant melanoma metastatic to lung ([place])   01/30/2022 - 02/20/2022 Chemotherapy   Patient is on Treatment Plan : MELANOMA Nivolumab + Ipilimumab (1/3) q21d / Nivolumab q28d     01/30/2022 -  Chemotherapy   Patient is on Treatment Plan : MELANOMA Nivolumab (1) + Ipilimumab (3) q21d / Nivolumab (480) q28d     03/24/2022 Imaging   CTA chest:  IMPRESSION:  1. Small nonocclusive segmental level pulmonary embolism in the  inferior LEFT upper lobe.  2. Marked bronchial wall thickening with areas of patchy basilar  opacities and new ground-glass opacities in the upper lobes.  Constellation of findings may be infectious bronchiolitis/bronchitis  though the possibility of drug related changes in the setting of  immunotherapy are also considered. Furthermore, given basilar  predominance and some material in basilar bronchial structures would  correlate with any risk factors for or history of aspiration.  3. Response to therapy of bilateral metastatic lesions. Some lymph  nodes in the chest with interval increase in size are equivocal at  this time, potentially reactive, but warrant  attention on subsequent  imaging.    Malignant neoplasm metastatic to liver ([place])  12/19/2021 Initial Diagnosis   Malignant neoplasm metastatic to liver ([place])   01/30/2022 - 02/20/2022 Chemotherapy   Patient is on Treatment  Plan : MELANOMA Nivolumab + Ipilimumab (1/3) q21d / Nivolumab q28d     01/30/2022 -  Chemotherapy   Patient is on Treatment Plan : MELANOMA Nivolumab (1) + Ipilimumab (3) q21d / Nivolumab (480) q28d      CNS Oncologic History 01/02/22: SRS to 4 metastases, including pre-op L temporal Xavier Jones) 01/03/22: Craniotomy, resection L temporal Xavier Jones)  Interval History: Xavier Jones presents for follow up after recent MRI brain.  He describes some clinical changes since prior visit.  Headaches are occurring on the left side of the head, frequency is 3-4x per week, duration is "hours".  He has noticed increased difficulty closing his left eye, and family has noted some "twisting" of the left side of his face.  Finally, he describes increased urge to urinate and difficulty getting to bathroom in time, some difficulty emptying.  H+P (03/31/22) Patient presents today after 3 month post-SRS and craniotomy follow up.  He developed left sided weakness, mainly facial droop, 1 week ago.  Denies arm or leg weakness during that time.  He was started on decadron, dosed 11m TID x3 days, then down to 427mdaily thereafter.  His weakness has improved back to near baseline.  He has no other complaints today, no seizures, no issues with gait.  Medications: Current Outpatient Medications on File Prior to Visit  Medication Sig Dispense Refill   acyclovir (ZOVIRAX) 400 MG tablet Take 400 mg by mouth 3 (three) times daily. Taking once daily per patient     amitriptyline (ELAVIL) 25 MG tablet Take 50 mg by mouth at bedtime.     ergocalciferol (VITAMIN D2) 1.25 MG (50000 UT) capsule Take 50,000 Units by mouth every Wednesday.     hydrOXYzine (ATARAX) 25 MG tablet Take 1 tablet (25 mg total) by mouth 3 (three) times daily as needed for itching. 60 tablet 3   lidocaine-prilocaine (EMLA) cream Apply 1 Application topically as needed. 30 g 0   predniSONE (DELTASONE) 10 MG tablet Take 1 tablet (10 mg total) by mouth daily  with breakfast. 30 tablet 2   rivaroxaban (XARELTO) 20 MG TABS tablet Take 1 tablet (20 mg total) by mouth daily with supper. Start after completing 3 weeks of Xarelto 15 mg twice daily 30 tablet 3   ondansetron (ZOFRAN) 4 MG tablet Take 1 tablet (4 mg total) by mouth every 4 (four) hours as needed for nausea. (Patient not taking: Reported on 03/31/2022) 90 tablet 3   prochlorperazine (COMPAZINE) 10 MG tablet Take 1 tablet (10 mg total) by mouth every 6 (six) hours as needed for nausea or vomiting. (Patient not taking: Reported on 03/31/2022) 90 tablet 3   No current facility-administered medications on file prior to visit.    Allergies:  Allergies  Allergen Reactions   Testosterone Itching    Gel    Sulfa Antibiotics Rash   Past Medical History:  Past Medical History:  Diagnosis Date   Acute pulmonary embolism without acute cor pulmonale ([facility]/26/2023   Anemia 05/20/2022   Depression    mild   Family history of breast cancer 01/22/2021   Family history of prostate cancer 01/22/2021   Genital herpes    History of back injury 2003   Pruritic rash 02/18/2022   Skin cancer  melanoma   Past Surgical History:  Past Surgical History:  Procedure Laterality Date   APPLICATION OF CRANIAL NAVIGATION N/A 01/03/2022   Procedure: APPLICATION OF CRANIAL NAVIGATION;  Surgeon: Consuella Lose, MD;  Location: [facility];  Service: Neurosurgery;  Laterality: N/A;   CRANIOTOMY Left 01/03/2022   Procedure: Stereotactic Left temporal craniotomy for resection of tumor;  Surgeon: Consuella Lose, MD;  Location: [facility];  Service: Neurosurgery;  Laterality: Left;   MELANOMA EXCISION WITH SENTINEL LYMPH NODE BIOPSY Right 2021   SPINAL FUSION W/ LUQUE UNIT ROD  2003   Social History:  Social History   Socioeconomic History   Marital status: Divorced    Spouse name: Not on file   Number of children: 0   Years of education: Not on file   Highest education level: Not on file  Occupational History    Not on file  Tobacco Use   Smoking status: Former    Types: Cigarettes    Quit date: 1998    Years since quitting: 25.9   Smokeless tobacco: Never  Vaping Use   Vaping Use: Never used  Substance and Sexual Activity   Alcohol use: Yes    Alcohol/week: 14.0 standard drinks of alcohol    Types: 14 Cans of beer per week    Comment: 2-3 beers per evening   Drug use: Not Currently   Sexual activity: Not on file  Other Topics Concern   Not on file  Social History Narrative   Not on file   Social Determinants of Health   Financial Resource Strain: Not on file  Food Insecurity: Not on file  Transportation Needs: Not on file  Physical Activity: Not on file  Stress: Not on file  Social Connections: Not on file  Intimate Partner Violence: Not on file   Family History:  Family History  Problem Relation Age of Onset   Prostate cancer Father        dx 77s   Breast cancer Maternal Aunt        dx after 53; bilateral mastectomy   Stomach cancer Maternal Grandfather        dx 60s   Breast cancer Cousin 15       maternal male cousin; mets    Review of Systems: Constitutional: Doesn't report fevers, chills or abnormal weight loss Eyes: Doesn't report blurriness of vision Ears, nose, mouth, throat, and face: Doesn't report sore throat Respiratory: Doesn't report cough, dyspnea or wheezes Cardiovascular: Doesn't report palpitation, chest discomfort  Gastrointestinal:  Doesn't report nausea, constipation, diarrhea GU: Doesn't report incontinence Skin: Doesn't report skin rashes Neurological: Per HPI Musculoskeletal: Doesn't report joint pain Behavioral/Psych: Doesn't report anxiety  Physical Exam: Vitals:   06/23/22 1353  BP: (!) 155/99  Pulse: (!) 111  Resp: 17  Temp: (!) 97.5 F (36.4 C)  SpO2: 99%   KPS: 90. General: Alert, cooperative, pleasant, in no acute distress Head: Normal EENT: No conjunctival injection or scleral icterus.  Lungs: Resp effort  normal Cardiac: Regular rate Abdomen: Non-distended abdomen Skin: No rashes cyanosis or petechiae. Extremities: No clubbing or edema  Neurologic Exam: Mental Status: Awake, alert, attentive to examiner. Oriented to self and environment. Language is fluent with intact comprehension.  Cranial Nerves: Visual acuity is grossly normal. Visual fields are full. Extra-ocular movements intact. No ptosis. L UMN facial paresis, mild Motor: Tone and bulk are normal. Power is full in both arms and legs. Reflexes are symmetric, no pathologic reflexes present.  Sensory: Intact to light  touch Gait: Normal.   Labs: I have reviewed the data as listed    Component Value Date/Time   NA 140 06/12/2022 1118   NA 132 (A) 05/08/2022 0000   K 4.0 06/12/2022 1118   CL 105 06/12/2022 1118   CO2 27 06/12/2022 1118   GLUCOSE 102 (H) 06/12/2022 1118   BUN 20 06/12/2022 1118   BUN 11 05/08/2022 0000   CREATININE 0.90 06/12/2022 1118   CALCIUM 9.0 06/12/2022 1118   PROT 6.4 (L) 06/12/2022 1118   PROT 6.2 (A) 05/08/2022 0000   ALBUMIN 3.6 06/12/2022 1118   AST 14 (L) 06/12/2022 1118   ALT 14 06/12/2022 1118   ALKPHOS 49 06/12/2022 1118   BILITOT 0.5 06/12/2022 1118   GFRNONAA >60 06/12/2022 1118   Lab Results  Component Value Date   WBC 9.6 06/12/2022   NEUTROABS 7.9 (H) 06/12/2022   HGB 12.1 (L) 06/12/2022   HCT 38.2 (L) 06/12/2022   MCV 98.2 06/12/2022   PLT 463 (H) 06/12/2022    Imaging:  MR BRAIN W WO CONTRAST  Result Date: 06/19/2022 CLINICAL DATA:  Brain metastases EXAM: MRI HEAD WITHOUT AND WITH CONTRAST TECHNIQUE: Multiplanar, multiecho pulse sequences of the brain and surrounding structures were obtained without and with intravenous contrast. CONTRAST:  7.59m GADAVIST GADOBUTROL 1 MMOL/ML IV SOLN COMPARISON:  Brain MRI 04/15/2022 FINDINGS: Brain: Postsurgical changes reflecting left pterional craniotomy are again seen. There are persistent underlying intrinsically T1 hyperintense blood  products; however, there is also thin enhancement along the lateral aspect of the resection margin extending along the dura (100-185). There is no definite enhancement elsewhere about the resection cavity. Surrounding FLAIR signal abnormality is not significantly changed. Postoperative extra-axial fluid overlying the resection cavity is decreased. The 1.5 cm x 1.0 cm peripherally enhancing lesion in the right anteromedial frontal lobe is not significantly changed in size. Edema surrounding this lesion has slightly decreased. Intralesional hemorrhage is again seen. The intrinsically T1 hyperintense lesion in the left middle cerebellar peduncle measuring 1.4 cm x 1.1 cm is slightly increased in size, previously measured 1.0 cm x 1.0 cm. Surrounding edema is not significantly changed. There is a possible punctate lesion in the left cerebellar hemisphere with surrounding FLAIR signal abnormality (1100-128). This focus of enhancement was present on the study from 03/22/2022, and the FLAIR signal abnormality is unchanged going back to December 18, 2021. There are no definite new lesions. There is no acute intracranial hemorrhage, extra-axial fluid collection, or acute infarct. Parenchymal volume is stable. The ventricles are normal in size. There is no midline shift. Vascular: Normal flow voids. Skull and upper cervical spine: Normal marrow signal. Sinuses/Orbits: The paranasal sinuses are clear. Bilateral lens implants are in place. The globes and orbits are otherwise unremarkable. Other: None. IMPRESSION: 1. Persistent intrinsically T1 hyperintense blood products at the left temporal resection site. Linear enhancement along the lateral resection margin extending to the dura may reflect evolving postoperative change, though residual/recurrent disease is not entirely excluded. Recommend continued attention on follow-up. 2. Slight interval increase in size of the lesion in the left middle cerebellar peduncle with similar  surrounding edema. 3. No significant change in size of the lesion in the right anteromedial frontal lobe but with slightly decreased surrounding edema. 4. No new lesions. Electronically Signed   By: PValetta MoleM.D.   On: 06/19/2022 10:10    [facility] Clinician Interpretation: I have personally reviewed the radiological images as listed.  My interpretation, in the context of the patient's clinical  presentation, is likely treatment effect   Assessment/Plan Malignant melanoma metastatic to brain [facility])  Xavier Jones presents today with modest clinical changed (facial paresis, urge incontinence) which likely localize to left pontine tegmentum.  MRI brain demonstrates some inflammatory changes within the brainstem.  We recommended resuming decadron, though lower dose this time; will start with just 48m daily given overall mild symptoms.  Hopefully decadron can help with the headaches as well.  Dose should not interfere with planned immunotherapy, next infusion due 12/26.  We will give him a call in ~10 days to assess response to steroid intervention.  We appreciate the opportunity to participate in the care of JEMCOR   We otherwise ask that JPhilomena CourseSmeed return to clinic in 3 months following next brain MRI, or sooner as needed.    He will continue to follow with Dr. MHinton Raofor dual immunotherapy.    All questions were answered. The patient knows to call the clinic with any problems, questions or concerns. No barriers to learning were detected.  The total time spent in the encounter was 30 minutes and more than 50% was on counseling and review of test results   ZVentura Sellers MD Medical Director of Neuro-Oncology [facility]at [place]12/18/23 1:58 PM

## 2022-06-24 ENCOUNTER — Telehealth: Payer: Self-pay | Admitting: Internal Medicine

## 2022-06-24 NOTE — Telephone Encounter (Signed)
Called patient to schedule f/u. Patient notified of new appointments.

## 2022-06-25 ENCOUNTER — Other Ambulatory Visit: Payer: Self-pay

## 2022-06-26 ENCOUNTER — Inpatient Hospital Stay: Payer: Medicare Other

## 2022-06-26 ENCOUNTER — Inpatient Hospital Stay (INDEPENDENT_AMBULATORY_CARE_PROVIDER_SITE_OTHER): Payer: Medicare Other | Admitting: Hematology and Oncology

## 2022-06-26 ENCOUNTER — Encounter: Payer: Self-pay | Admitting: Hematology and Oncology

## 2022-06-26 DIAGNOSIS — C7801 Secondary malignant neoplasm of right lung: Secondary | ICD-10-CM | POA: Diagnosis not present

## 2022-06-26 DIAGNOSIS — C439 Malignant melanoma of skin, unspecified: Secondary | ICD-10-CM | POA: Diagnosis not present

## 2022-06-26 DIAGNOSIS — C7800 Secondary malignant neoplasm of unspecified lung: Secondary | ICD-10-CM

## 2022-06-26 DIAGNOSIS — C7931 Secondary malignant neoplasm of brain: Secondary | ICD-10-CM

## 2022-06-26 DIAGNOSIS — C787 Secondary malignant neoplasm of liver and intrahepatic bile duct: Secondary | ICD-10-CM

## 2022-06-26 DIAGNOSIS — Z5112 Encounter for antineoplastic immunotherapy: Secondary | ICD-10-CM | POA: Diagnosis not present

## 2022-06-26 DIAGNOSIS — C7802 Secondary malignant neoplasm of left lung: Secondary | ICD-10-CM | POA: Diagnosis not present

## 2022-06-26 DIAGNOSIS — C4361 Malignant melanoma of right upper limb, including shoulder: Secondary | ICD-10-CM | POA: Diagnosis not present

## 2022-06-26 LAB — CMP (CANCER CENTER ONLY)
ALT: 14 U/L (ref 0–44)
AST: 16 U/L (ref 15–41)
Albumin: 4 g/dL (ref 3.5–5.0)
Alkaline Phosphatase: 57 U/L (ref 38–126)
Anion gap: 10 (ref 5–15)
BUN: 14 mg/dL (ref 6–20)
CO2: 28 mmol/L (ref 22–32)
Calcium: 9.6 mg/dL (ref 8.9–10.3)
Chloride: 104 mmol/L (ref 98–111)
Creatinine: 0.8 mg/dL (ref 0.61–1.24)
GFR, Estimated: >60 mL/min (ref >60)
Glucose, Bld: 80 mg/dL (ref 70–99)
Potassium: 4 mmol/L (ref 3.5–5.1)
Sodium: 142 mmol/L (ref 135–145)
Total Bilirubin: 0.5 mg/dL (ref 0.3–1.2)
Total Protein: 7.3 g/dL (ref 6.5–8.1)

## 2022-06-26 LAB — CBC WITH DIFFERENTIAL (CANCER CENTER ONLY)
Abs Immature Granulocytes: 0.04 10*3/uL (ref 0.00–0.07)
Basophils Absolute: 0.1 10*3/uL (ref 0.0–0.1)
Basophils Relative: 1 %
Eosinophils Absolute: 0.2 10*3/uL (ref 0.0–0.5)
Eosinophils Relative: 2 %
HCT: 41.9 % (ref 39.0–52.0)
Hemoglobin: 13.4 g/dL (ref 13.0–17.0)
Immature Granulocytes: 1 %
Lymphocytes Relative: 18 %
Lymphs Abs: 1.4 10*3/uL (ref 0.7–4.0)
MCH: 31.2 pg (ref 26.0–34.0)
MCHC: 32 g/dL (ref 30.0–36.0)
MCV: 97.7 fL (ref 80.0–100.0)
Monocytes Absolute: 0.8 10*3/uL (ref 0.1–1.0)
Monocytes Relative: 11 %
Neutro Abs: 5.2 10*3/uL (ref 1.7–7.7)
Neutrophils Relative %: 67 %
Platelet Count: 394 10*3/uL (ref 150–400)
RBC: 4.29 MIL/uL (ref 4.22–5.81)
RDW: 15.4 % (ref 11.5–15.5)
WBC Count: 7.7 10*3/uL (ref 4.0–10.5)
nRBC: 0 % (ref 0.0–0.2)

## 2022-06-26 LAB — CBC AND DIFFERENTIAL

## 2022-06-26 LAB — TSH: TSH: 1.581 u[IU]/mL (ref 0.350–4.500)

## 2022-06-26 LAB — CBC

## 2022-06-26 NOTE — Progress Notes (Signed)
[name]  [address] [place],  [place]  [address] [phone]  Clinic Day:  06/26/2022  Referring physician: Earlyne Iba, NP  ASSESSMENT & PLAN:   Assessment & Plan: Malignant melanoma metastatic to lung [facility]) Bilateral hypermetabolic pulmonary nodules on PET in June, the largest measuring 2.3 cm, which had increased in size compared with the CT chest performed in May.  BRAF testing was negative. He was treated with 4 cycles of ipilimumab/nivolumab.   CTA in September done due to shortness of breath, revealed pulmonary embolism and changes consistent with bronchitis.  There was a decrease in the bilateral pulmonary nodules consistent with a response to therapy. There was interval increase in hilar and mediastinal lymph nodes, but this is commonly seen with immunotherapy.  He was placed on anticoagulation and treated for bronchitis.  Prior to starting maintenance nivolumab he developed acute onset diarrhea felt to be immune mediated, so immunotherapy was placed on hold and he was started on high dose prednisone.  He was not initially compliant with prednisone 20 mg 3 times daily, so was recommended for daily dosing.  His dose was later decreased to 40 mg daily, but then he was only taking 20 mg daily due to lower extremity edema.  He developed general pruritus, so was placed back on prednisone 10 mg daily.  Maintenance nivolumab was started on November 27.  He did not have recurrent diarrhea.  He states his itching is not severe, but the rash on his scalp is slightly increased.  Dr. Mickeal Skinner recommended switching him to dexamethasone 2 mg daily.  He will proceed with 6th nivolumab next week.  We will plan to see him back in 4 weeks prior to a 7th cycle.  Malignant melanoma metastatic to brain [facility]) Stage IIB malignant melanoma of the right posterior shoulder with recurrence in the mid back in June of this year.  Biopsy revealed metastatic malignant melanoma.   Melan-A was positive S100 was positive SOX10 was positive and PRAME positive.  Staging PET in June revealed a focal hypermetabolic lesion of the left frontoparietal lobe, concerning for brain metastasis.  MRI brain confirmed the left frontoparietal lesion and revealed several other smaller lesions.  The solitary brain metastasis was treated with surgical resection, followed by stereotactic radiosurgery.  He was then placed on palliative ipilimumab/nivolumab.  He developed left facial weakness after his third cycle.  MRI of the brain was obtained.  This revealed left temporal resection cavity demonstrates thin linear enhancement along the resection margins, which may be postoperative, with additional small areas of somewhat more nodular enhancement, which are nonspecific but could indicate recurrent disease. Interval increase in the size of the right inferior frontal gyrus enhancing lesion with increased surrounding edema, mild local mass effect, and mild local midline shift.  Stable to slightly decreased size of the left pontine lesion with decreased associated edema.  Previously noted left inferior frontal gyrus lesion is no longer visualized. No definite new lesion.  He was placed on dexamethasone 4 mg twice daily, then decreased to 4 mg daily once his neurologic symptoms resolved. After discontinuation of the dexamethasone, he resumed immunotherapy receiving a 4th and final cycle of ipilimumab/nivolumab on October 9th.  He was due to start maintenance nivolumab, but has developed severe diarrhea felt to be immune mediated, so immunotherapy was held and he was treated with high-dose prednisone.  He has not been extremely compliant with this.  He is currently on prednisone 10 mg daily.  Nivolumab maintenance was started on November 27.  The patient developed recurrent mild left-sided facial weakness despite prednisone 10 mg daily, which he is being given for generalized pruritus associated with immunotherapy.     MRI brain on December 13th revealed persistent intrinsically T1 hyperintense blood products at the left temporal resection site. Linear enhancement along the lateral resection margin extending to the dura may reflect evolving postoperative change, though residual/recurrent disease is not entirely excluded. Recommend continued attention on follow-up.  There was slight interval increase in size of the lesion in the left middle cerebellar peduncle with similar surrounding edema.  There was no significant change in size of the lesion in the right anteromedial frontal lobe but with slightly decreased surrounding edema. No new lesions were seen.  He saw Dr. Mickeal Skinner on December 18th and increased urgency of urination with occasional incontinence, in addition to his left facial weakness.  Both were felt to be due to the inflammatory changes in the brain, so Dr. Mickeal Skinner recommended discontinuing prednisone 10 mg daily and placing patient on dexamethasone 2 mg daily.  The patient has not yet picked up the dexamethasone, but plans to do so today.  He will follow-up with Dr. Mickeal Skinner as recommended.   The patient understands the plans discussed today and is in agreement with them.  He knows to contact our office if he develops concerns prior to his next appointment.   I provided 15 minutes of face-to-face time during this encounter and > 50% was spent counseling as documented under my assessment and plan.    [name], PA-C  [facility] AT [facility] [address] [place] [address] Dept: [phone] Dept Fax: [phone]   Orders Placed This Encounter  Procedures   CBC and differential    This external order was created through the Results Console.   CBC    This external order was created through the Results Console.      CHIEF COMPLAINT:  CC: Metastatic melanoma  Current Treatment: Maintenance nivolumab every 4 weeks  HISTORY OF  PRESENT ILLNESS:   Oncology History  Melanoma of skin ([place])  12/01/2019 Initial Diagnosis   Melanoma of skin ([place])   12/05/2019 Cancer Staging   Staging form: Melanoma of the Skin, AJCC 8th Edition - Clinical stage from 12/05/2019: Stage IIA (cT3a, cN0, cM0) - Signed by Derwood Kaplan, MD on 05/30/2020   02/18/2021 Genetic Testing   No pathogenic variants detected in [facility] Multi-Cancer +RNA Panel.  Variants of uncertain significance detected in APC (c.681C>G (p.Asp227Glu)), CDKN1C (c.610C>G (p.Pro204Ala)), and FH (c.1151C>T (p.Ala384Val)).  The report date is February 18, 2021.    The Multi-Cancer + RNA Panel offered by [facility] includes sequencing and/or deletion/duplication analysis of the following 84 genes:  AIP*, ALK, APC*, ATM*, AXIN2*, BAP1*, BARD1*, BLM*, BMPR1A*, BRCA1*, BRCA2*, BRIP1*, CASR, CDC73*, CDH1*, CDK4, CDKN1B*, CDKN1C*, CDKN2A, CEBPA, CHEK2*, CTNNA1*, DICER1*, DIS3L2*, EGFR, EPCAM, FH*, FLCN*, GATA2*, GPC3, GREM1, HOXB13, HRAS, KIT, MAX*, MEN1*, MET, MITF, MLH1*, MSH2*, MSH3*, MSH6*, MUTYH*, NBN*, NF1*, NF2*, NTHL1*, PALB2*, PDGFRA, PHOX2B, PMS2*, POLD1*, POLE*, POT1*, PRKAR1A*, PTCH1*, PTEN*, RAD50*, RAD51C*, RAD51D*, RB1*, RECQL4, RET, RUNX1*, SDHA*, SDHAF2*, SDHB*, SDHC*, SDHD*, SMAD4*, SMARCA4*, SMARCB1*, SMARCE1*, STK11*, SUFU*, TERC, TERT, TMEM127*, Tp53*, TSC1*, TSC2*, VHL*, WRN*, and WT1.  RNA analysis is performed for * genes.   01/30/2022 - 02/20/2022 Chemotherapy   Patient is on Treatment Plan : MELANOMA Nivolumab + Ipilimumab (1/3) q21d / Nivolumab q28d     01/30/2022 -  Chemotherapy   Patient is on Treatment Plan : MELANOMA Nivolumab (1) + Ipilimumab (3) q21d / Nivolumab (480) q28d     Malignant melanoma metastatic to brain ([place])  12/19/2021 Initial Diagnosis   Malignant melanoma metastatic to brain ([place])   01/30/2022 - 02/20/2022 Chemotherapy   Patient is on Treatment Plan : MELANOMA Nivolumab + Ipilimumab (1/3) q21d / Nivolumab q28d     01/30/2022 -   Chemotherapy   Patient is on Treatment Plan : MELANOMA Nivolumab (1) + Ipilimumab (3) q21d / Nivolumab (480) q28d     Malignant melanoma metastatic to lung ([place])  12/19/2021 Initial Diagnosis   Malignant melanoma metastatic to lung ([place])   01/30/2022 - 02/20/2022 Chemotherapy   Patient is on Treatment Plan : MELANOMA Nivolumab + Ipilimumab (1/3) q21d / Nivolumab q28d     01/30/2022 -  Chemotherapy   Patient is on Treatment Plan : MELANOMA Nivolumab (1) + Ipilimumab (3) q21d / Nivolumab (480) q28d     03/24/2022 Imaging   CTA chest:  IMPRESSION:  1. Small nonocclusive segmental level pulmonary embolism in the  inferior LEFT upper lobe.  2. Marked bronchial wall thickening with areas of patchy basilar  opacities and new ground-glass opacities in the upper lobes.  Constellation of findings may be infectious bronchiolitis/bronchitis  though the possibility of drug related changes in the setting of  immunotherapy are also considered. Furthermore, given basilar  predominance and some material in basilar bronchial structures would  correlate with any risk factors for or history of aspiration.  3. Response to therapy of bilateral metastatic lesions. Some lymph  nodes in the chest with interval increase in size are equivocal at  this time, potentially reactive, but warrant attention on subsequent  imaging.    Malignant neoplasm metastatic to liver ([place])  12/19/2021 Initial Diagnosis   Malignant neoplasm metastatic to liver ([place])   01/30/2022 - 02/20/2022 Chemotherapy   Patient is on Treatment Plan : MELANOMA Nivolumab + Ipilimumab (1/3) q21d / Nivolumab q28d     01/30/2022 -  Chemotherapy   Patient is on Treatment Plan : MELANOMA Nivolumab (1) + Ipilimumab (3) q21d / Nivolumab (480) q28d         INTERVAL HISTORY:  Xavier Jones is here today for repeat clinical assessment prior to his sixth cycle of nivolumab.  He states his itching is not severe but he has had slight increase in the scalp rash.   He saw Dr. Mickeal Skinner on Monday and he recommended changing his steroid to dexamethasone 2 mg daily, but he has not picked that up yet.  He plans to go to the pharmacy today.  He denies recurrent diarrhea.  He denies cough or shortness of breath.  He denies fevers or chills. He denies pain. His appetite is good. His weight has decreased 3 pounds over last 3 days .  REVIEW OF SYSTEMS:  Review of Systems  Constitutional:  Positive for fatigue. Negative for appetite change, chills, fever and unexpected weight change.  HENT:   Negative for lump/mass, mouth sores and sore throat.   Respiratory:  Negative for cough and shortness of breath.   Cardiovascular:  Negative for chest pain and leg swelling.  Gastrointestinal:  Negative for abdominal pain, constipation, diarrhea, nausea and vomiting.  Genitourinary:  Positive for bladder incontinence. Negative for difficulty urinating, dysuria, frequency and hematuria.   Musculoskeletal:  Negative for arthralgias, back pain and myalgias.  Skin:  Positive for itching and rash. Negative for wound.  Neurological:  Negative for dizziness, extremity  weakness, headaches, light-headedness and numbness.  Hematological:  Negative for adenopathy.  Psychiatric/Behavioral:  Negative for depression and sleep disturbance. The patient is not nervous/anxious.      VITALS:  Blood pressure (!) 139/98, pulse 90, temperature 98.8 F (37.1 C), temperature source Oral, resp. rate 20, height _0  (1.753 m), weight 163 lb (73.9 kg), SpO2 100 %.  Wt Readings from Last 3 Encounters:  06/26/22 163 lb (73.9 kg)  06/23/22 166 lb 12.8 oz (75.7 kg)  06/16/22 162 lb (73.5 kg)    Body mass index is 24.07 kg/m.  Performance status (ECOG): 1 - Symptomatic but completely ambulatory  PHYSICAL EXAM:  Physical Exam Vitals and nursing note reviewed.  Constitutional:      General: He is not in acute distress.    Appearance: Normal appearance. He is normal weight.  HENT:     Head:  Normocephalic and atraumatic.     Mouth/Throat:     Mouth: Mucous membranes are moist.     Pharynx: Oropharynx is clear. No oropharyngeal exudate or posterior oropharyngeal erythema.  Eyes:     General: No scleral icterus.    Extraocular Movements: Extraocular movements intact.     Conjunctiva/sclera: Conjunctivae normal.     Pupils: Pupils are equal, round, and reactive to light.  Cardiovascular:     Rate and Rhythm: Normal rate and regular rhythm.     Heart sounds: Normal heart sounds. No murmur heard.    No friction rub. No gallop.  Pulmonary:     Effort: Pulmonary effort is normal.     Breath sounds: Normal breath sounds. No wheezing, rhonchi or rales.  Abdominal:     General: Bowel sounds are normal. There is no distension.     Palpations: Abdomen is soft. There is no hepatomegaly, splenomegaly or mass.     Tenderness: There is no abdominal tenderness.  Musculoskeletal:        General: Normal range of motion.     Cervical back: Normal range of motion and neck supple. No tenderness.     Right lower leg: No edema.     Left lower leg: No edema.  Lymphadenopathy:     Cervical: No cervical adenopathy.     Upper Body:     Right upper body: No supraclavicular or axillary adenopathy.     Left upper body: No supraclavicular or axillary adenopathy.     Lower Body: No right inguinal adenopathy. No left inguinal adenopathy.  Skin:    General: Skin is warm and dry.     Coloration: Skin is not jaundiced.     Findings: Rash (Mild to moderate erythematous rash of scalp) present.  Neurological:     Mental Status: He is alert and oriented to person, place, and time.     Cranial Nerves: No cranial nerve deficit.  Psychiatric:        Mood and Affect: Mood normal.        Behavior: Behavior normal.        Thought Content: Thought content normal.     LABS:      Latest Ref Rng & Units 06/26/2022   11:13 AM 06/26/2022   12:00 AM 06/12/2022   11:18 AM  CBC  WBC 4.0 - 10.5 K/uL 7.7    9.6   Hemoglobin 13.0 - 17.0 g/dL 13.4  --  C    12.1   Hematocrit 39.0 - 52.0 % 41.9  --  C    38.2   Platelets 150 - 400  K/uL 394  --  C    463     C Corrected result   This result is from an external source.      Latest Ref Rng & Units 06/26/2022   11:13 AM 06/12/2022   11:18 AM 06/05/2022    8:48 AM  CMP  Glucose 70 - 99 mg/dL 80  102  98   BUN 6 - 20 mg/dL _0 Creatinine 0.61 - 1.24 mg/dL 0.80  0.90  0.70   Sodium 135 - 145 mmol/L 142  140  141   Potassium 3.5 - 5.1 mmol/L 4.0  4.0  4.4   Chloride 98 - 111 mmol/L 104  105  106   CO2 22 - 32 mmol/L _1 Calcium 8.9 - 10.3 mg/dL 9.6  9.0  9.2   Total Protein 6.5 - 8.1 g/dL 7.3  6.4  6.8   Total Bilirubin 0.3 - 1.2 mg/dL 0.5  0.5  0.5   Alkaline Phos 38 - 126 U/L 57  49  55   AST 15 - 41 U/L _2 ALT 0 - 44 U/L _3 No results found for: "CEA1", "CEA" / No results found for: "CEA1", "CEA" No results found for: "PSA1" No results found for: "KDX833" No results found for: "CAN125"  No results found for: "TOTALPROTELP", "ALBUMINELP", "A1GS", "A2GS", "BETS", "BETA2SER", "GAMS", "MSPIKE", "SPEI" Lab Results  Component Value Date   TIBC 297 05/21/2022   FERRITIN 41 05/21/2022   IRONPCTSAT 18 05/21/2022   Lab Results  Component Value Date   LDH 139 05/21/2022   LDH 145 12/19/2021    STUDIES:  MR BRAIN W WO CONTRAST  Result Date: 06/19/2022 CLINICAL DATA:  Brain metastases EXAM: MRI HEAD WITHOUT AND WITH CONTRAST TECHNIQUE: Multiplanar, multiecho pulse sequences of the brain and surrounding structures were obtained without and with intravenous contrast. CONTRAST:  7.36m GADAVIST GADOBUTROL 1 MMOL/ML IV SOLN COMPARISON:  Brain MRI 04/15/2022 FINDINGS: Brain: Postsurgical changes reflecting left pterional craniotomy are again seen. There are persistent underlying intrinsically T1 hyperintense blood products; however, there is also thin enhancement along the lateral aspect of the resection  margin extending along the dura (100-185). There is no definite enhancement elsewhere about the resection cavity. Surrounding FLAIR signal abnormality is not significantly changed. Postoperative extra-axial fluid overlying the resection cavity is decreased. The 1.5 cm x 1.0 cm peripherally enhancing lesion in the right anteromedial frontal lobe is not significantly changed in size. Edema surrounding this lesion has slightly decreased. Intralesional hemorrhage is again seen. The intrinsically T1 hyperintense lesion in the left middle cerebellar peduncle measuring 1.4 cm x 1.1 cm is slightly increased in size, previously measured 1.0 cm x 1.0 cm. Surrounding edema is not significantly changed. There is a possible punctate lesion in the left cerebellar hemisphere with surrounding FLAIR signal abnormality (1100-128). This focus of enhancement was present on the study from 03/22/2022, and the FLAIR signal abnormality is unchanged going back to December 18, 2021. There are no definite new lesions. There is no acute intracranial hemorrhage, extra-axial fluid collection, or acute infarct. Parenchymal volume is stable. The ventricles are normal in size. There is no midline shift. Vascular: Normal flow voids. Skull and upper cervical spine: Normal marrow signal. Sinuses/Orbits: The paranasal sinuses are clear. Bilateral lens implants are in place. The globes and orbits are otherwise unremarkable. Other: None. IMPRESSION: 1.  Persistent intrinsically T1 hyperintense blood products at the left temporal resection site. Linear enhancement along the lateral resection margin extending to the dura may reflect evolving postoperative change, though residual/recurrent disease is not entirely excluded. Recommend continued attention on follow-up. 2. Slight interval increase in size of the lesion in the left middle cerebellar peduncle with similar surrounding edema. 3. No significant change in size of the lesion in the right anteromedial  frontal lobe but with slightly decreased surrounding edema. 4. No new lesions. Electronically Signed   By: Valetta Mole M.D.   On: 06/19/2022 10:10      HISTORY:   Past Medical History:  Diagnosis Date   Acute pulmonary embolism without acute cor pulmonale ([place]) 05/01/2022   Anemia 05/20/2022   Depression    mild   Family history of breast cancer 01/22/2021   Family history of prostate cancer 01/22/2021   Genital herpes    History of back injury 2003   Pruritic rash 02/18/2022   Skin cancer    melanoma    Past Surgical History:  Procedure Laterality Date   APPLICATION OF CRANIAL NAVIGATION N/A 01/03/2022   Procedure: APPLICATION OF CRANIAL NAVIGATION;  Surgeon: Consuella Lose, MD;  Location: [facility];  Service: Neurosurgery;  Laterality: N/A;   CRANIOTOMY Left 01/03/2022   Procedure: Stereotactic Left temporal craniotomy for resection of tumor;  Surgeon: Consuella Lose, MD;  Location: [facility];  Service: Neurosurgery;  Laterality: Left;   MELANOMA EXCISION WITH SENTINEL LYMPH NODE BIOPSY Right 2021   SPINAL FUSION W/ LUQUE UNIT ROD  2003    Family History  Problem Relation Age of Onset   Prostate cancer Father        dx 78s   Breast cancer Maternal Aunt        dx after 79; bilateral mastectomy   Stomach cancer Maternal Grandfather        dx 51s   Breast cancer Cousin 63       maternal male cousin; mets    Social History:  reports that he quit smoking about 25 years ago. His smoking use included cigarettes. He has never used smokeless tobacco. He reports current alcohol use of about 14.0 standard drinks of alcohol per week. He reports that he does not currently use drugs.The patient is alone today.  Allergies:  Allergies  Allergen Reactions   Testosterone Itching    Gel    Sulfa Antibiotics Rash    Current Medications: Current Outpatient Medications  Medication Sig Dispense Refill   acyclovir (ZOVIRAX) 400 MG tablet Take 400 mg by mouth 3 (three) times daily.  Taking once daily per patient     amitriptyline (ELAVIL) 25 MG tablet Take 50 mg by mouth at bedtime.     dexamethasone (DECADRON) 2 MG tablet Take 1 tablet (2 mg total) by mouth daily. 30 tablet 0   ergocalciferol (VITAMIN D2) 1.25 MG (50000 UT) capsule Take 50,000 Units by mouth every Wednesday.     hydrOXYzine (ATARAX) 25 MG tablet Take 1 tablet (25 mg total) by mouth 3 (three) times daily as needed for itching. 60 tablet 3   lidocaine-prilocaine (EMLA) cream Apply 1 Application topically as needed. 30 g 0   ondansetron (ZOFRAN) 4 MG tablet Take 1 tablet (4 mg total) by mouth every 4 (four) hours as needed for nausea. (Patient not taking: Reported on 03/31/2022) 90 tablet 3   predniSONE (DELTASONE) 10 MG tablet Take 1 tablet (10 mg total) by mouth daily with breakfast. (Patient not taking:  Reported on 06/26/2022) 30 tablet 2   prochlorperazine (COMPAZINE) 10 MG tablet Take 1 tablet (10 mg total) by mouth every 6 (six) hours as needed for nausea or vomiting. (Patient not taking: Reported on 03/31/2022) 90 tablet 3   rivaroxaban (XARELTO) 20 MG TABS tablet Take 1 tablet (20 mg total) by mouth daily with supper. Start after completing 3 weeks of Xarelto 15 mg twice daily 30 tablet 3   No current facility-administered medications for this visit.

## 2022-06-26 NOTE — Assessment & Plan Note (Addendum)
Stage IIB malignant melanoma of the right posterior shoulder with recurrence in the mid back in June of this year.  Biopsy revealed metastatic malignant melanoma.  Melan-A was positive S100 was positive SOX10 was positive and PRAME positive.  Staging PET in June revealed a focal hypermetabolic lesion of the left frontoparietal lobe, concerning for brain metastasis.  MRI brain confirmed the left frontoparietal lesion and revealed several other smaller lesions.  The solitary brain metastasis was treated with surgical resection, followed by stereotactic radiosurgery.  He was then placed on palliative ipilimumab/nivolumab.  He developed left facial weakness after his third cycle.  MRI of the brain was obtained.  This revealed left temporal resection cavity demonstrates thin linear enhancement along the resection margins, which may be postoperative, with additional small areas of somewhat more nodular enhancement, which are nonspecific but could indicate recurrent disease. Interval increase in the size of the right inferior frontal gyrus enhancing lesion with increased surrounding edema, mild local mass effect, and mild local midline shift.  Stable to slightly decreased size of the left pontine lesion with decreased associated edema.  Previously noted left inferior frontal gyrus lesion is no longer visualized. No definite new lesion.  He was placed on dexamethasone 4 mg twice daily, then decreased to 4 mg daily once his neurologic symptoms resolved. After discontinuation of the dexamethasone, he resumed immunotherapy receiving a 4th and final cycle of ipilimumab/nivolumab on October 9th.  He was due to start maintenance nivolumab, but has developed severe diarrhea felt to be immune mediated, so immunotherapy was held and he was treated with high-dose prednisone.  He has not been extremely compliant with this.  He is currently on prednisone 10 mg daily.  Nivolumab maintenance was started on November 27.  The patient  developed recurrent mild left-sided facial weakness despite prednisone 10 mg daily, which he is being given for generalized pruritus associated with immunotherapy.    MRI brain on December 13th revealed persistent intrinsically T1 hyperintense blood products at the left temporal resection site. Linear enhancement along the lateral resection margin extending to the dura may reflect evolving postoperative change, though residual/recurrent disease is not entirely excluded. Recommend continued attention on follow-up.  There was slight interval increase in size of the lesion in the left middle cerebellar peduncle with similar surrounding edema.  There was no significant change in size of the lesion in the right anteromedial frontal lobe but with slightly decreased surrounding edema. No new lesions were seen.  He saw Dr. Mickeal Skinner on December 18th and increased urgency of urination with occasional incontinence, in addition to his left facial weakness.  Both were felt to be due to the inflammatory changes in the brain, so Dr. Mickeal Skinner recommended discontinuing prednisone 10 mg daily and placing patient on dexamethasone 2 mg daily.  The patient has not yet picked up the dexamethasone, but plans to do so today.  He will follow-up with Dr. Mickeal Skinner as recommended.

## 2022-06-26 NOTE — Assessment & Plan Note (Addendum)
Bilateral hypermetabolic pulmonary nodules on PET in June, the largest measuring 2.3 cm, which had increased in size compared with the CT chest performed in May.  BRAF testing was negative. He was treated with 4 cycles of ipilimumab/nivolumab.   CTA in September done due to shortness of breath, revealed pulmonary embolism and changes consistent with bronchitis.  There was a decrease in the bilateral pulmonary nodules consistent with a response to therapy. There was interval increase in hilar and mediastinal lymph nodes, but this is commonly seen with immunotherapy.  He was placed on anticoagulation and treated for bronchitis.  Prior to starting maintenance nivolumab he developed acute onset diarrhea felt to be immune mediated, so immunotherapy was placed on hold and he was started on high dose prednisone.  He was not initially compliant with prednisone 20 mg 3 times daily, so was recommended for daily dosing.  His dose was later decreased to 40 mg daily, but then he was only taking 20 mg daily due to lower extremity edema.  He developed general pruritus, so was placed back on prednisone 10 mg daily.  Maintenance nivolumab was started on November 27.  He did not have recurrent diarrhea.  He states his itching is not severe, but the rash on his scalp is slightly increased.  Dr. Mickeal Skinner recommended switching him to dexamethasone 2 mg daily.  He will proceed with 6th nivolumab next week.  We will plan to see him back in 4 weeks prior to a 7th cycle.

## 2022-06-27 MED FILL — Nivolumab IV Soln 100 MG/10ML: INTRAVENOUS | Qty: 48 | Status: AC

## 2022-06-28 LAB — T4: T4, Total: 7.2 ug/dL (ref 4.5–12.0)

## 2022-07-01 ENCOUNTER — Inpatient Hospital Stay: Payer: Medicare Other

## 2022-07-01 ENCOUNTER — Ambulatory Visit: Payer: 59

## 2022-07-01 VITALS — BP 141/74 | HR 88 | Temp 98.0°F | Resp 18 | Ht 69.0 in | Wt 166.0 lb

## 2022-07-01 DIAGNOSIS — C7800 Secondary malignant neoplasm of unspecified lung: Secondary | ICD-10-CM

## 2022-07-01 DIAGNOSIS — C787 Secondary malignant neoplasm of liver and intrahepatic bile duct: Secondary | ICD-10-CM

## 2022-07-01 DIAGNOSIS — C7931 Secondary malignant neoplasm of brain: Secondary | ICD-10-CM | POA: Diagnosis not present

## 2022-07-01 DIAGNOSIS — C7802 Secondary malignant neoplasm of left lung: Secondary | ICD-10-CM | POA: Diagnosis not present

## 2022-07-01 DIAGNOSIS — C7801 Secondary malignant neoplasm of right lung: Secondary | ICD-10-CM | POA: Diagnosis not present

## 2022-07-01 DIAGNOSIS — Z5112 Encounter for antineoplastic immunotherapy: Secondary | ICD-10-CM | POA: Diagnosis not present

## 2022-07-01 DIAGNOSIS — C4361 Malignant melanoma of right upper limb, including shoulder: Secondary | ICD-10-CM | POA: Diagnosis not present

## 2022-07-01 DIAGNOSIS — C439 Malignant melanoma of skin, unspecified: Secondary | ICD-10-CM

## 2022-07-01 MED ORDER — SODIUM CHLORIDE 0.9 % IV SOLN
480.0000 mg | Freq: Once | INTRAVENOUS | Status: AC
  Administered 2022-07-01: 480 mg via INTRAVENOUS
  Filled 2022-07-01: qty 48

## 2022-07-01 MED ORDER — HEPARIN SOD (PORK) LOCK FLUSH 100 UNIT/ML IV SOLN: 500.0000 [IU] | Freq: Once | INTRAVENOUS | Status: DC | PRN

## 2022-07-01 MED ORDER — SODIUM CHLORIDE 0.9 % IV SOLN: Freq: Once | INTRAVENOUS | Status: AC

## 2022-07-01 MED ORDER — SODIUM CHLORIDE 0.9% FLUSH: 10.0000 mL | INTRAVENOUS | Status: DC | PRN

## 2022-07-01 NOTE — Patient Instructions (Signed)
Nivolumab Injection What is this medication? NIVOLUMAB (nye VOL ue mab) treats some types of cancer. It works by helping your immune system slow or stop the spread of cancer cells. It is a monoclonal antibody. This medicine may be used for other purposes; ask your health care provider or pharmacist if you have questions. COMMON BRAND NAME(S): Opdivo What should I tell my care team before I take this medication? They need to know if you have any of these conditions: Allogeneic stem cell transplant (uses someone else's stem cells) Autoimmune diseases, such as Crohn disease, ulcerative colitis, lupus History of chest radiation Nervous system problems, such as Guillain-Barre syndrome or myasthenia gravis Organ transplant An unusual or allergic reaction to nivolumab, other medications, foods, dyes, or preservatives Pregnant or trying to get pregnant Breast-feeding How should I use this medication? This medication is infused into a vein. It is given in a hospital or clinic setting. A special MedGuide will be given to you before each treatment. Be sure to read this information carefully each time. Talk to your care team about the use of this medication in children. While it may be prescribed for children as young as 12 years for selected conditions, precautions do apply. Overdosage: If you think you have taken too much of this medicine contact a poison control center or emergency room at once. NOTE: This medicine is only for you. Do not share this medicine with others. What if I miss a dose? Keep appointments for follow-up doses. It is important not to miss your dose. Call your care team if you are unable to keep an appointment. What may interact with this medication? Interactions have not been studied. This list may not describe all possible interactions. Give your health care provider a list of all the medicines, herbs, non-prescription drugs, or dietary supplements you use. Also tell them if you  smoke, drink alcohol, or use illegal drugs. Some items may interact with your medicine. What should I watch for while using this medication? Your condition will be monitored carefully while you are receiving this medication. You may need blood work while taking this medication. This medication may cause serious skin reactions. They can happen weeks to months after starting the medication. Contact your care team right away if you notice fevers or flu-like symptoms with a rash. The rash may be red or purple and then turn into blisters or peeling of the skin. You may also notice a red rash with swelling of the face, lips, or lymph nodes in your neck or under your arms. Tell your care team right away if you have any change in your eyesight. Talk to your care team if you are pregnant or think you might be pregnant. A negative pregnancy test is required before starting this medication. A reliable form of contraception is recommended while taking this medication and for 5 months after the last dose. Talk to your care team about effective forms of contraception. Do not breast-feed while taking this medication and for 5 months after the last dose. What side effects may I notice from receiving this medication? Side effects that you should report to your care team as soon as possible: Allergic reactions--skin rash, itching, hives, swelling of the face, lips, tongue, or throat Dry cough, shortness of breath or trouble breathing Eye pain, redness, irritation, or discharge with blurry or decreased vision Heart muscle inflammation--unusual weakness or fatigue, shortness of breath, chest pain, fast or irregular heartbeat, dizziness, swelling of the ankles, feet, or hands Hormone   gland problems--headache, sensitivity to light, unusual weakness or fatigue, dizziness, fast or irregular heartbeat, increased sensitivity to cold or heat, excessive sweating, constipation, hair loss, increased thirst or amount of urine,  tremors or shaking, irritability Infusion reactions--chest pain, shortness of breath or trouble breathing, feeling faint or lightheaded Kidney injury (glomerulonephritis)--decrease in the amount of urine, red or dark brown urine, foamy or bubbly urine, swelling of the ankles, hands, or feet Liver injury--right upper belly pain, loss of appetite, nausea, light-colored stool, dark yellow or brown urine, yellowing skin or eyes, unusual weakness or fatigue Pain, tingling, or numbness in the hands or feet, muscle weakness, change in vision, confusion or trouble speaking, loss of balance or coordination, trouble walking, seizures Rash, fever, and swollen lymph nodes Redness, blistering, peeling, or loosening of the skin, including inside the mouth Sudden or severe stomach pain, bloody diarrhea, fever, nausea, vomiting Side effects that usually do not require medical attention (report these to your care team if they continue or are bothersome): Bone, joint, or muscle pain Diarrhea Fatigue Loss of appetite Nausea Skin rash This list may not describe all possible side effects. Call your doctor for medical advice about side effects. You may report side effects to FDA at 1-800-FDA-1088. Where should I keep my medication? This medication is given in a hospital or clinic. It will not be stored at home. NOTE: This sheet is a summary. It may not cover all possible information. If you have questions about this medicine, talk to your doctor, pharmacist, or health care provider.  2023 [employer]/Gold Standard (2021-10-21 00:00:00)  

## 2022-07-02 ENCOUNTER — Encounter: Payer: Self-pay | Admitting: Oncology

## 2022-07-08 ENCOUNTER — Inpatient Hospital Stay: Payer: 59 | Attending: Internal Medicine | Admitting: Internal Medicine

## 2022-07-08 ENCOUNTER — Encounter: Payer: Self-pay | Admitting: Oncology

## 2022-07-08 DIAGNOSIS — C7931 Secondary malignant neoplasm of brain: Secondary | ICD-10-CM | POA: Diagnosis not present

## 2022-07-08 NOTE — Progress Notes (Signed)
I connected with Xavier Jones on 07/08/22 at 11:00 AM EST by telephone visit and verified that I am speaking with the correct person using two identifiers.  I discussed the limitations, risks, security and privacy concerns of performing an evaluation and management service by telemedicine and the availability of in-person appointments. I also discussed with the patient that there may be a patient responsible charge related to this service. The patient expressed understanding and agreed to proceed.  Other persons participating in the visit and their role in the encounter:  n/a   Patient's location:  Home Provider's location:  Office Chief Complaint:  Malignant melanoma metastatic to brain [facility])  History of Present Ilness: Xavier Course Rathbun reports no clinical changes today.  The decadron did not lead to any clear changes.  Continues to have some difficulty closing his left eye, urinary urgency.  Otherwise no new or progressive changes.  Did get nivolumab on 12/26 as scheduled.  Observations: Language and cognition at baseline  Assessment and Plan: Malignant melanoma metastatic to brain [facility])  Follow Up Instructions:  May d/c decadron due to lack of efficacy, no taper needed given less than 14 days treatment.  RTC as scheduled in March following MRI.  I discussed the assessment and treatment plan with the patient.  The patient was provided an opportunity to ask questions and all were answered.  The patient agreed with the plan and demonstrated understanding of the instructions.    The patient was advised to call back or seek an in-person evaluation if the symptoms worsen or if the condition fails to improve as anticipated.    [name], MD   I provided 15 minutes of non face-to-face telephone visit time during this encounter, and > 50% was spent counseling as documented under my assessment & plan.

## 2022-07-11 ENCOUNTER — Telehealth: Payer: Self-pay | Admitting: Hematology and Oncology

## 2022-07-11 ENCOUNTER — Other Ambulatory Visit: Payer: Self-pay | Admitting: Hematology and Oncology

## 2022-07-11 DIAGNOSIS — C7931 Secondary malignant neoplasm of brain: Secondary | ICD-10-CM

## 2022-07-11 DIAGNOSIS — C787 Secondary malignant neoplasm of liver and intrahepatic bile duct: Secondary | ICD-10-CM

## 2022-07-11 DIAGNOSIS — C439 Malignant melanoma of skin, unspecified: Secondary | ICD-10-CM

## 2022-07-11 DIAGNOSIS — C7800 Secondary malignant neoplasm of unspecified lung: Secondary | ICD-10-CM

## 2022-07-11 NOTE — Telephone Encounter (Signed)
07/11/22 Spoke with patient and scheduled CT SCAN on 07/22/22'@930am'$ 

## 2022-07-17 ENCOUNTER — Encounter: Payer: Self-pay | Admitting: Oncology

## 2022-07-18 ENCOUNTER — Encounter: Payer: Self-pay | Admitting: Oncology

## 2022-07-21 ENCOUNTER — Telehealth: Payer: Self-pay

## 2022-07-21 NOTE — Telephone Encounter (Signed)
Patient called to report over the last week he has had left ear pressure and is unable to walk a straight line. He reports "stumbling to the left." No other symptoms notes.  Routed to provider.

## 2022-07-22 ENCOUNTER — Telehealth: Payer: Self-pay

## 2022-07-22 ENCOUNTER — Other Ambulatory Visit: Payer: Self-pay | Admitting: Internal Medicine

## 2022-07-22 DIAGNOSIS — R918 Other nonspecific abnormal finding of lung field: Secondary | ICD-10-CM | POA: Diagnosis not present

## 2022-07-22 DIAGNOSIS — C439 Malignant melanoma of skin, unspecified: Secondary | ICD-10-CM | POA: Diagnosis not present

## 2022-07-22 DIAGNOSIS — I2510 Atherosclerotic heart disease of native coronary artery without angina pectoris: Secondary | ICD-10-CM | POA: Diagnosis not present

## 2022-07-22 DIAGNOSIS — R590 Localized enlarged lymph nodes: Secondary | ICD-10-CM | POA: Diagnosis not present

## 2022-07-22 DIAGNOSIS — C787 Secondary malignant neoplasm of liver and intrahepatic bile duct: Secondary | ICD-10-CM | POA: Diagnosis not present

## 2022-07-22 DIAGNOSIS — M47814 Spondylosis without myelopathy or radiculopathy, thoracic region: Secondary | ICD-10-CM | POA: Diagnosis not present

## 2022-07-22 DIAGNOSIS — I700 Atherosclerosis of aorta: Secondary | ICD-10-CM | POA: Diagnosis not present

## 2022-07-22 DIAGNOSIS — C4361 Malignant melanoma of right upper limb, including shoulder: Secondary | ICD-10-CM | POA: Diagnosis not present

## 2022-07-22 DIAGNOSIS — C7800 Secondary malignant neoplasm of unspecified lung: Secondary | ICD-10-CM | POA: Diagnosis not present

## 2022-07-22 MED ORDER — DEXAMETHASONE 2 MG PO TABS: 2.0000 mg | ORAL_TABLET | Freq: Two times a day (BID) | ORAL | 0 refills | Status: DC

## 2022-07-22 NOTE — Telephone Encounter (Signed)
Patient called back regarding balance issues and ear fullness. Per Dr. Mickeal Skinner, the ear fullness is not attrivuted to any CNS tumor burden that he is currently being treated for, however the balance issues could be related. Dr. Mickeal Skinner would recommend trying another decadron challenge. Patient agreeable to decadron challenge. Patient informed that he will take 4 mg (2 mg tablets) total dose of decadron daily. Patient may take two tablets in the morning or 1 tablet in the morning and one in the evening. Patient verbalized an understanding of medication administration and is aware that a refill had been sent in to his preferred pharmacy.  Patient scheduled for follow-up phone visit with Dr. Mickeal Skinner on Thursday, 1/25 at 10:00 AM to review effectiveness of treatment. Patient aware of new appointment and verbalized an understanding of the information.  Patient reports no falls at this time and feels as though he is "stumbling". Dr. Mickeal Skinner made aware.

## 2022-07-24 ENCOUNTER — Encounter: Payer: Self-pay | Admitting: Hematology and Oncology

## 2022-07-24 ENCOUNTER — Ambulatory Visit: Payer: Medicare Other | Admitting: Hematology and Oncology

## 2022-07-24 ENCOUNTER — Inpatient Hospital Stay: Payer: Medicare Other | Attending: Hematology and Oncology

## 2022-07-24 ENCOUNTER — Encounter: Payer: Self-pay | Admitting: Oncology

## 2022-07-24 ENCOUNTER — Other Ambulatory Visit: Payer: Medicare Other

## 2022-07-24 ENCOUNTER — Inpatient Hospital Stay (INDEPENDENT_AMBULATORY_CARE_PROVIDER_SITE_OTHER): Payer: 59 | Admitting: Hematology and Oncology

## 2022-07-24 VITALS — BP 150/99 | HR 98 | Temp 98.9°F | Resp 18 | Ht 69.0 in | Wt 167.2 lb

## 2022-07-24 DIAGNOSIS — C4361 Malignant melanoma of right upper limb, including shoulder: Secondary | ICD-10-CM | POA: Insufficient documentation

## 2022-07-24 DIAGNOSIS — C7802 Secondary malignant neoplasm of left lung: Secondary | ICD-10-CM | POA: Diagnosis not present

## 2022-07-24 DIAGNOSIS — I2699 Other pulmonary embolism without acute cor pulmonale: Secondary | ICD-10-CM

## 2022-07-24 DIAGNOSIS — C7801 Secondary malignant neoplasm of right lung: Secondary | ICD-10-CM | POA: Diagnosis not present

## 2022-07-24 DIAGNOSIS — C7931 Secondary malignant neoplasm of brain: Secondary | ICD-10-CM

## 2022-07-24 DIAGNOSIS — C439 Malignant melanoma of skin, unspecified: Secondary | ICD-10-CM | POA: Diagnosis not present

## 2022-07-24 DIAGNOSIS — I493 Ventricular premature depolarization: Secondary | ICD-10-CM | POA: Diagnosis not present

## 2022-07-24 DIAGNOSIS — C7800 Secondary malignant neoplasm of unspecified lung: Secondary | ICD-10-CM

## 2022-07-24 DIAGNOSIS — Z452 Encounter for adjustment and management of vascular access device: Secondary | ICD-10-CM | POA: Diagnosis not present

## 2022-07-24 DIAGNOSIS — C787 Secondary malignant neoplasm of liver and intrahepatic bile duct: Secondary | ICD-10-CM | POA: Diagnosis not present

## 2022-07-24 DIAGNOSIS — Z87891 Personal history of nicotine dependence: Secondary | ICD-10-CM | POA: Diagnosis not present

## 2022-07-24 DIAGNOSIS — Z79899 Other long term (current) drug therapy: Secondary | ICD-10-CM | POA: Insufficient documentation

## 2022-07-24 LAB — CMP (CANCER CENTER ONLY)
ALT: 13 U/L (ref 0–44)
AST: 16 U/L (ref 15–41)
Albumin: 4.5 g/dL (ref 3.5–5.0)
Alkaline Phosphatase: 59 U/L (ref 38–126)
Anion gap: 9 (ref 5–15)
BUN: 18 mg/dL (ref 6–20)
CO2: 28 mmol/L (ref 22–32)
Calcium: 9.6 mg/dL (ref 8.9–10.3)
Chloride: 103 mmol/L (ref 98–111)
Creatinine: 1.04 mg/dL (ref 0.61–1.24)
GFR, Estimated: >60 mL/min (ref >60)
Glucose, Bld: 99 mg/dL (ref 70–99)
Potassium: 4.1 mmol/L (ref 3.5–5.1)
Sodium: 140 mmol/L (ref 135–145)
Total Bilirubin: 0.3 mg/dL (ref 0.3–1.2)
Total Protein: 7.8 g/dL (ref 6.5–8.1)

## 2022-07-24 LAB — CBC WITH DIFFERENTIAL (CANCER CENTER ONLY)
Abs Immature Granulocytes: 0.02 10*3/uL (ref 0.00–0.07)
Basophils Absolute: 0 10*3/uL (ref 0.0–0.1)
Basophils Relative: 1 %
Eosinophils Absolute: 0 10*3/uL (ref 0.0–0.5)
Eosinophils Relative: 0 %
HCT: 43.1 % (ref 39.0–52.0)
Hemoglobin: 14 g/dL (ref 13.0–17.0)
Immature Granulocytes: 0 %
Lymphocytes Relative: 16 %
Lymphs Abs: 1.1 10*3/uL (ref 0.7–4.0)
MCH: 32.1 pg (ref 26.0–34.0)
MCHC: 32.5 g/dL (ref 30.0–36.0)
MCV: 98.9 fL (ref 80.0–100.0)
Monocytes Absolute: 0.7 10*3/uL (ref 0.1–1.0)
Monocytes Relative: 10 %
Neutro Abs: 4.9 10*3/uL (ref 1.7–7.7)
Neutrophils Relative %: 73 %
Platelet Count: 419 10*3/uL — ABNORMAL HIGH (ref 150–400)
RBC: 4.36 MIL/uL (ref 4.22–5.81)
RDW: 12.6 % (ref 11.5–15.5)
WBC Count: 6.8 10*3/uL (ref 4.0–10.5)
nRBC: 0 % (ref 0.0–0.2)

## 2022-07-24 LAB — TSH: TSH: 1.55 u[IU]/mL (ref 0.350–4.500)

## 2022-07-24 MED ORDER — TRIAMCINOLONE ACETONIDE 0.025 % EX CREA: 1.0000 | TOPICAL_CREAM | Freq: Two times a day (BID) | CUTANEOUS | 2 refills | Status: DC

## 2022-07-24 MED ORDER — TRIAMCINOLONE ACETONIDE 0.025 % EX OINT: 1.0000 | TOPICAL_OINTMENT | Freq: Two times a day (BID) | CUTANEOUS | 0 refills | Status: DC

## 2022-07-24 NOTE — Assessment & Plan Note (Addendum)
Non occlusive segmental pulmonary embolism in the inferior left upper lobe diagnosed in September.  He remains on rivaroxaban 20 mg daily.  He denies abnormal bruising or bleeding.  We could consider discontinuing rivaroxaban after 6 months of therapy.

## 2022-07-24 NOTE — Progress Notes (Signed)
[facility]  [address] [place],  [place]  [address] [phone]  Clinic Day:  07/24/2022  Referring physician: Earlyne Iba, NP  ASSESSMENT & PLAN:   Assessment & Plan: Malignant melanoma metastatic to lung [facility]) Recurrent metastatic melanoma diagnosed in June 2023.  Bilateral hypermetabolic pulmonary nodules were seen on PET, the largest measuring 2.3 cm, which had increased in size compared with the CT chest performed in May.  BRAF testing was negative. He was treated with 4 cycles of ipilimumab/nivolumab.   CTA in September done due to shortness of breath, revealed a small nonocclusive segmental pulmonary embolism in the left upper lobe, as well as changes consistent with bronchitis.  There was a decrease in the bilateral pulmonary nodules consistent with a response to therapy. There was interval increase in hilar and mediastinal lymph nodes, but this is commonly seen with immunotherapy.  He was placed on anticoagulation and treated for bronchitis.  Prior to starting maintenance nivolumab he developed acute onset diarrhea felt to be immune mediated, so immunotherapy was placed on hold and he was started on high dose prednisone.  He was not initially compliant with prednisone 20 mg 3 times daily, so was recommended for daily dosing.  His dose was later decreased to 40 mg daily, but then he was only taking 20 mg daily due to lower extremity edema.  He was to wear off prednisone, but then had general pruritus, so was placed back on prednisone 10 mg daily.  Maintenance nivolumab was started in November.  He did not have recurrent diarrhea.  He has had persistent pruritus, which is not severe.  Dr. Mickeal Skinner recommended switching him to dexamethasone 2 mg daily in December due to persistent neurologic signs and symptoms.   CT chest, abdomen and pelvis on January 16th reveals a response to therapy with resolution of the subcarinal and hilar lymphadenopathy and stable  to decreased left pulmonary nodules, without evidence of new or progressive metastatic disease.  He will proceed with a 8th cycle of nivolumab next week.  We will plan to see him back in 2 weeks for repeat clinical assessment prior to a 9th cycle of nivolumab.  Malignant melanoma metastatic to brain [facility]) Stage IIB malignant melanoma of the right posterior shoulder with recurrence in the mid back in June of this year.  Biopsy revealed metastatic malignant melanoma.  Melan-A was positive S100 was positive SOX10 was positive and PRAME positive.  Staging PET in June revealed a focal hypermetabolic lesion of the left frontoparietal lobe, concerning for brain metastasis.  MRI brain confirmed the left frontoparietal lesion and revealed several other smaller lesions.  The solitary brain metastasis was treated with surgical resection, followed by stereotactic radiosurgery.  He was then placed on palliative ipilimumab/nivolumab.  He developed left facial weakness after his third cycle.  MRI of the brain was obtained.  This revealed left temporal resection cavity demonstrates thin linear enhancement along the resection margins, which may be postoperative, with additional small areas of somewhat more nodular enhancement, which are nonspecific but could indicate recurrent disease. Interval increase in the size of the right inferior frontal gyrus enhancing lesion with increased surrounding edema, mild local mass effect, and mild local midline shift.  Stable to slightly decreased size of the left pontine lesion with decreased associated edema.  Previously noted left inferior frontal gyrus lesion is no longer visualized. No definite new lesion.  He was placed on dexamethasone 4 mg twice daily, then decreased to 4  mg daily once his neurologic symptoms resolved. After discontinuation of the dexamethasone, he resumed immunotherapy receiving a 4th and final cycle of ipilimumab/nivolumab on October 9th.  He was due to start  maintenance nivolumab, but has developed severe diarrhea felt to be immune mediated, so immunotherapy was held and he was treated with high-dose prednisone.  He has not been extremely compliant with this.  He is currently on prednisone 10 mg daily.  Nivolumab maintenance was started on November 27.  The patient developed recurrent mild left-sided facial weakness despite prednisone 10 mg daily, which he is being given for generalized pruritus associated with immunotherapy.    MRI brain on December 13th revealed persistent intrinsically T1 hyperintense blood products at the left temporal resection site. Linear enhancement along the lateral resection margin extending to the dura may reflect evolving postoperative change, though residual/recurrent disease is not entirely excluded. Recommend continued attention on follow-up.  There was slight interval increase in size of the lesion in the left middle cerebellar peduncle with similar surrounding edema.  There was no significant change in size of the lesion in the right anteromedial frontal lobe but with slightly decreased surrounding edema. No new lesions were seen.  He saw Dr. Mickeal Skinner on December 18th and reported increased urgency of urination with occasional incontinence, in addition to his left facial weakness.  Both were felt to be due to the inflammatory changes in the brain, so Dr. Mickeal Skinner recommended discontinuing prednisone 10 mg daily and placing patient on dexamethasone 2 mg daily.  Dr. Mickeal Skinner discontinued dexamethasone 2 mg daily, as he had persistent mild left facial weakness and urinary symptoms.  The patient states he has had worsening left facial weakness, as well as balance issues with stumbling to the left.  So he contacted Dr. Renda Rolls office and was on dexamethasone 4 mg daily earlier this week.  The left facial weakness and gait abnormality have not resolved yet.  He has a virtual follow-up with Dr. Mickeal Skinner on January 25th.  Acute pulmonary  embolism without acute cor pulmonale (HCC) Non occlusive segmental pulmonary embolism in the inferior left upper lobe diagnosed in September.  He remains on rivaroxaban 20 mg daily.  He denies abnormal bruising or bleeding.  We could consider discontinuing rivaroxaban after 6 months of therapy.  Melanoma of skin ([place]) History of stage IIB malignant melanoma of the right posterior shoulder, Breslow's depth 3.3 mm, Clark level IV, treated with wide local excision, diagnosed in May 2021.  There was a question as to whether this represented a metastatic lesion, rather than a primary, based on the presence of 2 nodules and no epidermal involvement.  MRI head and CT chest, abdomen and pelvis in June 2021 were negative for metastatic disease.   No adjuvant therapy was recommended.  PET scan in September was negative for metastatic disease.  He was found to have recurrence in mid back in June 2023.  Biopsy revealed metastatic malignant melanoma.    The patient understands the plans discussed today and is in agreement with them.  He knows to contact our office if he develops concerns prior to his next appointment.   I provided 20 minutes of face-to-face time during this encounter and > 50% was spent counseling as documented under my assessment and plan.    [name], PA-C  [facility] AT [facility] [address] [place] [address] Dept: [phone] Dept Fax: [phone]   No orders of the defined types were  placed in this encounter.     CHIEF COMPLAINT:  CC: Metastatic melanoma  Current Treatment: Maintenance nivolumab every 4 weeks  HISTORY OF PRESENT ILLNESS:   Oncology History  Melanoma of skin ([place])  12/01/2019 Initial Diagnosis   Melanoma of skin ([place])   12/05/2019 Cancer Staging   Staging form: Melanoma of the Skin, AJCC 8th Edition - Clinical stage from 12/05/2019: Stage IIA (cT3a, cN0, cM0) - Signed by Derwood Kaplan,  MD on 05/30/2020   02/18/2021 Genetic Testing   No pathogenic variants detected in [facility] Multi-Cancer +RNA Panel.  Variants of uncertain significance detected in APC (c.681C>G (p.Asp227Glu)), CDKN1C (c.610C>G (p.Pro204Ala)), and FH (c.1151C>T (p.Ala384Val)).  The report date is February 18, 2021.    The Multi-Cancer + RNA Panel offered by [facility] includes sequencing and/or deletion/duplication analysis of the following 84 genes:  AIP*, ALK, APC*, ATM*, AXIN2*, BAP1*, BARD1*, BLM*, BMPR1A*, BRCA1*, BRCA2*, BRIP1*, CASR, CDC73*, CDH1*, CDK4, CDKN1B*, CDKN1C*, CDKN2A, CEBPA, CHEK2*, CTNNA1*, DICER1*, DIS3L2*, EGFR, EPCAM, FH*, FLCN*, GATA2*, GPC3, GREM1, HOXB13, HRAS, KIT, MAX*, MEN1*, MET, MITF, MLH1*, MSH2*, MSH3*, MSH6*, MUTYH*, NBN*, NF1*, NF2*, NTHL1*, PALB2*, PDGFRA, PHOX2B, PMS2*, POLD1*, POLE*, POT1*, PRKAR1A*, PTCH1*, PTEN*, RAD50*, RAD51C*, RAD51D*, RB1*, RECQL4, RET, RUNX1*, SDHA*, SDHAF2*, SDHB*, SDHC*, SDHD*, SMAD4*, SMARCA4*, SMARCB1*, SMARCE1*, STK11*, SUFU*, TERC, TERT, TMEM127*, Tp53*, TSC1*, TSC2*, VHL*, WRN*, and WT1.  RNA analysis is performed for * genes.   01/30/2022 - 02/20/2022 Chemotherapy   Patient is on Treatment Plan : MELANOMA Nivolumab + Ipilimumab (1/3) q21d / Nivolumab q28d     01/30/2022 -  Chemotherapy   Patient is on Treatment Plan : MELANOMA Nivolumab (480) q28d     Malignant melanoma metastatic to brain ([place])  12/19/2021 Initial Diagnosis   Malignant melanoma metastatic to brain ([place])   01/30/2022 - 02/20/2022 Chemotherapy   Patient is on Treatment Plan : MELANOMA Nivolumab + Ipilimumab (1/3) q21d / Nivolumab q28d     01/30/2022 -  Chemotherapy   Patient is on Treatment Plan : MELANOMA Nivolumab (480) q28d     Malignant melanoma metastatic to lung ([place])  12/19/2021 Initial Diagnosis   Malignant melanoma metastatic to lung ([place])   01/30/2022 - 02/20/2022 Chemotherapy   Patient is on Treatment Plan : MELANOMA Nivolumab + Ipilimumab (1/3) q21d / Nivolumab q28d      01/30/2022 -  Chemotherapy   Patient is on Treatment Plan : MELANOMA Nivolumab (480) q28d     03/24/2022 Imaging   CTA chest:  IMPRESSION:  1. Small nonocclusive segmental level pulmonary embolism in the  inferior LEFT upper lobe.  2. Marked bronchial wall thickening with areas of patchy basilar  opacities and new ground-glass opacities in the upper lobes.  Constellation of findings may be infectious bronchiolitis/bronchitis  though the possibility of drug related changes in the setting of  immunotherapy are also considered. Furthermore, given basilar  predominance and some material in basilar bronchial structures would  correlate with any risk factors for or history of aspiration.  3. Response to therapy of bilateral metastatic lesions. Some lymph  nodes in the chest with interval increase in size are equivocal at  this time, potentially reactive, but warrant attention on subsequent  imaging.    07/22/2022 Imaging   CT chest, abdomen and pelvis:  IMPRESSION:  1. Interval positive response to therapy. Resolved subcarinal and  bilateral hilar lymphadenopathy. Stable to decreased left pulmonary  nodules. No new or progressive metastatic disease in the chest,  abdomen or pelvis.  2. One vessel coronary atherosclerosis.  3. Aortic Atherosclerosis (ICD10-I70.0).    Malignant neoplasm metastatic to liver ([place])  12/19/2021 Initial Diagnosis   Malignant neoplasm metastatic to liver ([place])   01/30/2022 - 02/20/2022 Chemotherapy   Patient is on Treatment Plan : MELANOMA Nivolumab + Ipilimumab (1/3) q21d / Nivolumab q28d     01/30/2022 -  Chemotherapy   Patient is on Treatment Plan : MELANOMA Nivolumab (480) q28d     07/22/2022 Imaging   CT chest, abdomen and pelvis:  IMPRESSION:  1. Interval positive response to therapy. Resolved subcarinal and  bilateral hilar lymphadenopathy. Stable to decreased left pulmonary  nodules. No new or progressive metastatic disease in the chest,  abdomen  or pelvis.  2. One vessel coronary atherosclerosis.  3. Aortic Atherosclerosis (ICD10-I70.0).        INTERVAL HISTORY:  Dannye is here today for repeat clinical assessment prior to 7th cycle of nivolumab.  The patient states Dr. Mickeal Skinner discontinued the dexamethasone 2 mg daily earlier this month as he really did not notice improvement and he is neurologic symptoms.  He states he had worsening left facial weakness, headache, dizziness and stumbling to the left so contacted Dr. Renda Rolls office earlier this week.  He was placed on dexamethasone 4 mg daily.  He still has persistent left facial weakness, headache and gait abnormality.  He feels his balance has improved.  He denies persistent dizziness.  He reports occasional nausea and diarrhea, but not severe.  He report increase in his rash with stable itching.  He is only using hydroxyzine for the itching, nothing topical for the rash. He denies fevers or chills. He denies pain. His appetite is good. His weight has increased 1 pounds over last 4 weeks .  Prior to his visit today, he underwent repeat CT chest, abdomen and pelvis which fortunately revealed a continued positive response to therapy.  This did reveal one-vessel coronary atherosclerosis and aortic atherosclerosis.  He is not on any lipid lowering medication.  He does not follow with his PCP regularly.  I recommended he see his PCP soon to discuss this, as well as his hypertension.  He asks about stopping rivaroxaban, as no pulmonary embolism is noted on this weeks scans.  REVIEW OF SYSTEMS:  Review of Systems  Constitutional:  Positive for fatigue. Negative for appetite change, chills, fever and unexpected weight change.  HENT:   Negative for lump/mass, mouth sores and sore throat.   Respiratory:  Negative for cough and shortness of breath.   Cardiovascular:  Negative for chest pain and leg swelling.  Gastrointestinal:  Negative for abdominal pain, constipation, diarrhea, nausea and vomiting.   Genitourinary:  Negative for difficulty urinating, dysuria, frequency and hematuria.   Musculoskeletal:  Positive for gait problem. Negative for arthralgias, back pain and myalgias.  Skin:  Negative for itching, rash and wound.  Neurological:  Positive for gait problem and headaches. Negative for dizziness, extremity weakness, light-headedness and numbness.  Hematological:  Negative for adenopathy.  Psychiatric/Behavioral:  Negative for depression and sleep disturbance. The patient is not nervous/anxious.      VITALS:  Blood pressure (!) 150/99, pulse 98, temperature 98.9 F (37.2 C), temperature source Oral, resp. rate 18, height 5' 9"$  (1.753 m), weight 167 lb 3.2 oz (75.8 kg), SpO2 100 %.  Wt Readings from Last 3 Encounters:  07/24/22 167 lb 3.2 oz (75.8 kg)  07/01/22 166 lb (75.3 kg)  06/26/22 163 lb (73.9 kg)    Body mass index is 24.69 kg/m.  Performance status (ECOG): 1 -  Symptomatic but completely ambulatory  PHYSICAL EXAM:  Physical Exam Vitals and nursing note reviewed.  Constitutional:      General: He is not in acute distress.    Appearance: Normal appearance. He is normal weight.  HENT:     Head: Normocephalic and atraumatic.     Mouth/Throat:     Mouth: Mucous membranes are moist.     Pharynx: Oropharynx is clear. No oropharyngeal exudate or posterior oropharyngeal erythema.  Eyes:     General: No scleral icterus.    Extraocular Movements: Extraocular movements intact.     Conjunctiva/sclera: Conjunctivae normal.     Pupils: Pupils are equal, round, and reactive to light.  Cardiovascular:     Rate and Rhythm: Normal rate and regular rhythm. Occasional Extrasystoles are present.    Heart sounds: No murmur heard.    No friction rub. No gallop.  Pulmonary:     Effort: Pulmonary effort is normal.     Breath sounds: Normal breath sounds. No wheezing, rhonchi or rales.  Abdominal:     General: Bowel sounds are normal. There is no distension.     Palpations:  Abdomen is soft. There is no hepatomegaly, splenomegaly or mass.     Tenderness: There is no abdominal tenderness.  Musculoskeletal:        General: Normal range of motion.     Cervical back: Normal range of motion and neck supple. No tenderness.     Right lower leg: No edema.     Left lower leg: No edema.  Lymphadenopathy:     Cervical: No cervical adenopathy.     Upper Body:     Right upper body: No supraclavicular or axillary adenopathy.     Left upper body: No supraclavicular or axillary adenopathy.     Lower Body: No right inguinal adenopathy. No left inguinal adenopathy.  Skin:    General: Skin is warm and dry.     Coloration: Skin is not jaundiced.     Findings: No rash.  Neurological:     Mental Status: He is alert and oriented to person, place, and time.     Cranial Nerves: Facial asymmetry present.  Psychiatric:        Mood and Affect: Mood normal.        Behavior: Behavior normal.        Thought Content: Thought content normal.     LABS:      Latest Ref Rng & Units 07/24/2022    3:13 PM 06/26/2022   11:13 AM 06/26/2022   12:00 AM  CBC  WBC 4.0 - 10.5 K/uL 6.8  7.7    Hemoglobin 13.0 - 17.0 g/dL 14.0  13.4  --  C     Hematocrit 39.0 - 52.0 % 43.1  41.9  --  C     Platelets 150 - 400 K/uL 419  394  --  C       C Corrected result   This result is from an external source.      Latest Ref Rng & Units 07/24/2022    3:13 PM 06/26/2022   11:13 AM 06/12/2022   11:18 AM  CMP  Glucose 70 - 99 mg/dL 99  80  102   BUN 6 - 20 mg/dL 18  14  20   $ Creatinine 0.61 - 1.24 mg/dL 1.04  0.80  0.90   Sodium 135 - 145 mmol/L 140  142  140   Potassium 3.5 - 5.1 mmol/L 4.1  4.0  4.0   Chloride 98 - 111 mmol/L 103  104  105   CO2 22 - 32 mmol/L 28  28  27   $ Calcium 8.9 - 10.3 mg/dL 9.6  9.6  9.0   Total Protein 6.5 - 8.1 g/dL 7.8  7.3  6.4   Total Bilirubin 0.3 - 1.2 mg/dL 0.3  0.5  0.5   Alkaline Phos 38 - 126 U/L 59  57  49   AST 15 - 41 U/L 16  16  14   $ ALT 0 - 44 U/L 13   14  14      $ No results found for: "CEA1", "CEA" / No results found for: "CEA1", "CEA" No results found for: "PSA1" No results found for: "[id number]" No results found for: "CAN125"  No results found for: "TOTALPROTELP", "ALBUMINELP", "A1GS", "A2GS", "BETS", "BETA2SER", "GAMS", "MSPIKE", "SPEI" Lab Results  Component Value Date   TIBC 297 05/21/2022   FERRITIN 41 05/21/2022   IRONPCTSAT 18 05/21/2022   Lab Results  Component Value Date   LDH 139 05/21/2022   LDH 145 12/19/2021    STUDIES:  Patient Name: Xavier Jones, Xavier Jones Account # [id number]   Exam(s): [id number] CT/CT CHEST-ABD-PELV W/IV CM  CLINICAL DATA: Metastatic right upper extremity melanoma.  Restaging. * Tracking Code: BO *   EXAM:  CT CHEST, ABDOMEN, AND PELVIS WITH CONTRAST   TECHNIQUE:  Multidetector CT imaging of the chest, abdomen and pelvis was  performed following the standard protocol during bolus  administration of intravenous contrast.   RADIATION DOSE REDUCTION: This exam was performed according to the  departmental dose-optimization program which includes automated  exposure control, adjustment of the mA and/or kV according to  patient size and/or use of iterative reconstruction technique.   CONTRAST: 100 cc Isovue 370 IV.   COMPARISON: 03/24/2022 chest CT angiogram. 12/28/2019 CT chest,  abdomen and pelvis.   FINDINGS:  CT CHEST FINDINGS  Cardiovascular: Normal heart size. No significant pericardial  effusion/thickening. Left anterior descending coronary  atherosclerosis. Right subclavian Port-A-Cath terminates in the  lower third of the SVC. Great vessels are normal in course and  caliber. No central pulmonary emboli.  Mediastinum/Nodes: No significant thyroid nodules. Unremarkable  esophagus. No pathologically enlarged axillary nodes. Previously  described mildly enlarged subcarinal and bilateral hilar lymph nodes  have all decreased and are not pathologically enlarged on today's  scan. No  new or residual pathologically enlarged mediastinal or  hilar nodes.  Lungs/Pleura: No pneumothorax. No pleural effusion. No acute  consolidative airspace disease or lung masses. Left upper lobe solid  0.4 cm pulmonary nodule (series 301/image 82), stable. Peripheral  left lower lobe 0.3 cm solid pulmonary nodule (series 301/image  Courtesy Copy to: Diagnostic Imaging Report   102), decreased from 0.5 cm. Separate 0.4 cm indistinct peripheral  left lower lobe nodule (series 301/image 101) is decreased in size  from 0.5 cm and decreased in density. No new significant pulmonary  nodules.  Musculoskeletal: No aggressive appearing focal osseous lesions. Mild  thoracic spondylosis.   CT ABDOMEN PELVIS FINDINGS  Hepatobiliary: Normal liver with no liver mass. Normal gallbladder  with no radiopaque cholelithiasis. No biliary ductal dilatation.  Pancreas: Normal, with no mass or duct dilation.  Spleen: Normal size. No mass.  Adrenals/Urinary Tract: Normal adrenals. Normal kidneys with no  hydronephrosis and no renal mass. Normal bladder.  Stomach/Bowel: Normal non-distended stomach. Normal caliber small  bowel with no small bowel wall thickening. Normal appendix. Oral  contrast transits to the rectum. Normal  large bowel with no  diverticulosis, large bowel wall thickening or pericolonic fat  stranding.  Vascular/Lymphatic: Atherosclerotic nonaneurysmal abdominal aorta.  Patent portal, splenic, hepatic and renal veins. No pathologically  enlarged lymph nodes in the abdomen or pelvis.  Reproductive: Normal size prostate.  Other: No pneumoperitoneum, ascites or focal fluid collection.  Musculoskeletal: Stable L1 corpectomy and left lateral spinal fusion  T12-L2. No focal osseous lesions.   IMPRESSION:  1. Interval positive response to therapy. Resolved subcarinal and  bilateral hilar lymphadenopathy. Stable to decreased left pulmonary  nodules. No new or progressive metastatic disease in  the chest,  abdomen or pelvis.  2. One vessel coronary atherosclerosis.  3. Aortic Atherosclerosis (ICD10-I70.0).    HISTORY:   Past Medical History:  Diagnosis Date   Acute pulmonary embolism without acute cor pulmonale ([place]) 05/01/2022   Anemia 05/20/2022   Depression    mild   Family history of breast cancer 01/22/2021   Family history of prostate cancer 01/22/2021   Genital herpes    History of back injury 2003   Pruritic rash 02/18/2022   Skin cancer    melanoma    Past Surgical History:  Procedure Laterality Date   APPLICATION OF CRANIAL NAVIGATION N/A 01/03/2022   Procedure: APPLICATION OF CRANIAL NAVIGATION;  Surgeon: Consuella Lose, MD;  Location: [facility];  Service: Neurosurgery;  Laterality: N/A;   CRANIOTOMY Left 01/03/2022   Procedure: Stereotactic Left temporal craniotomy for resection of tumor;  Surgeon: Consuella Lose, MD;  Location: [facility];  Service: Neurosurgery;  Laterality: Left;   MELANOMA EXCISION WITH SENTINEL LYMPH NODE BIOPSY Right 2021   SPINAL FUSION W/ LUQUE UNIT ROD  2003    Family History  Problem Relation Age of Onset   Prostate cancer Father        dx 66s   Breast cancer Maternal Aunt        dx after 32; bilateral mastectomy   Stomach cancer Maternal Grandfather        dx 73s   Breast cancer Cousin 96       maternal male cousin; mets    Social History:  reports that he quit smoking about 26 years ago. His smoking use included cigarettes. He has never used smokeless tobacco. He reports current alcohol use of about 14.0 standard drinks of alcohol per week. He reports that he does not currently use drugs.The patient is alone today.  Allergies:  Allergies  Allergen Reactions   Testosterone Itching    Gel    Sulfa Antibiotics Rash    Current Medications: Current Outpatient Medications  Medication Sig Dispense Refill   dexamethasone (DECADRON) 4 MG tablet Take 4 mg by mouth daily.     triamcinolone (KENALOG) 0.025 % cream Apply  1 Application topically 2 (two) times daily. Apply to affected areas twice daily as needed 80 g 2   acyclovir (ZOVIRAX) 400 MG tablet Take 400 mg by mouth 3 (three) times daily. Taking once daily per patient     amitriptyline (ELAVIL) 25 MG tablet Take 50 mg by mouth at bedtime.     ergocalciferol (VITAMIN D2) 1.25 MG (50000 UT) capsule Take 50,000 Units by mouth every Wednesday.     hydrOXYzine (ATARAX) 25 MG tablet Take 1 tablet (25 mg total) by mouth 3 (three) times daily as needed for itching. 60 tablet 3   lidocaine-prilocaine (EMLA) cream Apply 1 Application topically as needed. 30 g 0   rivaroxaban (XARELTO) 20 MG TABS tablet Take 1 tablet (20 mg  total) by mouth daily with supper. Start after completing 3 weeks of Xarelto 15 mg twice daily 30 tablet 3   No current facility-administered medications for this visit.

## 2022-07-24 NOTE — Assessment & Plan Note (Addendum)
Recurrent metastatic melanoma diagnosed in June 2023.  Bilateral hypermetabolic pulmonary nodules were seen on PET, the largest measuring 2.3 cm, which had increased in size compared with the CT chest performed in May.  BRAF testing was negative. He was treated with 4 cycles of ipilimumab/nivolumab.   CTA in September done due to shortness of breath, revealed a small nonocclusive segmental pulmonary embolism in the left upper lobe, as well as changes consistent with bronchitis.  There was a decrease in the bilateral pulmonary nodules consistent with a response to therapy. There was interval increase in hilar and mediastinal lymph nodes, but this is commonly seen with immunotherapy.  He was placed on anticoagulation and treated for bronchitis.  Prior to starting maintenance nivolumab he developed acute onset diarrhea felt to be immune mediated, so immunotherapy was placed on hold and he was started on high dose prednisone.  He was not initially compliant with prednisone 20 mg 3 times daily, so was recommended for daily dosing.  His dose was later decreased to 40 mg daily, but then he was only taking 20 mg daily due to lower extremity edema.  He was to wear off prednisone, but then had general pruritus, so was placed back on prednisone 10 mg daily.  Maintenance nivolumab was started in November.  He did not have recurrent diarrhea.  He has had persistent pruritus, which is not severe.  Dr. Mickeal Skinner recommended switching him to dexamethasone 2 mg daily in December due to persistent neurologic signs and symptoms.   CT chest, abdomen and pelvis on January 16th reveals a response to therapy with resolution of the subcarinal and hilar lymphadenopathy and stable to decreased left pulmonary nodules, without evidence of new or progressive metastatic disease.  He will proceed with a 8th cycle of nivolumab next week.  We will plan to see him back in 2 weeks for repeat clinical assessment prior to a 9th cycle of nivolumab.

## 2022-07-24 NOTE — Assessment & Plan Note (Addendum)
Stage IIB malignant melanoma of the right posterior shoulder with recurrence in the mid back in June of this year.  Biopsy revealed metastatic malignant melanoma.  Melan-A was positive S100 was positive SOX10 was positive and PRAME positive.  Staging PET in June revealed a focal hypermetabolic lesion of the left frontoparietal lobe, concerning for brain metastasis.  MRI brain confirmed the left frontoparietal lesion and revealed several other smaller lesions.  The solitary brain metastasis was treated with surgical resection, followed by stereotactic radiosurgery.  He was then placed on palliative ipilimumab/nivolumab.  He developed left facial weakness after his third cycle.  MRI of the brain was obtained.  This revealed left temporal resection cavity demonstrates thin linear enhancement along the resection margins, which may be postoperative, with additional small areas of somewhat more nodular enhancement, which are nonspecific but could indicate recurrent disease. Interval increase in the size of the right inferior frontal gyrus enhancing lesion with increased surrounding edema, mild local mass effect, and mild local midline shift.  Stable to slightly decreased size of the left pontine lesion with decreased associated edema.  Previously noted left inferior frontal gyrus lesion is no longer visualized. No definite new lesion.  He was placed on dexamethasone 4 mg twice daily, then decreased to 4 mg daily once his neurologic symptoms resolved. After discontinuation of the dexamethasone, he resumed immunotherapy receiving a 4th and final cycle of ipilimumab/nivolumab on October 9th.  He was due to start maintenance nivolumab, but has developed severe diarrhea felt to be immune mediated, so immunotherapy was held and he was treated with high-dose prednisone.  He has not been extremely compliant with this.  He is currently on prednisone 10 mg daily.  Nivolumab maintenance was started on November 27.  The patient  developed recurrent mild left-sided facial weakness despite prednisone 10 mg daily, which he is being given for generalized pruritus associated with immunotherapy.    MRI brain on December 13th revealed persistent intrinsically T1 hyperintense blood products at the left temporal resection site. Linear enhancement along the lateral resection margin extending to the dura may reflect evolving postoperative change, though residual/recurrent disease is not entirely excluded. Recommend continued attention on follow-up.  There was slight interval increase in size of the lesion in the left middle cerebellar peduncle with similar surrounding edema.  There was no significant change in size of the lesion in the right anteromedial frontal lobe but with slightly decreased surrounding edema. No new lesions were seen.  He saw Dr. Mickeal Skinner on December 18th and reported increased urgency of urination with occasional incontinence, in addition to his left facial weakness.  Both were felt to be due to the inflammatory changes in the brain, so Dr. Mickeal Skinner recommended discontinuing prednisone 10 mg daily and placing patient on dexamethasone 2 mg daily.  Dr. Mickeal Skinner discontinued dexamethasone 2 mg daily, as he had persistent mild left facial weakness and urinary symptoms.  The patient states he has had worsening left facial weakness, as well as balance issues with stumbling to the left.  So he contacted Dr. Renda Rolls office and was on dexamethasone 4 mg daily earlier this week.  The left facial weakness and gait abnormality have not resolved yet.  He has a virtual follow-up with Dr. Mickeal Skinner on January 25th.

## 2022-07-25 ENCOUNTER — Encounter: Payer: Self-pay | Admitting: Hematology and Oncology

## 2022-07-25 LAB — T4: T4, Total: 7 ug/dL (ref 4.5–12.0)

## 2022-07-25 NOTE — Assessment & Plan Note (Signed)
History of stage IIB malignant melanoma of the right posterior shoulder, Breslow's depth 3.3 mm, Clark level IV, treated with wide local excision, diagnosed in May 2021.  There was a question as to whether this represented a metastatic lesion, rather than a primary, based on the presence of 2 nodules and no epidermal involvement.  MRI head and CT chest, abdomen and pelvis in June 2021 were negative for metastatic disease.   No adjuvant therapy was recommended.  PET scan in September was negative for metastatic disease.  He was found to have recurrence in mid back in June 2023.  Biopsy revealed metastatic malignant melanoma.

## 2022-07-26 ENCOUNTER — Encounter: Payer: Self-pay | Admitting: Oncology

## 2022-07-28 ENCOUNTER — Other Ambulatory Visit: Payer: Self-pay | Admitting: Radiation Therapy

## 2022-07-28 ENCOUNTER — Other Ambulatory Visit: Payer: Self-pay | Admitting: Hematology and Oncology

## 2022-07-28 ENCOUNTER — Inpatient Hospital Stay: Payer: 59

## 2022-07-28 ENCOUNTER — Inpatient Hospital Stay: Payer: Medicare Other

## 2022-07-28 ENCOUNTER — Telehealth: Payer: Self-pay

## 2022-07-28 VITALS — BP 179/86 | HR 103 | Temp 98.3°F | Resp 20 | Wt 172.1 lb

## 2022-07-28 VITALS — BP 141/90 | HR 83 | Temp 98.3°F | Resp 18 | Ht 69.0 in | Wt 172.1 lb

## 2022-07-28 DIAGNOSIS — C7931 Secondary malignant neoplasm of brain: Secondary | ICD-10-CM | POA: Diagnosis not present

## 2022-07-28 DIAGNOSIS — C7801 Secondary malignant neoplasm of right lung: Secondary | ICD-10-CM | POA: Diagnosis not present

## 2022-07-28 DIAGNOSIS — C787 Secondary malignant neoplasm of liver and intrahepatic bile duct: Secondary | ICD-10-CM

## 2022-07-28 DIAGNOSIS — Z95828 Presence of other vascular implants and grafts: Secondary | ICD-10-CM | POA: Insufficient documentation

## 2022-07-28 DIAGNOSIS — I493 Ventricular premature depolarization: Secondary | ICD-10-CM | POA: Diagnosis not present

## 2022-07-28 DIAGNOSIS — R002 Palpitations: Secondary | ICD-10-CM | POA: Diagnosis not present

## 2022-07-28 DIAGNOSIS — C779 Secondary and unspecified malignant neoplasm of lymph node, unspecified: Secondary | ICD-10-CM | POA: Diagnosis not present

## 2022-07-28 DIAGNOSIS — C4361 Malignant melanoma of right upper limb, including shoulder: Secondary | ICD-10-CM | POA: Diagnosis not present

## 2022-07-28 DIAGNOSIS — I491 Atrial premature depolarization: Secondary | ICD-10-CM | POA: Diagnosis not present

## 2022-07-28 DIAGNOSIS — C439 Malignant melanoma of skin, unspecified: Secondary | ICD-10-CM | POA: Diagnosis not present

## 2022-07-28 DIAGNOSIS — Z452 Encounter for adjustment and management of vascular access device: Secondary | ICD-10-CM | POA: Diagnosis not present

## 2022-07-28 DIAGNOSIS — C7800 Secondary malignant neoplasm of unspecified lung: Secondary | ICD-10-CM

## 2022-07-28 DIAGNOSIS — C7802 Secondary malignant neoplasm of left lung: Secondary | ICD-10-CM | POA: Diagnosis not present

## 2022-07-28 HISTORY — DX: Presence of other vascular implants and grafts: Z95.828

## 2022-07-28 LAB — CBC: RBC: 4.04 (ref 3.87–5.11)

## 2022-07-28 LAB — CBC AND DIFFERENTIAL
HCT: 38 — AB (ref 41–53)
Hemoglobin: 13.1 — AB (ref 13.5–17.5)
MCV: 95 — AB (ref 80–94)
Neutrophils Absolute: 3.85
Platelets: 390 10*3/uL (ref 150–400)
WBC: 7

## 2022-07-28 LAB — BASIC METABOLIC PANEL
BUN: 13 (ref 4–21)
CO2: 30 — AB (ref 13–22)
Chloride: 105 (ref 99–108)
Creatinine: 0.7 (ref 0.6–1.3)
Glucose: 93
Potassium: 3.7 mEq/L (ref 3.5–5.1)
Sodium: 138 (ref 137–147)

## 2022-07-28 LAB — HEPATIC FUNCTION PANEL
ALT: 12 U/L (ref 10–40)
AST: 29 (ref 14–40)
Alkaline Phosphatase: 56 (ref 25–125)
Bilirubin, Total: 0.5

## 2022-07-28 LAB — COMPREHENSIVE METABOLIC PANEL
Albumin: 3.8 (ref 3.5–5.0)
Calcium: 9 (ref 8.7–10.7)

## 2022-07-28 MED ORDER — HEPARIN SOD (PORK) LOCK FLUSH 100 UNIT/ML IV SOLN
500.0000 [IU] | Freq: Once | INTRAVENOUS | Status: AC | PRN
  Administered 2022-07-28: 500 [IU]

## 2022-07-28 MED ORDER — SODIUM CHLORIDE 0.9 % IV SOLN: Freq: Once | INTRAVENOUS | Status: AC

## 2022-07-28 MED ORDER — SODIUM CHLORIDE 0.9% FLUSH
10.0000 mL | INTRAVENOUS | Status: DC | PRN
  Administered 2022-07-28: 10 mL

## 2022-07-28 MED ORDER — SODIUM CHLORIDE 0.9 % IV SOLN
480.0000 mg | Freq: Once | INTRAVENOUS | Status: DC
  Filled 2022-07-28: qty 48

## 2022-07-28 MED ORDER — SODIUM CHLORIDE 0.9% FLUSH: 10.0000 mL | INTRAVENOUS | Status: DC | PRN

## 2022-07-28 NOTE — Telephone Encounter (Signed)
-----  Message from [name], PA-C sent at 07/28/2022  1:49 PM EST ----- Please schedule with cardiology ASAP. Patient due for treatment this week. Presented to infusion today with worsening palpitations and dizziness today. He just had occasional extra beats when I saw him last week.  He was evaluated in the emergency room at [facility] today, but  discharged with findings of  frequent PVCs.  Dr. Hinton Rao would like him to see the cardiologist for more thorough evaluation.  Thank you

## 2022-07-28 NOTE — Patient Instructions (Signed)
[employer], Adult A central line is a long, thin tube (catheter) that is put into a vein so that it goes to a large vein above your heart. It can be used to: Give you medicine or fluids. Give you food and nutrients. Take blood or give you blood for testing or treatments. Types of central lines There are four main types of central lines: Peripherally inserted central catheter (PICC) line. This type is usually put in the upper arm and goes up the arm to the heart. Tunneled central line. This type is placed in a large vein in the neck, chest, or groin. It is tunneled under the skin and brought out through a second incision. Non-tunneled central line. This type is used for a shorter time than other types, usually for 7 days at the most. It is inserted in the neck, chest, or groin. Implanted port. This type can stay in place longer than other types of central lines. It is normally put in the upper chest but can also be placed in the upper arm or the belly. Surgery is needed to put it in and take it out. The type of central line you get will depend on how long you need it and your medical condition. Tell a doctor about: Any allergies you have. All medicines you are taking. These include vitamins, herbs, eye drops, creams, and over-the-counter medicines. Any problems you or family members have had with anesthetic medicines. Any blood disorders you have. Any surgeries you have had. Any medical conditions you have. Whether you are pregnant or may be pregnant. What are the risks? Generally, central lines are safe. However, problems may occur, including: Infection. A blood clot. Bleeding from the place where the central line was inserted. Getting a hole or crack in the central line. If this happens, the central line will need to be replaced. Central line failure. The catheter moving or coming out of place. What happens before the procedure? Medicines Ask your doctor about changing or  stopping: Your normal medicines. Vitamins, herbs, and supplements. Over-the-counter medicines. Do not take aspirin or ibuprofen unless you are told to. General instructions Follow instructions from your doctor about eating or drinking. For your safety, your doctor may: [place] the area of the procedure. Remove hair at the procedure site. Ask you to wash with a soap that kills germs. Plan to have a responsible adult take you home from the hospital or clinic. If you will be going home right after the procedure, plan to have a responsible adult care for you for the time you are told. This is important. What happens during the procedure? An IV tube will be put into one of your veins. You may be given: A sedative. This medicine helps you relax. Anesthetics. These medicines numb certain areas of your body. Your skin will be cleaned with a germ-killing (antiseptic) solution. You may be covered with clean drapes. Your blood pressure, heart rate, breathing rate, and blood oxygen level will be monitored during the procedure. The central line will be put into the vein and moved through it to the correct spot. The doctor may use X-ray equipment to help guide the central line to the right place. A bandage (dressing) will be placed over the insertion area. The procedure may vary among doctors and hospitals. What can I expect after the procedure? You will be monitored until you leave the hospital or clinic. This includes checking your blood pressure, heart rate, breathing rate, and blood oxygen level. Caps may  be placed on the ends of the central line tubing. If you were given a sedative during your procedure, do not drive or use machines until your doctor says that it is safe. Follow these instructions at home: Caring for the tube  Follow instructions from your doctor about: Flushing the tube. Cleaning the tube and the area around it. Only use germ-free (sterile) supplies to flush. The supplies  should be from your doctor, a pharmacy, or another place that your doctor recommends. Before you flush the tube or clean the area around the tube: Wash your hands with soap and water for at least 20 seconds. If you cannot use soap and water, use hand sanitizer. Clean the central line hub with rubbing alcohol. To do this: Scrub it using a twisting motion and rub for 10 to 15 seconds or for 30 twists. Follow the manufacturer's instructions. Be sure you scrub the top of the hub, not just the sides. Never reuse alcohol pads. Let the hub dry before use. Keep it from touching anything while drying. Caring for your skin Check the skin around the central line every day for signs of infection. Check for: Redness, swelling, or pain. Fluid or blood. Warmth. Pus or a bad smell. Keep the area where the tube was put in clean and dry. Change bandages only as told by your doctor. Keep your bandage dry. If a bandage gets wet, have it changed right away. General instructions Keep the tube clamped, unless it is being used. If you or someone else accidentally pulls on the tube, make sure: The bandage is okay. There is no bleeding. The tube has not been pulled out. Do not use scissors or sharp objects near the tube. Do not take baths, swim, or use a hot tub until your doctor says it is okay. Ask your doctor if you may take showers. You may only be allowed to take sponge baths. Ask your doctor what activities are safe for you. Your doctor may tell you not to lift anything or move your arm too much. Take over-the-counter and prescription medicines only as told by your doctor. Keep all follow-up visits. Storing and throwing away supplies Keep your supplies in a clean, dry location. Throw away any used syringes in a container that is only for sharp items (sharps container). You can buy a sharps container from a pharmacy, or you can make one by using an empty hard plastic bottle with a cover. Place any used  bandages or infusion bags into a plastic bag. Throw that bag in the trash. Contact a doctor if: You have any of these signs of infection where the tube was put in: Redness, swelling, or pain. Fluid or blood. Warmth. Pus or a bad smell. Get help right away if: You have: A fever or chills. Shortness of breath. Pain in your chest. A fast heartbeat. Swelling in your neck, face, chest, or arm. You feel dizzy or you faint. There are red lines coming from where the tube was put in. The area where the tube was put in is bleeding and the bleeding will not stop. Your tube is hard to flush. You do not get a blood return from the tube. The tube gets loose or comes out. The tube has a hole or a tear. The tube leaks. Summary A central line is a long, thin tube (catheter) that is put in your vein. It can be used to give you medicine, food, or fluids. Follow instructions from your doctor about flushing  and cleaning the tube. Keep the area where the tube was put in clean and dry. Ask your doctor what activities are safe for you. This information is not intended to replace advice given to you by your health care provider. Make sure you discuss any questions you have with your health care provider. Document Revised: 02/23/2020 Document Reviewed: 02/23/2020 Elsevier Patient Education  [place].

## 2022-07-28 NOTE — Telephone Encounter (Signed)
Patient aware internal referral sent to Dr Ansel Bong here in [place], patient will call me if he does not here from them this week. This is follow-up from ED visit today.

## 2022-07-28 NOTE — Addendum Note (Signed)
Addended by: Daryel November on: 07/28/2022 02:32 PM   Modules accepted: Orders

## 2022-07-28 NOTE — Progress Notes (Signed)
0920-pt c/o frequent and increasing palpitations.  Reports hx of occasional palpitations and had to wear a halter monitor 30 yrs ago.  Pt reports feeling light headed and shob currently.  Radial pulse irregular.  Ulice Dash, [place] notified.   0924-Kelli Mosher, PA notified by Ulice Dash, [facility] 0955-report called by Erroll Luna, RN to charge nurse, Jinny Blossom, at [facility] ER.   [phone] here, report given.  Pt transferred by stretcher.

## 2022-07-29 ENCOUNTER — Inpatient Hospital Stay: Payer: Medicare Other

## 2022-07-29 ENCOUNTER — Other Ambulatory Visit: Payer: Self-pay

## 2022-07-31 ENCOUNTER — Encounter: Payer: Self-pay | Admitting: Oncology

## 2022-07-31 ENCOUNTER — Inpatient Hospital Stay (HOSPITAL_BASED_OUTPATIENT_CLINIC_OR_DEPARTMENT_OTHER): Payer: 59 | Admitting: Internal Medicine

## 2022-07-31 DIAGNOSIS — C7931 Secondary malignant neoplasm of brain: Secondary | ICD-10-CM

## 2022-07-31 MED ORDER — PENTOXIFYLLINE ER 400 MG PO TBCR: 400.0000 mg | EXTENDED_RELEASE_TABLET | Freq: Two times a day (BID) | ORAL | 3 refills | Status: DC

## 2022-07-31 MED ORDER — DEXAMETHASONE 4 MG PO TABS: 4.0000 mg | ORAL_TABLET | Freq: Two times a day (BID) | ORAL | 1 refills | Status: DC

## 2022-07-31 MED ORDER — VITAMIN E 180 MG (400 UNIT) PO CAPS: 400.0000 [IU] | ORAL_CAPSULE | Freq: Every day | ORAL | 2 refills | Status: DC

## 2022-07-31 NOTE — Progress Notes (Signed)
I connected with Xavier Jones on 07/31/22 at 10:00 AM EST by telephone visit and verified that I am speaking with the correct person using two identifiers.  I discussed the limitations, risks, security and privacy concerns of performing an evaluation and management service by telemedicine and the availability of in-person appointments. I also discussed with the patient that there may be a patient responsible charge related to this service. The patient expressed understanding and agreed to proceed.  Other persons participating in the visit and their role in the encounter:  n/a   Patient's location:  Home Provider's location:  Office Chief Complaint:  Malignant melanoma metastatic to brain [facility])  History of Present Ilness: Xavier Jones describes no improvement in recent symptoms despite restarting the decadron '4mg'$  daily.  He is now experiencing some drooping of left side of face and difficulty closing his left eye.  These is mild weakness on the left side.  Otherwise no new or progressive changes.  Still getting nivolumab with Dr. Hinton Rao.  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    Recommendations: -Increase decadron to '4mg'$  BID, may be underdosing him -Will challenge with Vit E and Trental for radiation necrosis, BID dosing -Touch base again in 14 days; if not improved we will move up MRI scan and discuss alternate plans  Otherwise RTC as scheduled in March following MRI.  I discussed the assessment and treatment plan with the patient.  The patient was provided an opportunity to ask questions and all were answered.  The patient agreed with the plan and demonstrated understanding of the instructions.    The patient was advised to call back or seek an in-person evaluation if the  symptoms worsen or if the condition fails to improve as anticipated.    [name], MD   I provided 25 minutes of non face-to-face telephone visit time during this encounter, and > 50% was spent counseling as documented under my assessment & plan.

## 2022-08-01 ENCOUNTER — Telehealth: Payer: Self-pay | Admitting: Internal Medicine

## 2022-08-01 NOTE — Telephone Encounter (Signed)
Called patient to schedule f/u. Patient scheduled and notified.

## 2022-08-04 DIAGNOSIS — D485 Neoplasm of uncertain behavior of skin: Secondary | ICD-10-CM | POA: Diagnosis not present

## 2022-08-04 DIAGNOSIS — D225 Melanocytic nevi of trunk: Secondary | ICD-10-CM | POA: Diagnosis not present

## 2022-08-04 DIAGNOSIS — Z85820 Personal history of malignant melanoma of skin: Secondary | ICD-10-CM | POA: Diagnosis not present

## 2022-08-05 DIAGNOSIS — Z6824 Body mass index (BMI) 24.0-24.9, adult: Secondary | ICD-10-CM | POA: Diagnosis not present

## 2022-08-05 DIAGNOSIS — E785 Hyperlipidemia, unspecified: Secondary | ICD-10-CM | POA: Diagnosis not present

## 2022-08-05 DIAGNOSIS — Z79899 Other long term (current) drug therapy: Secondary | ICD-10-CM | POA: Diagnosis not present

## 2022-08-05 DIAGNOSIS — Z125 Encounter for screening for malignant neoplasm of prostate: Secondary | ICD-10-CM | POA: Diagnosis not present

## 2022-08-05 DIAGNOSIS — E559 Vitamin D deficiency, unspecified: Secondary | ICD-10-CM | POA: Diagnosis not present

## 2022-08-11 DIAGNOSIS — A6000 Herpesviral infection of urogenital system, unspecified: Secondary | ICD-10-CM | POA: Insufficient documentation

## 2022-08-11 DIAGNOSIS — F32A Depression, unspecified: Secondary | ICD-10-CM | POA: Insufficient documentation

## 2022-08-11 NOTE — Progress Notes (Unsigned)
Cardiology Office Note:    Date:  08/12/2022   ID:  Xavier Jones, DOB [birth year], MRN [id number]  PCP:  Earlyne Iba, NP  Cardiologist:  Shirlee More, MD   Referring MD: [name], PA-C  ASSESSMENT:    1. Palpitations   2. 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 Chronic anticoagulation    PLAN:    In order of problems listed above:  Is having palpitation and on his smart watch and Apple phone rhythm sinus he has PVCs at times is APCs and brief runs of atrial premature contractions.  This may be influenced by his tricyclic antidepressant but at this point in time he is not willing to give it up.  Will place him on a small dose of the beta-blocker Sectral once daily to mitigate symptoms I tell him he can capture episodes but ever since atrial fibrillation is send through [place]. For completeness check echocardiogram I think would be highly unlikely to have cardiac involvement melanoma not seen on CT Continue his anticoagulant  Next appointment 6 months   Medication Adjustments/Labs and Tests Ordered: Current medicines are reviewed at length with the patient today.  Concerns regarding medicines are outlined above.  No orders of the defined types were placed in this encounter.  No orders of the defined types were placed in this encounter.    Chief Complaint  Patient presents with   Follow-up   Palpitations    History of Present Illness:    Xavier Jones is a 52 y.o. male with a history of stage IIb malignant melanoma of the right posterior shoulder with surgery in 2021 and recurrence in June 2023 with metastatic disease who is being seen today for the evaluation of palpitation at the request of [name], PA-C.  He is anticoagulated for pulmonary embolism.  He was seen in [facility] ED 07/28/2022 with a complaint of palpitation he is being cared for by oncology for melanoma and receiving chemotherapy his vital signs in the emergency  room showed a heart rate of 83 blood pressure 139/90 oxygen saturation 98% laboratory studies showed a normal CBC hemoglobin 10.1 platelets were 90,000 potassium 3.7 creatinine 0.7 troponin was checked that was normal.  EKG described as sinus rhythm and normal.  He was discharged from the emergency room.  Reviewing his chart he is taking amitriptyline which is potentially a proarrhythmic medication. CT of chest 07/30/2022 showed coronary and aortic atherosclerosis.  He takes a tricyclic antidepressant for sleep and tells me that he is dependent on it and he cannot sleep without it is potentially proarrhythmic He has a long history of palpitation he describes as forceful or pauses but has been worsened recently and occurs daily at times it makes him feel weak He has a smart watch and Apple phone I reviewed strips and he is having sinus rhythm with both PVCs as well as APCs and brief runs of atrial premature contractions he had symptoms while I was in the room consistent with APCs No chest pain edema orthopnea or syncope. He had a recent CT of the chest performed Past Medical History:  Diagnosis Date   Acute pulmonary embolism without acute cor pulmonale ([place]) 05/01/2022   Anemia 05/20/2022   Depression    mild   Family history of breast cancer 01/22/2021   Family history of prostate cancer 01/22/2021   Genital herpes    History of back injury 2003   Pruritic rash 02/18/2022   Skin  cancer    melanoma    Past Surgical History:  Procedure Laterality Date   APPLICATION OF CRANIAL NAVIGATION N/A 01/03/2022   Procedure: APPLICATION OF CRANIAL NAVIGATION;  Surgeon: Consuella Lose, MD;  Location: [place];  Service: Neurosurgery;  Laterality: N/A;   CRANIOTOMY Left 01/03/2022   Procedure: Stereotactic Left temporal craniotomy for resection of tumor;  Surgeon: Consuella Lose, MD;  Location: [place];  Service: Neurosurgery;  Laterality: Left;   MELANOMA EXCISION WITH SENTINEL LYMPH NODE BIOPSY  Right 2021   SPINAL FUSION W/ LUQUE UNIT ROD  2003    Current Medications: Current Meds  Medication Sig   acyclovir (ZOVIRAX) 400 MG tablet Take 400 mg by mouth 3 (three) times daily. Taking once daily per patient   amitriptyline (ELAVIL) 25 MG tablet Take 50 mg by mouth at bedtime.   dexamethasone (DECADRON) 4 MG tablet Take 1 tablet (4 mg total) by mouth 2 (two) times daily.   ergocalciferol (VITAMIN D2) 1.25 MG (50000 UT) capsule Take 50,000 Units by mouth every Wednesday.   hydrOXYzine (ATARAX) 25 MG tablet Take 1 tablet (25 mg total) by mouth 3 (three) times daily as needed for itching.   lidocaine-prilocaine (EMLA) cream Apply 1 Application topically as needed.   pentoxifylline (TRENTAL) 400 MG CR tablet Take 1 tablet (400 mg total) by mouth in the morning and at bedtime.   rivaroxaban (XARELTO) 20 MG TABS tablet Take 1 tablet (20 mg total) by mouth daily with supper. Start after completing 3 weeks of Xarelto 15 mg twice daily   triamcinolone (KENALOG) 0.025 % cream Apply 1 Application topically 2 (two) times daily. Apply to affected areas twice daily as needed   vitamin E 180 MG (400 UNITS) capsule Take 1 capsule (400 Units total) by mouth daily.     Allergies:   Testosterone and Sulfa antibiotics   Social History   Socioeconomic History   Marital status: Divorced    Spouse name: Not on file   Number of children: 0   Years of education: Not on file   Highest education level: Not on file  Occupational History   Not on file  Tobacco Use   Smoking status: Former    Types: Cigarettes    Quit date: 1998    Years since quitting: 26.1   Smokeless tobacco: Never  Vaping Use   Vaping Use: Never used  Substance and Sexual Activity   Alcohol use: Yes    Alcohol/week: 14.0 standard drinks of alcohol    Types: 14 Cans of beer per week    Comment: 2-3 beers per evening   Drug use: Not Currently   Sexual activity: Not on file  Other Topics Concern   Not on file  Social History  Narrative   Not on file   Social Determinants of Health   Financial Resource Strain: Not on file  Food Insecurity: Not on file  Transportation Needs: Not on file  Physical Activity: Not on file  Stress: Not on file  Social Connections: Not on file     Family History: The patient's family history includes Breast cancer in his maternal aunt; Breast cancer (age of onset: 94) in his cousin; Prostate cancer in his father; Stomach cancer in his maternal grandfather.  ROS:   ROS Please see the history of present illness.     All other systems reviewed and are negative.  EKGs/Labs/Other Studies Reviewed:    See history  Recent Labs: 07/24/2022: TSH 1.550 07/28/2022: ALT 12; BUN 13;  Creatinine 0.7; Hemoglobin 13.1; Platelets 390; Potassium 3.7; Sodium 138  Recent Lipid Panel No results found for: "CHOL", "TRIG", "HDL", "CHOLHDL", "VLDL", "LDLCALC", "LDLDIRECT"  Physical Exam:    VS:  BP 122/70 (BP Location: Right Arm, Patient Position: Sitting)   Pulse 98   Ht '5\' 9"'$  (1.753 m)   Wt 167 lb 12.8 oz (76.1 kg)   SpO2 97%   BMI 24.78 kg/m     Wt Readings from Last 3 Encounters:  08/12/22 167 lb 12.8 oz (76.1 kg)  07/28/22 172 lb 1.3 oz (78.1 kg)  07/28/22 172 lb 1.3 oz (78.1 kg)     GEN:  Well nourished, well developed in no acute distress HEENT: Normal NECK: No JVD; No carotid bruits LYMPHATICS: No lymphadenopathy CARDIAC: RRR, no murmurs, rubs, gallops RESPIRATORY:  Clear to auscultation without rales, wheezing or rhonchi  ABDOMEN: Soft, non-tender, non-distended MUSCULOSKELETAL:  No edema; No deformity  SKIN: Warm and dry NEUROLOGIC:  Alert and oriented x 3 PSYCHIATRIC:  Normal affect   He was seen concurrently with Leighton Ruff, CMA chaperone  Signed, Shirlee More, MD  08/12/2022 2:18 PM     [facility]

## 2022-08-12 ENCOUNTER — Encounter: Payer: Self-pay | Admitting: Cardiology

## 2022-08-12 ENCOUNTER — Encounter: Payer: Self-pay | Admitting: Oncology

## 2022-08-12 ENCOUNTER — Ambulatory Visit: Payer: Medicare Other | Attending: Cardiology | Admitting: Cardiology

## 2022-08-12 VITALS — BP 122/70 | HR 98 | Ht 69.0 in | Wt 167.8 lb

## 2022-08-12 DIAGNOSIS — C388 Malignant neoplasm of overlapping sites of heart, mediastinum and pleura: Secondary | ICD-10-CM | POA: Diagnosis not present

## 2022-08-12 DIAGNOSIS — R002 Palpitations: Secondary | ICD-10-CM | POA: Diagnosis not present

## 2022-08-12 DIAGNOSIS — C7931 Secondary malignant neoplasm of brain: Secondary | ICD-10-CM | POA: Insufficient documentation

## 2022-08-12 DIAGNOSIS — C7800 Secondary malignant neoplasm of unspecified lung: Secondary | ICD-10-CM | POA: Diagnosis not present

## 2022-08-12 DIAGNOSIS — Z7901 Long term (current) use of anticoagulants: Secondary | ICD-10-CM

## 2022-08-12 MED ORDER — ACEBUTOLOL HCL 200 MG PO CAPS: 200.0000 mg | ORAL_CAPSULE | Freq: Every day | ORAL | 3 refills | Status: DC

## 2022-08-12 NOTE — Patient Instructions (Addendum)
Medication Instructions:  Your physician has recommended you make the following change in your medication:   START: Sectral 200 mg daily  *If you need a refill on your cardiac medications before your next appointment, please call your pharmacy*   Lab Work: None If you have labs (blood work) drawn today and your tests are completely normal, you will receive your results only by: [place] (if you have MyChart) OR A paper copy in the mail If you have any lab test that is abnormal or we need to change your treatment, we will call you to review the results.   Testing/Procedures: Your physician has requested that you have an echocardiogram. Echocardiography is a painless test that uses sound waves to create images of your heart. It provides your doctor with information about the size and shape of your heart and how well your heart's chambers and valves are working. This procedure takes approximately one hour. There are no restrictions for this procedure. Please do NOT wear cologne, perfume, aftershave, or lotions (deodorant is allowed). Please arrive 15 minutes prior to your appointment time.    Follow-Up: At [facility], you and your health needs are our priority.  As part of our continuing mission to provide you with exceptional heart care, we have created designated Provider Care Teams.  These Care Teams include your primary Cardiologist (physician) and Advanced Practice Providers (APPs -  Physician Assistants and Nurse Practitioners) who all work together to provide you with the care you need, when you need it.  We recommend signing up for the patient portal called "MyChart".  Sign up information is provided on this After Visit Summary.  MyChart is used to connect with patients for Virtual Visits (Telemedicine).  Patients are able to view lab/test results, encounter notes, upcoming appointments, etc.  Non-urgent messages can be sent to your provider as well.   To learn more  about what you can do with MyChart, go to [email or web address].    Your next appointment:   6 month(s)  Provider:   Shirlee More, MD    Other Instructions This visit was accompanied by Leighton Ruff.      1. Avoid all over-the-counter antihistamines except Claritin/Loratadine and Zyrtec/Cetrizine. 2. Avoid all combination including cold sinus allergies flu decongestant and sleep medications 3. You can use Robitussin DM Mucinex and Mucinex DM for cough. 4. can use Tylenol aspirin ibuprofen and naproxen but no combinations such as sleep or sinus.

## 2022-08-14 ENCOUNTER — Inpatient Hospital Stay: Payer: 59 | Attending: Internal Medicine | Admitting: Internal Medicine

## 2022-08-14 DIAGNOSIS — C7931 Secondary malignant neoplasm of brain: Secondary | ICD-10-CM

## 2022-08-14 NOTE — Progress Notes (Signed)
I connected with Xavier Jones on 08/14/22 at  9:00 AM EST by telephone visit and verified that I am speaking with the correct person using two identifiers.  I discussed the limitations, risks, security and privacy concerns of performing an evaluation and management service by telemedicine and the availability of in-person appointments. I also discussed with the patient that there may be a patient responsible charge related to this service. The patient expressed understanding and agreed to proceed.  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  It is easier to open his left eye, there is less drooping of the face.  Weakness on the left is better overall.  Otherwise no new or progressive changes.  Current decadron dose is '4mg'$  BID.  Still getting nivolumab with Dr. Hinton Rao.  Observations: Language and cognition at baseline  Assessment and Plan: Malignant melanoma metastatic to brain [facility])  Follow Up Instructions:  Clinically improved with higher dose of decadron, Trental and Vitamin E. 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  Recommendations: -Decrease decadron to '4mg'$ /'2mg'$  x5 days, then '4mg'$  daily until next infusion on 08/25/22 -Will con't Vit E and Trental for radiation necrosis, BID dosing for now -Plan to keep brain MRI for 3/21 as requested, in person visit to follow  If his weakness recurs after nivolumab despite stable steroid regimen, we can consider adding on avastin to the next infusion to prevent inflammatory effects of immunotherapy.  I discussed the assessment and treatment plan with the patient.  The patient was provided an opportunity to ask questions and all were answered.  The patient agreed  with the plan and demonstrated understanding of the instructions.    The patient was advised to call back or seek an in-person evaluation if the symptoms worsen or if the condition fails to improve as anticipated.    [name], MD   I provided 25 minutes of non face-to-face telephone visit time during this encounter, and > 50% was spent counseling as documented under my assessment & plan.

## 2022-08-17 DIAGNOSIS — S0990XA Unspecified injury of head, initial encounter: Secondary | ICD-10-CM | POA: Diagnosis not present

## 2022-08-17 DIAGNOSIS — Z7901 Long term (current) use of anticoagulants: Secondary | ICD-10-CM | POA: Diagnosis not present

## 2022-08-17 DIAGNOSIS — Z86711 Personal history of pulmonary embolism: Secondary | ICD-10-CM | POA: Diagnosis not present

## 2022-08-17 DIAGNOSIS — S4992XA Unspecified injury of left shoulder and upper arm, initial encounter: Secondary | ICD-10-CM | POA: Diagnosis not present

## 2022-08-17 DIAGNOSIS — S2020XA Contusion of thorax, unspecified, initial encounter: Secondary | ICD-10-CM | POA: Diagnosis not present

## 2022-08-17 DIAGNOSIS — Z882 Allergy status to sulfonamides status: Secondary | ICD-10-CM | POA: Diagnosis not present

## 2022-08-17 DIAGNOSIS — S199XXA Unspecified injury of neck, initial encounter: Secondary | ICD-10-CM | POA: Diagnosis not present

## 2022-08-17 DIAGNOSIS — S42025A Nondisplaced fracture of shaft of left clavicle, initial encounter for closed fracture: Secondary | ICD-10-CM | POA: Diagnosis not present

## 2022-08-17 DIAGNOSIS — Z79899 Other long term (current) drug therapy: Secondary | ICD-10-CM | POA: Diagnosis not present

## 2022-08-17 DIAGNOSIS — S0003XA Contusion of scalp, initial encounter: Secondary | ICD-10-CM | POA: Diagnosis not present

## 2022-08-17 DIAGNOSIS — Z85820 Personal history of malignant melanoma of skin: Secondary | ICD-10-CM | POA: Diagnosis not present

## 2022-08-19 NOTE — Progress Notes (Signed)
Patient Care Team: Earlyne Iba, NP as PCP - General (Nurse Practitioner) Xavier Kaplan, MD as Consulting Physician (Oncology) Gatha Mayer, MD as Consulting Physician (Radiation Oncology)  Clinic Day: 08/21/22    Referring physician: Derwood Kaplan, MD  ASSESSMENT & PLAN:   Assessment & Plan: Melanoma of skin [facility]) Stage IIB malignant melanoma of the right posterior shoulder, Breslow's depth 3.3 mm, Clark level IV, treated with wide local excision, diagnosed in May 2021.  There was still a question as to whether this represented a metastatic lesion, rather than a primary, based on the presence of 2 nodules and no epidermal involvement.  MRI head and CT chest, abdomen and pelvis in June were negative for metastatic disease.   No adjuvant therapy was recommended.  PET scan in September was negative for metastatic disease at that time. Chest x-ray in June 2022 was negative.   Metastasis to subcutaneous tissue  He presented with a new area of concern to the left mid back in June of this year. A superficial nodule was noted approximately 1 cm to the right of his upper thoracic spine,and it had increased in size. He was sent for biopsy and that was performed on June 13 and confirmed metastatic malignant melanoma.   He has several hypermetabolic subcutaneous nodules seen on PET scan.  Metastasis to liver He has a small hypermetabolic liver lesion which is new and less than 1 cm but suspicious for metastasis.  Metastasis to lymph nodes He has multiple hypermetabolic nodes of the retroperitoneal and right pelvic area as well as right external iliac and a few additional nodes.  Pulmonary metastases He has bilateral hypermetabolic pulmonary nodules which are increased in size compared with the CT chest performed in May of this year.  The largest nodule is up to 2.3 cm in diameter.  Metastases to brain This was suspicious on the PET scan in the left frontoparietal lobe and so  he had an MRI of the brain.  This confirmed a 2.6 cm heterogeneous lesion of the left superior temporal gyrus with mild enhancement and extensive surrounding edema.  He also has an 8 mm cortical lesion of the inferomedial right frontal lobe with mild edema, a 2 mm mildly enhancing cortical lesion of the inferolateral left frontal lobe and a intrinsically T1 hyperintense lesion of the left lateral pons measuring 7 mm with surrounding edema.  He has 4 lesions in total but 3 are less than 1 cm.  He has received stereotactic radiation. He had neurologic symptoms in September and was placed on dexamethasone. Scans did show some cerebral edema so he was slowly tapered off.  He saw Dr. Mickeal Skinner with a repeat brain MRI in December, and they plan the next 1 on March 21..   Family history of breast cancer Maternal family history of breast cancer in addition to his melanoma history which raises suspicion for possible BRCA mutation.  He met with the genetic counselors and [facility] RNA + hereditary cancer panel revealed variants of uncertain significance (VUS) of the APC, CDKN1C and FH genes.  Skin Rash The actual skin rash is minimal and mainly involving his scalp, but he has severe pruritus. He is currently on a lose dose Prednisone '10mg'$  daily   Plan He completed his 4 cycles of combined immunotherapy. We resumed Nivolumab two weeks ago on June 02, 2022 without incident. He had a slight increase in his pruritus. He will get treated on Monday 06/16/22. We will plan to see him back  in 2 weeks with CBC and CMP for repeat evaluation before his maintenance nivolumab, every 2 weeks at first and then every 4 weeks. We will plan repeat CT scans, but we will postpone those since he has had some interruptions in his therapy.  His dexamethasone has been tapered from 6 mg daily to 4 mg daily now and he will go to 2 mg after 1 more week.. The patient understands the plans discussed today and is in agreement with them.  I have  answered his questions and reviewed this information in detail.The patient knows to contact our office if he develops concerns prior to his next appointment.   Xavier Kaplan, MD  [place] [address] [place] [address] Dept: [phone] Dept Fax: [phone]   No orders of the defined types were placed in this encounter.     CHIEF COMPLAINT:  CC: Metastatic melanoma  Current Treatment: Corticosteroids and brain irradiation, followed by immunotherapy  INTERVAL HISTORY:  Xavier Jones is here today for repeat clinical assessment of his Metastatic melanoma.  He had a recent syncopal episode where he fell Saturday and fractured his clavicle.  He does not remember how it happened and apparently fell face down.  He does not recall even getting checked by EMS but no seizure activity is described.  He has been on dexamethasone 6 mg daily and will go down to 4 mg now with the plan of going down to 2 mg after 1 more week.  Was given hydrocodone at the [facility] ER for the pain of his clavicle which she currently describes as 3 out of 10.  His appetite is good. His weight has decreased 1 pound in the last month.  He denies fevers or chills.  He denies any cardiorespiratory or gastrointestinal symptoms.  I have reviewed the past medical history, past surgical history, social history and family history with the patient and they are unchanged from previous note.   ALLERGIES:  is allergic to sulfa antibiotics and testosterone.  MEDICATIONS:  Current Outpatient Medications  Medication Sig Dispense Refill   HYDROcodone-acetaminophen (NORCO/VICODIN) 5-325 MG tablet Take by mouth.     acebutolol (SECTRAL) 200 MG capsule Take 1 capsule (200 mg total) by mouth daily. 90 capsule 3   acyclovir (ZOVIRAX) 400 MG tablet Take 400 mg by mouth 3 (three) times daily. Taking once daily per patient     amitriptyline (ELAVIL) 25 MG tablet Take 50  mg by mouth at bedtime.     dexamethasone (DECADRON) 4 MG tablet Take 1 tablet (4 mg total) by mouth 2 (two) times daily. 60 tablet 1   ergocalciferol (VITAMIN D2) 1.25 MG (50000 UT) capsule Take 50,000 Units by mouth every Wednesday.     hydrOXYzine (ATARAX) 25 MG tablet Take 1 tablet (25 mg total) by mouth 3 (three) times daily as needed for itching. 60 tablet 3   lidocaine-prilocaine (EMLA) cream Apply 1 Application topically as needed. 30 g 0   pentoxifylline (TRENTAL) 400 MG CR tablet Take 1 tablet (400 mg total) by mouth in the morning and at bedtime. 60 tablet 3   rivaroxaban (XARELTO) 20 MG TABS tablet Take 1 tablet (20 mg total) by mouth daily with supper. Start after completing 3 weeks of Xarelto 15 mg twice daily 30 tablet 3   triamcinolone (KENALOG) 0.025 % cream Apply 1 Application topically 2 (two) times daily. Apply to affected areas twice daily as needed 80 g 2  vitamin E 180 MG (400 UNITS) capsule Take 1 capsule (400 Units total) by mouth daily. 30 capsule 2   No current facility-administered medications for this visit.    HISTORY OF PRESENT ILLNESS:   Oncology History  Melanoma of skin ([place])  12/01/2019 Initial Diagnosis   Melanoma of skin ([place])   12/05/2019 Cancer Staging   Staging form: Melanoma of the Skin, AJCC 8th Edition - Clinical stage from 12/05/2019: Stage IIA (cT3a, cN0, cM0) - Signed by Xavier Kaplan, MD on 05/30/2020   02/18/2021 Genetic Testing   No pathogenic variants detected in [facility] Multi-Cancer +RNA Panel.  Variants of uncertain significance detected in APC (c.681C>G (p.Asp227Glu)), CDKN1C (c.610C>G (p.Pro204Ala)), and FH (c.1151C>T (p.Ala384Val)).  The report date is February 18, 2021.    The Multi-Cancer + RNA Panel offered by [facility] includes sequencing and/or deletion/duplication analysis of the following 84 genes:  AIP*, ALK, APC*, ATM*, AXIN2*, BAP1*, BARD1*, BLM*, BMPR1A*, BRCA1*, BRCA2*, BRIP1*, CASR, CDC73*, CDH1*, CDK4, CDKN1B*, CDKN1C*,  CDKN2A, CEBPA, CHEK2*, CTNNA1*, DICER1*, DIS3L2*, EGFR, EPCAM, FH*, FLCN*, GATA2*, GPC3, GREM1, HOXB13, HRAS, KIT, MAX*, MEN1*, MET, MITF, MLH1*, MSH2*, MSH3*, MSH6*, MUTYH*, NBN*, NF1*, NF2*, NTHL1*, PALB2*, PDGFRA, PHOX2B, PMS2*, POLD1*, POLE*, POT1*, PRKAR1A*, PTCH1*, PTEN*, RAD50*, RAD51C*, RAD51D*, RB1*, RECQL4, RET, RUNX1*, SDHA*, SDHAF2*, SDHB*, SDHC*, SDHD*, SMAD4*, SMARCA4*, SMARCB1*, SMARCE1*, STK11*, SUFU*, TERC, TERT, TMEM127*, Tp53*, TSC1*, TSC2*, VHL*, WRN*, and WT1.  RNA analysis is performed for * genes.   01/30/2022 - 02/20/2022 Chemotherapy   Patient is on Treatment Plan : MELANOMA Nivolumab + Ipilimumab (1/3) q21d / Nivolumab q28d     01/30/2022 -  Chemotherapy   Patient is on Treatment Plan : MELANOMA Nivolumab (480) q28d     Malignant melanoma metastatic to brain ([place])  12/19/2021 Initial Diagnosis   Malignant melanoma metastatic to brain ([place])   01/30/2022 - 02/20/2022 Chemotherapy   Patient is on Treatment Plan : MELANOMA Nivolumab + Ipilimumab (1/3) q21d / Nivolumab q28d     01/30/2022 -  Chemotherapy   Patient is on Treatment Plan : MELANOMA Nivolumab (480) q28d     Malignant melanoma metastatic to lung ([place])  12/19/2021 Initial Diagnosis   Malignant melanoma metastatic to lung ([place])   01/30/2022 - 02/20/2022 Chemotherapy   Patient is on Treatment Plan : MELANOMA Nivolumab + Ipilimumab (1/3) q21d / Nivolumab q28d     01/30/2022 -  Chemotherapy   Patient is on Treatment Plan : MELANOMA Nivolumab (480) q28d     03/24/2022 Imaging   CTA chest:  IMPRESSION:  1. Small nonocclusive segmental level pulmonary embolism in the  inferior LEFT upper lobe.  2. Marked bronchial wall thickening with areas of patchy basilar  opacities and new ground-glass opacities in the upper lobes.  Constellation of findings may be infectious bronchiolitis/bronchitis  though the possibility of drug related changes in the setting of  immunotherapy are also considered. Furthermore, given basilar   predominance and some material in basilar bronchial structures would  correlate with any risk factors for or history of aspiration.  3. Response to therapy of bilateral metastatic lesions. Some lymph  nodes in the chest with interval increase in size are equivocal at  this time, potentially reactive, but warrant attention on subsequent  imaging.    07/22/2022 Imaging   CT chest, abdomen and pelvis:  IMPRESSION:  1. Interval positive response to therapy. Resolved subcarinal and  bilateral hilar lymphadenopathy. Stable to decreased left pulmonary  nodules. No new or progressive metastatic disease in the chest,  abdomen or  pelvis.  2. One vessel coronary atherosclerosis.  3. Aortic Atherosclerosis (ICD10-I70.0).    Malignant neoplasm metastatic to liver ([place])  12/19/2021 Initial Diagnosis   Malignant neoplasm metastatic to liver ([place])   01/30/2022 - 02/20/2022 Chemotherapy   Patient is on Treatment Plan : MELANOMA Nivolumab + Ipilimumab (1/3) q21d / Nivolumab q28d     01/30/2022 -  Chemotherapy   Patient is on Treatment Plan : MELANOMA Nivolumab (480) q28d     07/22/2022 Imaging   CT chest, abdomen and pelvis:  IMPRESSION:  1. Interval positive response to therapy. Resolved subcarinal and  bilateral hilar lymphadenopathy. Stable to decreased left pulmonary  nodules. No new or progressive metastatic disease in the chest,  abdomen or pelvis.  2. One vessel coronary atherosclerosis.  3. Aortic Atherosclerosis (ICD10-I70.0).        REVIEW OF SYSTEMS:   Review of Systems  Constitutional:  Negative for appetite change, chills, fever and unexpected weight change.  HENT:  Negative.  Negative for lump/mass, mouth sores and sore throat.   Eyes: Negative.        States his vision are blurry  Respiratory: Negative.  Negative for chest tightness, cough, hemoptysis, shortness of breath and wheezing.   Cardiovascular: Negative.  Negative for chest pain, leg swelling and palpitations.   Gastrointestinal:  Positive for nausea. Negative for abdominal distention, abdominal pain, blood in stool, constipation, diarrhea and vomiting.  Endocrine: Negative.  Negative for hot flashes.  Genitourinary: Negative.  Negative for difficulty urinating, dysuria, frequency and hematuria.   Musculoskeletal:  Negative for arthralgias, back pain, flank pain and gait problem.  Skin:  Positive for itching (tolderable with treatment).  Neurological:  Positive for headaches (comes in waves). Negative for dizziness, extremity weakness, gait problem, light-headedness, numbness, seizures and speech difficulty.  Hematological: Negative.  Negative for adenopathy. Does not bruise/bleed easily.  Psychiatric/Behavioral: Negative.  Negative for depression and sleep disturbance. The patient is not nervous/anxious.    Marland Kitchen   VITALS:  Blood pressure 134/84, pulse 88, temperature 98.6 F (37 C), temperature source Oral, resp. rate 18, height '5\' 9"'$  (1.753 m), weight 166 lb 6.4 oz (75.5 kg), SpO2 99 %.  Wt Readings from Last 3 Encounters:  08/25/22 171 lb (77.6 kg)  08/21/22 166 lb 6.4 oz (75.5 kg)  08/12/22 167 lb 12.8 oz (76.1 kg)    Body mass index is 24.57 kg/m.  Performance status (ECOG): 1 - Symptomatic but completely ambulatory  PHYSICAL EXAM:    Physical Exam Musculoskeletal:     Comments: Large deep scar in the right posterior shoulder that is well healed.      GENERAL:alert, no distress and comfortable SKIN: skin color, texture, turgor are normal, no rashes or significant lesions.  He has an additional nodule of the left flank which is smaller but firm. EYES: normal, Conjunctiva are pink and non-injected, sclera clear OROPHARYNX:no exudate, no erythema and lips, buccal mucosa, and tongue normal  NECK: supple, thyroid normal size, non-tender, without nodularity LYMPH:  no palpable lymphadenopathy in the cervical, axillary or inguinal LUNGS: clear to auscultation and percussion with normal  breathing effort Occasional wheeze all lung feels except upper lobe HEART: regular rate & rhythm with tachycardia, and no lower extremity edema ABDOMEN:abdomen soft, non-tender and normal bowel sounds Musculoskeletal:no cyanosis of digits and no clubbing  NEURO: alert & oriented x 3 with fluent speech, no focal motor/sensory deficits Port colon right upper chest maculopapular rash surrounding superior portion. Looks like allergic rash  Large scars bilateral lower  back, posteriorly. Mass in upper thoracic posterior area measuring 4 x 4 cm ,  firm   LABORATORY DATA:  I have reviewed the data as listed    Component Value Date/Time   NA 139 08/21/2022 1314   NA 138 07/28/2022 0000   K 3.9 08/21/2022 1314   CL 98 08/21/2022 1314   CO2 29 08/21/2022 1314   GLUCOSE 134 (H) 08/21/2022 1314   BUN 16 08/21/2022 1314   BUN 13 07/28/2022 0000   CREATININE 1.01 08/21/2022 1314   CALCIUM 9.7 08/21/2022 1314   PROT 7.1 08/21/2022 1314   PROT 6.2 (A) 05/08/2022 0000   ALBUMIN 3.6 08/21/2022 1314   AST 22 08/21/2022 1314   ALT 18 08/21/2022 1314   ALKPHOS 48 08/21/2022 1314   BILITOT 0.7 08/21/2022 1314   GFRNONAA >60 08/21/2022 1314    No results found for: "SPEP", "UPEP"  Lab Results  Component Value Date   WBC 7.3 08/21/2022   NEUTROABS 6.2 08/21/2022   HGB 14.5 08/21/2022   HCT 44.2 08/21/2022   MCV 98.0 08/21/2022   PLT 338 08/21/2022      Chemistry      Component Value Date/Time   NA 139 08/21/2022 1314   NA 138 07/28/2022 0000   K 3.9 08/21/2022 1314   CL 98 08/21/2022 1314   CO2 29 08/21/2022 1314   BUN 16 08/21/2022 1314   BUN 13 07/28/2022 0000   CREATININE 1.01 08/21/2022 1314   GLU 93 07/28/2022 0000      Component Value Date/Time   CALCIUM 9.7 08/21/2022 1314   ALKPHOS 48 08/21/2022 1314   AST 22 08/21/2022 1314   ALT 18 08/21/2022 1314   BILITOT 0.7 08/21/2022 1314       RADIOGRAPHIC STUDIES: I have personally reviewed the radiological images as  listed and agreed with the findings in the report. ECHOCARDIOGRAM COMPLETE  Result Date: 08/20/2022    ECHOCARDIOGRAM REPORT   Patient Name:   WILEY BIBEY [facility] Date of Exam: 08/20/2022 Medical Rec #:  [id number]         Height:       69.0 in Accession #:    [id number]        Weight:       167.8 lb Date of Birth:  1970/07/30         BSA:          1.917 m Patient Age:    32 years          BP:           122/70 mmHg Patient Gender: M                 HR:           76 bpm. Exam Location:  [place] Procedure: 2D Echo, Color Doppler, Cardiac Doppler and Strain Analysis Indications:    Palpitations [R00.2 (ICD-10-CM)]; Malignant melanoma metastatic                 to brain ([place]) [C79.31 (ICD-10-CM)]; Chronic anticoagulation                 [Z79.01 (ICD-10-CM)]; Malignant melanoma metastatic to lung                 ([place]) [C78.00 (ICD-10-CM)]; Malignant neoplasm of overlapping                 sites of heart, mediastinum and pleura (HCC) [C38.8 (ICD-10-CM)]  History:  Patient has no prior history of Echocardiogram examinations.  Sonographer:    Luane School RDCS Referring Phys: [id number] [place]  Sonographer Comments: Restricted mobility due to clavicle fracture. IMPRESSIONS  1. Left ventricular ejection fraction, by estimation, is 55 to 60%. The left ventricle has normal function. The left ventricle has no regional wall motion abnormalities. There is mild concentric left ventricular hypertrophy. Left ventricular diastolic parameters are consistent with Grade I diastolic dysfunction (impaired relaxation).  2. Right ventricular systolic function is normal. The right ventricular size is normal. There is normal pulmonary artery systolic pressure.  3. The mitral valve is normal in structure. No evidence of mitral valve regurgitation. No evidence of mitral stenosis.  4. The aortic valve is tricuspid. Aortic valve regurgitation is not visualized. No aortic stenosis is present.  5. Aortic Normal DTA. There is borderline  dilatation of the ascending aorta, measuring 37 mm.  6. The inferior vena cava is normal in size with greater than 50% respiratory variability, suggesting right atrial pressure of 3 mmHg. FINDINGS  Left Ventricle: Left ventricular ejection fraction, by estimation, is 55 to 60%. The left ventricle has normal function. The left ventricle has no regional wall motion abnormalities. The left ventricular internal cavity size was normal in size. There is  mild concentric left ventricular hypertrophy. Left ventricular diastolic parameters are consistent with Grade I diastolic dysfunction (impaired relaxation). Normal left ventricular filling pressure. Right Ventricle: The right ventricular size is normal. No increase in right ventricular wall thickness. Right ventricular systolic function is normal. There is normal pulmonary artery systolic pressure. The tricuspid regurgitant velocity is 1.24 m/s, and  with an assumed right atrial pressure of 3 mmHg, the estimated right ventricular systolic pressure is 9.2 mmHg. Left Atrium: Left atrial size was normal in size. Right Atrium: Right atrial size was normal in size. Pericardium: There is no evidence of pericardial effusion. Mitral Valve: The mitral valve is normal in structure. No evidence of mitral valve regurgitation. No evidence of mitral valve stenosis. Tricuspid Valve: The tricuspid valve is normal in structure. Tricuspid valve regurgitation is trivial. No evidence of tricuspid stenosis. Aortic Valve: The aortic valve is tricuspid. Aortic valve regurgitation is not visualized. No aortic stenosis is present. Pulmonic Valve: The pulmonic valve was normal in structure. Pulmonic valve regurgitation is not visualized. No evidence of pulmonic stenosis. Aorta: The aortic arch was not well visualized, the aortic root and ascending aorta are structurally normal, with no evidence of dilitation and Normal DTA. There is borderline dilatation of the ascending aorta, measuring 37 mm.  Venous: The pulmonary veins were not well visualized. The inferior vena cava is normal in size with greater than 50% respiratory variability, suggesting right atrial pressure of 3 mmHg. IAS/Shunts: No atrial level shunt detected by color flow Doppler.  LEFT VENTRICLE PLAX 2D LVIDd:         4.70 cm   Diastology LVIDs:         3.20 cm   LV e' medial:    7.51 cm/s LV PW:         1.10 cm   LV E/e' medial:  9.9 LV IVS:        1.10 cm   LV e' lateral:   6.64 cm/s LVOT diam:     2.00 cm   LV E/e' lateral: 11.2 LV SV:         56 LV SV Index:   29 LVOT Area:     3.14 cm  RIGHT VENTRICLE  IVC RV S prime:     8.92 cm/s  IVC diam: 1.30 cm TAPSE (M-mode): 1.8 cm LEFT ATRIUM             Index        RIGHT ATRIUM           Index LA diam:        2.60 cm 1.36 cm/m   RA Area:     12.00 cm LA Vol (A2C):   46.8 ml 24.41 ml/m  RA Volume:   25.90 ml  13.51 ml/m LA Vol (A4C):   20.0 ml 10.43 ml/m LA Biplane Vol: 30.3 ml 15.80 ml/m  AORTIC VALVE LVOT Vmax:   80.30 cm/s LVOT Vmean:  55.100 cm/s LVOT VTI:    0.177 m  AORTA Ao Root diam: 3.80 cm Ao Asc diam:  3.70 cm Ao Desc diam: 2.20 cm MITRAL VALVE               TRICUSPID VALVE MV Area (PHT): 3.60 cm    TR Peak grad:   6.2 mmHg MV Decel Time: 211 msec    TR Vmax:        124.00 cm/s MV E velocity: 74.60 cm/s MV A velocity: 72.00 cm/s  SHUNTS MV E/A ratio:  1.04        Systemic VTI:  0.18 m                            Systemic Diam: 2.00 cm Shirlee More MD Electronically signed by Shirlee More MD Signature Date/Time: 08/20/2022/5:00:20 PM    Final    EXAM : 01/04/2022 MRI BRAIN W WO CONTRAST  FINDINGS: Brain: New left side craniotomy. Smooth new widespread left hemisphere pachymeningeal thickening is probably postoperative in nature (series 11, image 17). Posterior left temporal/operculum resection cavity with heterogeneous internal contents, both T1 intrinsic and isointense. Some evidence of marginal diffusion restriction (series 2, image 27). No definite enhancement  following contrast. Regional vasogenic edema and mass effect are mildly reduced.   T1 intrinsic left brainstem metastasis near the 7th/8th cranial nerve root entry zone is stable at 10 mm. Regional brainstem and left cerebellar peduncle edema does appear mildly increased on series 6, image 9.   Right inferior frontal gyrus T1 hypointense and enhancing metastasis is stable measuring 10 mm (series 10, image 41). Stable mild regional edema.   Nearby small contralateral left inferior frontal gyrus metastasis measuring 3-4 mm is stable on series 10, image 41, no edema.   No new brain metastasis identified.   Stable ventricle size and configuration. No other restricted diffusion. Cervicomedullary junction and pituitary are within normal limits. Normal basilar cisterns. Occasional small nonspecific cerebral white matter T2 and FLAIR hyperintense foci are stable.   Vascular: Major intracranial vascular flow voids are stable. The major dural venous sinuses are enhancing and appear to be patent.   Skull and upper cervical spine: Craniotomy. Negative visible cervical spine and spinal cord. Visualized bone marrow signal is within normal limits.   Sinuses/Orbits: Stable and negative.   Other: Mastoids are clear. Visible internal auditory structures appear normal. Postoperative changes to the left scalp.   IMPRESSION: 1. New left side craniotomy and gross total resection of left temporal/operculum metastasis. Mild resection margin ischemia is possible. Stable regional edema and mass effect. Mild smooth left hemisphere meningeal thickening is likely postoperative in nature.   2. The other three smaller brain metastases are stable, aside from mildly increased  associated brainstem and left cerebellar peduncle edema.   3. No new brain metastasis identified.     EXAM: 12/27/2021 MRI BRAIN W WO CONTRAST  FINDINGS: Brain: There are 4 unchanged intraparenchymal masses:   1. Left  temporal lobe, 3.0 cm series 11, image 85 2. Left frontal lobe 3 mm, image 79 3. Paramedian right frontal lobe 8 mm, image 75 4. Left middle cerebellar peduncle, 9 mm, image 41.   Edema surrounding the left temporal lesion is unchanged with regional mass effect but no midline shift. Evidence of chronic hemorrhage within the left temporal and left cerebellar peduncular lesion. No new lesions. No acute infarct. No extra-axial collection.   Vascular: Normal flow voids.   Skull and upper cervical spine: Normal marrow signal.   Sinuses/Orbits: Negative.   Other: None   IMPRESSION: Unchanged size and appearance of 4 intraparenchymal metastases. No new lesions.     I,Gabriella Ballesteros,acting as a scribe for Xavier Kaplan, MD.,have documented all relevant documentation on the behalf of Xavier Kaplan, MD,as directed by  Xavier Kaplan, MD while in the presence of Xavier Kaplan, MD.

## 2022-08-20 ENCOUNTER — Ambulatory Visit: Payer: Medicare Other | Attending: Cardiology

## 2022-08-20 DIAGNOSIS — C7931 Secondary malignant neoplasm of brain: Secondary | ICD-10-CM | POA: Insufficient documentation

## 2022-08-20 DIAGNOSIS — Z7901 Long term (current) use of anticoagulants: Secondary | ICD-10-CM | POA: Diagnosis not present

## 2022-08-20 DIAGNOSIS — S42022A Displaced fracture of shaft of left clavicle, initial encounter for closed fracture: Secondary | ICD-10-CM | POA: Diagnosis not present

## 2022-08-20 DIAGNOSIS — R002 Palpitations: Secondary | ICD-10-CM | POA: Insufficient documentation

## 2022-08-20 DIAGNOSIS — C7800 Secondary malignant neoplasm of unspecified lung: Secondary | ICD-10-CM | POA: Diagnosis not present

## 2022-08-20 DIAGNOSIS — C388 Malignant neoplasm of overlapping sites of heart, mediastinum and pleura: Secondary | ICD-10-CM | POA: Diagnosis not present

## 2022-08-20 DIAGNOSIS — S42025A Nondisplaced fracture of shaft of left clavicle, initial encounter for closed fracture: Secondary | ICD-10-CM | POA: Diagnosis not present

## 2022-08-20 LAB — ECHOCARDIOGRAM COMPLETE
Area-P 1/2: 3.6 cm2
S' Lateral: 3.2 cm

## 2022-08-21 ENCOUNTER — Encounter: Payer: Self-pay | Admitting: Oncology

## 2022-08-21 ENCOUNTER — Inpatient Hospital Stay: Payer: Medicare Other | Attending: Hematology and Oncology | Admitting: Oncology

## 2022-08-21 ENCOUNTER — Other Ambulatory Visit: Payer: Self-pay | Admitting: Oncology

## 2022-08-21 ENCOUNTER — Inpatient Hospital Stay: Payer: Medicare Other

## 2022-08-21 VITALS — BP 134/84 | HR 88 | Temp 98.6°F | Resp 18 | Ht 69.0 in | Wt 166.4 lb

## 2022-08-21 DIAGNOSIS — C7800 Secondary malignant neoplasm of unspecified lung: Secondary | ICD-10-CM | POA: Diagnosis not present

## 2022-08-21 DIAGNOSIS — C7801 Secondary malignant neoplasm of right lung: Secondary | ICD-10-CM | POA: Insufficient documentation

## 2022-08-21 DIAGNOSIS — Z5112 Encounter for antineoplastic immunotherapy: Secondary | ICD-10-CM | POA: Insufficient documentation

## 2022-08-21 DIAGNOSIS — C7802 Secondary malignant neoplasm of left lung: Secondary | ICD-10-CM | POA: Diagnosis not present

## 2022-08-21 DIAGNOSIS — C7931 Secondary malignant neoplasm of brain: Secondary | ICD-10-CM | POA: Insufficient documentation

## 2022-08-21 DIAGNOSIS — Z79620 Long term (current) use of immunosuppressive biologic: Secondary | ICD-10-CM | POA: Diagnosis not present

## 2022-08-21 DIAGNOSIS — C4361 Malignant melanoma of right upper limb, including shoulder: Secondary | ICD-10-CM | POA: Insufficient documentation

## 2022-08-21 DIAGNOSIS — C439 Malignant melanoma of skin, unspecified: Secondary | ICD-10-CM

## 2022-08-21 DIAGNOSIS — C787 Secondary malignant neoplasm of liver and intrahepatic bile duct: Secondary | ICD-10-CM

## 2022-08-21 DIAGNOSIS — C779 Secondary and unspecified malignant neoplasm of lymph node, unspecified: Secondary | ICD-10-CM | POA: Diagnosis not present

## 2022-08-21 LAB — CMP (CANCER CENTER ONLY)
ALT: 18 U/L (ref 0–44)
AST: 22 U/L (ref 15–41)
Albumin: 3.6 g/dL (ref 3.5–5.0)
Alkaline Phosphatase: 48 U/L (ref 38–126)
Anion gap: 12 (ref 5–15)
BUN: 16 mg/dL (ref 6–20)
CO2: 29 mmol/L (ref 22–32)
Calcium: 9.7 mg/dL (ref 8.9–10.3)
Chloride: 98 mmol/L (ref 98–111)
Creatinine: 1.01 mg/dL (ref 0.61–1.24)
GFR, Estimated: >60 mL/min (ref >60)
Glucose, Bld: 134 mg/dL — ABNORMAL HIGH (ref 70–99)
Potassium: 3.9 mmol/L (ref 3.5–5.1)
Sodium: 139 mmol/L (ref 135–145)
Total Bilirubin: 0.7 mg/dL (ref 0.3–1.2)
Total Protein: 7.1 g/dL (ref 6.5–8.1)

## 2022-08-21 LAB — CBC WITH DIFFERENTIAL (CANCER CENTER ONLY)
Abs Immature Granulocytes: 0.12 10*3/uL — ABNORMAL HIGH (ref 0.00–0.07)
Basophils Absolute: 0 10*3/uL (ref 0.0–0.1)
Basophils Relative: 0 %
Eosinophils Absolute: 0.1 10*3/uL (ref 0.0–0.5)
Eosinophils Relative: 1 %
HCT: 44.2 % (ref 39.0–52.0)
Hemoglobin: 14.5 g/dL (ref 13.0–17.0)
Immature Granulocytes: 2 %
Lymphocytes Relative: 8 %
Lymphs Abs: 0.6 10*3/uL — ABNORMAL LOW (ref 0.7–4.0)
MCH: 32.2 pg (ref 26.0–34.0)
MCHC: 32.8 g/dL (ref 30.0–36.0)
MCV: 98 fL (ref 80.0–100.0)
Monocytes Absolute: 0.3 10*3/uL (ref 0.1–1.0)
Monocytes Relative: 5 %
Neutro Abs: 6.2 10*3/uL (ref 1.7–7.7)
Neutrophils Relative %: 84 %
Platelet Count: 338 10*3/uL (ref 150–400)
RBC: 4.51 MIL/uL (ref 4.22–5.81)
RDW: 12.1 % (ref 11.5–15.5)
WBC Count: 7.3 10*3/uL (ref 4.0–10.5)
nRBC: 0 % (ref 0.0–0.2)

## 2022-08-21 LAB — TSH: TSH: 0.761 u[IU]/mL (ref 0.350–4.500)

## 2022-08-21 NOTE — Progress Notes (Signed)
Exam observed by Barnett Applebaum from [facility].

## 2022-08-22 LAB — T4: T4, Total: 7.4 ug/dL (ref 4.5–12.0)

## 2022-08-22 MED FILL — Nivolumab IV Soln 240 MG/24ML: INTRAVENOUS | Qty: 48 | Status: AC

## 2022-08-25 ENCOUNTER — Inpatient Hospital Stay: Payer: Medicare Other

## 2022-08-25 VITALS — BP 123/79 | HR 74 | Temp 97.7°F | Resp 18 | Ht 69.0 in | Wt 171.0 lb

## 2022-08-25 DIAGNOSIS — C7800 Secondary malignant neoplasm of unspecified lung: Secondary | ICD-10-CM

## 2022-08-25 DIAGNOSIS — C787 Secondary malignant neoplasm of liver and intrahepatic bile duct: Secondary | ICD-10-CM

## 2022-08-25 DIAGNOSIS — C7931 Secondary malignant neoplasm of brain: Secondary | ICD-10-CM | POA: Diagnosis not present

## 2022-08-25 DIAGNOSIS — C439 Malignant melanoma of skin, unspecified: Secondary | ICD-10-CM

## 2022-08-25 DIAGNOSIS — C7801 Secondary malignant neoplasm of right lung: Secondary | ICD-10-CM | POA: Diagnosis not present

## 2022-08-25 DIAGNOSIS — Z5112 Encounter for antineoplastic immunotherapy: Secondary | ICD-10-CM | POA: Diagnosis not present

## 2022-08-25 DIAGNOSIS — C4361 Malignant melanoma of right upper limb, including shoulder: Secondary | ICD-10-CM | POA: Diagnosis not present

## 2022-08-25 DIAGNOSIS — C779 Secondary and unspecified malignant neoplasm of lymph node, unspecified: Secondary | ICD-10-CM | POA: Diagnosis not present

## 2022-08-25 MED ORDER — HEPARIN SOD (PORK) LOCK FLUSH 100 UNIT/ML IV SOLN
500.0000 [IU] | Freq: Once | INTRAVENOUS | Status: AC | PRN
  Administered 2022-08-25: 500 [IU]

## 2022-08-25 MED ORDER — SODIUM CHLORIDE 0.9 % IV SOLN
480.0000 mg | Freq: Once | INTRAVENOUS | Status: AC
  Administered 2022-08-25: 480 mg via INTRAVENOUS
  Filled 2022-08-25: qty 48

## 2022-08-25 MED ORDER — SODIUM CHLORIDE 0.9% FLUSH
10.0000 mL | INTRAVENOUS | Status: DC | PRN
  Administered 2022-08-25: 10 mL

## 2022-08-25 MED ORDER — SODIUM CHLORIDE 0.9 % IV SOLN: Freq: Once | INTRAVENOUS | Status: AC

## 2022-08-25 NOTE — Patient Instructions (Signed)
Nivolumab Injection What is this medication? NIVOLUMAB (nye VOL ue mab) treats some types of cancer. It works by helping your immune system slow or stop the spread of cancer cells. It is a monoclonal antibody. This medicine may be used for other purposes; ask your health care provider or pharmacist if you have questions. COMMON BRAND NAME(S): Opdivo What should I tell my care team before I take this medication? They need to know if you have any of these conditions: Allogeneic stem cell transplant (uses someone else's stem cells) Autoimmune diseases, such as Crohn disease, ulcerative colitis, lupus History of chest radiation Nervous system problems, such as Guillain-Barre syndrome or myasthenia gravis Organ transplant An unusual or allergic reaction to nivolumab, other medications, foods, dyes, or preservatives Pregnant or trying to get pregnant Breast-feeding How should I use this medication? This medication is infused into a vein. It is given in a hospital or clinic setting. A special MedGuide will be given to you before each treatment. Be sure to read this information carefully each time. Talk to your care team about the use of this medication in children. While it may be prescribed for children as young as 12 years for selected conditions, precautions do apply. Overdosage: If you think you have taken too much of this medicine contact a poison control center or emergency room at once. NOTE: This medicine is only for you. Do not share this medicine with others. What if I miss a dose? Keep appointments for follow-up doses. It is important not to miss your dose. Call your care team if you are unable to keep an appointment. What may interact with this medication? Interactions have not been studied. This list may not describe all possible interactions. Give your health care provider a list of all the medicines, herbs, non-prescription drugs, or dietary supplements you use. Also tell them if you  smoke, drink alcohol, or use illegal drugs. Some items may interact with your medicine. What should I watch for while using this medication? Your condition will be monitored carefully while you are receiving this medication. You may need blood work while taking this medication. This medication may cause serious skin reactions. They can happen weeks to months after starting the medication. Contact your care team right away if you notice fevers or flu-like symptoms with a rash. The rash may be red or purple and then turn into blisters or peeling of the skin. You may also notice a red rash with swelling of the face, lips, or lymph nodes in your neck or under your arms. Tell your care team right away if you have any change in your eyesight. Talk to your care team if you are pregnant or think you might be pregnant. A negative pregnancy test is required before starting this medication. A reliable form of contraception is recommended while taking this medication and for 5 months after the last dose. Talk to your care team about effective forms of contraception. Do not breast-feed while taking this medication and for 5 months after the last dose. What side effects may I notice from receiving this medication? Side effects that you should report to your care team as soon as possible: Allergic reactions--skin rash, itching, hives, swelling of the face, lips, tongue, or throat Dry cough, shortness of breath or trouble breathing Eye pain, redness, irritation, or discharge with blurry or decreased vision Heart muscle inflammation--unusual weakness or fatigue, shortness of breath, chest pain, fast or irregular heartbeat, dizziness, swelling of the ankles, feet, or hands Hormone  gland problems--headache, sensitivity to light, unusual weakness or fatigue, dizziness, fast or irregular heartbeat, increased sensitivity to cold or heat, excessive sweating, constipation, hair loss, increased thirst or amount of urine,  tremors or shaking, irritability Infusion reactions--chest pain, shortness of breath or trouble breathing, feeling faint or lightheaded Kidney injury (glomerulonephritis)--decrease in the amount of urine, red or dark brown urine, foamy or bubbly urine, swelling of the ankles, hands, or feet Liver injury--right upper belly pain, loss of appetite, nausea, light-colored stool, dark yellow or brown urine, yellowing skin or eyes, unusual weakness or fatigue Pain, tingling, or numbness in the hands or feet, muscle weakness, change in vision, confusion or trouble speaking, loss of balance or coordination, trouble walking, seizures Rash, fever, and swollen lymph nodes Redness, blistering, peeling, or loosening of the skin, including inside the mouth Sudden or severe stomach pain, bloody diarrhea, fever, nausea, vomiting Side effects that usually do not require medical attention (report these to your care team if they continue or are bothersome): Bone, joint, or muscle pain Diarrhea Fatigue Loss of appetite Nausea Skin rash This list may not describe all possible side effects. Call your doctor for medical advice about side effects. You may report side effects to FDA at 1-800-FDA-1088. Where should I keep my medication? This medication is given in a hospital or clinic. It will not be stored at home. NOTE: This sheet is a summary. It may not cover all possible information. If you have questions about this medicine, talk to your doctor, pharmacist, or health care provider.  2023 [employer] (2021-10-21 00:00:00)

## 2022-08-27 ENCOUNTER — Encounter: Payer: Self-pay | Admitting: Oncology

## 2022-08-29 ENCOUNTER — Encounter: Payer: Self-pay | Admitting: Oncology

## 2022-09-01 ENCOUNTER — Encounter: Payer: Self-pay | Admitting: Oncology

## 2022-09-02 ENCOUNTER — Encounter: Payer: Self-pay | Admitting: Oncology

## 2022-09-02 NOTE — Progress Notes (Shared)
Patient Care Team: Earlyne Iba, NP as PCP - General (Nurse Practitioner) Derwood Kaplan, MD as Consulting Physician (Oncology) Gatha Mayer, MD as Consulting Physician (Radiation Oncology)  Clinic Day: 09/02/22    Referring physician: Earlyne Iba, NP  ASSESSMENT & PLAN:   Assessment & Plan: Melanoma of skin ([place]) Stage IIB malignant melanoma of the right posterior shoulder, Breslow's depth 3.3 mm, Clark level IV, treated with wide local excision, diagnosed in May 2021.  There was still a question as to whether this represented a metastatic lesion, rather than a primary, based on the presence of 2 nodules and no epidermal involvement.  MRI head and CT chest, abdomen and pelvis in June were negative for metastatic disease.   No adjuvant therapy was recommended.  PET scan in September was negative for metastatic disease at that time. Chest x-ray in June 2022 was negative.   Metastasis to subcutaneous tissue  He presented with a new area of concern to the left mid back in June of this year. A superficial nodule was noted approximately 1 cm to the right of his upper thoracic spine,and it had increased in size. He was sent for biopsy and that was performed on June 13 and confirmed metastatic malignant melanoma.   He has several hypermetabolic subcutaneous nodules seen on PET scan.  Metastasis to liver He has a small hypermetabolic liver lesion which is new and less than 1 cm but suspicious for metastasis.  Metastasis to lymph nodes He has multiple hypermetabolic nodes of the retroperitoneal and right pelvic area as well as right external iliac and a few additional nodes.  Pulmonary metastases He has bilateral hypermetabolic pulmonary nodules which are increased in size compared with the CT chest performed in May of this year.  The largest nodule is up to 2.3 cm in diameter.  Metastases to brain This was suspicious on the PET scan in the left frontoparietal lobe and so he had  an MRI of the brain.  This confirmed a 2.6 cm heterogeneous lesion of the left superior temporal gyrus with mild enhancement and extensive surrounding edema.  He also has an 8 mm cortical lesion of the inferomedial right frontal lobe with mild edema, a 2 mm mildly enhancing cortical lesion of the inferolateral left frontal lobe and a intrinsically T1 hyperintense lesion of the left lateral pons measuring 7 mm with surrounding edema.  He has 4 lesions in total but 3 are less than 1 cm.  He has received stereotactic radiation. He had neurologic symptoms in September and was placed on dexamethasone. Scans did show some cerebral edema so he was slowly tapered off.  Will be due to see Dr. Mickeal Skinner with a repeat brain MRI later this month.   Family history of breast cancer Maternal family history of breast cancer in addition to his melanoma history which raises suspicion for possible BRCA mutation.  He met with the genetic counselors and [facility] RNA + hereditary cancer panel revealed variants of uncertain significance (VUS) of the APC, CDKN1C and FH genes.  Skin Rash The actual skin rash is minimal and mainly involving his scalp, but he has severe pruritus. He is currently on a lose dose Prednisone '10mg'$  daily   Plan He completed his 4 cycles of combined immunotherapy. We resumed Nivolumab two weeks ago on June 02, 2022 without incident. He had a slight increase in his pruritus. He will get treated on Monday 06/16/22. We will plan to see him back in 2 weeks with CBC  and CMP for repeat evaluation before his maintenance nivolumab, every 2 weeks at first and then every 4 weeks. We will plan repeat CT scans in January. The patient understands the plans discussed today and is in agreement with them.  I have answered his questions and reviewed this information in detail.The patient knows to contact our office if he develops concerns prior to his next appointment.   Conard Novak  [facility] AT [address] [facility] [address] [place] [address] Dept: [phone] Dept Fax: [phone]   No orders of the defined types were placed in this encounter.     CHIEF COMPLAINT:  CC: Metastatic melanoma  Current Treatment: Corticosteroids and brain irradiation, followed by immunotherapy  INTERVAL HISTORY:  Deondra is here today for repeat clinical assessment of his Metastatic melanoma.       Patient states that he has headaches that have started again since yesterday and comes in waves that causes him to be nauseous. He is taking Hydrocodone to help alleviate his headaches.   His skin itching is tolerable so I recommend we go ahead and treat with Nivolumab on 06/16/2022.   He will stay on a low does of Prednisone '10mg'$  daily.   He has a MRI on the 13th and follow up with Dr. Mickeal Skinner on 06/23/22.   His labs are pending.   His appetite is good. His weight has increased 8lbs in the last mont.Marland Kitchen He denies pain.   I have reviewed the past medical history, past surgical history, social history and family history with the patient and they are unchanged from previous note.   ALLERGIES:  is allergic to sulfa antibiotics and testosterone.  MEDICATIONS:  Current Outpatient Medications  Medication Sig Dispense Refill   acebutolol (SECTRAL) 200 MG capsule Take 1 capsule (200 mg total) by mouth daily. 90 capsule 3   acyclovir (ZOVIRAX) 400 MG tablet Take 400 mg by mouth 3 (three) times daily. Taking once daily per patient     amitriptyline (ELAVIL) 25 MG tablet Take 50 mg by mouth at bedtime.     dexamethasone (DECADRON) 4 MG tablet Take 1 tablet (4 mg total) by mouth 2 (two) times daily. 60 tablet 1   ergocalciferol (VITAMIN D2) 1.25 MG (50000 UT) capsule Take 50,000 Units by mouth every Wednesday.     HYDROcodone-acetaminophen (NORCO/VICODIN) 5-325 MG tablet Take by mouth.     hydrOXYzine (ATARAX) 25 MG tablet Take 1 tablet (25 mg total) by  mouth 3 (three) times daily as needed for itching. 60 tablet 3   lidocaine-prilocaine (EMLA) cream Apply 1 Application topically as needed. 30 g 0   pentoxifylline (TRENTAL) 400 MG CR tablet Take 1 tablet (400 mg total) by mouth in the morning and at bedtime. 60 tablet 3   rivaroxaban (XARELTO) 20 MG TABS tablet Take 1 tablet (20 mg total) by mouth daily with supper. Start after completing 3 weeks of Xarelto 15 mg twice daily 30 tablet 3   triamcinolone (KENALOG) 0.025 % cream Apply 1 Application topically 2 (two) times daily. Apply to affected areas twice daily as needed 80 g 2   vitamin E 180 MG (400 UNITS) capsule Take 1 capsule (400 Units total) by mouth daily. 30 capsule 2   No current facility-administered medications for this visit.    HISTORY OF PRESENT ILLNESS:   Oncology History  Melanoma of skin ([place])  12/01/2019 Initial Diagnosis   Melanoma of skin ([place])   12/05/2019 Cancer Staging  Staging form: Melanoma of the Skin, AJCC 8th Edition - Clinical stage from 12/05/2019: Stage IIA (cT3a, cN0, cM0) - Signed by Derwood Kaplan, MD on 05/30/2020   02/18/2021 Genetic Testing   No pathogenic variants detected in [facility] Multi-Cancer +RNA Panel.  Variants of uncertain significance detected in APC (c.681C>G (p.Asp227Glu)), CDKN1C (c.610C>G (p.Pro204Ala)), and FH (c.1151C>T (p.Ala384Val)).  The report date is February 18, 2021.    The Multi-Cancer + RNA Panel offered by [facility] includes sequencing and/or deletion/duplication analysis of the following 84 genes:  AIP*, ALK, APC*, ATM*, AXIN2*, BAP1*, BARD1*, BLM*, BMPR1A*, BRCA1*, BRCA2*, BRIP1*, CASR, CDC73*, CDH1*, CDK4, CDKN1B*, CDKN1C*, CDKN2A, CEBPA, CHEK2*, CTNNA1*, DICER1*, DIS3L2*, EGFR, EPCAM, FH*, FLCN*, GATA2*, GPC3, GREM1, HOXB13, HRAS, KIT, MAX*, MEN1*, MET, MITF, MLH1*, MSH2*, MSH3*, MSH6*, MUTYH*, NBN*, NF1*, NF2*, NTHL1*, PALB2*, PDGFRA, PHOX2B, PMS2*, POLD1*, POLE*, POT1*, PRKAR1A*, PTCH1*, PTEN*, RAD50*, RAD51C*, RAD51D*,  RB1*, RECQL4, RET, RUNX1*, SDHA*, SDHAF2*, SDHB*, SDHC*, SDHD*, SMAD4*, SMARCA4*, SMARCB1*, SMARCE1*, STK11*, SUFU*, TERC, TERT, TMEM127*, Tp53*, TSC1*, TSC2*, VHL*, WRN*, and WT1.  RNA analysis is performed for * genes.   01/30/2022 - 02/20/2022 Chemotherapy   Patient is on Treatment Plan : MELANOMA Nivolumab + Ipilimumab (1/3) q21d / Nivolumab q28d     01/30/2022 -  Chemotherapy   Patient is on Treatment Plan : MELANOMA Nivolumab (480) q28d     Malignant melanoma metastatic to brain ([place])  12/19/2021 Initial Diagnosis   Malignant melanoma metastatic to brain ([place])   01/30/2022 - 02/20/2022 Chemotherapy   Patient is on Treatment Plan : MELANOMA Nivolumab + Ipilimumab (1/3) q21d / Nivolumab q28d     01/30/2022 -  Chemotherapy   Patient is on Treatment Plan : MELANOMA Nivolumab (480) q28d     Malignant melanoma metastatic to lung ([place])  12/19/2021 Initial Diagnosis   Malignant melanoma metastatic to lung ([place])   01/30/2022 - 02/20/2022 Chemotherapy   Patient is on Treatment Plan : MELANOMA Nivolumab + Ipilimumab (1/3) q21d / Nivolumab q28d     01/30/2022 -  Chemotherapy   Patient is on Treatment Plan : MELANOMA Nivolumab (480) q28d     03/24/2022 Imaging   CTA chest:  IMPRESSION:  1. Small nonocclusive segmental level pulmonary embolism in the  inferior LEFT upper lobe.  2. Marked bronchial wall thickening with areas of patchy basilar  opacities and new ground-glass opacities in the upper lobes.  Constellation of findings may be infectious bronchiolitis/bronchitis  though the possibility of drug related changes in the setting of  immunotherapy are also considered. Furthermore, given basilar  predominance and some material in basilar bronchial structures would  correlate with any risk factors for or history of aspiration.  3. Response to therapy of bilateral metastatic lesions. Some lymph  nodes in the chest with interval increase in size are equivocal at  this time, potentially  reactive, but warrant attention on subsequent  imaging.    07/22/2022 Imaging   CT chest, abdomen and pelvis:  IMPRESSION:  1. Interval positive response to therapy. Resolved subcarinal and  bilateral hilar lymphadenopathy. Stable to decreased left pulmonary  nodules. No new or progressive metastatic disease in the chest,  abdomen or pelvis.  2. One vessel coronary atherosclerosis.  3. Aortic Atherosclerosis (ICD10-I70.0).    Malignant neoplasm metastatic to liver ([place])  12/19/2021 Initial Diagnosis   Malignant neoplasm metastatic to liver ([place])   01/30/2022 - 02/20/2022 Chemotherapy   Patient is on Treatment Plan : MELANOMA Nivolumab + Ipilimumab (1/3) q21d / Nivolumab q28d     01/30/2022 -  Chemotherapy   Patient is on Treatment Plan : MELANOMA Nivolumab (480) q28d     07/22/2022 Imaging   CT chest, abdomen and pelvis:  IMPRESSION:  1. Interval positive response to therapy. Resolved subcarinal and  bilateral hilar lymphadenopathy. Stable to decreased left pulmonary  nodules. No new or progressive metastatic disease in the chest,  abdomen or pelvis.  2. One vessel coronary atherosclerosis.  3. Aortic Atherosclerosis (ICD10-I70.0).        REVIEW OF SYSTEMS:   Review of Systems  Constitutional:  Negative for appetite change, chills, fever and unexpected weight change.  HENT:  Negative.  Negative for lump/mass, mouth sores and sore throat.   Eyes: Negative.        States his vision are blurry  Respiratory: Negative.  Negative for chest tightness, cough, hemoptysis, shortness of breath and wheezing.   Cardiovascular: Negative.  Negative for chest pain, leg swelling and palpitations.  Gastrointestinal:  Positive for nausea. Negative for abdominal distention, abdominal pain, blood in stool, constipation, diarrhea and vomiting.  Endocrine: Negative.  Negative for hot flashes.  Genitourinary: Negative.  Negative for difficulty urinating, dysuria, frequency and hematuria.    Musculoskeletal:  Negative for arthralgias, back pain, flank pain and gait problem.  Skin:  Positive for itching (tolderable with treatment).  Neurological:  Positive for headaches (comes in waves). Negative for dizziness, extremity weakness, gait problem, light-headedness, numbness, seizures and speech difficulty.  Hematological: Negative.  Negative for adenopathy. Does not bruise/bleed easily.  Psychiatric/Behavioral: Negative.  Negative for depression and sleep disturbance. The patient is not nervous/anxious.    Marland Kitchen   VITALS:  There were no vitals taken for this visit.  Wt Readings from Last 3 Encounters:  08/25/22 171 lb (77.6 kg)  08/21/22 166 lb 6.4 oz (75.5 kg)  08/12/22 167 lb 12.8 oz (76.1 kg)    There is no height or weight on file to calculate BMI.  Performance status (ECOG): 1 - Symptomatic but completely ambulatory  PHYSICAL EXAM:    Physical Exam Musculoskeletal:     Comments: Large deep scar in the right posterior shoulder that is well healed.      GENERAL:alert, no distress and comfortable SKIN: skin color, texture, turgor are normal, no rashes or significant lesions.  He has an additional nodule of the left flank which is smaller but firm. EYES: normal, Conjunctiva are pink and non-injected, sclera clear OROPHARYNX:no exudate, no erythema and lips, buccal mucosa, and tongue normal  NECK: supple, thyroid normal size, non-tender, without nodularity LYMPH:  no palpable lymphadenopathy in the cervical, axillary or inguinal LUNGS: clear to auscultation and percussion with normal breathing effort Occasional wheeze all lung feels except upper lobe HEART: regular rate & rhythm with tachycardia, and no lower extremity edema ABDOMEN:abdomen soft, non-tender and normal bowel sounds Musculoskeletal:no cyanosis of digits and no clubbing  NEURO: alert & oriented x 3 with fluent speech, no focal motor/sensory deficits Port colon right upper chest maculopapular rash  surrounding superior portion. Looks like allergic rash  Large scars bilateral lower back, posteriorly. Mass in upper thoracic posterior area measuring 4 x 4 cm ,  firm   LABORATORY DATA:  I have reviewed the data as listed    Component Value Date/Time   NA 139 08/21/2022 1314   NA 138 07/28/2022 0000   K 3.9 08/21/2022 1314   CL 98 08/21/2022 1314   CO2 29 08/21/2022 1314   GLUCOSE 134 (H) 08/21/2022 1314   BUN 16 08/21/2022 1314   BUN 13  07/28/2022 0000   CREATININE 1.01 08/21/2022 1314   CALCIUM 9.7 08/21/2022 1314   PROT 7.1 08/21/2022 1314   PROT 6.2 (A) 05/08/2022 0000   ALBUMIN 3.6 08/21/2022 1314   AST 22 08/21/2022 1314   ALT 18 08/21/2022 1314   ALKPHOS 48 08/21/2022 1314   BILITOT 0.7 08/21/2022 1314   GFRNONAA >60 08/21/2022 1314    No results found for: "SPEP", "UPEP"  Lab Results  Component Value Date   WBC 7.3 08/21/2022   NEUTROABS 6.2 08/21/2022   HGB 14.5 08/21/2022   HCT 44.2 08/21/2022   MCV 98.0 08/21/2022   PLT 338 08/21/2022      Chemistry      Component Value Date/Time   NA 139 08/21/2022 1314   NA 138 07/28/2022 0000   K 3.9 08/21/2022 1314   CL 98 08/21/2022 1314   CO2 29 08/21/2022 1314   BUN 16 08/21/2022 1314   BUN 13 07/28/2022 0000   CREATININE 1.01 08/21/2022 1314   GLU 93 07/28/2022 0000      Component Value Date/Time   CALCIUM 9.7 08/21/2022 1314   ALKPHOS 48 08/21/2022 1314   AST 22 08/21/2022 1314   ALT 18 08/21/2022 1314   BILITOT 0.7 08/21/2022 1314       RADIOGRAPHIC STUDIES: I have personally reviewed the radiological images as listed and agreed with the findings in the report. ECHOCARDIOGRAM COMPLETE  Result Date: 08/20/2022    ECHOCARDIOGRAM REPORT   Patient Name:   KALUM WALLAR [facility] Date of Exam: 08/20/2022 Medical Rec #:  [id number]         Height:       69.0 in Accession #:    [id number]        Weight:       167.8 lb Date of Birth:  May 16, 1971         BSA:          1.917 m Patient Age:    52 years           BP:           122/70 mmHg Patient Gender: M                 HR:           76 bpm. 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  Sonographer:    Luane School RDCS Referring Phys: [id number] Mallory  Sonographer Comments: Restricted mobility due to clavicle fracture. IMPRESSIONS  1. Left ventricular ejection fraction, by estimation, is 55 to 60%. The left ventricle has normal function. The left ventricle has no regional wall motion abnormalities. There is mild concentric left ventricular hypertrophy. Left ventricular diastolic parameters are consistent with Grade I diastolic dysfunction (impaired  relaxation).  2. Right ventricular systolic function is normal. The right ventricular size is normal. There is normal pulmonary artery systolic pressure.  3. The mitral valve is normal in structure. No evidence of mitral valve regurgitation. No evidence of mitral stenosis.  4. The aortic valve is tricuspid. Aortic valve regurgitation is not visualized. No aortic stenosis is present.  5. Aortic Normal DTA. There is borderline dilatation of the ascending aorta, measuring 37 mm.  6. The inferior vena cava is normal in size with greater than 50% respiratory variability, suggesting right atrial pressure of 3 mmHg. FINDINGS  Left Ventricle: Left ventricular ejection fraction, by estimation, is 55 to 60%. The left ventricle has normal function. The left ventricle has no regional wall motion abnormalities. The left  ventricular internal cavity size was normal in size. There is  mild concentric left ventricular hypertrophy. Left ventricular diastolic parameters are consistent with Grade I diastolic dysfunction (impaired relaxation). Normal left ventricular filling pressure. Right Ventricle: The right ventricular size is normal. No increase in right ventricular wall thickness. Right ventricular systolic function is normal. There is normal pulmonary artery systolic pressure. The tricuspid regurgitant velocity is 1.24 m/s, and  with an assumed right atrial pressure of 3 mmHg, the estimated right ventricular systolic pressure is 9.2 mmHg. Left Atrium: Left atrial size was normal in size. Right Atrium: Right atrial size was normal in size. Pericardium: There is no evidence of pericardial effusion. Mitral Valve: The mitral valve is normal in structure. No evidence of mitral valve regurgitation. No evidence of mitral valve stenosis. Tricuspid Valve: The tricuspid valve is normal in structure. Tricuspid valve regurgitation is trivial. No evidence of tricuspid stenosis. Aortic Valve: The aortic valve is tricuspid. Aortic valve regurgitation is not visualized. No aortic stenosis is present. Pulmonic Valve: The pulmonic valve was normal in structure. Pulmonic valve regurgitation is not visualized. No evidence of pulmonic stenosis. Aorta: The aortic arch was not well visualized, the aortic root and ascending aorta are structurally normal, with no evidence of dilitation and Normal DTA. There is borderline dilatation of the ascending aorta, measuring 37 mm. Venous: The pulmonary veins were not well visualized. The inferior vena cava is normal in size with greater than 50% respiratory variability, suggesting right atrial pressure of 3 mmHg. IAS/Shunts: No atrial level shunt detected by color flow Doppler.  LEFT VENTRICLE PLAX 2D LVIDd:         4.70 cm   Diastology LVIDs:         3.20 cm   LV e' medial:    7.51 cm/s LV PW:         1.10 cm   LV  E/e' medial:  9.9 LV IVS:        1.10 cm   LV e' lateral:   6.64 cm/s LVOT diam:     2.00 cm   LV E/e' lateral: 11.2 LV SV:         56 LV SV Index:   29 LVOT Area:     3.14 cm  RIGHT VENTRICLE            IVC RV S prime:     8.92 cm/s  IVC diam: 1.30 cm TAPSE (M-mode): 1.8 cm LEFT ATRIUM             Index        RIGHT ATRIUM           Index LA diam:        2.60 cm 1.36 cm/m  RA Area:     12.00 cm LA Vol (A2C):   46.8 ml 24.41 ml/m  RA Volume:   25.90 ml  13.51 ml/m LA Vol (A4C):   20.0 ml 10.43 ml/m LA Biplane Vol: 30.3 ml 15.80 ml/m  AORTIC VALVE LVOT Vmax:   80.30 cm/s LVOT Vmean:  55.100 cm/s LVOT VTI:    0.177 m  AORTA Ao Root diam: 3.80 cm Ao Asc diam:  3.70 cm Ao Desc diam: 2.20 cm MITRAL VALVE               TRICUSPID VALVE MV Area (PHT): 3.60 cm    TR Peak grad:   6.2 mmHg MV Decel Time: 211 msec    TR Vmax:        124.00 cm/s MV E velocity: 74.60 cm/s MV A velocity: 72.00 cm/s  SHUNTS MV E/A ratio:  1.04        Systemic VTI:  0.18 m                            Systemic Diam: 2.00 cm Shirlee More MD Electronically signed by Shirlee More MD Signature Date/Time: 08/20/2022/5:00:20 PM    Final    EXAM : 01/04/2022 MRI BRAIN W WO CONTRAST  FINDINGS: Brain: New left side craniotomy. Smooth new widespread left hemisphere pachymeningeal thickening is probably postoperative in nature (series 11, image 17). Posterior left temporal/operculum resection cavity with heterogeneous internal contents, both T1 intrinsic and isointense. Some evidence of marginal diffusion restriction (series 2, image 27). No definite enhancement following contrast. Regional vasogenic edema and mass effect are mildly reduced.   T1 intrinsic left brainstem metastasis near the 7th/8th cranial nerve root entry zone is stable at 10 mm. Regional brainstem and left cerebellar peduncle edema does appear mildly increased on series 6, image 9.   Right inferior frontal gyrus T1 hypointense and enhancing metastasis is stable  measuring 10 mm (series 10, image 41). Stable mild regional edema.   Nearby small contralateral left inferior frontal gyrus metastasis measuring 3-4 mm is stable on series 10, image 41, no edema.   No new brain metastasis identified.   Stable ventricle size and configuration. No other restricted diffusion. Cervicomedullary junction and pituitary are within normal limits. Normal basilar cisterns. Occasional small nonspecific cerebral white matter T2 and FLAIR hyperintense foci are stable.   Vascular: Major intracranial vascular flow voids are stable. The major dural venous sinuses are enhancing and appear to be patent.   Skull and upper cervical spine: Craniotomy. Negative visible cervical spine and spinal cord. Visualized bone marrow signal is within normal limits.   Sinuses/Orbits: Stable and negative.   Other: Mastoids are clear. Visible internal auditory structures appear normal. Postoperative changes to the left scalp.   IMPRESSION: 1. New left side craniotomy and gross total resection of left temporal/operculum metastasis. Mild resection margin ischemia is possible. Stable regional edema and mass effect. Mild smooth left hemisphere meningeal thickening is likely postoperative in nature.   2. The other three smaller brain metastases are stable, aside from mildly increased associated brainstem and left cerebellar peduncle edema.   3. No new brain metastasis identified.     EXAM: 12/27/2021 MRI BRAIN W WO CONTRAST  FINDINGS: Brain: There are 4 unchanged intraparenchymal masses:   1. Left temporal lobe, 3.0 cm series 11, image 85 2. Left frontal lobe 3 mm, image 79 3. Paramedian right frontal lobe 8 mm, image 75 4. Left middle cerebellar peduncle,  9 mm, image 41.   Edema surrounding the left temporal lesion is unchanged with regional mass effect but no midline shift. Evidence of chronic hemorrhage within the left temporal and left cerebellar peduncular lesion. No  new lesions. No acute infarct. No extra-axial collection.   Vascular: Normal flow voids.   Skull and upper cervical spine: Normal marrow signal.   Sinuses/Orbits: Negative.   Other: None   IMPRESSION: Unchanged size and appearance of 4 intraparenchymal metastases. No new lesions.     I,Gabriella Ballesteros,acting as a scribe for Derwood Kaplan, MD.,have documented all relevant documentation on the behalf of Derwood Kaplan, MD,as directed by  Derwood Kaplan, MD while in the presence of Derwood Kaplan, MD.

## 2022-09-03 ENCOUNTER — Encounter: Payer: Self-pay | Admitting: Oncology

## 2022-09-03 DIAGNOSIS — S42025D Nondisplaced fracture of shaft of left clavicle, subsequent encounter for fracture with routine healing: Secondary | ICD-10-CM | POA: Diagnosis not present

## 2022-09-03 DIAGNOSIS — S42025A Nondisplaced fracture of shaft of left clavicle, initial encounter for closed fracture: Secondary | ICD-10-CM | POA: Diagnosis not present

## 2022-09-03 DIAGNOSIS — M25512 Pain in left shoulder: Secondary | ICD-10-CM | POA: Diagnosis not present

## 2022-09-04 ENCOUNTER — Inpatient Hospital Stay: Payer: Medicare Other | Admitting: Oncology

## 2022-09-04 ENCOUNTER — Telehealth: Payer: Self-pay | Admitting: *Deleted

## 2022-09-04 MED ORDER — DEXAMETHASONE 1 MG PO TABS: 3.0000 mg | ORAL_TABLET | Freq: Every day | ORAL | 0 refills | Status: DC

## 2022-09-04 NOTE — Addendum Note (Signed)
Addended by: Wilmon Arms on: 09/04/2022 09:44 AM   Modules accepted: Orders

## 2022-09-04 NOTE — Telephone Encounter (Signed)
Communicated response to the patient and he requested 1 mg tablets so I placed the order per Dr Renda Rolls request for that.

## 2022-09-04 NOTE — Telephone Encounter (Signed)
Patient called and was taking Decadron '4mg'$  once daily up until his last infusion which Dr Anabel Bene decreased to 2 mg once daily.  He states that since the dose decrease he has noticed left sided facial numbness and loss of movement on the left side of his face.  He is asking for guidance on dosing regarding the return of the loss of movement and the new sensation of numbness.   He also was concerned because he was told it would be helpful in determining if the infusion was causing the swelling but the infusion and the dose decrease day were the same so he doesn't know if that provided much helpful information.  Patient is on nivolumab.  Routed to Dr Mickeal Skinner to please advise.

## 2022-09-05 ENCOUNTER — Ambulatory Visit: Payer: 59 | Admitting: Oncology

## 2022-09-09 ENCOUNTER — Encounter: Payer: Self-pay | Admitting: Oncology

## 2022-09-15 ENCOUNTER — Encounter: Payer: Self-pay | Admitting: Oncology

## 2022-09-18 ENCOUNTER — Inpatient Hospital Stay: Payer: Medicare Other

## 2022-09-18 ENCOUNTER — Inpatient Hospital Stay: Payer: Medicare Other | Attending: Hematology and Oncology | Admitting: Oncology

## 2022-09-18 ENCOUNTER — Encounter: Payer: Self-pay | Admitting: Oncology

## 2022-09-18 DIAGNOSIS — C439 Malignant melanoma of skin, unspecified: Secondary | ICD-10-CM

## 2022-09-18 DIAGNOSIS — C4361 Malignant melanoma of right upper limb, including shoulder: Secondary | ICD-10-CM | POA: Diagnosis not present

## 2022-09-18 DIAGNOSIS — Z79620 Long term (current) use of immunosuppressive biologic: Secondary | ICD-10-CM | POA: Diagnosis not present

## 2022-09-18 DIAGNOSIS — Z5112 Encounter for antineoplastic immunotherapy: Secondary | ICD-10-CM | POA: Diagnosis not present

## 2022-09-18 DIAGNOSIS — C787 Secondary malignant neoplasm of liver and intrahepatic bile duct: Secondary | ICD-10-CM | POA: Diagnosis not present

## 2022-09-18 DIAGNOSIS — C7800 Secondary malignant neoplasm of unspecified lung: Secondary | ICD-10-CM

## 2022-09-18 DIAGNOSIS — C7931 Secondary malignant neoplasm of brain: Secondary | ICD-10-CM | POA: Diagnosis not present

## 2022-09-18 LAB — CMP (CANCER CENTER ONLY)
ALT: 19 U/L (ref 0–44)
AST: 17 U/L (ref 15–41)
Albumin: 4 g/dL (ref 3.5–5.0)
Alkaline Phosphatase: 66 U/L (ref 38–126)
Anion gap: 9 (ref 5–15)
BUN: 20 mg/dL (ref 6–20)
CO2: 23 mmol/L (ref 22–32)
Calcium: 9.2 mg/dL (ref 8.9–10.3)
Chloride: 106 mmol/L (ref 98–111)
Creatinine: 0.85 mg/dL (ref 0.61–1.24)
GFR, Estimated: >60 mL/min (ref >60)
Glucose, Bld: 198 mg/dL — ABNORMAL HIGH (ref 70–99)
Potassium: 3.8 mmol/L (ref 3.5–5.1)
Sodium: 138 mmol/L (ref 135–145)
Total Bilirubin: 0.6 mg/dL (ref 0.3–1.2)
Total Protein: 6.8 g/dL (ref 6.5–8.1)

## 2022-09-18 LAB — CBC WITH DIFFERENTIAL (CANCER CENTER ONLY)
Abs Immature Granulocytes: 0.05 10*3/uL (ref 0.00–0.07)
Basophils Absolute: 0.1 10*3/uL (ref 0.0–0.1)
Basophils Relative: 1 %
Eosinophils Absolute: 0 10*3/uL (ref 0.0–0.5)
Eosinophils Relative: 0 %
HCT: 40.6 % (ref 39.0–52.0)
Hemoglobin: 13.7 g/dL (ref 13.0–17.0)
Immature Granulocytes: 1 %
Lymphocytes Relative: 13 %
Lymphs Abs: 0.9 10*3/uL (ref 0.7–4.0)
MCH: 32.2 pg (ref 26.0–34.0)
MCHC: 33.7 g/dL (ref 30.0–36.0)
MCV: 95.3 fL (ref 80.0–100.0)
Monocytes Absolute: 0.4 10*3/uL (ref 0.1–1.0)
Monocytes Relative: 5 %
Neutro Abs: 5.2 10*3/uL (ref 1.7–7.7)
Neutrophils Relative %: 80 %
Platelet Count: 364 10*3/uL (ref 150–400)
RBC: 4.26 MIL/uL (ref 4.22–5.81)
RDW: 12.2 % (ref 11.5–15.5)
WBC Count: 6.5 10*3/uL (ref 4.0–10.5)
nRBC: 0 % (ref 0.0–0.2)

## 2022-09-18 LAB — TSH: TSH: 0.757 u[IU]/mL (ref 0.350–4.500)

## 2022-09-18 NOTE — Progress Notes (Signed)
Patient Care Team: [name], NP as PCP - General (Nurse Practitioner) Dellia Beckwith, MD as Consulting Physician (Oncology) Lance Bosch, MD as Consulting Physician (Radiation Oncology)  Clinic Day: 09/18/22  Referring physician: [name], NP  ASSESSMENT & PLAN:   Assessment & Plan: Melanoma of skin (HCC) Stage IIB malignant melanoma of the right posterior shoulder, Breslow's depth 3.3 mm, Clark level IV, treated with wide local excision, diagnosed in May 2021.  There was still a question as to whether this represented a metastatic lesion, rather than a primary, based on the presence of 2 nodules and no epidermal involvement.  MRI head and CT chest, abdomen and pelvis in June were negative for metastatic disease.   No adjuvant therapy was recommended.  PET scan in September was negative for metastatic disease at that time. Chest x-ray in June 2022 was negative.   Metastasis to subcutaneous tissue  He presented with a new area of concern to the left mid back in June of this year. A superficial nodule was noted approximately 1 cm to the right of his upper thoracic spine,and it had increased in size. He was sent for biopsy and that was performed on June 13 and confirmed metastatic malignant melanoma.   He has several hypermetabolic subcutaneous nodules seen on PET scan.  Metastasis to liver He has a small hypermetabolic liver lesion which is new and less than 1 cm but suspicious for metastasis.  Metastasis to lymph nodes He has multiple hypermetabolic nodes of the retroperitoneal and right pelvic area as well as right external iliac and a few additional nodes.  Pulmonary metastases He has bilateral hypermetabolic pulmonary nodules which are increased in size compared with the CT chest performed in May of this year.  The largest nodule is up to 2.3 cm in diameter.  Metastases to brain This was suspicious on the PET scan in the left frontoparietal lobe and so he had an  MRI of the brain.  This confirmed a 2.6 cm heterogeneous lesion of the left superior temporal gyrus with mild enhancement and extensive surrounding edema.  He also has an 8 mm cortical lesion of the inferomedial right frontal lobe with mild edema, a 2 mm mildly enhancing cortical lesion of the inferolateral left frontal lobe and a intrinsically T1 hyperintense lesion of the left lateral pons measuring 7 mm with surrounding edema.  He has 4 lesions in total but 3 are less than 1 cm.  He has received stereotactic radiation. He had neurologic symptoms in September and was placed on dexamethasone. Scans did show some cerebral edema so he was slowly tapered off.  He saw Dr. Barbaraann Cao with a repeat brain MRI in December, and they plan the next one on March 21..   Family history of breast cancer Maternal family history of breast cancer in addition to his melanoma history which raises suspicion for possible BRCA mutation.  He met with the genetic counselors and [facility] RNA + hereditary cancer panel revealed variants of uncertain significance (VUS) of the APC, CDKN1C and FH genes.  Skin Rash The actual skin rash is minimal and mainly involving his scalp, but he has severe pruritus. He is currently on a lose dose Prednisone 10mg  daily   Plan Dr.Vaslow haws scheduled an MRI of the brain for next week. He will continue with treatment on Tuesday, and we will plan to see him back in 4 weeks with CBC and CMP for repeat evaluation before his next maintenance nivolumab. We will plan  repeat CT scans, but we will postpone those since he has had some interruptions in his therapy.  His continues taking  dexamethasone 4 mg daily.. The patient understands the plans discussed today and is in agreement with them.  I have answered his questions and reviewed this information in detail.The patient knows to contact our office if he develops concerns prior to his next appointment.   Dellia Beckwith, MD  [facility] [address] [place] [address] Dept: [phone] Dept Fax: [phone]   No orders of the defined types were placed in this encounter.     CHIEF COMPLAINT:  CC: Metastatic melanoma  Current Treatment: Corticosteroids and brain irradiation, followed by immunotherapy  INTERVAL HISTORY:  Xavier Jones is here today for repeat clinical assessment of his Metastatic melanoma. He notes that the issue with his face has worsened. He also states the medication did not help/show any improvement. He is taking dexamethasone 4 mg daily. Dr.Vaslow haws scheduled an MRI of the brain for next week. He also notes he has pain in the left anterior clavicle. His appetite is good. His weight has increased about 4 pounds in the last month.  He denies fevers or chills.  He denies any cardiorespiratory or gastrointestinal symptoms.  I have reviewed the past medical history, past surgical history, social history and family history with the patient and they are unchanged from previous note.   ALLERGIES:  is allergic to sulfa antibiotics and testosterone.  MEDICATIONS:  Current Outpatient Medications  Medication Sig Dispense Refill   acyclovir (ZOVIRAX) 200 MG capsule Take by mouth.     acebutolol (SECTRAL) 200 MG capsule Take 1 capsule (200 mg total) by mouth daily. 90 capsule 3   amitriptyline (ELAVIL) 25 MG tablet Take 50 mg by mouth at bedtime.     dexamethasone (DECADRON) 4 MG tablet Take 1 tablet (4 mg total) by mouth 3 (three) times daily. 90 tablet 1   ergocalciferol (VITAMIN D2) 1.25 MG (50000 UT) capsule Take 50,000 Units by mouth every Wednesday.     HYDROcodone-acetaminophen (NORCO/VICODIN) 5-325 MG tablet Take by mouth.     hydrOXYzine (ATARAX) 25 MG tablet Take 1 tablet (25 mg total) by mouth 3 (three) times daily as needed for itching. 60 tablet 3   lidocaine-prilocaine (EMLA) cream Apply 1 Application topically as needed. 30 g 0    pentoxifylline (TRENTAL) 400 MG CR tablet Take 1 tablet (400 mg total) by mouth in the morning and at bedtime. 60 tablet 3   rivaroxaban (XARELTO) 20 MG TABS tablet Take 1 tablet (20 mg total) by mouth daily with supper. Start after completing 3 weeks of Xarelto 15 mg twice daily 30 tablet 3   triamcinolone (KENALOG) 0.025 % cream Apply 1 Application topically 2 (two) times daily. Apply to affected areas twice daily as needed 80 g 2   vitamin E 180 MG (400 UNITS) capsule Take 1 capsule (400 Units total) by mouth daily. 30 capsule 2   No current facility-administered medications for this visit.    HISTORY OF PRESENT ILLNESS:   Oncology History  Melanoma of skin  12/01/2019 Initial Diagnosis   Melanoma of skin (HCC)   12/05/2019 Cancer Staging   Staging form: Melanoma of the Skin, AJCC 8th Edition - Clinical stage from 12/05/2019: Stage IIA (cT3a, cN0, cM0) - Signed by Dellia Beckwith, MD on 05/30/2020   02/18/2021 Genetic Testing   No pathogenic variants detected in [facility] Multi-Cancer +  RNA Panel.  Variants of uncertain significance detected in APC (c.681C>G (p.Asp227Glu)), CDKN1C (c.610C>G (p.Pro204Ala)), and FH (c.1151C>T (p.Ala384Val)).  The report date is February 18, 2021.    The Multi-Cancer + RNA Panel offered by [facility] includes sequencing and/or deletion/duplication analysis of the following 84 genes:  AIP*, ALK, APC*, ATM*, AXIN2*, BAP1*, BARD1*, BLM*, BMPR1A*, BRCA1*, BRCA2*, BRIP1*, CASR, CDC73*, CDH1*, CDK4, CDKN1B*, CDKN1C*, CDKN2A, CEBPA, CHEK2*, CTNNA1*, DICER1*, DIS3L2*, EGFR, EPCAM, FH*, FLCN*, GATA2*, GPC3, GREM1, HOXB13, HRAS, KIT, MAX*, MEN1*, MET, MITF, MLH1*, MSH2*, MSH3*, MSH6*, MUTYH*, NBN*, NF1*, NF2*, NTHL1*, PALB2*, PDGFRA, PHOX2B, PMS2*, POLD1*, POLE*, POT1*, PRKAR1A*, PTCH1*, PTEN*, RAD50*, RAD51C*, RAD51D*, RB1*, RECQL4, RET, RUNX1*, SDHA*, SDHAF2*, SDHB*, SDHC*, SDHD*, SMAD4*, SMARCA4*, SMARCB1*, SMARCE1*, STK11*, SUFU*, TERC, TERT, TMEM127*, Tp53*, TSC1*, TSC2*,  VHL*, WRN*, and WT1.  RNA analysis is performed for * genes.   01/30/2022 - 02/20/2022 Chemotherapy   Patient is on Treatment Plan : MELANOMA Nivolumab + Ipilimumab (1/3) q21d / Nivolumab q28d     01/30/2022 -  Chemotherapy   Patient is on Treatment Plan : MELANOMA Nivolumab (480) q28d     Malignant melanoma metastatic to brain  12/19/2021 Initial Diagnosis   Malignant melanoma metastatic to brain (HCC)   01/30/2022 - 02/20/2022 Chemotherapy   Patient is on Treatment Plan : MELANOMA Nivolumab + Ipilimumab (1/3) q21d / Nivolumab q28d     01/30/2022 -  Chemotherapy   Patient is on Treatment Plan : MELANOMA Nivolumab (480) q28d     Malignant melanoma metastatic to lung  12/19/2021 Initial Diagnosis   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 Small nonocclusive segmental level pulmonary embolism in the  inferior LEFT upper lobe.  2. Marked bronchial wall thickening with areas of patchy basilar  opacities and new ground-glass opacities in the upper lobes.  Constellation of findings may be infectious bronchiolitis/bronchitis  though the possibility of drug related changes in the setting of  immunotherapy are also considered. Furthermore, given basilar  predominance and some material in basilar bronchial structures would  correlate with any risk factors for or history of aspiration.  3. Response to therapy of bilateral metastatic lesions. Some lymph  nodes in the chest with interval increase in size are equivocal at  this time, potentially reactive, but warrant attention on subsequent  imaging.    07/22/2022 Imaging   CT chest, abdomen and pelvis:  IMPRESSION:  1. Interval positive response to therapy. Resolved  subcarinal and  bilateral hilar lymphadenopathy. Stable to decreased left pulmonary  nodules. No new or progressive metastatic disease in the chest,  abdomen or pelvis.  2. One vessel coronary atherosclerosis.  3. Aortic Atherosclerosis (ICD10-I70.0).    Malignant neoplasm metastatic to liver  12/19/2021 Initial Diagnosis   Malignant neoplasm metastatic to liver [facility])   01/30/2022 - 02/20/2022 Chemotherapy   Patient is on Treatment Plan : MELANOMA Nivolumab + Ipilimumab (1/3) q21d / Nivolumab q28d     01/30/2022 -  Chemotherapy   Patient is on Treatment Plan : MELANOMA Nivolumab (480) q28d     07/22/2022 Imaging   CT chest, abdomen and pelvis:  IMPRESSION:  1. Interval positive response to therapy. Resolved subcarinal and  bilateral hilar lymphadenopathy. Stable  to decreased left pulmonary  nodules. No new or progressive metastatic disease in the chest,  abdomen or pelvis.  2. One vessel coronary atherosclerosis.  3. Aortic Atherosclerosis (ICD10-I70.0).        REVIEW OF SYSTEMS:   Review of Systems  Constitutional:  Negative for appetite change, chills, fever and unexpected weight change.  HENT:  Negative.  Negative for lump/mass, mouth sores and sore throat.   Eyes: Negative.        States his vision are blurry  Respiratory: Negative.  Negative for chest tightness, cough, hemoptysis, shortness of breath and wheezing.   Cardiovascular: Negative.  Negative for chest pain, leg swelling and palpitations.  Gastrointestinal:  Positive for nausea. Negative for abdominal distention, abdominal pain, blood in stool, constipation, diarrhea and vomiting.  Endocrine: Negative.  Negative for hot flashes.  Genitourinary: Negative.  Negative for difficulty urinating, dysuria, frequency and hematuria.   Musculoskeletal:  Negative for arthralgias, back pain, flank pain and gait problem.  Skin:  Positive for itching (tolderable with treatment).  Neurological:  Positive for headaches (comes in  waves). Negative for dizziness, extremity weakness, gait problem, light-headedness, numbness, seizures and speech difficulty.  Hematological: Negative.  Negative for adenopathy. Does not bruise/bleed easily.  Psychiatric/Behavioral: Negative.  Negative for depression and sleep disturbance. The patient is not nervous/anxious.    Marland Kitchen   VITALS:  Blood pressure 120/84, pulse 96, temperature 98.2 F (36.8 C), temperature source Oral, resp. rate 18, SpO2 100 %.  Wt Readings from Last 3 Encounters:  09/29/22 167 lb 3.2 oz (75.8 kg)  09/23/22 168 lb (76.2 kg)  08/25/22 171 lb (77.6 kg)    There is no height or weight on file to calculate BMI.  Performance status (ECOG): 1 - Symptomatic but completely ambulatory  PHYSICAL EXAM:    Physical Exam Constitutional:      General: He is not in acute distress.    Appearance: Normal appearance. He is normal weight. He is not ill-appearing or toxic-appearing.  HENT:     Head:     Comments: Swelling and tenderness of the left anterior clavicle.  Severe droop of the left face    Right Ear: Tympanic membrane normal. There is no impacted cerumen.     Left Ear: There is no impacted cerumen.     Nose: Nose normal.  Eyes:     General: No scleral icterus.    Extraocular Movements: Extraocular movements intact.     Conjunctiva/sclera: Conjunctivae normal.     Pupils: Pupils are equal, round, and reactive to light.  Cardiovascular:     Rate and Rhythm: Normal rate.     Pulses: Normal pulses.     Heart sounds: Normal heart sounds. No murmur heard.    No gallop.  Pulmonary:     Effort: Pulmonary effort is normal. No respiratory distress.     Breath sounds: Normal breath sounds. No wheezing or rales.  Abdominal:     General: Bowel sounds are normal.  Musculoskeletal:        General: Normal range of motion.     Cervical back: Normal range of motion and neck supple.     Comments: Large deep scar in the right posterior shoulder that is well healed.    Skin:    General: Skin is warm.  Neurological:     Mental Status: He is alert and oriented to person, place, and time.  Psychiatric:        Mood and Affect: Mood normal.  Behavior: Behavior normal.        Thought Content: Thought content normal.        Judgment: Judgment normal.     GENERAL:alert, no distress and comfortable SKIN: skin color, texture, turgor are normal, no rashes or significant lesions.  He has an additional nodule of the left flank which is smaller but firm. EYES: normal, Conjunctiva are pink and non-injected, sclera clear OROPHARYNX:no exudate, no erythema and lips, buccal mucosa, and tongue normal  NECK: supple, thyroid normal size, non-tender, without nodularity LYMPH:  no palpable lymphadenopathy in the cervical, axillary or inguinal LUNGS: clear to auscultation and percussion with normal breathing effort Occasional wheeze all lung feels except upper lobe HEART: regular rate & rhythm with tachycardia, and no lower extremity edema ABDOMEN:abdomen soft, non-tender and normal bowel sounds Musculoskeletal:no cyanosis of digits and no clubbing  NEURO: alert & oriented x 3 with fluent speech, no focal motor/sensory deficits Port colon right upper chest maculopapular rash surrounding superior portion. Looks like allergic rash  Large scars bilateral lower back, posteriorly. Mass in upper thoracic posterior area measuring 4 x 4 cm ,  firm   LABORATORY DATA:  I have reviewed the data as listed    Component Value Date/Time   NA 138 09/18/2022 1259   NA 138 07/28/2022 0000   K 3.8 09/18/2022 1259   CL 106 09/18/2022 1259   CO2 23 09/18/2022 1259   GLUCOSE 198 (H) 09/18/2022 1259   BUN 20 09/18/2022 1259   BUN 13 07/28/2022 0000   CREATININE 0.85 09/18/2022 1259   CALCIUM 9.2 09/18/2022 1259   PROT 6.8 09/18/2022 1259   PROT 6.2 (A) 05/08/2022 0000   ALBUMIN 4.0 09/18/2022 1259   AST 17 09/18/2022 1259   ALT 19 09/18/2022 1259   ALKPHOS 66 09/18/2022  1259   BILITOT 0.6 09/18/2022 1259   GFRNONAA >60 09/18/2022 1259    No results found for: "SPEP", "UPEP"  Lab Results  Component Value Date   WBC 6.5 09/18/2022   NEUTROABS 5.2 09/18/2022   HGB 13.7 09/18/2022   HCT 40.6 09/18/2022   MCV 95.3 09/18/2022   PLT 364 09/18/2022      Chemistry      Component Value Date/Time   NA 138 09/18/2022 1259   NA 138 07/28/2022 0000   K 3.8 09/18/2022 1259   CL 106 09/18/2022 1259   CO2 23 09/18/2022 1259   BUN 20 09/18/2022 1259   BUN 13 07/28/2022 0000   CREATININE 0.85 09/18/2022 1259   GLU 93 07/28/2022 0000      Component Value Date/Time   CALCIUM 9.2 09/18/2022 1259   ALKPHOS 66 09/18/2022 1259   AST 17 09/18/2022 1259   ALT 19 09/18/2022 1259   BILITOT 0.6 09/18/2022 1259       RADIOGRAPHIC STUDIES: I have personally reviewed the radiological images as listed and agreed with the findings in the report. MR BRAIN W WO CONTRAST  Result Date: 09/29/2022 CLINICAL DATA:  Brain/CNS neoplasm, assess treatment response EXAM: MRI HEAD WITHOUT AND WITH CONTRAST TECHNIQUE: Multiplanar, multiecho pulse sequences of the brain and surrounding structures were obtained without and with intravenous contrast. CONTRAST:  7mL GADAVIST GADOBUTROL 1 MMOL/ML IV SOLN COMPARISON:  MRI head 06/18/2022. FINDINGS: Brain: Increased size of a peripherally enhancing and centrally T1 hyperintense mass in the left pons and middle cerebellar peduncle which now measures 1.8 x 1.6 x 1.5 cm. Increased surrounding edema. Similar versus slightly decreased size of a 13 x 10 mm  enhancing lesion in the medial right frontal lobe. Similar surrounding edema. Mildly diminished intrinsic T1 hyperintensity of the left temporal resection site with linear enhancement along the lateral margin, probably postsurgical. Surrounding T2/FLAIR hyperintensities not substantially changed. No evidence of acute hemorrhage, midline shift or hydrocephalus. Vascular: Major arterial flow voids  are maintained at the skull base. Skull and upper cervical spine: Normal marrow signal. Sinuses/Orbits: Clear sinuses.  No acute orbital findings. Other: No mastoid effusions. IMPRESSION: 1. Increased size of a hemorrhagic left pons/middle cerebellar peduncle lesion with increased surrounding edema, described above. 2. Similar versus slightly decreased size of a 13 x 10 mm enhancing lesion in the medial right frontal lobe. 3. Mildly diminished intrinsic T1 hyperintensity of the left temporal resection site with linear enhancement along the lateral margin, probably postsurgical. Recommend continued attention on follow-up. Electronically Signed   By: Feliberto Harts M.D.   On: 09/29/2022 08:50   EXAM : 01/04/2022 MRI BRAIN W WO CONTRAST  FINDINGS: Brain: New left side craniotomy. Smooth new widespread left hemisphere pachymeningeal thickening is probably postoperative in nature (series 11, image 17). Posterior left temporal/operculum resection cavity with heterogeneous internal contents, both T1 intrinsic and isointense. Some evidence of marginal diffusion restriction (series 2, image 27). No definite enhancement following contrast. Regional vasogenic edema and mass effect are mildly reduced.   T1 intrinsic left brainstem metastasis near the 7th/8th cranial nerve root entry zone is stable at 10 mm. Regional brainstem and left cerebellar peduncle edema does appear mildly increased on series 6, image 9.   Right inferior frontal gyrus T1 hypointense and enhancing metastasis is stable measuring 10 mm (series 10, image 41). Stable mild regional edema.   Nearby small contralateral left inferior frontal gyrus metastasis measuring 3-4 mm is stable on series 10, image 41, no edema.   No new brain metastasis identified.   Stable ventricle size and configuration. No other restricted diffusion. Cervicomedullary junction and pituitary are within normal limits. Normal basilar cisterns. Occasional small  nonspecific cerebral white matter T2 and FLAIR hyperintense foci are stable.   Vascular: Major intracranial vascular flow voids are stable. The major dural venous sinuses are enhancing and appear to be patent.   Skull and upper cervical spine: Craniotomy. Negative visible cervical spine and spinal cord. Visualized bone marrow signal is within normal limits.   Sinuses/Orbits: Stable and negative.   Other: Mastoids are clear. Visible internal auditory structures appear normal. Postoperative changes to the left scalp.   IMPRESSION: 1. New left side craniotomy and gross total resection of left temporal/operculum metastasis. Mild resection margin ischemia is possible. Stable regional edema and mass effect. Mild smooth left hemisphere meningeal thickening is likely postoperative in nature.   2. The other three smaller brain metastases are stable, aside from mildly increased associated brainstem and left cerebellar peduncle edema.   3. No new brain metastasis identified.     EXAM: 12/27/2021 MRI BRAIN W WO CONTRAST  FINDINGS: Brain: There are 4 unchanged intraparenchymal masses:   1. Left temporal lobe, 3.0 cm series 11, image 85 2. Left frontal lobe 3 mm, image 79 3. Paramedian right frontal lobe 8 mm, image 75 4. Left middle cerebellar peduncle, 9 mm, image 41.   Edema surrounding the left temporal lesion is unchanged with regional mass effect but no midline shift. Evidence of chronic hemorrhage within the left temporal and left cerebellar peduncular lesion. No new lesions. No acute infarct. No extra-axial collection.   Vascular: Normal flow voids.   Skull and upper cervical  spine: Normal marrow signal.   Sinuses/Orbits: Negative.   Other: None   IMPRESSION: Unchanged size and appearance of 4 intraparenchymal metastases. No new lesions.     I,Gabriella Ballesteros,acting as a scribe for Dellia Beckwith, MD.,have documented all relevant documentation on the  behalf of Dellia Beckwith, MD,as directed by  Dellia Beckwith, MD while in the presence of Dellia Beckwith, MD.

## 2022-09-20 LAB — T4: T4, Total: 8.3 ug/dL (ref 4.5–12.0)

## 2022-09-22 ENCOUNTER — Encounter: Payer: Self-pay | Admitting: Oncology

## 2022-09-23 ENCOUNTER — Inpatient Hospital Stay: Payer: Medicare Other

## 2022-09-23 VITALS — BP 128/83 | HR 75 | Temp 97.9°F | Resp 16 | Ht 69.0 in | Wt 168.0 lb

## 2022-09-23 DIAGNOSIS — C7931 Secondary malignant neoplasm of brain: Secondary | ICD-10-CM

## 2022-09-23 DIAGNOSIS — C7800 Secondary malignant neoplasm of unspecified lung: Secondary | ICD-10-CM | POA: Diagnosis not present

## 2022-09-23 DIAGNOSIS — Z5112 Encounter for antineoplastic immunotherapy: Secondary | ICD-10-CM | POA: Diagnosis not present

## 2022-09-23 DIAGNOSIS — C4361 Malignant melanoma of right upper limb, including shoulder: Secondary | ICD-10-CM | POA: Diagnosis not present

## 2022-09-23 DIAGNOSIS — C787 Secondary malignant neoplasm of liver and intrahepatic bile duct: Secondary | ICD-10-CM | POA: Diagnosis not present

## 2022-09-23 DIAGNOSIS — C439 Malignant melanoma of skin, unspecified: Secondary | ICD-10-CM

## 2022-09-23 DIAGNOSIS — Z79620 Long term (current) use of immunosuppressive biologic: Secondary | ICD-10-CM | POA: Diagnosis not present

## 2022-09-23 MED ORDER — SODIUM CHLORIDE 0.9% FLUSH
10.0000 mL | INTRAVENOUS | Status: DC | PRN
  Administered 2022-09-23: 10 mL

## 2022-09-23 MED ORDER — SODIUM CHLORIDE 0.9 % IV SOLN
480.0000 mg | Freq: Once | INTRAVENOUS | Status: AC
  Administered 2022-09-23: 480 mg via INTRAVENOUS
  Filled 2022-09-23: qty 48

## 2022-09-23 MED ORDER — SODIUM CHLORIDE 0.9 % IV SOLN: Freq: Once | INTRAVENOUS | Status: AC

## 2022-09-23 MED ORDER — HEPARIN SOD (PORK) LOCK FLUSH 100 UNIT/ML IV SOLN
500.0000 [IU] | Freq: Once | INTRAVENOUS | Status: AC | PRN
  Administered 2022-09-23: 500 [IU]

## 2022-09-23 NOTE — Patient Instructions (Signed)
Nivolumab Injection What is this medication? NIVOLUMAB (nye VOL ue mab) treats some types of cancer. It works by helping your immune system slow or stop the spread of cancer cells. It is a monoclonal antibody. This medicine may be used for other purposes; ask your health care provider or pharmacist if you have questions. COMMON BRAND NAME(S): Opdivo What should I tell my care team before I take this medication? They need to know if you have any of these conditions: Allogeneic stem cell transplant (uses someone else's stem cells) Autoimmune diseases, such as Crohn disease, ulcerative colitis, lupus History of chest radiation Nervous system problems, such as Guillain-Barre syndrome or myasthenia gravis Organ transplant An unusual or allergic reaction to nivolumab, other medications, foods, dyes, or preservatives Pregnant or trying to get pregnant Breast-feeding How should I use this medication? This medication is infused into a vein. It is given in a hospital or clinic setting. A special MedGuide will be given to you before each treatment. Be sure to read this information carefully each time. Talk to your care team about the use of this medication in children. While it may be prescribed for children as young as 12 years for selected conditions, precautions do apply. Overdosage: If you think you have taken too much of this medicine contact a poison control center or emergency room at once. NOTE: This medicine is only for you. Do not share this medicine with others. What if I miss a dose? Keep appointments for follow-up doses. It is important not to miss your dose. Call your care team if you are unable to keep an appointment. What may interact with this medication? Interactions have not been studied. This list may not describe all possible interactions. Give your health care provider a list of all the medicines, herbs, non-prescription drugs, or dietary supplements you use. Also tell them if you  smoke, drink alcohol, or use illegal drugs. Some items may interact with your medicine. What should I watch for while using this medication? Your condition will be monitored carefully while you are receiving this medication. You may need blood work while taking this medication. This medication may cause serious skin reactions. They can happen weeks to months after starting the medication. Contact your care team right away if you notice fevers or flu-like symptoms with a rash. The rash may be red or purple and then turn into blisters or peeling of the skin. You may also notice a red rash with swelling of the face, lips, or lymph nodes in your neck or under your arms. Tell your care team right away if you have any change in your eyesight. Talk to your care team if you are pregnant or think you might be pregnant. A negative pregnancy test is required before starting this medication. A reliable form of contraception is recommended while taking this medication and for 5 months after the last dose. Talk to your care team about effective forms of contraception. Do not breast-feed while taking this medication and for 5 months after the last dose. What side effects may I notice from receiving this medication? Side effects that you should report to your care team as soon as possible: Allergic reactions--skin rash, itching, hives, swelling of the face, lips, tongue, or throat Dry cough, shortness of breath or trouble breathing Eye pain, redness, irritation, or discharge with blurry or decreased vision Heart muscle inflammation--unusual weakness or fatigue, shortness of breath, chest pain, fast or irregular heartbeat, dizziness, swelling of the ankles, feet, or hands Hormone   gland problems--headache, sensitivity to light, unusual weakness or fatigue, dizziness, fast or irregular heartbeat, increased sensitivity to cold or heat, excessive sweating, constipation, hair loss, increased thirst or amount of urine,  tremors or shaking, irritability Infusion reactions--chest pain, shortness of breath or trouble breathing, feeling faint or lightheaded Kidney injury (glomerulonephritis)--decrease in the amount of urine, red or dark brown urine, foamy or bubbly urine, swelling of the ankles, hands, or feet Liver injury--right upper belly pain, loss of appetite, nausea, light-colored stool, dark yellow or brown urine, yellowing skin or eyes, unusual weakness or fatigue Pain, tingling, or numbness in the hands or feet, muscle weakness, change in vision, confusion or trouble speaking, loss of balance or coordination, trouble walking, seizures Rash, fever, and swollen lymph nodes Redness, blistering, peeling, or loosening of the skin, including inside the mouth Sudden or severe stomach pain, bloody diarrhea, fever, nausea, vomiting Side effects that usually do not require medical attention (report these to your care team if they continue or are bothersome): Bone, joint, or muscle pain Diarrhea Fatigue Loss of appetite Nausea Skin rash This list may not describe all possible side effects. Call your doctor for medical advice about side effects. You may report side effects to FDA at 1-800-FDA-1088. Where should I keep my medication? This medication is given in a hospital or clinic. It will not be stored at home. NOTE: This sheet is a summary. It may not cover all possible information. If you have questions about this medicine, talk to your doctor, pharmacist, or health care provider.  2023 Elsevier/Gold Standard (2014-05-15 00:00:00)  

## 2022-09-24 ENCOUNTER — Encounter: Payer: Self-pay | Admitting: Oncology

## 2022-09-25 ENCOUNTER — Ambulatory Visit (HOSPITAL_COMMUNITY)
Admission: RE | Admit: 2022-09-25 | Discharge: 2022-09-25 | Disposition: A | Discharge status: Home / Self Care | Payer: Medicare Other | Source: Ambulatory Visit | Attending: Internal Medicine | Admitting: Internal Medicine

## 2022-09-25 DIAGNOSIS — C7931 Secondary malignant neoplasm of brain: Secondary | ICD-10-CM | POA: Diagnosis not present

## 2022-09-25 DIAGNOSIS — G9389 Other specified disorders of brain: Secondary | ICD-10-CM | POA: Diagnosis not present

## 2022-09-25 MED ORDER — GADOBUTROL 1 MMOL/ML IV SOLN
7.0000 mL | Freq: Once | INTRAVENOUS | Status: AC | PRN
  Administered 2022-09-25: 7 mL via INTRAVENOUS

## 2022-09-29 ENCOUNTER — Inpatient Hospital Stay: Payer: Medicare Other

## 2022-09-29 ENCOUNTER — Inpatient Hospital Stay: Payer: Medicare Other | Attending: Internal Medicine | Admitting: Internal Medicine

## 2022-09-29 ENCOUNTER — Other Ambulatory Visit: Payer: Self-pay | Admitting: Radiation Therapy

## 2022-09-29 VITALS — BP 136/93 | HR 77 | Temp 97.7°F | Resp 20 | Wt 167.2 lb

## 2022-09-29 DIAGNOSIS — C7931 Secondary malignant neoplasm of brain: Secondary | ICD-10-CM

## 2022-09-29 DIAGNOSIS — C4361 Malignant melanoma of right upper limb, including shoulder: Secondary | ICD-10-CM | POA: Insufficient documentation

## 2022-09-29 DIAGNOSIS — C787 Secondary malignant neoplasm of liver and intrahepatic bile duct: Secondary | ICD-10-CM | POA: Diagnosis not present

## 2022-09-29 DIAGNOSIS — C7800 Secondary malignant neoplasm of unspecified lung: Secondary | ICD-10-CM | POA: Insufficient documentation

## 2022-09-29 MED ORDER — DEXAMETHASONE 4 MG PO TABS: 4.0000 mg | ORAL_TABLET | Freq: Three times a day (TID) | ORAL | 1 refills | Status: DC

## 2022-09-29 NOTE — Progress Notes (Signed)
[facility] [place], [place] [address] [phone]   Interval Evaluation  Date of Service: 09/29/22 Patient Name: Xavier Jones Patient MRN: [id number] Patient DOB: 12-06-1970 Provider: [name], MD  Identifying Statement:  Xavier Jones is a 52 y.o. male with Malignant melanoma metastatic to brain [facility])   Primary Cancer:  Oncologic History: Oncology History  Melanoma of skin ([place])  12/01/2019 Initial Diagnosis   Melanoma of skin ([place])   12/05/2019 Cancer Staging   Staging form: Melanoma of the Skin, AJCC 8th Edition - Clinical stage from 12/05/2019: Stage IIA (cT3a, cN0, cM0) - Signed by Derwood Kaplan, MD on 05/30/2020   02/18/2021 Genetic Testing   No pathogenic variants detected in [facility] Multi-Cancer +RNA Panel.  Variants of uncertain significance detected in APC (c.681C>G (p.Asp227Glu)), CDKN1C (c.610C>G (p.Pro204Ala)), and FH (c.1151C>T (p.Ala384Val)).  The report date is February 18, 2021.    The Multi-Cancer + RNA Panel offered by [facility] includes sequencing and/or deletion/duplication analysis of the following 84 genes:  AIP*, ALK, APC*, ATM*, AXIN2*, BAP1*, BARD1*, BLM*, BMPR1A*, BRCA1*, BRCA2*, BRIP1*, CASR, CDC73*, CDH1*, CDK4, CDKN1B*, CDKN1C*, CDKN2A, CEBPA, CHEK2*, CTNNA1*, DICER1*, DIS3L2*, EGFR, EPCAM, FH*, FLCN*, GATA2*, GPC3, GREM1, HOXB13, HRAS, KIT, MAX*, MEN1*, MET, MITF, MLH1*, MSH2*, MSH3*, MSH6*, MUTYH*, NBN*, NF1*, NF2*, NTHL1*, PALB2*, PDGFRA, PHOX2B, PMS2*, POLD1*, POLE*, POT1*, PRKAR1A*, PTCH1*, PTEN*, RAD50*, RAD51C*, RAD51D*, RB1*, RECQL4, RET, RUNX1*, SDHA*, SDHAF2*, SDHB*, SDHC*, SDHD*, SMAD4*, SMARCA4*, SMARCB1*, SMARCE1*, STK11*, SUFU*, TERC, TERT, TMEM127*, Tp53*, TSC1*, TSC2*, VHL*, WRN*, and WT1.  RNA analysis is performed for * genes.   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 ([place])  12/19/2021 Initial Diagnosis   Malignant melanoma metastatic to brain ([place])   01/30/2022 - 02/20/2022 Chemotherapy   Patient is on Treatment Plan : MELANOMA Nivolumab + Ipilimumab (1/3) q21d / Nivolumab q28d     01/30/2022 -  Chemotherapy   Patient is on Treatment Plan : MELANOMA Nivolumab (480) q28d     Malignant melanoma metastatic to lung ([place])  12/19/2021 Initial Diagnosis   Malignant melanoma metastatic to lung ([place])   01/30/2022 - 02/20/2022 Chemotherapy   Patient is on Treatment Plan : MELANOMA Nivolumab + Ipilimumab (1/3) q21d / Nivolumab q28d     01/30/2022 -  Chemotherapy   Patient is on Treatment Plan : MELANOMA Nivolumab (480) q28d     03/24/2022 Imaging   CTA chest:  IMPRESSION:  1. Small nonocclusive segmental level pulmonary embolism in the  inferior LEFT upper lobe.  2. Marked bronchial wall thickening with areas of patchy basilar  opacities and new ground-glass opacities in the upper lobes.  Constellation of findings may be infectious bronchiolitis/bronchitis  though the possibility of drug related changes in the setting of  immunotherapy are also considered. Furthermore, given basilar  predominance and some material in basilar bronchial structures would  correlate with any risk factors for or history of aspiration.  3. Response to therapy of bilateral metastatic lesions. Some lymph  nodes in the chest with interval increase in size are equivocal at  this time, potentially reactive, but warrant attention on subsequent  imaging.    07/22/2022 Imaging   CT chest, abdomen and pelvis:  IMPRESSION:  1.  Interval positive response to therapy. Resolved subcarinal and  bilateral hilar lymphadenopathy. Stable to decreased left pulmonary  nodules. No new or progressive metastatic disease in the chest,  abdomen or pelvis.  2. One  vessel coronary atherosclerosis.  3. Aortic Atherosclerosis (ICD10-I70.0).    Malignant neoplasm metastatic to liver ([place])  12/19/2021 Initial Diagnosis   Malignant neoplasm metastatic to liver ([place])   01/30/2022 - 02/20/2022 Chemotherapy   Patient is on Treatment Plan : MELANOMA Nivolumab + Ipilimumab (1/3) q21d / Nivolumab q28d     01/30/2022 -  Chemotherapy   Patient is on Treatment Plan : MELANOMA Nivolumab (480) q28d     07/22/2022 Imaging   CT chest, abdomen and pelvis:  IMPRESSION:  1. Interval positive response to therapy. Resolved subcarinal and  bilateral hilar lymphadenopathy. Stable to decreased left pulmonary  nodules. No new or progressive metastatic disease in the chest,  abdomen or pelvis.  2. One vessel coronary atherosclerosis.  3. Aortic Atherosclerosis (ICD10-I70.0).     CNS Oncologic History 01/02/22: SRS to 4 metastases, including pre-op L temporal Xavier Jones) 01/03/22: Craniotomy, resection L temporal Xavier Jones)  Interval History: Xavier Jones presents for follow up after recent MRI brain.  His left sided facial weakness is clearly worse.  He now has very little use of the left side, and is unable to close his eyelid.  He is using saline drops to keep the eye moist.  There are no new symptoms, however, and he maintains full use of right arm and leg.  Decadron dosing is currently at 4mg  daily.  Continues on nivolumab with Dr. Hinton Rao.  H+P (03/31/22) Patient presents today after 3 month post-SRS and craniotomy follow up.  He developed left sided weakness, mainly facial droop, 1 week ago.  Denies arm or leg weakness during that time.  He was started on decadron, dosed 4mg  TID x3 days, then down to 4mg  daily thereafter.  His weakness has improved back to near baseline.  He has no other complaints today, no seizures, no issues with gait.  Medications: Current Outpatient Medications on File Prior to Visit  Medication Sig Dispense Refill   acebutolol (SECTRAL) 200  MG capsule Take 1 capsule (200 mg total) by mouth daily. 90 capsule 3   acyclovir (ZOVIRAX) 200 MG capsule Take by mouth.     amitriptyline (ELAVIL) 25 MG tablet Take 50 mg by mouth at bedtime.     dexamethasone (DECADRON) 1 MG tablet Take 3 tablets (3 mg total) by mouth daily. 90 tablet 0   dexamethasone (DECADRON) 4 MG tablet Take 1 tablet (4 mg total) by mouth 2 (two) times daily. 60 tablet 1   ergocalciferol (VITAMIN D2) 1.25 MG (50000 UT) capsule Take 50,000 Units by mouth every Wednesday.     HYDROcodone-acetaminophen (NORCO/VICODIN) 5-325 MG tablet Take by mouth.     hydrOXYzine (ATARAX) 25 MG tablet Take 1 tablet (25 mg total) by mouth 3 (three) times daily as needed for itching. 60 tablet 3   lidocaine-prilocaine (EMLA) cream Apply 1 Application topically as needed. 30 g 0   pentoxifylline (TRENTAL) 400 MG CR tablet Take 1 tablet (400 mg total) by mouth in the morning and at bedtime. 60 tablet 3   rivaroxaban (XARELTO) 20 MG TABS tablet Take 1 tablet (20 mg total) by mouth daily with supper. Start after completing 3 weeks of Xarelto 15 mg twice daily 30 tablet 3   triamcinolone (KENALOG) 0.025 % cream Apply 1 Application topically 2 (two) times daily. Apply to affected  areas twice daily as needed 80 g 2   vitamin E 180 MG (400 UNITS) capsule Take 1 capsule (400 Units total) by mouth daily. 30 capsule 2   No current facility-administered medications on file prior to visit.    Allergies:  Allergies  Allergen Reactions   Sulfa Antibiotics Rash   Testosterone Itching    Gel   Past Medical History:  Past Medical History:  Diagnosis Date   Acute pulmonary embolism without acute cor pulmonale ([place]) 05/01/2022   Anemia 05/20/2022   Depression    mild   Family history of breast cancer 01/22/2021   Family history of prostate cancer 01/22/2021   Genital herpes    History of back injury 2003   Pruritic rash 02/18/2022   Skin cancer    melanoma   Past Surgical History:  Past  Surgical History:  Procedure Laterality Date   APPLICATION OF CRANIAL NAVIGATION N/A 01/03/2022   Procedure: APPLICATION OF CRANIAL NAVIGATION;  Surgeon: Consuella Lose, MD;  Location: [facility];  Service: Neurosurgery;  Laterality: N/A;   CRANIOTOMY Left 01/03/2022   Procedure: Stereotactic Left temporal craniotomy for resection of tumor;  Surgeon: Consuella Lose, MD;  Location: [facility];  Service: Neurosurgery;  Laterality: Left;   MELANOMA EXCISION WITH SENTINEL LYMPH NODE BIOPSY Right 2021   SPINAL FUSION W/ LUQUE UNIT ROD  2003   Social History:  Social History   Socioeconomic History   Marital status: Divorced    Spouse name: Not on file   Number of children: 0   Years of education: Not on file   Highest education level: Not on file  Occupational History   Not on file  Tobacco Use   Smoking status: Former    Types: Cigarettes    Quit date: 1998    Years since quitting: 26.2   Smokeless tobacco: Never  Vaping Use   Vaping Use: Never used  Substance and Sexual Activity   Alcohol use: Yes    Alcohol/week: 14.0 standard drinks of alcohol    Types: 14 Cans of beer per week    Comment: 2-3 beers per evening   Drug use: Not Currently   Sexual activity: Not on file  Other Topics Concern   Not on file  Social History Narrative   Not on file   Social Determinants of Health   Financial Resource Strain: Not on file  Food Insecurity: Not on file  Transportation Needs: Not on file  Physical Activity: Not on file  Stress: Not on file  Social Connections: Not on file  Intimate Partner Violence: Not on file   Family History:  Family History  Problem Relation Age of Onset   Prostate cancer Father        dx 9s   Breast cancer Maternal Aunt        dx after 33; bilateral mastectomy   Stomach cancer Maternal Grandfather        dx 56s   Breast cancer Cousin 23       maternal male cousin; mets    Review of Systems: Constitutional: Doesn't report fevers, chills or  abnormal weight loss Eyes: Doesn't report blurriness of vision Ears, nose, mouth, throat, and face: Doesn't report sore throat Respiratory: Doesn't report cough, dyspnea or wheezes Cardiovascular: Doesn't report palpitation, chest discomfort  Gastrointestinal:  Doesn't report nausea, constipation, diarrhea GU: Doesn't report incontinence Skin: Doesn't report skin rashes Neurological: Per HPI Musculoskeletal: Doesn't report joint pain Behavioral/Psych: Doesn't report anxiety  Physical Exam: Vitals:  09/29/22 1218  BP: (!) 136/93  Pulse: 77  Resp: 20  Temp: 97.7 F (36.5 C)  SpO2: 99%   KPS: 80. General: Alert, cooperative, pleasant, in no acute distress Head: Normal EENT: No conjunctival injection or scleral icterus.  Lungs: Resp effort normal Cardiac: Regular rate Abdomen: Non-distended abdomen Skin: No rashes cyanosis or petechiae. Extremities: No clubbing or edema  Neurologic Exam: Mental Status: Awake, alert, attentive to examiner. Oriented to self and environment. Language is fluent with intact comprehension.  Cranial Nerves: Visual acuity is grossly normal. Visual fields are full. Extra-ocular movements intact. L LMN facial paresis, dense Motor: Tone and bulk are normal. Power is full in both arms and legs. Reflexes are symmetric, no pathologic reflexes present.  Sensory: Intact to light touch Gait: Normal.   Labs: I have reviewed the data as listed    Component Value Date/Time   NA 138 09/18/2022 1259   NA 138 07/28/2022 0000   K 3.8 09/18/2022 1259   CL 106 09/18/2022 1259   CO2 23 09/18/2022 1259   GLUCOSE 198 (H) 09/18/2022 1259   BUN 20 09/18/2022 1259   BUN 13 07/28/2022 0000   CREATININE 0.85 09/18/2022 1259   CALCIUM 9.2 09/18/2022 1259   PROT 6.8 09/18/2022 1259   PROT 6.2 (A) 05/08/2022 0000   ALBUMIN 4.0 09/18/2022 1259   AST 17 09/18/2022 1259   ALT 19 09/18/2022 1259   ALKPHOS 66 09/18/2022 1259   BILITOT 0.6 09/18/2022 1259   GFRNONAA  >60 09/18/2022 1259   Lab Results  Component Value Date   WBC 6.5 09/18/2022   NEUTROABS 5.2 09/18/2022   HGB 13.7 09/18/2022   HCT 40.6 09/18/2022   MCV 95.3 09/18/2022   PLT 364 09/18/2022    Imaging:  MR BRAIN W WO CONTRAST  Result Date: 09/29/2022 CLINICAL DATA:  Brain/CNS neoplasm, assess treatment response EXAM: MRI HEAD WITHOUT AND WITH CONTRAST TECHNIQUE: Multiplanar, multiecho pulse sequences of the brain and surrounding structures were obtained without and with intravenous contrast. CONTRAST:  25mL GADAVIST GADOBUTROL 1 MMOL/ML IV SOLN COMPARISON:  MRI head 06/18/2022. FINDINGS: Brain: Increased size of a peripherally enhancing and centrally T1 hyperintense mass in the left pons and middle cerebellar peduncle which now measures 1.8 x 1.6 x 1.5 cm. Increased surrounding edema. Similar versus slightly decreased size of a 13 x 10 mm enhancing lesion in the medial right frontal lobe. Similar surrounding edema. Mildly diminished intrinsic T1 hyperintensity of the left temporal resection site with linear enhancement along the lateral margin, probably postsurgical. Surrounding T2/FLAIR hyperintensities not substantially changed. No evidence of acute hemorrhage, midline shift or hydrocephalus. Vascular: Major arterial flow voids are maintained at the skull base. Skull and upper cervical spine: Normal marrow signal. Sinuses/Orbits: Clear sinuses.  No acute orbital findings. Other: No mastoid effusions. IMPRESSION: 1. Increased size of a hemorrhagic left pons/middle cerebellar peduncle lesion with increased surrounding edema, described above. 2. Similar versus slightly decreased size of a 13 x 10 mm enhancing lesion in the medial right frontal lobe. 3. Mildly diminished intrinsic T1 hyperintensity of the left temporal resection site with linear enhancement along the lateral margin, probably postsurgical. Recommend continued attention on follow-up. Electronically Signed   By: Margaretha Sheffield M.D.    On: 09/29/2022 08:50    [place] Clinician Interpretation: I have personally reviewed the radiological images as listed.  My interpretation, in the context of the patient's clinical presentation, is treatment effect vs true progression   Assessment/Plan Malignant melanoma metastatic to brain (  [place])  Xavier Jones presents today with clinical progression which localizes to left pontine tegmentum.  MRI brain demonstrates further progression of disease within pons and left MCP.  Etiology remains favored radionecrosis over refractory melanoma, though both are on differential.    We recommended increasing decadron to 4mg  TID x7 days given prior response to increased dose of corticosteroids.  We will review his case in detail in upcoming CNS tumor board.  Can discuss options moving forward including utility of potential PET study, biopsy, re-irradiation, avastin.  Though we might prefer transition to avastin at this time, risk of brainstem hemorrhage given melanoma histology is concerning.    We will give him a call in 7 days to assess response to steroid intervention.  We appreciate the opportunity to participate in the care of Xavier Jones.   He will continue to follow with Dr. Hinton Rao for nivolumab  All questions were answered. The patient knows to call the clinic with any problems, questions or concerns. No barriers to learning were detected.  The total time spent in the encounter was 40 minutes and more than 50% was on counseling and review of test results   [name], MD Medical Director of Neuro-Oncology [facility] [place] 09/29/22 12:35 PM

## 2022-09-30 ENCOUNTER — Telehealth: Payer: Self-pay | Admitting: Internal Medicine

## 2022-09-30 NOTE — Telephone Encounter (Signed)
Scheduled per 03/25 los, patient has been called and voicemail was left.

## 2022-10-06 ENCOUNTER — Inpatient Hospital Stay: Payer: Medicare Other | Attending: Internal Medicine

## 2022-10-06 ENCOUNTER — Telehealth: Payer: Self-pay | Admitting: Internal Medicine

## 2022-10-06 ENCOUNTER — Inpatient Hospital Stay (HOSPITAL_BASED_OUTPATIENT_CLINIC_OR_DEPARTMENT_OTHER): Payer: Medicare Other | Admitting: Internal Medicine

## 2022-10-06 DIAGNOSIS — C7931 Secondary malignant neoplasm of brain: Secondary | ICD-10-CM

## 2022-10-06 NOTE — Progress Notes (Signed)
I connected with Xavier Jones on 10/06/22 at  9:30 AM EDT by telephone visit and verified that I am speaking with the correct person using two identifiers.  I discussed the limitations, risks, security and privacy concerns of performing an evaluation and management service by telemedicine and the availability of in-person appointments. I also discussed with the patient that there may be a patient responsible charge related to this service. The patient expressed understanding and agreed to proceed.   Other persons participating in the visit and their role in the encounter:  n/a  Patient's location:  Home Provider's location:  Office Chief Complaint:  Malignant melanoma metastatic to brain  History of Present Ilness: Xavier Jones unfortunately describes no improvement in his facial weakness since increasing decadron to 4mg  three times per day.  Continues to have very little use of the left side of the face, difficult to close eyelid fully as prior.  No other new or progressive changes.  Observations: Language and cognition at baseline  Assessment and Plan: Malignant melanoma metastatic to brain  Progressive changes in the brainstem are thus far clinically refractory to high dose corticosteroids.  He will bring decadron back down to 4mg  daily.    He remains a meaningful risk for hemorrhagic conversion with avastin.    We will  touch base with nuclear medicine to consider FDG-PET.  If highly metabolic, could potentially justify re-irradiation of the lesion.    Pending PET evaluation, will return next month with MRI brain study.  Follow Up Instructions: RTC as discussed.  I discussed the assessment and treatment plan with the patient.  The patient was provided an opportunity to ask questions and all were answered.  The patient agreed with the plan and demonstrated understanding of the instructions.    The patient was advised to call back or seek an in-person evaluation if the symptoms  worsen or if the condition fails to improve as anticipated.    [name], MD   I provided 22 minutes of non face-to-face telephone visit time during this encounter, and > 50% was spent counseling as documented under my assessment & plan.

## 2022-10-06 NOTE — Telephone Encounter (Signed)
Reached out to patient to schedule per 4/1 LOS patient aware of date and time of appoinment.

## 2022-10-07 ENCOUNTER — Other Ambulatory Visit: Payer: Self-pay

## 2022-10-07 ENCOUNTER — Telehealth: Payer: Self-pay | Admitting: *Deleted

## 2022-10-07 ENCOUNTER — Other Ambulatory Visit: Payer: Self-pay | Admitting: Internal Medicine

## 2022-10-07 ENCOUNTER — Other Ambulatory Visit: Payer: Self-pay | Admitting: Radiation Therapy

## 2022-10-07 ENCOUNTER — Telehealth: Payer: Self-pay | Admitting: Internal Medicine

## 2022-10-07 DIAGNOSIS — C7931 Secondary malignant neoplasm of brain: Secondary | ICD-10-CM

## 2022-10-07 NOTE — Telephone Encounter (Signed)
Called patient per 4/2 IB to schedule f/u after scans. Patient scheduled and notified.

## 2022-10-07 NOTE — Telephone Encounter (Signed)
PC to patient, informed him Dr Mickeal Skinner consulted with Dr Leonia Reeves & it was determined a FDG-PET would be advantageous.  Dr Mickeal Skinner has ordered the scan which has been authorized by insurance.  Informed patient Central Scheduling should be contacting him soon to schedule the scan, he verbalizes understanding.

## 2022-10-13 ENCOUNTER — Telehealth: Payer: Self-pay | Admitting: *Deleted

## 2022-10-13 NOTE — Telephone Encounter (Signed)
Communicated response to the patient.  He stated understanding.

## 2022-10-13 NOTE — Telephone Encounter (Signed)
Attempted to reach patient, left message requesting call back to advise of Dr Liana Gerold response.

## 2022-10-13 NOTE — Telephone Encounter (Signed)
Patient called as directed by Dr Barbaraann Cao if patient should develop any further symptoms.  Reports that over the weekend he has begun to have some dizziness and pressure behind his ear.  States that he is still on Decadron 4 mg daily and Trental + Vit E as prescribed by Dr Barbaraann Cao previously.  Denies having any nasal drainage, cough, nasal pressure, or history of seasonal allergies.  Routing to Dr Barbaraann Cao to please advise how to proceed.

## 2022-10-15 NOTE — Progress Notes (Signed)
Patient Care Team: [name], NP as PCP - General (Nurse Practitioner) Dellia Beckwith, MD as Consulting Physician (Oncology) Lance Bosch, MD as Consulting Physician (Radiation Oncology)  Clinic Day: 10/16/22  Referring physician: [name], NP  ASSESSMENT & PLAN:  Assessment & Plan: Melanoma of skin (HCC) Stage IIB malignant melanoma of the right posterior shoulder, Breslow's depth 3.3 mm, Clark level IV, treated with wide local excision, diagnosed in May 2021.  There was still a question as to whether this represented a metastatic lesion, rather than a primary, based on the presence of 2 nodules and no epidermal involvement.  MRI head and CT chest, abdomen and pelvis in June were negative for metastatic disease.   No adjuvant therapy was recommended.  PET scan in September was negative for metastatic disease at that time. CT scan in January, 2024, show good response in the lung and skin.   Metastasis to subcutaneous tissue  He presented with a new area of concern to the left mid back in June of this year. A superficial nodule was noted approximately 1 cm to the right of his upper thoracic spine,and it had increased in size. He was sent for biopsy and that was performed on June 13 and confirmed metastatic malignant melanoma.   He has several hypermetabolic subcutaneous nodules seen on PET scan which are responding.  Metastasis to liver He has a small hypermetabolic liver lesion which is new and less than 1 cm but suspicious for metastasis.  Metastasis to lymph nodes He has multiple hypermetabolic nodes of the retroperitoneal and right pelvic area as well as right external iliac and a few additional nodes.  Pulmonary metastases He has bilateral hypermetabolic pulmonary nodules which increased in size by the time treatment was started.  The largest nodule is up to 2.3 cm in diameter. CT in January, 2024 reveals good response.   Metastases to brain This was suspicious on  the PET scan in the left frontoparietal lobe and so he had an MRI of the brain.  This confirmed a 2.6 cm heterogeneous lesion of the left superior temporal gyrus with mild enhancement and extensive surrounding edema.  He also has an 8 mm cortical lesion of the inferomedial right frontal lobe with mild edema, a 2 mm mildly enhancing cortical lesion of the inferolateral left frontal lobe and a intrinsically T1 hyperintense lesion of the left lateral pons measuring 7 mm with surrounding edema.  He has 4 lesions in total but 3 are less than 1 cm.  He has received stereotactic radiation. He had neurologic symptoms in September and was placed on dexamethasone. He was scheduled for a MRI of the brain on 09/25/2022 which revealed a increased size of a hemorrhagic left pons/middle cerebellar peduncle lesion with increased surrounding edema, similar versus slightly decreased size of a 13 x 10 mm enhancing lesion in the medial right frontal lobe, and mildly diminished intrinsic T1 hyperintensity of the left temporal resection site with linear enhancement along the lateral margin, probably postsurgical/post radiation.    Family history of breast cancer Maternal family history of breast cancer in addition to his melanoma history which raises suspicion for possible BRCA mutation.  He met with the genetic counselors and [facility] RNA + hereditary cancer panel revealed variants of uncertain significance (VUS) of the APC, CDKN1C and FH genes.  Skin Rash The actual skin rash is minimal and mainly involving his scalp, but he has severe pruritus. He is currently on a lose dose Prednisone 10mg  daily  Plan He was scheduled for a MRI of the brain on 09/25/2022 which revealed a increased size of a hemorrhagic left pons/middle cerebellar peduncle lesion with increased surrounding edema, similar versus slightly decreased size of a 13 x 10 mm enhancing lesion in the medial right frontal lobe, and mildly diminished intrinsic T1  hyperintensity of the left temporal resection site with linear enhancement along the lateral margin, probably postsurgical. At this time it is suspicious for progressive disease but could be post radiation changes, Dr. Barbaraann Cao has recommended a repeat brain MRI in 8 weeks. He is scheduled for repeat MRI of the brain on 11/19/2022 and a PET scan on 10/23/2022. He is scheduled for day 1 cycle 11 of Nivolumab on 10/21/2022. We are keeping his systemic disease palliated for the time being. I will see him back in 4 weeks with CBC and CMP. The patient understands the plans discussed today and is in agreement with them.  I have answered his questions and reviewed this information in detail.The patient knows to contact our office if he develops concerns prior to his next appointment.  I provided 18 minutes of face-to-face time during this this encounter and > 50% was spent counseling as documented under my assessment and plan.   Dellia Beckwith, MD  [facility] AT [facility] [address] [place] [address] Dept: [phone] Dept Fax: [phone]   No orders of the defined types were placed in this encounter.     CHIEF COMPLAINT:  CC: Metastatic melanoma  Current Treatment: Corticosteroids and brain irradiation, followed by immunotherapy  INTERVAL HISTORY:  Xavier Jones is here today for repeat clinical assessment of his metastatic melanoma.  Patient states that he feels ok and complains of headaches, dizziness, and a slight voice change. He is scheduled to see Dr. Barbaraann Cao sometime next week. He was scheduled for a MRI of the brain on 09/25/2022 which revealed a increased size of a hemorrhagic left pons/middle cerebellar peduncle lesion with increased surrounding edema, similar versus slightly decreased size of a 13 x 10 mm enhancing lesion in the medial right frontal lobe, and mildly diminished intrinsic T1 hyperintensity of the left temporal resection  site with linear enhancement along the lateral margin, probably postsurgical. At this time it is suspicious for progressive disease but could be post radiation changes, Dr. Barbaraann Cao has recommended a repeat brain MRI in 8 weeks. He is scheduled for repeat MRI of the brain on 11/19/2022 and a PET scan on 10/23/2022. He is scheduled for day 1 cycle 11 of Nivolumab on 10/21/2022. We are keeping his systemic disease palliated for the time being. I will see him back in 4 weeks with CBC and CMP. He denies signs of infection such as sore throat, sinus drainage, cough, or urinary symptoms.  He denies fevers or recurrent chills. He denies pain. He denies nausea, vomiting, chest pain, dyspnea or cough. His appetite is ok and his weight has decreased 2 pounds over last 2 weeks .   I have reviewed the past medical history, past surgical history, social history and family history with the patient and they are unchanged from previous note. ALLERGIES:  is allergic to sulfa antibiotics and testosterone.  MEDICATIONS:  Current Outpatient Medications  Medication Sig Dispense Refill   acebutolol (SECTRAL) 200 MG capsule Take 1 capsule (200 mg total) by mouth daily. 90 capsule 3   acyclovir (ZOVIRAX) 200 MG capsule Take by mouth.     amitriptyline (ELAVIL) 25 MG tablet  Take 50 mg by mouth at bedtime.     dexamethasone (DECADRON) 4 MG tablet Take 1 tablet (4 mg total) by mouth 3 (three) times daily. 90 tablet 1   ergocalciferol (VITAMIN D2) 1.25 MG (50000 UT) capsule Take 50,000 Units by mouth every Wednesday.     HYDROcodone-acetaminophen (NORCO/VICODIN) 5-325 MG tablet Take by mouth.     hydrOXYzine (ATARAX) 25 MG tablet Take 1 tablet (25 mg total) by mouth 3 (three) times daily as needed for itching. 60 tablet 3   lidocaine-prilocaine (EMLA) cream Apply 1 Application topically as needed. 30 g 0   pentoxifylline (TRENTAL) 400 MG CR tablet Take 1 tablet (400 mg total) by mouth in the morning and at bedtime. 60 tablet 3    rivaroxaban (XARELTO) 20 MG TABS tablet Take 1 tablet (20 mg total) by mouth daily with supper. Start after completing 3 weeks of Xarelto 15 mg twice daily 30 tablet 3   vitamin E 180 MG (400 UNITS) capsule Take 1 capsule (400 Units total) by mouth daily. 30 capsule 2   No current facility-administered medications for this visit.    HISTORY OF PRESENT ILLNESS:   Oncology History  Melanoma of skin (HCC)  12/01/2019 Initial Diagnosis   Melanoma of skin (HCC)   12/05/2019 Cancer Staging   Staging form: Melanoma of the Skin, AJCC 8th Edition - Clinical stage from 12/05/2019: Stage IIA (cT3a, cN0, cM0) - Signed by Dellia Beckwith, MD on 05/30/2020   02/18/2021 Genetic Testing   No pathogenic variants detected in [facility] Multi-Cancer +RNA Panel.  Variants of uncertain significance detected in APC (c.681C>G (p.Asp227Glu)), CDKN1C (c.610C>G (p.Pro204Ala)), and FH (c.1151C>T (p.Ala384Val)).  The report date is February 18, 2021.    The Multi-Cancer + RNA Panel offered by [facility] includes sequencing and/or deletion/duplication analysis of the following 84 genes:  AIP*, ALK, APC*, ATM*, AXIN2*, BAP1*, BARD1*, BLM*, BMPR1A*, BRCA1*, BRCA2*, BRIP1*, CASR, CDC73*, CDH1*, CDK4, CDKN1B*, CDKN1C*, CDKN2A, CEBPA, CHEK2*, CTNNA1*, DICER1*, DIS3L2*, EGFR, EPCAM, FH*, FLCN*, GATA2*, GPC3, GREM1, HOXB13, HRAS, KIT, MAX*, MEN1*, MET, MITF, MLH1*, MSH2*, MSH3*, MSH6*, MUTYH*, NBN*, NF1*, NF2*, NTHL1*, PALB2*, PDGFRA, PHOX2B, PMS2*, POLD1*, POLE*, POT1*, PRKAR1A*, PTCH1*, PTEN*, RAD50*, RAD51C*, RAD51D*, RB1*, RECQL4, RET, RUNX1*, SDHA*, SDHAF2*, SDHB*, SDHC*, SDHD*, SMAD4*, SMARCA4*, SMARCB1*, SMARCE1*, STK11*, SUFU*, TERC, TERT, TMEM127*, Tp53*, TSC1*, TSC2*, VHL*, WRN*, and WT1.  RNA analysis is performed for * genes.   01/30/2022 - 02/20/2022 Chemotherapy   Patient is on Treatment Plan : MELANOMA Nivolumab + Ipilimumab (1/3) q21d / Nivolumab q28d     01/30/2022 -  Chemotherapy   Patient is on Treatment Plan :  MELANOMA Nivolumab (480) q28d     Malignant melanoma metastatic to brain (HCC)  12/19/2021 Initial Diagnosis   Malignant melanoma metastatic to brain (HCC)   01/30/2022 - 02/20/2022 Chemotherapy   Patient is on Treatment Plan : MELANOMA Nivolumab + Ipilimumab (1/3) q21d / Nivolumab q28d     01/30/2022 -  Chemotherapy   Patient is on Treatment Plan : MELANOMA Nivolumab (480) q28d     Malignant melanoma metastatic to lung (HCC)  12/19/2021 Initial Diagnosis   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 Small nonocclusive segmental level  pulmonary embolism in the  inferior LEFT upper lobe.  2. Marked bronchial wall thickening with areas of patchy basilar  opacities and new ground-glass opacities in the upper lobes.  Constellation of findings may be infectious bronchiolitis/bronchitis  though the possibility of drug related changes in the setting of  immunotherapy are also considered. Furthermore, given basilar  predominance and some material in basilar bronchial structures would  correlate with any risk factors for or history of aspiration.  3. Response to therapy of bilateral metastatic lesions. Some lymph  nodes in the chest with interval increase in size are equivocal at  this time, potentially reactive, but warrant attention on subsequent  imaging.    07/22/2022 Imaging   CT chest, abdomen and pelvis:  IMPRESSION:  1. Interval positive response to therapy. Resolved subcarinal and  bilateral hilar lymphadenopathy. Stable to decreased left pulmonary  nodules. No new or progressive metastatic disease in the chest,  abdomen or pelvis.  2. One vessel coronary atherosclerosis.  3. Aortic Atherosclerosis  (ICD10-I70.0).    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 Interval positive response to therapy. Resolved subcarinal and  bilateral hilar lymphadenopathy. Stable to decreased left pulmonary  nodules. No new or progressive metastatic disease in the chest,  abdomen or pelvis.  2. One vessel coronary atherosclerosis.  3. Aortic Atherosclerosis (ICD10-I70.0).        REVIEW OF SYSTEMS:   Review of Systems  Constitutional: Negative.  Negative for appetite change, chills, diaphoresis, fatigue, fever and unexpected weight change.  HENT:   Negative for hearing loss, lump/mass, mouth sores, nosebleeds, sore throat, tinnitus, trouble swallowing and voice change.   Eyes: Negative.  Negative for eye problems and icterus.       States his vision are blurry  Respiratory: Negative.  Negative for chest tightness, cough, hemoptysis, shortness of breath and wheezing.   Cardiovascular: Negative.  Negative for chest pain, leg swelling and palpitations.  Gastrointestinal: Negative.  Negative for abdominal distention, abdominal pain, blood in stool, constipation, diarrhea, nausea, rectal pain and vomiting.  Endocrine: Negative.  Negative for hot flashes.  Genitourinary: Negative.  Negative for bladder incontinence, difficulty urinating, dyspareunia, dysuria, frequency, hematuria, nocturia, pelvic pain and penile discharge.   Musculoskeletal: Negative.  Negative for arthralgias, back pain, flank pain, gait problem, myalgias, neck pain and neck stiffness.  Skin: Negative.  Negative for itching, rash and wound.  Neurological:   Positive for dizziness, headaches (comes in waves) and speech difficulty. Negative for extremity weakness, gait problem, light-headedness, numbness and seizures.  Hematological: Negative.  Negative for adenopathy. Does not bruise/bleed easily.  Psychiatric/Behavioral: Negative.  Negative for confusion, decreased concentration, depression, sleep disturbance and suicidal ideas. The patient is not nervous/anxious.    Marland Kitchen   VITALS:  Blood pressure 128/83, pulse 92, temperature 98.3 F (36.8 C), temperature source Oral, resp. rate 16, height 5\' 9"  (1.753 m), weight 165 lb 14.4 oz (75.3 kg), SpO2 97 %.  Wt Readings from Last 3 Encounters:  10/21/22 164 lb 1.3 oz (74.4 kg)  10/16/22 165 lb 14.4 oz (75.3 kg)  09/29/22 167 lb 3.2  oz (75.8 kg)    Body mass index is 24.5 kg/m.  Performance status (ECOG): 1 - Symptomatic but completely ambulatory  PHYSICAL EXAM:    Physical Exam Vitals and nursing note reviewed.  Constitutional:      General: He is not in acute distress.    Appearance: Normal appearance. He is normal weight. He is not ill-appearing, toxic-appearing or diaphoretic.  HENT:     Head: Normocephalic and atraumatic.     Right Ear: Tympanic membrane, ear canal and external ear normal. There is no impacted cerumen.     Left Ear: Tympanic membrane, ear canal and external ear normal. There is no impacted cerumen.     Nose: Nose normal. No congestion or rhinorrhea.     Mouth/Throat:     Mouth: Mucous membranes are moist.     Pharynx: Oropharynx is clear. No oropharyngeal exudate or posterior oropharyngeal erythema.  Eyes:     General: No scleral icterus.       Right eye: No discharge.        Left eye: No discharge.     Extraocular Movements: Extraocular movements intact.     Conjunctiva/sclera: Conjunctivae normal.     Pupils: Pupils are equal, round, and reactive to light.  Neck:     Vascular: No carotid bruit.  Cardiovascular:     Rate and Rhythm: Normal rate and regular  rhythm.     Pulses: Normal pulses.     Heart sounds: Normal heart sounds. No murmur heard.    No friction rub. No gallop.  Pulmonary:     Effort: Pulmonary effort is normal. No respiratory distress.     Breath sounds: Normal breath sounds. No stridor. No wheezing, rhonchi or rales.  Chest:     Chest wall: No tenderness.  Abdominal:     General: Bowel sounds are normal. There is no distension.     Palpations: Abdomen is soft. There is no hepatomegaly, splenomegaly or mass.     Tenderness: There is no abdominal tenderness. There is no right CVA tenderness, left CVA tenderness, guarding or rebound.     Hernia: No hernia is present.  Musculoskeletal:        General: No swelling, tenderness, deformity or signs of injury. Normal range of motion.     Cervical back: Normal range of motion and neck supple. No rigidity or tenderness.     Right lower leg: No edema.     Left lower leg: No edema.     Comments: Large deep scar in the right posterior shoulder that is well healed.   Lymphadenopathy:     Cervical: No cervical adenopathy.     Upper Body:     Right upper body: No supraclavicular or axillary adenopathy.     Left upper body: No supraclavicular or axillary adenopathy.     Lower Body: No right inguinal adenopathy. No left inguinal adenopathy.  Skin:    General: Skin is warm and dry.     Coloration: Skin is not jaundiced.     Findings: No bruising, lesion or rash.     Comments: He has an additional nodule of the left flank which is smaller but firm.  Neurological:     General: No focal deficit present.     Mental Status: He is alert and oriented to person, place, and time. Mental status is at baseline.     Cranial Nerves: No cranial nerve deficit.     Sensory: No sensory deficit.     Motor:  No weakness.     Coordination: Coordination normal.     Gait: Gait normal.     Deep Tendon Reflexes: Reflexes normal.  Psychiatric:        Mood and Affect: Mood normal.        Behavior: Behavior  normal.        Thought Content: Thought content normal.        Judgment: Judgment normal.    LABORATORY DATA:  I have reviewed the data as listed    Component Value Date/Time   NA 138 10/16/2022 1347   NA 138 07/28/2022 0000   K 4.0 10/16/2022 1347   CL 105 10/16/2022 1347   CO2 25 10/16/2022 1347   GLUCOSE 124 (H) 10/16/2022 1347   BUN 18 10/16/2022 1347   BUN 13 07/28/2022 0000   CREATININE 1.07 10/16/2022 1347   CALCIUM 9.4 10/16/2022 1347   PROT 6.9 10/16/2022 1347   PROT 6.2 (A) 05/08/2022 0000   ALBUMIN 3.8 10/16/2022 1347   AST 20 10/16/2022 1347   ALT 17 10/16/2022 1347   ALKPHOS 49 10/16/2022 1347   BILITOT 1.0 10/16/2022 1347   GFRNONAA >60 10/16/2022 1347   No results found for: "SPEP", "UPEP"  Lab Results  Component Value Date   WBC 6.4 10/16/2022   NEUTROABS 4.8 10/16/2022   HGB 13.5 10/16/2022   HCT 40.6 10/16/2022   MCV 97.1 10/16/2022   PLT 338 10/16/2022     Chemistry      Component Value Date/Time   NA 138 10/16/2022 1347   NA 138 07/28/2022 0000   K 4.0 10/16/2022 1347   CL 105 10/16/2022 1347   CO2 25 10/16/2022 1347   BUN 18 10/16/2022 1347   BUN 13 07/28/2022 0000   CREATININE 1.07 10/16/2022 1347   GLU 93 07/28/2022 0000      Component Value Date/Time   CALCIUM 9.4 10/16/2022 1347   ALKPHOS 49 10/16/2022 1347   AST 20 10/16/2022 1347   ALT 17 10/16/2022 1347   BILITOT 1.0 10/16/2022 1347     Component Ref Range & Units 3 wk ago (09/18/22) 1 mo ago (08/21/22) 2 mo ago (07/24/22) 3 mo ago (06/26/22) 4 mo ago (06/12/22) 4 mo ago (06/05/22) 4 mo ago (05/28/22)  T4, Total 4.5 - 12.0 ug/dL 8.3 7.4 CM 7.0 CM 7.2 CM 8.5 CM 7.3 CM 8.2 CM   Component Ref Range & Units 3 wk ago (09/18/22) 1 mo ago (08/21/22) 2 mo ago (07/24/22) 3 mo ago (06/26/22) 4 mo ago (06/12/22) 4 mo ago (06/05/22) 4 mo ago (05/28/22)  TSH 0.350 - 4.500 uIU/mL 0.757 0.761 CM 1.550 CM 1.581 CM 1.001 CM 0.805 CM 1.276 CM    RADIOGRAPHIC STUDIES: I have  personally reviewed the radiological images as listed and agreed with the findings in the report. NM PET Metabolic Brain  Result Date: 10/26/2022 CLINICAL DATA:  Malignant melanoma with brain metastasis. Concern for progression metastatic disease versus tumor proces following radiotherapy. EXAM: NM PET METABOLIC BRAIN TECHNIQUE: 106 mCi F-18 FDG was injected intravenously. Full-ring PET imaging was performed from the vertex to skull base. CT data was obtained and used for attenuation correction and anatomic localization. FASTING BLOOD GLUCOSE:  Value: 9.9 mg/dl COMPARISON:  Contrast brain MRI 09/25/2022 and 06/18/2022 and 12/26/2021. Skull base to thigh whole-body FDG PET scan 12/12/2021 FINDINGS: Post processing MRI PET fusion performed. The co-registered data sets demonstrate no increased metabolic activity on the PET data set to correspond to the enhancing lesion  in the LEFT brainstem. There is photopenia centrally within the lesion suggesting necrosis (image 66 of PET data set). No increased peripheral metabolic activity to correspond to the enhancing rim on coregistered MRI. IMPRESSION: No increased metabolic activity associated with the LEFT brainstem lesion identified on coregistered MRI. 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 Electronically Signed   By: Genevive Bi M.D.   On: 10/26/2022 13:46       I,Jasmine M Lassiter,acting as a scribe for Dellia Beckwith, MD.,have documented all relevant documentation on the behalf of Dellia Beckwith, MD,as directed by  Dellia Beckwith, MD while in the presence of Dellia Beckwith, MD.

## 2022-10-16 ENCOUNTER — Encounter: Payer: Self-pay | Admitting: Oncology

## 2022-10-16 ENCOUNTER — Inpatient Hospital Stay: Payer: Medicare Other | Attending: Hematology and Oncology | Admitting: Oncology

## 2022-10-16 ENCOUNTER — Inpatient Hospital Stay: Payer: Medicare Other

## 2022-10-16 DIAGNOSIS — C787 Secondary malignant neoplasm of liver and intrahepatic bile duct: Secondary | ICD-10-CM | POA: Diagnosis not present

## 2022-10-16 DIAGNOSIS — C7800 Secondary malignant neoplasm of unspecified lung: Secondary | ICD-10-CM

## 2022-10-16 DIAGNOSIS — C439 Malignant melanoma of skin, unspecified: Secondary | ICD-10-CM

## 2022-10-16 DIAGNOSIS — C778 Secondary and unspecified malignant neoplasm of lymph nodes of multiple regions: Secondary | ICD-10-CM | POA: Insufficient documentation

## 2022-10-16 DIAGNOSIS — C4361 Malignant melanoma of right upper limb, including shoulder: Secondary | ICD-10-CM | POA: Insufficient documentation

## 2022-10-16 DIAGNOSIS — Z79899 Other long term (current) drug therapy: Secondary | ICD-10-CM | POA: Insufficient documentation

## 2022-10-16 DIAGNOSIS — C7931 Secondary malignant neoplasm of brain: Secondary | ICD-10-CM | POA: Insufficient documentation

## 2022-10-16 DIAGNOSIS — Z5112 Encounter for antineoplastic immunotherapy: Secondary | ICD-10-CM | POA: Diagnosis not present

## 2022-10-16 LAB — CBC WITH DIFFERENTIAL (CANCER CENTER ONLY)
Abs Immature Granulocytes: 0.06 10*3/uL (ref 0.00–0.07)
Basophils Absolute: 0 10*3/uL (ref 0.0–0.1)
Basophils Relative: 1 %
Eosinophils Absolute: 0 10*3/uL (ref 0.0–0.5)
Eosinophils Relative: 0 %
HCT: 40.6 % (ref 39.0–52.0)
Hemoglobin: 13.5 g/dL (ref 13.0–17.0)
Immature Granulocytes: 1 %
Lymphocytes Relative: 15 %
Lymphs Abs: 1 10*3/uL (ref 0.7–4.0)
MCH: 32.3 pg (ref 26.0–34.0)
MCHC: 33.3 g/dL (ref 30.0–36.0)
MCV: 97.1 fL (ref 80.0–100.0)
Monocytes Absolute: 0.5 10*3/uL (ref 0.1–1.0)
Monocytes Relative: 8 %
Neutro Abs: 4.8 10*3/uL (ref 1.7–7.7)
Neutrophils Relative %: 75 %
Platelet Count: 338 10*3/uL (ref 150–400)
RBC: 4.18 MIL/uL — ABNORMAL LOW (ref 4.22–5.81)
RDW: 13.2 % (ref 11.5–15.5)
WBC Count: 6.4 10*3/uL (ref 4.0–10.5)
nRBC: 0 % (ref 0.0–0.2)

## 2022-10-16 LAB — CMP (CANCER CENTER ONLY)
ALT: 17 U/L (ref 0–44)
AST: 20 U/L (ref 15–41)
Albumin: 3.8 g/dL (ref 3.5–5.0)
Alkaline Phosphatase: 49 U/L (ref 38–126)
Anion gap: 8 (ref 5–15)
BUN: 18 mg/dL (ref 6–20)
CO2: 25 mmol/L (ref 22–32)
Calcium: 9.4 mg/dL (ref 8.9–10.3)
Chloride: 105 mmol/L (ref 98–111)
Creatinine: 1.07 mg/dL (ref 0.61–1.24)
GFR, Estimated: >60 mL/min (ref >60)
Glucose, Bld: 124 mg/dL — ABNORMAL HIGH (ref 70–99)
Potassium: 4 mmol/L (ref 3.5–5.1)
Sodium: 138 mmol/L (ref 135–145)
Total Bilirubin: 1 mg/dL (ref 0.3–1.2)
Total Protein: 6.9 g/dL (ref 6.5–8.1)

## 2022-10-16 LAB — TSH: TSH: 0.79 u[IU]/mL (ref 0.350–4.500)

## 2022-10-18 LAB — T4: T4, Total: 7.4 ug/dL (ref 4.5–12.0)

## 2022-10-20 MED FILL — Nivolumab IV Soln 240 MG/24ML: INTRAVENOUS | Qty: 48 | Status: AC

## 2022-10-21 ENCOUNTER — Encounter: Payer: Self-pay | Admitting: Oncology

## 2022-10-21 ENCOUNTER — Inpatient Hospital Stay: Payer: Medicare Other

## 2022-10-21 VITALS — BP 142/80 | HR 81 | Temp 98.4°F | Resp 18 | Ht 69.0 in | Wt 164.1 lb

## 2022-10-21 DIAGNOSIS — C778 Secondary and unspecified malignant neoplasm of lymph nodes of multiple regions: Secondary | ICD-10-CM | POA: Diagnosis not present

## 2022-10-21 DIAGNOSIS — C4361 Malignant melanoma of right upper limb, including shoulder: Secondary | ICD-10-CM | POA: Diagnosis not present

## 2022-10-21 DIAGNOSIS — C787 Secondary malignant neoplasm of liver and intrahepatic bile duct: Secondary | ICD-10-CM | POA: Diagnosis not present

## 2022-10-21 DIAGNOSIS — C7800 Secondary malignant neoplasm of unspecified lung: Secondary | ICD-10-CM

## 2022-10-21 DIAGNOSIS — C439 Malignant melanoma of skin, unspecified: Secondary | ICD-10-CM

## 2022-10-21 DIAGNOSIS — C7931 Secondary malignant neoplasm of brain: Secondary | ICD-10-CM | POA: Diagnosis not present

## 2022-10-21 DIAGNOSIS — Z5112 Encounter for antineoplastic immunotherapy: Secondary | ICD-10-CM | POA: Diagnosis not present

## 2022-10-21 MED ORDER — HEPARIN SOD (PORK) LOCK FLUSH 100 UNIT/ML IV SOLN
500.0000 [IU] | Freq: Once | INTRAVENOUS | Status: AC | PRN
  Administered 2022-10-21: 500 [IU]

## 2022-10-21 MED ORDER — SODIUM CHLORIDE 0.9% FLUSH
10.0000 mL | INTRAVENOUS | Status: DC | PRN
  Administered 2022-10-21: 10 mL

## 2022-10-21 MED ORDER — SODIUM CHLORIDE 0.9 % IV SOLN: Freq: Once | INTRAVENOUS | Status: AC

## 2022-10-21 MED ORDER — SODIUM CHLORIDE 0.9 % IV SOLN
480.0000 mg | Freq: Once | INTRAVENOUS | Status: AC
  Administered 2022-10-21: 480 mg via INTRAVENOUS
  Filled 2022-10-21: qty 48

## 2022-10-21 NOTE — Patient Instructions (Signed)
Nivolumab Injection What is this medication? NIVOLUMAB (nye VOL ue mab) treats some types of cancer. It works by helping your immune system slow or stop the spread of cancer cells. It is a monoclonal antibody. This medicine may be used for other purposes; ask your health care provider or pharmacist if you have questions. COMMON BRAND NAME(S): Opdivo What should I tell my care team before I take this medication? They need to know if you have any of these conditions: Allogeneic stem cell transplant (uses someone else's stem cells) Autoimmune diseases, such as Crohn disease, ulcerative colitis, lupus History of chest radiation Nervous system problems, such as Guillain-Barre syndrome or myasthenia gravis Organ transplant An unusual or allergic reaction to nivolumab, other medications, foods, dyes, or preservatives Pregnant or trying to get pregnant Breast-feeding How should I use this medication? This medication is infused into a vein. It is given in a hospital or clinic setting. A special MedGuide will be given to you before each treatment. Be sure to read this information carefully each time. Talk to your care team about the use of this medication in children. While it may be prescribed for children as young as 12 years for selected conditions, precautions do apply. Overdosage: If you think you have taken too much of this medicine contact a poison control center or emergency room at once. NOTE: This medicine is only for you. Do not share this medicine with others. What if I miss a dose? Keep appointments for follow-up doses. It is important not to miss your dose. Call your care team if you are unable to keep an appointment. What may interact with this medication? Interactions have not been studied. This list may not describe all possible interactions. Give your health care provider a list of all the medicines, herbs, non-prescription drugs, or dietary supplements you use. Also tell them if you  smoke, drink alcohol, or use illegal drugs. Some items may interact with your medicine. What should I watch for while using this medication? Your condition will be monitored carefully while you are receiving this medication. You may need blood work while taking this medication. This medication may cause serious skin reactions. They can happen weeks to months after starting the medication. Contact your care team right away if you notice fevers or flu-like symptoms with a rash. The rash may be red or purple and then turn into blisters or peeling of the skin. You may also notice a red rash with swelling of the face, lips, or lymph nodes in your neck or under your arms. Tell your care team right away if you have any change in your eyesight. Talk to your care team if you are pregnant or think you might be pregnant. A negative pregnancy test is required before starting this medication. A reliable form of contraception is recommended while taking this medication and for 5 months after the last dose. Talk to your care team about effective forms of contraception. Do not breast-feed while taking this medication and for 5 months after the last dose. What side effects may I notice from receiving this medication? Side effects that you should report to your care team as soon as possible: Allergic reactions--skin rash, itching, hives, swelling of the face, lips, tongue, or throat Dry cough, shortness of breath or trouble breathing Eye pain, redness, irritation, or discharge with blurry or decreased vision Heart muscle inflammation--unusual weakness or fatigue, shortness of breath, chest pain, fast or irregular heartbeat, dizziness, swelling of the ankles, feet, or hands Hormone   gland problems--headache, sensitivity to light, unusual weakness or fatigue, dizziness, fast or irregular heartbeat, increased sensitivity to cold or heat, excessive sweating, constipation, hair loss, increased thirst or amount of urine,  tremors or shaking, irritability Infusion reactions--chest pain, shortness of breath or trouble breathing, feeling faint or lightheaded Kidney injury (glomerulonephritis)--decrease in the amount of urine, red or dark brown urine, foamy or bubbly urine, swelling of the ankles, hands, or feet Liver injury--right upper belly pain, loss of appetite, nausea, light-colored stool, dark yellow or brown urine, yellowing skin or eyes, unusual weakness or fatigue Pain, tingling, or numbness in the hands or feet, muscle weakness, change in vision, confusion or trouble speaking, loss of balance or coordination, trouble walking, seizures Rash, fever, and swollen lymph nodes Redness, blistering, peeling, or loosening of the skin, including inside the mouth Sudden or severe stomach pain, bloody diarrhea, fever, nausea, vomiting Side effects that usually do not require medical attention (report these to your care team if they continue or are bothersome): Bone, joint, or muscle pain Diarrhea Fatigue Loss of appetite Nausea Skin rash This list may not describe all possible side effects. Call your doctor for medical advice about side effects. You may report side effects to FDA at 1-800-FDA-1088. Where should I keep my medication? This medication is given in a hospital or clinic. It will not be stored at home. NOTE: This sheet is a summary. It may not cover all possible information. If you have questions about this medicine, talk to your doctor, pharmacist, or health care provider.  2023 Elsevier/Gold Standard (2014-05-15 00:00:00)  

## 2022-10-23 ENCOUNTER — Ambulatory Visit (HOSPITAL_COMMUNITY)
Admission: RE | Admit: 2022-10-23 | Discharge: 2022-10-23 | Disposition: A | Discharge status: Home / Self Care | Payer: Medicare Other | Source: Ambulatory Visit | Attending: Internal Medicine | Admitting: Internal Medicine

## 2022-10-23 ENCOUNTER — Encounter: Payer: Self-pay | Admitting: Oncology

## 2022-10-23 DIAGNOSIS — C7931 Secondary malignant neoplasm of brain: Secondary | ICD-10-CM

## 2022-10-23 LAB — GLUCOSE, CAPILLARY: Glucose-Capillary: 106 mg/dL — ABNORMAL HIGH (ref 70–99)

## 2022-10-23 MED ORDER — FLUDEOXYGLUCOSE F - 18 (FDG) INJECTION
10.0000 | Freq: Once | INTRAVENOUS | Status: AC | PRN
  Administered 2022-10-23: 9.9 via INTRAVENOUS

## 2022-10-24 ENCOUNTER — Telehealth: Payer: Self-pay | Admitting: Internal Medicine

## 2022-10-24 ENCOUNTER — Encounter: Payer: Self-pay | Admitting: Oncology

## 2022-10-24 NOTE — Telephone Encounter (Signed)
Scheduled per 04/18 in-basket message, patient has been called and voicemail was left.

## 2022-10-27 ENCOUNTER — Inpatient Hospital Stay: Payer: Medicare Other

## 2022-10-27 ENCOUNTER — Encounter: Payer: Self-pay | Admitting: Oncology

## 2022-10-27 ENCOUNTER — Inpatient Hospital Stay (HOSPITAL_BASED_OUTPATIENT_CLINIC_OR_DEPARTMENT_OTHER): Payer: Medicare Other | Admitting: Internal Medicine

## 2022-10-27 DIAGNOSIS — C7931 Secondary malignant neoplasm of brain: Secondary | ICD-10-CM | POA: Diagnosis not present

## 2022-10-27 NOTE — Progress Notes (Signed)
I connected with Xavier Jones on 10/27/22 at  2:00 PM EDT by telephone visit and verified that I am speaking with the correct person using two identifiers.  I discussed the limitations, risks, security and privacy concerns of performing an evaluation and management service by telemedicine and the availability of in-person appointments. I also discussed with the patient that there may be a patient responsible charge related to this service. The patient expressed understanding and agreed to proceed.  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  He continues to have the same severe weakness in the left side of his face, including difficulty with closing his left eye.  Currently dosing decadron  daily as prior.  Completed PET study.  Observations: Language and cognition at baseline  Imaging:  [facility] Clinician Interpretation: I have personally reviewed the CNS images as listed.  My interpretation, in the context of the patient's clinical presentation, is  favor radiation necrosis  NM PET Metabolic Brain  Result Date: 10/26/2022 CLINICAL DATA:  Malignant melanoma with brain metastasis. Concern for progression metastatic disease versus tumor proces following radiotherapy. EXAM: NM PET METABOLIC BRAIN TECHNIQUE: 106 mCi F-18 FDG was injected intravenously. Full-ring PET imaging was performed from the vertex to skull base. CT data was obtained and used for attenuation correction and anatomic localization. FASTING BLOOD GLUCOSE:  Value: 9.9 mg/dl COMPARISON:  Contrast brain MRI 09/25/2022 and 06/18/2022 and 12/26/2021. Skull base to thigh whole-body FDG PET scan 12/12/2021 FINDINGS: Post processing MRI PET fusion performed. The co-registered data sets demonstrate no increased  metabolic activity on the PET data set to correspond to the enhancing lesion in the LEFT brainstem. There is photopenia centrally within the lesion suggesting necrosis (image 66 of PET data set). No increased peripheral metabolic activity to correspond to the enhancing rim on coregistered MRI. IMPRESSION: No increased metabolic activity associated with the LEFT brainstem lesion identified on coregistered MRI. 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 Electronically Signed   By: Genevive Bi M.D.   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  PET supports radiation treatment effect.   Will not recommend additional radiation at this time.  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  We will consider avastin if he develops gait imbalance or dysmetria.  He is agreeable with this.  Will con't decadron  daily in the interim.  Follow Up Instructions: RTC as scheduled after next MRI brain.  I discussed the assessment and treatment plan with the patient.  The patient was provided an opportunity to ask questions and all were answered.  The patient agreed with the plan and demonstrated understanding of the instructions.    The patient was advised to call back or seek an in-person evaluation if the symptoms worsen or if the condition fails to improve as anticipated.    Henreitta Leber, MD   I provided 22 minutes of non face-to-face telephone visit time during this encounter, and > 50% was spent counseling as documented under my assessment & plan.

## 2022-10-31 ENCOUNTER — Encounter: Payer: Self-pay | Admitting: Oncology

## 2022-11-04 ENCOUNTER — Encounter: Payer: Self-pay | Admitting: Oncology

## 2022-11-13 ENCOUNTER — Inpatient Hospital Stay: Payer: Medicare Other | Attending: Hematology and Oncology | Admitting: Oncology

## 2022-11-13 ENCOUNTER — Inpatient Hospital Stay: Payer: Medicare Other

## 2022-11-13 ENCOUNTER — Other Ambulatory Visit: Payer: Self-pay | Admitting: Oncology

## 2022-11-13 ENCOUNTER — Encounter: Payer: Self-pay | Admitting: Oncology

## 2022-11-13 VITALS — BP 136/97 | HR 86 | Temp 98.5°F | Resp 16 | Ht 69.0 in | Wt 174.2 lb

## 2022-11-13 DIAGNOSIS — C4361 Malignant melanoma of right upper limb, including shoulder: Secondary | ICD-10-CM | POA: Diagnosis not present

## 2022-11-13 DIAGNOSIS — C439 Malignant melanoma of skin, unspecified: Secondary | ICD-10-CM

## 2022-11-13 DIAGNOSIS — Z5112 Encounter for antineoplastic immunotherapy: Secondary | ICD-10-CM | POA: Insufficient documentation

## 2022-11-13 DIAGNOSIS — C778 Secondary and unspecified malignant neoplasm of lymph nodes of multiple regions: Secondary | ICD-10-CM | POA: Diagnosis not present

## 2022-11-13 DIAGNOSIS — C787 Secondary malignant neoplasm of liver and intrahepatic bile duct: Secondary | ICD-10-CM

## 2022-11-13 DIAGNOSIS — C7801 Secondary malignant neoplasm of right lung: Secondary | ICD-10-CM | POA: Diagnosis not present

## 2022-11-13 DIAGNOSIS — C7931 Secondary malignant neoplasm of brain: Secondary | ICD-10-CM

## 2022-11-13 DIAGNOSIS — C7800 Secondary malignant neoplasm of unspecified lung: Secondary | ICD-10-CM

## 2022-11-13 DIAGNOSIS — Z79620 Long term (current) use of immunosuppressive biologic: Secondary | ICD-10-CM | POA: Insufficient documentation

## 2022-11-13 DIAGNOSIS — C7802 Secondary malignant neoplasm of left lung: Secondary | ICD-10-CM | POA: Insufficient documentation

## 2022-11-13 LAB — CMP (CANCER CENTER ONLY)
ALT: 21 U/L (ref 0–44)
AST: 22 U/L (ref 15–41)
Albumin: 3.8 g/dL (ref 3.5–5.0)
Alkaline Phosphatase: 48 U/L (ref 38–126)
Anion gap: 10 (ref 5–15)
BUN: 24 mg/dL — ABNORMAL HIGH (ref 6–20)
CO2: 25 mmol/L (ref 22–32)
Calcium: 9.3 mg/dL (ref 8.9–10.3)
Chloride: 104 mmol/L (ref 98–111)
Creatinine: 0.94 mg/dL (ref 0.61–1.24)
GFR, Estimated: >60 mL/min (ref >60)
Glucose, Bld: 86 mg/dL (ref 70–99)
Potassium: 4 mmol/L (ref 3.5–5.1)
Sodium: 139 mmol/L (ref 135–145)
Total Bilirubin: 0.7 mg/dL (ref 0.3–1.2)
Total Protein: 6.7 g/dL (ref 6.5–8.1)

## 2022-11-13 LAB — CBC WITH DIFFERENTIAL (CANCER CENTER ONLY)
Abs Immature Granulocytes: 0.13 10*3/uL — ABNORMAL HIGH (ref 0.00–0.07)
Basophils Absolute: 0.1 10*3/uL (ref 0.0–0.1)
Basophils Relative: 1 %
Eosinophils Absolute: 0 10*3/uL (ref 0.0–0.5)
Eosinophils Relative: 1 %
HCT: 41 % (ref 39.0–52.0)
Hemoglobin: 13.4 g/dL (ref 13.0–17.0)
Immature Granulocytes: 2 %
Lymphocytes Relative: 17 %
Lymphs Abs: 1.3 10*3/uL (ref 0.7–4.0)
MCH: 32.7 pg (ref 26.0–34.0)
MCHC: 32.7 g/dL (ref 30.0–36.0)
MCV: 100 fL (ref 80.0–100.0)
Monocytes Absolute: 0.8 10*3/uL (ref 0.1–1.0)
Monocytes Relative: 10 %
Neutro Abs: 5.7 10*3/uL (ref 1.7–7.7)
Neutrophils Relative %: 69 %
Platelet Count: 354 10*3/uL (ref 150–400)
RBC: 4.1 MIL/uL — ABNORMAL LOW (ref 4.22–5.81)
RDW: 13.6 % (ref 11.5–15.5)
WBC Count: 8 10*3/uL (ref 4.0–10.5)
nRBC: 0 % (ref 0.0–0.2)

## 2022-11-13 LAB — TSH: TSH: 0.804 u[IU]/mL (ref 0.350–4.500)

## 2022-11-13 NOTE — Progress Notes (Signed)
Patient Care Team: [name], NP as PCP - General (Nurse Practitioner) Dellia Beckwith, MD as Consulting Physician (Oncology) Lance Bosch, MD as Consulting Physician (Radiation Oncology)  Clinic Day: 11/13/2022  Referring physician: [name], NP  ASSESSMENT & PLAN:  Assessment & Plan: Melanoma of skin (HCC) Stage IIB malignant melanoma of the right posterior shoulder, Breslow's depth 3.3 mm, Clark level IV, treated with wide local excision, diagnosed in May 2021.  There was still a question as to whether this represented a metastatic lesion, rather than a primary, based on the presence of 2 nodules and no epidermal involvement.  MRI head and CT chest, abdomen and pelvis in June were negative for metastatic disease.   No adjuvant therapy was recommended.  PET scan in September was negative for metastatic disease at that time.  By June 2023, he was found to have widespread metastatic disease including to the brain.  CT scans in January, 2024, showed good response in the lung and skin.   Metastasis to subcutaneous tissue  He presented with a new area of concern to the left mid back in June of 2023.  A superficial nodule was noted approximately 1 cm to the right of his upper thoracic spine,and it had increased in size. He was sent for biopsy and that was performed on June 13 and confirmed metastatic malignant melanoma.   He has several hypermetabolic subcutaneous nodules seen on PET scan which are responding.  Metastasis to liver He has a small hypermetabolic liver lesion which is new and less than 1 cm but suspicious for metastasis.  Metastasis to lymph nodes He has multiple hypermetabolic nodes of the retroperitoneal and right pelvic areas, as well as right external iliac and a few additional nodes.  Pulmonary metastases He has bilateral hypermetabolic pulmonary nodules which increased in size by the time treatment was started.  The largest nodule is up to 2.3 cm in diameter.  CT in January, 2024 reveals good response.   Metastases to brain This was suspicious on the PET scan in the left frontoparietal lobe and so he had an MRI of the brain.  This confirmed a 2.6 cm heterogeneous lesion of the left superior temporal gyrus with mild enhancement and extensive surrounding edema.  He also has an 8 mm cortical lesion of the inferomedial right frontal lobe with mild edema, a 2 mm mildly enhancing cortical lesion of the inferolateral left frontal lobe and a intrinsically T1 hyperintense lesion of the left lateral pons measuring 7 mm with surrounding edema.  He has 4 lesions in total but 3 are less than 1 cm.  He has received stereotactic radiation. He had neurologic symptoms in September and was placed on dexamethasone.  MRI of the brain on 09/25/2022 revealed increased size of a hemorrhagic left pons/middle cerebellar peduncle lesion with increased surrounding edema, similar versus slightly decreased size of a 13 x 10 mm enhancing lesion in the medial right frontal lobe, and mildly diminished intrinsic T1 hyperintensity of the left temporal resection site with linear enhancement along the lateral margin, probably postsurgical/post radiation.  He has now had a brain PET scan on October 23, 2022, which does not show hyperactivity but more radiation necrosis, and so these changes are more consistent with posttreatment changes.   Family history of breast cancer Maternal family history of breast cancer in addition to his melanoma history which raises suspicion for possible BRCA mutation.  He met with the genetic counselors and [facility] RNA + hereditary cancer panel  revealed variants of uncertain significance (VUS) of the APC, CDKN1C and FH genes.  Skin Rash The actual skin rash is minimal and mainly involving his scalp, but he has severe pruritus. He is currently on dexamethasone 4 mg daily   Plan Dr. Barbaraann Cao repeated a MRI of the brain last month and there was concern about possible  increased abnormality.  His PET scan of the brain on 10/23/2022 revealed radiation necrosis but not malignancy. His last CT of chest, abdomen, and pelvis was in January and I will plan repeat scans in July, at 6 months. Patient states that he feels ok and complains of facial weakness/numbness on his left side and still cannot completely close his eye, he has ringing of the left ear, and his right leg feels it is hot and prickly. He continues to take Dexamethasone 4mg  daily without difficulty. His day 1 cycle 12 of Nivolumab is scheduled on 11/18/2022. His labs today are pending. I will see him back in 4 weeks with  CBC, CMP, and get Nivolumab that following Tuesday. I will contact him to see if he would want to try medication for neuropathic pain. The patient understands the plans discussed today and is in agreement with them.  I have answered his questions and reviewed this information in detail.The patient knows to contact our office if he develops concerns prior to his next appointment.  I provided 20 minutes of face-to-face time during this this encounter and > 50% was spent counseling as documented under my assessment and plan.   Dellia Beckwith, MD  [facility] AT [facility] [address] [place] [address] Dept: [phone] Dept Fax: [phone]   No orders of the defined types were placed in this encounter.   CHIEF COMPLAINT:  CC: Metastatic melanoma  Current Treatment: Corticosteroids and brain irradiation, followed by immunotherapy  INTERVAL HISTORY:  Xavier Jones is here today for repeat clinical assessment of his metastatic melanoma. Dr. Barbaraann Cao repeated a MRI of the brain last month and there was concern about possible increased abnormality.  His PET scan of the brain on 10/23/2022 revealed radiation necrosis but not malignancy. His last CT of chest, abdomen, and pelvis was in January and I will plan repeat scans in July, at 6  months, unless he has signs or symptoms of worsening before then. Patient states that he feels ok and complains of facial weakness/numbness on his left side and still cannot completely close his eye, he has ringing of the left ear, and his right leg feels it is hot and prickly. He continues to take Dexamethasone 4mg  daily without difficulty. His day 1 cycle 12 of Nivolumab is scheduled on 11/18/2022. His labs today are pending. I will see him back in 4 weeks with  CBC, CMP, and get Nivolumab that following Tuesday. I will contact him to see if he would want to try medication for neuropathic pain such as pyridoxine or gabapentin. He denies signs of infection such as sore throat, sinus drainage, cough, or urinary symptoms.  He denies fevers or recurrent chills. He denies pain. He denies nausea, vomiting, chest pain, dyspnea or cough. His appetite is good and his weight has increased 10 pounds over last month .   I have reviewed the past medical history, past surgical history, social history and family history with the patient and they are unchanged from previous note. ALLERGIES:  is allergic to sulfa antibiotics and testosterone.  MEDICATIONS:  Current Outpatient Medications  Medication  Sig Dispense Refill   acebutolol (SECTRAL) 200 MG capsule Take 1 capsule (200 mg total) by mouth daily. 90 capsule 3   acyclovir (ZOVIRAX) 200 MG capsule Take by mouth.     amitriptyline (ELAVIL) 25 MG tablet Take 50 mg by mouth at bedtime.     dexamethasone (DECADRON) 1 MG tablet Take 3 tablets (3 mg total) by mouth daily with breakfast for 7 days, THEN 2 tablets (2 mg total) daily with breakfast for 7 days, THEN 1 tablet (1 mg total) daily with breakfast for 7 days. 42 tablet 0   ergocalciferol (VITAMIN D2) 1.25 MG (50000 UT) capsule Take 50,000 Units by mouth every Wednesday.     gabapentin (NEURONTIN) 300 MG capsule Take 1 capsule (300 mg total) by mouth at bedtime. 30 capsule 0   hydrOXYzine (ATARAX) 25 MG tablet  Take 1 tablet (25 mg total) by mouth 3 (three) times daily as needed for itching. 60 tablet 3   lidocaine-prilocaine (EMLA) cream Apply 1 Application topically as needed. 30 g 0   pentoxifylline (TRENTAL) 400 MG CR tablet Take 1 tablet (400 mg total) by mouth in the morning and at bedtime. 60 tablet 3   rivaroxaban (XARELTO) 20 MG TABS tablet Take 1 tablet (20 mg total) by mouth daily with supper. Start after completing 3 weeks of Xarelto 15 mg twice daily 30 tablet 3   vitamin E 180 MG (400 UNITS) capsule Take 1 capsule (400 Units total) by mouth daily. 30 capsule 2   No current facility-administered medications for this visit.    HISTORY OF PRESENT ILLNESS:   Oncology History  Melanoma of skin (HCC)  12/01/2019 Initial Diagnosis   Melanoma of skin (HCC)   12/05/2019 Cancer Staging   Staging form: Melanoma of the Skin, AJCC 8th Edition - Clinical stage from 12/05/2019: Stage IIA (cT3a, cN0, cM0) - Signed by Dellia Beckwith, MD on 05/30/2020   02/18/2021 Genetic Testing   No pathogenic variants detected in [facility] Multi-Cancer +RNA Panel.  Variants of uncertain significance detected in APC (c.681C>G (p.Asp227Glu)), CDKN1C (c.610C>G (p.Pro204Ala)), and FH (c.1151C>T (p.Ala384Val)).  The report date is February 18, 2021.    The Multi-Cancer + RNA Panel offered by [facility] includes sequencing and/or deletion/duplication analysis of the following 84 genes:  AIP*, ALK, APC*, ATM*, AXIN2*, BAP1*, BARD1*, BLM*, BMPR1A*, BRCA1*, BRCA2*, BRIP1*, CASR, CDC73*, CDH1*, CDK4, CDKN1B*, CDKN1C*, CDKN2A, CEBPA, CHEK2*, CTNNA1*, DICER1*, DIS3L2*, EGFR, EPCAM, FH*, FLCN*, GATA2*, GPC3, GREM1, HOXB13, HRAS, KIT, MAX*, MEN1*, MET, MITF, MLH1*, MSH2*, MSH3*, MSH6*, MUTYH*, NBN*, NF1*, NF2*, NTHL1*, PALB2*, PDGFRA, PHOX2B, PMS2*, POLD1*, POLE*, POT1*, PRKAR1A*, PTCH1*, PTEN*, RAD50*, RAD51C*, RAD51D*, RB1*, RECQL4, RET, RUNX1*, SDHA*, SDHAF2*, SDHB*, SDHC*, SDHD*, SMAD4*, SMARCA4*, SMARCB1*, SMARCE1*, STK11*, SUFU*,  TERC, TERT, TMEM127*, Tp53*, TSC1*, TSC2*, VHL*, WRN*, and WT1.  RNA analysis is performed for * genes.   01/30/2022 - 02/20/2022 Chemotherapy   Patient is on Treatment Plan : MELANOMA Nivolumab + Ipilimumab (1/3) q21d / Nivolumab q28d     01/30/2022 -  Chemotherapy   Patient is on Treatment Plan : MELANOMA Nivolumab (480) q28d     Malignant melanoma metastatic to brain (HCC)  12/19/2021 Initial Diagnosis   Malignant melanoma metastatic to brain (HCC)   01/30/2022 - 02/20/2022 Chemotherapy   Patient is on Treatment Plan : MELANOMA Nivolumab + Ipilimumab (1/3) q21d / Nivolumab q28d     01/30/2022 -  Chemotherapy   Patient is on Treatment Plan : MELANOMA Nivolumab (480) q28d     Malignant melanoma metastatic to  lung (HCC)  12/19/2021 Initial Diagnosis   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 Small nonocclusive segmental level pulmonary embolism in the  inferior LEFT upper lobe.  2. Marked bronchial wall thickening with areas of patchy basilar  opacities and new ground-glass opacities in the upper lobes.  Constellation of findings may be infectious bronchiolitis/bronchitis  though the possibility of drug related changes in the setting of  immunotherapy are also considered. Furthermore, given basilar  predominance and some material in basilar bronchial structures would  correlate with any risk factors for or history of aspiration.  3. Response to therapy of bilateral metastatic lesions. Some lymph  nodes in the chest with interval increase in size are equivocal at  this time, potentially reactive, but warrant attention on subsequent  imaging.    07/22/2022 Imaging   CT chest, abdomen and  pelvis:  IMPRESSION:  1. Interval positive response to therapy. Resolved subcarinal and  bilateral hilar lymphadenopathy. Stable to decreased left pulmonary  nodules. No new or progressive metastatic disease in the chest,  abdomen or pelvis.  2. One vessel coronary atherosclerosis.  3. Aortic Atherosclerosis (ICD10-I70.0).    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 Interval positive response to therapy. Resolved subcarinal and  bilateral hilar lymphadenopathy. Stable to decreased left pulmonary  nodules. No new or progressive metastatic disease in the chest,  abdomen or pelvis.  2. One vessel coronary atherosclerosis.  3. Aortic Atherosclerosis (ICD10-I70.0).        REVIEW OF SYSTEMS:   Review of Systems  Constitutional: Negative.  Negative for appetite change, chills, diaphoresis, fatigue, fever and unexpected weight change.  HENT:   Positive for tinnitus. Negative for hearing loss, lump/mass, mouth sores, nosebleeds, sore throat, trouble swallowing and voice change.        Facial numbness of the left side  Eyes:  Positive for eye problems (cant close left eye). Negative for icterus.       States his vision are blurry  Respiratory: Negative.  Negative for chest tightness, cough, hemoptysis, shortness of breath and wheezing.   Cardiovascular: Negative.  Negative for chest pain, leg swelling and palpitations.  Gastrointestinal: Negative.  Negative for abdominal distention, abdominal pain, blood in stool, constipation, diarrhea, nausea, rectal pain and vomiting.  Endocrine: Negative.  Negative for hot  flashes.  Genitourinary: Negative.  Negative for bladder incontinence, difficulty urinating, dyspareunia, dysuria, frequency, hematuria, nocturia, pelvic pain and penile discharge.   Musculoskeletal: Negative.  Negative for arthralgias, back pain, flank pain, gait problem, myalgias, neck pain and neck stiffness.       Right leg feels hot and prickly.   Skin: Negative.  Negative for itching, rash and wound.  Neurological:  Positive for dizziness, headaches (comes in waves) and speech difficulty. Negative for  extremity weakness, gait problem, light-headedness, numbness and seizures.  Hematological: Negative.  Negative for adenopathy. Does not bruise/bleed easily.  Psychiatric/Behavioral: Negative.  Negative for confusion, decreased concentration, depression, sleep disturbance and suicidal ideas. The patient is not nervous/anxious.    Marland Kitchen   VITALS:  Blood pressure (!) 136/97, pulse 86, temperature 98.5 F (36.9 C), temperature source Oral, resp. rate 16, height 5\' 9"  (1.753 m), weight 174 lb 3.2 oz (79 kg), SpO2 99 %.  Wt Readings from Last 3 Encounters:  11/24/22 176 lb 12.8 oz (80.2 kg)  11/18/22 174 lb 4 oz (79 kg)  11/13/22 174 lb 3.2 oz (79 kg)    Body mass index is 25.72 kg/m.  Performance status (ECOG): 1 - Symptomatic but completely ambulatory  PHYSICAL EXAM:    Physical Exam Vitals and nursing note reviewed.  Constitutional:      General: He is not in acute distress.    Appearance: Normal appearance. He is normal weight. He is not ill-appearing, toxic-appearing or diaphoretic.  HENT:     Head: Normocephalic and atraumatic.     Right Ear: Tympanic membrane, ear canal and external ear normal. There is no impacted cerumen.     Left Ear: Tympanic membrane, ear canal and external ear normal. There is no impacted cerumen.     Nose: Nose normal. No congestion or rhinorrhea.     Mouth/Throat:     Mouth: Mucous membranes are moist.     Pharynx: Oropharynx is clear. No oropharyngeal  exudate or posterior oropharyngeal erythema.  Eyes:     General: No scleral icterus.       Right eye: No discharge.        Left eye: No discharge.     Extraocular Movements: Extraocular movements intact.     Conjunctiva/sclera: Conjunctivae normal.     Pupils: Pupils are equal, round, and reactive to light.  Neck:     Vascular: No carotid bruit.  Cardiovascular:     Rate and Rhythm: Normal rate and regular rhythm.     Pulses: Normal pulses.     Heart sounds: Normal heart sounds. No murmur heard.    No friction rub. No gallop.  Pulmonary:     Effort: Pulmonary effort is normal. No respiratory distress.     Breath sounds: Normal breath sounds. No stridor. No wheezing, rhonchi or rales.  Chest:     Chest wall: No tenderness.  Abdominal:     General: Bowel sounds are normal. There is no distension.     Palpations: Abdomen is soft. There is no hepatomegaly, splenomegaly or mass.     Tenderness: There is no abdominal tenderness. There is no right CVA tenderness, left CVA tenderness, guarding or rebound.     Hernia: No hernia is present.  Musculoskeletal:        General: No swelling, tenderness, deformity or signs of injury. Normal range of motion.     Cervical back: Normal range of motion and neck supple. No rigidity or tenderness.     Right lower leg: No edema.     Left lower leg: No edema.     Comments: Large deep scar in the right posterior shoulder that is well healed.   Lymphadenopathy:     Cervical: No cervical adenopathy.     Upper Body:     Right upper body: No supraclavicular or axillary adenopathy.     Left upper body: No supraclavicular or axillary adenopathy.     Lower Body: No right inguinal adenopathy. No  left inguinal adenopathy.  Skin:    General: Skin is warm and dry.     Coloration: Skin is not jaundiced.     Findings: No bruising, lesion or rash.  Neurological:     General: No focal deficit present.     Mental Status: He is alert and oriented to person, place,  and time. Mental status is at baseline.     Cranial Nerves: No cranial nerve deficit.     Sensory: No sensory deficit.     Motor: No weakness.     Coordination: Coordination normal.     Gait: Gait normal.     Deep Tendon Reflexes: Reflexes normal.  Psychiatric:        Mood and Affect: Mood normal.        Behavior: Behavior normal.        Thought Content: Thought content normal.        Judgment: Judgment normal.    LABORATORY DATA:  I have reviewed the data as listed    Component Value Date/Time   NA 139 11/13/2022 1426   NA 138 07/28/2022 0000   K 4.0 11/13/2022 1426   CL 104 11/13/2022 1426   CO2 25 11/13/2022 1426   GLUCOSE 86 11/13/2022 1426   BUN 24 (H) 11/13/2022 1426   BUN 13 07/28/2022 0000   CREATININE 0.94 11/13/2022 1426   CALCIUM 9.3 11/13/2022 1426   PROT 6.7 11/13/2022 1426   PROT 6.2 (A) 05/08/2022 0000   ALBUMIN 3.8 11/13/2022 1426   AST 22 11/13/2022 1426   ALT 21 11/13/2022 1426   ALKPHOS 48 11/13/2022 1426   BILITOT 0.7 11/13/2022 1426   GFRNONAA >60 11/13/2022 1426   No results found for: "SPEP", "UPEP"  Lab Results  Component Value Date   WBC 8.0 11/13/2022   NEUTROABS 5.7 11/13/2022   HGB 13.4 11/13/2022   HCT 41.0 11/13/2022   MCV 100.0 11/13/2022   PLT 354 11/13/2022     Chemistry      Component Value Date/Time   NA 139 11/13/2022 1426   NA 138 07/28/2022 0000   K 4.0 11/13/2022 1426   CL 104 11/13/2022 1426   CO2 25 11/13/2022 1426   BUN 24 (H) 11/13/2022 1426   BUN 13 07/28/2022 0000   CREATININE 0.94 11/13/2022 1426   GLU 93 07/28/2022 0000      Component Value Date/Time   CALCIUM 9.3 11/13/2022 1426   ALKPHOS 48 11/13/2022 1426   AST 22 11/13/2022 1426   ALT 21 11/13/2022 1426   BILITOT 0.7 11/13/2022 1426     Component Ref Range & Units 4 wk ago (10/16/22) 1 mo ago (09/18/22) 2 mo ago (08/21/22) 3 mo ago (07/24/22) 4 mo ago (06/26/22) 5 mo ago (06/12/22) 5 mo ago (06/05/22)  T4, Total 4.5 - 12.0 ug/dL 7.4 8.3 CM 7.4  CM 7.0 CM 7.2 CM 8.5 CM 7.3 CM     Component Ref Range & Units 4 wk ago (10/16/22) 1 mo ago (09/18/22) 2 mo ago (08/21/22) 3 mo ago (07/24/22) 4 mo ago (06/26/22) 5 mo ago (06/12/22) 5 mo ago (06/05/22)  TSH 0.350 - 4.500 uIU/mL 0.790 0.757 CM 0.761 CM 1.550 CM 1.581 CM 1.001 CM 0.805 CM      RADIOGRAPHIC STUDIES: I have personally reviewed the radiological images as listed and agreed with the findings in the report. MR BRAIN W WO CONTRAST  Result Date: 11/25/2022 CLINICAL DATA:  52 year old male with metastatic melanoma. History of 4 metastases at  presentation, left temporal craniotomy for tumor resection, and SRS last year. Left side facial weakness. New onset "whooshing and ringing" in the ears". Restaging. EXAM: MRI HEAD WITHOUT AND WITH CONTRAST TECHNIQUE: Multiplanar, multiecho pulse sequences of the brain and surrounding structures were obtained without and with intravenous contrast. CONTRAST:  7.5 mL V[employer] COMPARISON:  Previous brain MRI 09/25/2022 and earlier. FINDINGS: Brain: Left temporal lobe resection, treatment site with stable to minimally increased stellate postcontrast enhancement on series 14, image 83 compared to March, but less than demonstrated in December last year. Stable regional T2 and FLAIR hyperintensity with no mass effect there. Right inferior frontal gyrus nodular and rim enhancing metastasis measuring 14 mm is stable, slightly smaller enhancing component since December. Regional T2 and FLAIR hyperintensity remain stable. No significant regional mass effect. Tiny left inferior frontal gyrus enhancing metastasis at presentation remains resolved. Left brainstem nodular and rim enhancing mass centered at the junction of the left pons and middle cerebellar peduncle is 17-18 mm long axis now, versus up to 19 mm in March, remains slightly larger than on the December exam. Regional T2 and FLAIR hyperintensity appears slightly regressed since March, with decreased subtle mass  effect on the left 4th ventricle. T2/FLAIR now more resembles that in December. By SWI blood products at the metastatic sites have not significantly changed. No new metastasis is identified. No superimposed restricted diffusion to suggest acute infarction. No midline shift, ventriculomegaly, extra-axial collection or acute intracranial hemorrhage. Cervicomedullary junction and pituitary are within normal limits. No new signal abnormality in the brain, small area of nonspecific central left cerebellar FLAIR hyperintensity (series 10, image 16) without blood products or enhancement is stable since presentation. Postoperative dural thickening underlying the craniotomy site has decreased since December. No other dural thickening. Vascular: Major intracranial vascular flow voids are stable. Major dural venous sinuses are enhancing on series 14 and appear to be patent. Skull and upper cervical spine: Visualized bone marrow signal is within normal limits. Negative visible cervical spine and spinal cord. Sinuses/Orbits: Stable, negative. Other: Mastoids remain clear. Bilateral internal auditory structures, enhancement appears stable over this series of exams. Maintain T2 signal in the bilateral cochlea and vestibular structures. Stylomastoid foramina are symmetric and within normal limits. Grossly negative visible scalp and face. IMPRESSION: 1. No progression of metastatic disease. 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 Two other enhancing treatment sites remain stable. 2. No new metastatic disease or acute intracranial abnormality is identified. Electronically Signed   By: Odessa Fleming M.D.   On: 11/25/2022 13:12   EXAM: 09/25/2022 MRI HEAD WITHOUT AND WITH CONTRAST IMPRESSION: 1. Increased size of a hemorrhagic left pons/middle cerebellar peduncle lesion with increased surrounding edema, described above. 2. Similar versus slightly decreased size of a 13 x 10 mm  enhancing lesion in the medial right frontal lobe. 3. Mildly diminished intrinsic T1 hyperintensity of the left temporal resection site with linear enhancement along the lateral margin, probably postsurgical. Recommend continued attention on follow-up.       I,Jasmine M Lassiter,acting as a scribe for Dellia Beckwith, MD.,have documented all relevant documentation on the behalf of Dellia Beckwith, MD,as directed by  Dellia Beckwith, MD while in the presence of Dellia Beckwith, MD.

## 2022-11-14 ENCOUNTER — Telehealth: Payer: Self-pay | Admitting: Oncology

## 2022-11-14 LAB — T4: T4, Total: 7.7 ug/dL (ref 4.5–12.0)

## 2022-11-14 NOTE — Telephone Encounter (Signed)
Patient has been scheduled  Scheduling Message Entered by Gery Pray H on 11/13/2022 at  3:43 PM Priority: Routine EST PT 30  Department: [facility]  Provider:  Scheduling Notes:  RT 4 weeks with labs and then infusion the following Tues. Molli Knock for [employer])    RT 8 weeks (7/3) with labs & have CT chest, abd & pelvis 2 days prior to appt. (7/1)  & still infusion the following Tues. 7/9

## 2022-11-16 ENCOUNTER — Other Ambulatory Visit: Payer: Self-pay

## 2022-11-17 MED FILL — Nivolumab IV Soln 240 MG/24ML: INTRAVENOUS | Qty: 48 | Status: AC

## 2022-11-18 ENCOUNTER — Encounter: Payer: Self-pay | Admitting: Oncology

## 2022-11-18 ENCOUNTER — Inpatient Hospital Stay: Payer: Medicare Other

## 2022-11-18 VITALS — BP 134/88 | HR 82 | Temp 97.9°F | Resp 18 | Ht 69.0 in | Wt 174.2 lb

## 2022-11-18 DIAGNOSIS — C7800 Secondary malignant neoplasm of unspecified lung: Secondary | ICD-10-CM

## 2022-11-18 DIAGNOSIS — C7802 Secondary malignant neoplasm of left lung: Secondary | ICD-10-CM | POA: Diagnosis not present

## 2022-11-18 DIAGNOSIS — C7931 Secondary malignant neoplasm of brain: Secondary | ICD-10-CM | POA: Diagnosis not present

## 2022-11-18 DIAGNOSIS — C7801 Secondary malignant neoplasm of right lung: Secondary | ICD-10-CM | POA: Diagnosis not present

## 2022-11-18 DIAGNOSIS — C787 Secondary malignant neoplasm of liver and intrahepatic bile duct: Secondary | ICD-10-CM | POA: Diagnosis not present

## 2022-11-18 DIAGNOSIS — C439 Malignant melanoma of skin, unspecified: Secondary | ICD-10-CM

## 2022-11-18 DIAGNOSIS — C4361 Malignant melanoma of right upper limb, including shoulder: Secondary | ICD-10-CM | POA: Diagnosis not present

## 2022-11-18 DIAGNOSIS — Z5112 Encounter for antineoplastic immunotherapy: Secondary | ICD-10-CM | POA: Diagnosis not present

## 2022-11-18 MED ORDER — SODIUM CHLORIDE 0.9 % IV SOLN: Freq: Once | INTRAVENOUS | Status: AC

## 2022-11-18 MED ORDER — SODIUM CHLORIDE 0.9 % IV SOLN
480.0000 mg | Freq: Once | INTRAVENOUS | Status: AC
  Administered 2022-11-18: 480 mg via INTRAVENOUS
  Filled 2022-11-18: qty 48

## 2022-11-18 MED ORDER — SODIUM CHLORIDE 0.9% FLUSH
10.0000 mL | INTRAVENOUS | Status: DC | PRN
  Administered 2022-11-18: 10 mL

## 2022-11-18 MED ORDER — HEPARIN SOD (PORK) LOCK FLUSH 100 UNIT/ML IV SOLN
500.0000 [IU] | Freq: Once | INTRAVENOUS | Status: AC | PRN
  Administered 2022-11-18: 500 [IU]

## 2022-11-18 NOTE — Patient Instructions (Signed)
[facility]  Discharge Instructions: Thank you for choosing [facility] to provide your oncology and hematology care.  If you have a lab appointment with the [facility], please go directly to the [facility] and check in at the registration area.   Wear comfortable clothing and clothing appropriate for easy access to any Portacath or PICC line.   We strive to give you quality time with your provider. You may need to reschedule your appointment if you arrive late (15 or more minutes).  Arriving late affects you and other patients whose appointments are after yours.  Also, if you miss three or more appointments without notifying the office, you may be dismissed from the clinic at the provider's discretion.      For prescription refill requests, have your pharmacy contact our office and allow 72 hours for refills to be completed.    Today you received the following chemotherapy and/or immunotherapy agents NIVOLUMABNivolumab Injection What is this medication? NIVOLUMAB (nye VOL ue mab) treats some types of cancer. It works by helping your immune system slow or stop the spread of cancer cells. It is a monoclonal antibody. This medicine may be used for other purposes; ask your health care provider or pharmacist if you have questions. COMMON BRAND NAME(S): Opdivo What should I tell my care team before I take this medication? They need to know if you have any of these conditions: Allogeneic stem cell transplant (uses someone else's stem cells) Autoimmune diseases, such as Crohn disease, ulcerative colitis, lupus History of chest radiation Nervous system problems, such as Guillain-Barre syndrome or myasthenia gravis Organ transplant An unusual or allergic reaction to nivolumab, other medications, foods, dyes, or preservatives Pregnant or trying to get pregnant Breast-feeding How should I use this medication? This medication is infused into a vein. It is  given in a hospital or clinic setting. A special MedGuide will be given to you before each treatment. Be sure to read this information carefully each time. Talk to your care team about the use of this medication in children. While it may be prescribed for children as young as 12 years for selected conditions, precautions do apply. Overdosage: If you think you have taken too much of this medicine contact a poison control center or emergency room at once. NOTE: This medicine is only for you. Do not share this medicine with others. What if I miss a dose? Keep appointments for follow-up doses. It is important not to miss your dose. Call your care team if you are unable to keep an appointment. What may interact with this medication? Interactions have not been studied. This list may not describe all possible interactions. Give your health care provider a list of all the medicines, herbs, non-prescription drugs, or dietary supplements you use. Also tell them if you smoke, drink alcohol, or use illegal drugs. Some items may interact with your medicine. What should I watch for while using this medication? Your condition will be monitored carefully while you are receiving this medication. You may need blood work while taking this medication. This medication may cause serious skin reactions. They can happen weeks to months after starting the medication. Contact your care team right away if you notice fevers or flu-like symptoms with a rash. The rash may be red or purple and then turn into blisters or peeling of the skin. You may also notice a red rash with swelling of the face, lips, or lymph nodes in your neck or  under your arms. Tell your care team right away if you have any change in your eyesight. Talk to your care team if you are pregnant or think you might be pregnant. A negative pregnancy test is required before starting this medication. A reliable form of contraception is recommended while taking this  medication and for 5 months after the last dose. Talk to your care team about effective forms of contraception. Do not breast-feed while taking this medication and for 5 months after the last dose. What side effects may I notice from receiving this medication? Side effects that you should report to your care team as soon as possible: Allergic reactions--skin rash, itching, hives, swelling of the face, lips, tongue, or throat Dry cough, shortness of breath or trouble breathing Eye pain, redness, irritation, or discharge with blurry or decreased vision Heart muscle inflammation--unusual weakness or fatigue, shortness of breath, chest pain, fast or irregular heartbeat, dizziness, swelling of the ankles, feet, or hands Hormone gland problems--headache, sensitivity to light, unusual weakness or fatigue, dizziness, fast or irregular heartbeat, increased sensitivity to cold or heat, excessive sweating, constipation, hair loss, increased thirst or amount of urine, tremors or shaking, irritability Infusion reactions--chest pain, shortness of breath or trouble breathing, feeling faint or lightheaded Kidney injury (glomerulonephritis)--decrease in the amount of urine, red or dark brown urine, foamy or bubbly urine, swelling of the ankles, hands, or feet Liver injury--right upper belly pain, loss of appetite, nausea, light-colored stool, dark yellow or brown urine, yellowing skin or eyes, unusual weakness or fatigue Pain, tingling, or numbness in the hands or feet, muscle weakness, change in vision, confusion or trouble speaking, loss of balance or coordination, trouble walking, seizures Rash, fever, and swollen lymph nodes Redness, blistering, peeling, or loosening of the skin, including inside the mouth Sudden or severe stomach pain, bloody diarrhea, fever, nausea, vomiting Side effects that usually do not require medical attention (report these to your care team if they continue or are bothersome): Bone,  joint, or muscle pain Diarrhea Fatigue Loss of appetite Nausea Skin rash This list may not describe all possible side effects. Call your doctor for medical advice about side effects. You may report side effects to FDA at 1-800-FDA-1088. Where should I keep my medication? This medication is given in a hospital or clinic. It will not be stored at home. NOTE: This sheet is a summary. It may not cover all possible information. If you have questions about this medicine, talk to your doctor, pharmacist, or health care provider.  2023 Elsevier/Gold Standard (2014-05-15 00:00:00)       To help prevent nausea and vomiting after your treatment, we encourage you to take your nausea medication as directed.  BELOW ARE SYMPTOMS THAT SHOULD BE REPORTED IMMEDIATELY: *FEVER GREATER THAN 100.4 F (38 C) OR HIGHER *CHILLS OR SWEATING *NAUSEA AND VOMITING THAT IS NOT CONTROLLED WITH YOUR NAUSEA MEDICATION *UNUSUAL SHORTNESS OF BREATH *UNUSUAL BRUISING OR BLEEDING *URINARY PROBLEMS (pain or burning when urinating, or frequent urination) *BOWEL PROBLEMS (unusual diarrhea, constipation, pain near the anus) TENDERNESS IN MOUTH AND THROAT WITH OR WITHOUT PRESENCE OF ULCERS (sore throat, sores in mouth, or a toothache) UNUSUAL RASH, SWELLING OR PAIN  UNUSUAL VAGINAL DISCHARGE OR ITCHING   Items with * indicate a potential emergency and should be followed up as soon as possible or go to the Emergency Department if any problems should occur.  Please show the CHEMOTHERAPY ALERT CARD or IMMUNOTHERAPY ALERT CARD at check-in to the Emergency Department and triage nurse.  Should you have questions after your visit or need to cancel or reschedule your appointment, please contact [facility]  Dept: [phone]  and follow the prompts.  Office hours are 8:00 a.m. to 4:30 p.m. Monday - Friday. Please note that voicemails left after 4:00 p.m. may not be returned until the following business day.   We are closed weekends and major holidays. You have access to a nurse at all times for urgent questions. Please call the main number to the clinic Dept: [phone] and follow the prompts.  For any non-urgent questions, you may also contact your provider using MyChart. We now offer e-Visits for anyone 18 and older to request care online for non-urgent symptoms. For details visit [email or web address].   Also download the MyChart app! Go to the app store, search "MyChart", open the app, select [facility], and log in with your MyChart username and password.   

## 2022-11-18 NOTE — Progress Notes (Signed)
Discharged at [phone] today.

## 2022-11-19 ENCOUNTER — Encounter: Payer: Self-pay | Admitting: Oncology

## 2022-11-19 ENCOUNTER — Ambulatory Visit
Admission: RE | Admit: 2022-11-19 | Discharge: 2022-11-19 | Disposition: A | Discharge status: Home / Self Care | Payer: Medicare Other | Source: Ambulatory Visit | Attending: Internal Medicine | Admitting: Internal Medicine

## 2022-11-19 DIAGNOSIS — C7931 Secondary malignant neoplasm of brain: Secondary | ICD-10-CM | POA: Diagnosis not present

## 2022-11-19 MED ORDER — GADOPICLENOL 0.5 MMOL/ML IV SOLN
7.5000 mL | Freq: Once | INTRAVENOUS | Status: AC | PRN
  Administered 2022-11-19: 7.5 mL via INTRAVENOUS

## 2022-11-20 ENCOUNTER — Other Ambulatory Visit: Payer: Self-pay | Admitting: Hematology and Oncology

## 2022-11-20 ENCOUNTER — Telehealth: Payer: Self-pay

## 2022-11-20 ENCOUNTER — Encounter: Payer: Self-pay | Admitting: Oncology

## 2022-11-20 MED ORDER — GABAPENTIN 300 MG PO CAPS: 300.0000 mg | ORAL_CAPSULE | Freq: Every day | ORAL | 0 refills | Status: DC

## 2022-11-20 NOTE — Telephone Encounter (Signed)
Pt states that "Xavier Jones mentioned something to me about medication for neuropathy. I haven't heard anything".

## 2022-11-24 ENCOUNTER — Other Ambulatory Visit: Payer: Self-pay | Admitting: *Deleted

## 2022-11-24 ENCOUNTER — Inpatient Hospital Stay: Payer: Medicare Other | Attending: Internal Medicine | Admitting: Internal Medicine

## 2022-11-24 VITALS — BP 135/95 | HR 80 | Temp 98.6°F | Resp 17 | Ht 69.0 in | Wt 176.8 lb

## 2022-11-24 DIAGNOSIS — C4361 Malignant melanoma of right upper limb, including shoulder: Secondary | ICD-10-CM | POA: Insufficient documentation

## 2022-11-24 DIAGNOSIS — Z8042 Family history of malignant neoplasm of prostate: Secondary | ICD-10-CM | POA: Insufficient documentation

## 2022-11-24 DIAGNOSIS — C7800 Secondary malignant neoplasm of unspecified lung: Secondary | ICD-10-CM | POA: Diagnosis not present

## 2022-11-24 DIAGNOSIS — Z800 Family history of malignant neoplasm of digestive organs: Secondary | ICD-10-CM | POA: Diagnosis not present

## 2022-11-24 DIAGNOSIS — C787 Secondary malignant neoplasm of liver and intrahepatic bile duct: Secondary | ICD-10-CM | POA: Insufficient documentation

## 2022-11-24 DIAGNOSIS — Z87891 Personal history of nicotine dependence: Secondary | ICD-10-CM | POA: Diagnosis not present

## 2022-11-24 DIAGNOSIS — C7931 Secondary malignant neoplasm of brain: Secondary | ICD-10-CM | POA: Diagnosis not present

## 2022-11-24 DIAGNOSIS — Z803 Family history of malignant neoplasm of breast: Secondary | ICD-10-CM | POA: Diagnosis not present

## 2022-11-24 MED ORDER — DEXAMETHASONE 1 MG PO TABS: ORAL_TABLET | ORAL | 0 refills | Status: AC

## 2022-11-24 NOTE — Progress Notes (Signed)
[facility] [address]  [place], [place] [address] [phone]   Interval Evaluation  Date of Service: 11/24/22 Patient Name: Xavier Jones Patient MRN: [id number] Patient DOB: [birth year] Provider: Henreitta Leber, MD  Identifying Statement:  Xavier Jones is a 52 y.o. 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  Variants of uncertain significance detected in APC (c.681C>G (p.Asp227Glu)), CDKN1C (c.610C>G (p.Pro204Ala)), and FH (c.1151C>T (p.Ala384Val)).  The report date is February 18, 2021.    The Multi-Cancer + RNA Panel offered by [facility] includes sequencing and/or deletion/duplication analysis of the following 84 genes:  AIP*, ALK, APC*, ATM*, AXIN2*, BAP1*, BARD1*, BLM*, BMPR1A*, BRCA1*, BRCA2*, BRIP1*, CASR, CDC73*, CDH1*, CDK4, CDKN1B*, CDKN1C*, CDKN2A, CEBPA, CHEK2*, CTNNA1*, DICER1*, DIS3L2*, EGFR, EPCAM, FH*, FLCN*, GATA2*, GPC3, GREM1, HOXB13, HRAS, KIT, MAX*, MEN1*, MET, MITF, MLH1*, MSH2*, MSH3*, MSH6*, MUTYH*, NBN*, NF1*, NF2*, NTHL1*, PALB2*, PDGFRA, PHOX2B, PMS2*, POLD1*, POLE*, POT1*, PRKAR1A*, PTCH1*, PTEN*, RAD50*, RAD51C*, RAD51D*, RB1*, RECQL4, RET, RUNX1*, SDHA*, SDHAF2*, SDHB*, SDHC*, SDHD*, SMAD4*, SMARCA4*, SMARCB1*, SMARCE1*, STK11*, SUFU*, TERC, TERT, TMEM127*, Tp53*, TSC1*, TSC2*, VHL*, WRN*, and WT1.  RNA analysis is performed for * genes.   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 Small nonocclusive segmental level pulmonary embolism in the  inferior LEFT upper lobe.  2. Marked bronchial wall thickening with areas of patchy basilar  opacities and new ground-glass opacities in the upper lobes.  Constellation of findings may be infectious bronchiolitis/bronchitis  though the possibility of drug related changes in the setting of  immunotherapy are also considered. Furthermore, given basilar  predominance and some material in basilar bronchial structures would  correlate with any risk factors for or history of aspiration.  3. Response to therapy of bilateral metastatic lesions. Some lymph  nodes in the chest with interval increase in size are equivocal at  this time, potentially reactive, but warrant attention on subsequent  imaging.    07/22/2022 Imaging   CT chest, abdomen and pelvis:  IMPRESSION:  1.  Interval positive response to therapy. Resolved subcarinal and  bilateral hilar lymphadenopathy. Stable to decreased left pulmonary  nodules. No new or progressive metastatic disease in the chest,  abdomen or pelvis.  2. One  vessel coronary atherosclerosis.  3. Aortic Atherosclerosis (ICD10-I70.0).    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 Interval positive response to therapy. Resolved subcarinal and  bilateral hilar lymphadenopathy. Stable to decreased left pulmonary  nodules. No new or progressive metastatic disease in the chest,  abdomen or pelvis.  2. One vessel coronary atherosclerosis.  3. Aortic Atherosclerosis (ICD10-I70.0).     [facility] Oncologic History 01/02/22: SRS to 4 metastases, including pre-op L temporal Kathrynn Running) 01/03/22: Craniotomy, resection L temporal Conchita Paris)  Interval History: Xavier Jones presents for follow up after recent MRI brain.  His left sided facial weakness is unchanged for the most part, he has maybe recovered "a little twitching" in recent weeks.  He remains unable to close his eyelid.  He is using saline drops to keep the eye moist.  Also describes new onset "whooshing and ringing" in the ears, accompained by "amplified" hearing at times.  He otherwise maintains full use of right arm and leg.  Decadron dosing remains at 4mg  daily.  Continues on nivolumab with Dr. Gilman Buttner, most recently was infused last week.  H+P (03/31/22) Patient presents today after 3 month post-SRS and craniotomy follow up.  He developed left sided weakness, mainly facial droop, 1 week ago.  Denies arm or leg weakness during that time.  He was started on decadron, dosed 4mg  TID x3 days, then down to 4mg  daily thereafter.  His weakness has improved back to near baseline.  He has no other complaints today, no seizures, no issues  with gait.  Medications: Current Outpatient Medications on File Prior to Visit  Medication Sig Dispense Refill   gabapentin (NEURONTIN) 300 MG capsule Take 1 capsule (300 mg total) by mouth at bedtime. 30 capsule 0   acebutolol (SECTRAL) 200 MG capsule Take 1 capsule (200 mg total) by mouth daily. 90 capsule 3   acyclovir (ZOVIRAX) 200 MG capsule Take by mouth.     amitriptyline (ELAVIL) 25 MG tablet Take 50 mg by mouth at bedtime.     dexamethasone (DECADRON) 4 MG tablet Take 1 tablet (4 mg total) by mouth 3 (three) times daily. 90 tablet 1   ergocalciferol (VITAMIN D2) 1.25 MG (50000 UT) capsule Take 50,000 Units by mouth every Wednesday.     HYDROcodone-acetaminophen (NORCO/VICODIN) 5-325 MG tablet Take by mouth.     hydrOXYzine (ATARAX) 25 MG tablet Take 1 tablet (25 mg total) by mouth 3 (three) times daily as needed for itching. 60 tablet 3   lidocaine-prilocaine (EMLA) cream Apply 1 Application topically as needed. 30 g 0   pentoxifylline (TRENTAL) 400 MG CR tablet Take 1 tablet (400 mg total) by mouth in the morning and at bedtime. 60 tablet 3   rivaroxaban (XARELTO) 20 MG TABS tablet Take 1 tablet (20 mg total) by mouth daily with supper. Start after completing 3 weeks of Xarelto 15 mg twice daily 30  tablet 3   vitamin E 180 MG (400 UNITS) capsule Take 1 capsule (400 Units total) by mouth daily. 30 capsule 2   No current facility-administered medications on file prior to visit.    Allergies:  Allergies  Allergen Reactions   Sulfa Antibiotics Rash   Testosterone Itching    Gel   Past Medical History:  Past Medical History:  Diagnosis Date   Acute pulmonary embolism without acute cor pulmonale (HCC) 05/01/2022   Anemia 05/20/2022   Depression    mild   Family history of breast cancer 01/22/2021   Family history of prostate cancer 01/22/2021   Genital herpes    History of back injury 2003   Pruritic rash 02/18/2022   Skin cancer    melanoma   Past Surgical History:   Past Surgical History:  Procedure Laterality Date   APPLICATION OF CRANIAL NAVIGATION N/A 01/03/2022   Procedure: APPLICATION OF CRANIAL NAVIGATION;  Surgeon: Lisbeth Renshaw, MD;  Location: [facility] OR;  Service: Neurosurgery;  Laterality: N/A;   CRANIOTOMY Left 01/03/2022   Procedure: Stereotactic Left temporal craniotomy for resection of tumor;  Surgeon: Lisbeth Renshaw, MD;  Location: [facility] OR;  Service: Neurosurgery;  Laterality: Left;   MELANOMA EXCISION WITH SENTINEL LYMPH NODE BIOPSY Right 2021   SPINAL FUSION W/ LUQUE UNIT ROD  2003   Social History:  Social History   Socioeconomic History   Marital status: Divorced    Spouse name: Not on file   Number of children: 0   Years of education: Not on file   Highest education level: Not on file  Occupational History   Not on file  Tobacco Use   Smoking status: Former    Types: Cigarettes    Quit date: 1998    Years since quitting: 26.4   Smokeless tobacco: Never  Vaping Use   Vaping Use: Never used  Substance and Sexual Activity   Alcohol use: Yes    Alcohol/week: 14.0 standard drinks of alcohol    Types: 14 Cans of beer per week    Comment: 2-3 beers per evening   Drug use: Not Currently   Sexual activity: Not on file  Other Topics Concern   Not on file  Social History Narrative   Not on file   Social Determinants of Health   Financial Resource Strain: Not on file  Food Insecurity: Not on file  Transportation Needs: Not on file  Physical Activity: Not on file  Stress: Not on file  Social Connections: Not on file  Intimate Partner Violence: Not on file   Family History:  Family History  Problem Relation Age of Onset   Prostate cancer Father        dx 74s   Breast cancer Maternal Aunt        dx after 67; bilateral mastectomy   Stomach cancer Maternal Grandfather        dx 50s   Breast cancer Cousin 4       maternal male cousin; mets    Review of Systems: Constitutional: Doesn't report fevers, chills  or abnormal weight loss Eyes: Doesn't report blurriness of vision Ears, nose, mouth, throat, and face: Doesn't report sore throat Respiratory: Doesn't report cough, dyspnea or wheezes Cardiovascular: Doesn't report palpitation, chest discomfort  Gastrointestinal:  Doesn't report nausea, constipation, diarrhea GU: Doesn't report incontinence Skin: Doesn't report skin rashes Neurological: Per HPI Musculoskeletal: Doesn't report joint pain Behavioral/Psych: Doesn't report anxiety  Physical Exam: There were no vitals filed for this visit.  KPS: 80. General: Alert, cooperative, pleasant, in no acute distress Head: Normal EENT: No conjunctival injection or scleral icterus.  Lungs: Resp effort normal Cardiac: Regular rate Abdomen: Non-distended abdomen Skin: No rashes cyanosis or petechiae. Extremities: No clubbing or edema  Neurologic Exam: Mental Status: Awake, alert, attentive to examiner. Oriented to self and environment. Language is fluent with intact comprehension.  Cranial Nerves: Visual acuity is grossly normal. Visual fields are full. Extra-ocular movements intact. L LMN facial paresis, dense Motor: Tone and bulk are normal. Power is full in both arms and legs. Reflexes are symmetric, no pathologic reflexes present.  Sensory: Intact to light touch Gait: Normal.   Labs: I have reviewed the data as listed    Component Value Date/Time   NA 139 11/13/2022 1426   NA 138 07/28/2022 0000   K 4.0 11/13/2022 1426   CL 104 11/13/2022 1426   CO2 25 11/13/2022 1426   GLUCOSE 86 11/13/2022 1426   BUN 24 (H) 11/13/2022 1426   BUN 13 07/28/2022 0000   CREATININE 0.94 11/13/2022 1426   CALCIUM 9.3 11/13/2022 1426   PROT 6.7 11/13/2022 1426   PROT 6.2 (A) 05/08/2022 0000   ALBUMIN 3.8 11/13/2022 1426   AST 22 11/13/2022 1426   ALT 21 11/13/2022 1426   ALKPHOS 48 11/13/2022 1426   BILITOT 0.7 11/13/2022 1426   GFRNONAA >60 11/13/2022 1426   Lab Results  Component Value Date    WBC 8.0 11/13/2022   NEUTROABS 5.7 11/13/2022   HGB 13.4 11/13/2022   HCT 41.0 11/13/2022   MCV 100.0 11/13/2022   PLT 354 11/13/2022    Imaging:  No results found.  [facility] Clinician Interpretation: I have personally reviewed the radiological images as listed.  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  MRI brain actually demonstrates stable findings, even slight contraction of enhancing volume, particularly evident on coronal post-contrast sequences.   Audiological symptoms could be central in nature, given proximity of CNVIII nucleus to treated and inflamed brainstem.    Did recommend further evaluation with ENT as well, he is agreeable with this.  We recommended decreasing decadron to 3mg  daily x7 days, then by 1mg  daily each week until discontinued.   We appreciate the opportunity to participate in the care of Xavier Jones.   We ask that Xavier Jones return to clinic in 3 months following next brain MRI, or sooner as needed. \ He will continue to follow with Dr. Gilman Buttner for nivolumab  All questions were answered. The patient knows to call the clinic with any problems, questions or concerns. No barriers to learning were detected.  The total time spent in the encounter was 40 minutes and more than 50% was on counseling and review of test results   Henreitta Leber, MD Medical Director of Neuro-Oncology [facility] 11/24/22 8:59 AM

## 2022-11-25 ENCOUNTER — Other Ambulatory Visit: Payer: Self-pay

## 2022-11-25 ENCOUNTER — Encounter: Payer: Self-pay | Admitting: Oncology

## 2022-11-26 ENCOUNTER — Encounter: Payer: Self-pay | Admitting: Oncology

## 2022-11-26 ENCOUNTER — Other Ambulatory Visit: Payer: Self-pay

## 2022-11-27 ENCOUNTER — Encounter: Payer: Self-pay | Admitting: Oncology

## 2022-11-28 ENCOUNTER — Encounter: Payer: Self-pay | Admitting: Oncology

## 2022-12-04 ENCOUNTER — Encounter: Payer: Self-pay | Admitting: Oncology

## 2022-12-05 ENCOUNTER — Encounter: Payer: Self-pay | Admitting: Oncology

## 2022-12-08 ENCOUNTER — Encounter: Payer: Self-pay | Admitting: Oncology

## 2022-12-11 ENCOUNTER — Other Ambulatory Visit: Payer: Medicare Other

## 2022-12-11 ENCOUNTER — Inpatient Hospital Stay: Payer: Medicare Other

## 2022-12-11 ENCOUNTER — Inpatient Hospital Stay: Payer: Medicare Other | Attending: Hematology and Oncology | Admitting: Hematology and Oncology

## 2022-12-11 ENCOUNTER — Encounter: Payer: Self-pay | Admitting: Hematology and Oncology

## 2022-12-11 VITALS — BP 127/90 | HR 90 | Temp 99.4°F | Resp 18 | Ht 69.0 in | Wt 177.2 lb

## 2022-12-11 DIAGNOSIS — Z5112 Encounter for antineoplastic immunotherapy: Secondary | ICD-10-CM | POA: Insufficient documentation

## 2022-12-11 DIAGNOSIS — R32 Unspecified urinary incontinence: Secondary | ICD-10-CM

## 2022-12-11 DIAGNOSIS — Z79620 Long term (current) use of immunosuppressive biologic: Secondary | ICD-10-CM | POA: Insufficient documentation

## 2022-12-11 DIAGNOSIS — C7800 Secondary malignant neoplasm of unspecified lung: Secondary | ICD-10-CM | POA: Diagnosis not present

## 2022-12-11 DIAGNOSIS — C7801 Secondary malignant neoplasm of right lung: Secondary | ICD-10-CM | POA: Insufficient documentation

## 2022-12-11 DIAGNOSIS — C778 Secondary and unspecified malignant neoplasm of lymph nodes of multiple regions: Secondary | ICD-10-CM | POA: Insufficient documentation

## 2022-12-11 DIAGNOSIS — C439 Malignant melanoma of skin, unspecified: Secondary | ICD-10-CM

## 2022-12-11 DIAGNOSIS — C787 Secondary malignant neoplasm of liver and intrahepatic bile duct: Secondary | ICD-10-CM | POA: Diagnosis not present

## 2022-12-11 DIAGNOSIS — C4361 Malignant melanoma of right upper limb, including shoulder: Secondary | ICD-10-CM | POA: Diagnosis not present

## 2022-12-11 DIAGNOSIS — C7931 Secondary malignant neoplasm of brain: Secondary | ICD-10-CM

## 2022-12-11 DIAGNOSIS — R200 Anesthesia of skin: Secondary | ICD-10-CM

## 2022-12-11 DIAGNOSIS — C7802 Secondary malignant neoplasm of left lung: Secondary | ICD-10-CM | POA: Diagnosis not present

## 2022-12-11 LAB — CBC WITH DIFFERENTIAL (CANCER CENTER ONLY)
Abs Immature Granulocytes: 0.07 10*3/uL (ref 0.00–0.07)
Basophils Absolute: 0.1 10*3/uL (ref 0.0–0.1)
Basophils Relative: 1 %
Eosinophils Absolute: 0.1 10*3/uL (ref 0.0–0.5)
Eosinophils Relative: 1 %
HCT: 42.5 % (ref 39.0–52.0)
Hemoglobin: 13.8 g/dL (ref 13.0–17.0)
Immature Granulocytes: 1 %
Lymphocytes Relative: 16 %
Lymphs Abs: 1.1 10*3/uL (ref 0.7–4.0)
MCH: 32.9 pg (ref 26.0–34.0)
MCHC: 32.5 g/dL (ref 30.0–36.0)
MCV: 101.2 fL — ABNORMAL HIGH (ref 80.0–100.0)
Monocytes Absolute: 0.6 10*3/uL (ref 0.1–1.0)
Monocytes Relative: 8 %
Neutro Abs: 5 10*3/uL (ref 1.7–7.7)
Neutrophils Relative %: 73 %
Platelet Count: 334 10*3/uL (ref 150–400)
RBC: 4.2 MIL/uL — ABNORMAL LOW (ref 4.22–5.81)
RDW: 12.6 % (ref 11.5–15.5)
WBC Count: 6.9 10*3/uL (ref 4.0–10.5)
nRBC: 0 % (ref 0.0–0.2)

## 2022-12-11 LAB — CMP (CANCER CENTER ONLY)
ALT: 16 U/L (ref 0–44)
AST: 19 U/L (ref 15–41)
Albumin: 3.7 g/dL (ref 3.5–5.0)
Alkaline Phosphatase: 43 U/L (ref 38–126)
Anion gap: 9 (ref 5–15)
BUN: 18 mg/dL (ref 6–20)
CO2: 28 mmol/L (ref 22–32)
Calcium: 9.3 mg/dL (ref 8.9–10.3)
Chloride: 102 mmol/L (ref 98–111)
Creatinine: 1.19 mg/dL (ref 0.61–1.24)
GFR, Estimated: >60 mL/min (ref >60)
Glucose, Bld: 88 mg/dL (ref 70–99)
Potassium: 4.2 mmol/L (ref 3.5–5.1)
Sodium: 139 mmol/L (ref 135–145)
Total Bilirubin: 0.6 mg/dL (ref 0.3–1.2)
Total Protein: 7.1 g/dL (ref 6.5–8.1)

## 2022-12-11 LAB — TSH: TSH: 1.371 u[IU]/mL (ref 0.350–4.500)

## 2022-12-11 NOTE — Progress Notes (Signed)
[facility]  [address] [place],  [place]  [address] [phone]  Clinic Day:  12/11/2022  Referring physician: Dellia Beckwith, MD  ASSESSMENT & PLAN:   Assessment & Plan: Melanoma of skin [facility]) History of stage IIB malignant melanoma of the right posterior shoulder, Breslow's depth 3.3 mm, Clark level IV, treated with wide local excision diagnosed in May 2021.  There was still a question as to whether this represented a metastatic lesion, rather than a primary, based on the presence of 2 nodules and no epidermal involvement.  MRI head and CT chest, abdomen and pelvis in June were negative for metastatic disease.   No adjuvant therapy was recommended.  PET scan in September was negative for metastatic disease at that time.  In June 2023, he was found to have widespread metastatic disease, including spread of his disease to the brain, liver, lungs subcutaneous tissue.  He was treated with ipilimumab/nivolumab for 4 cycles followed by on maintenance nivolumab.  Most recent CT imaging in January 2024 revealed a good response.    Clinically, he is doing fairly well.  He has severe neuropathy with tingling and numbness of the right lower extremity extending into the groin with intermittent urinary incontinence, which would be an unusual adverse effect of immunotherapy.  Therefore, I will obtain an MRI lumbar spine for further evaluation.  He will proceed with 13th cycle of nivolumab next week.  Will plan to see him back in 4 weeks with a CBC, comprehensive metabolic panel and CT chest, abdomen and pelvis to reassess his disease baseline prior to a 14th cycle.   Malignant melanoma metastatic to brain [facility]) MRI brain in June 2023 revealed 2.6 cm heterogeneous lesion of the left superior temporal gyrus with mild enhancement and extensive surrounding edema, an 8 mm cortical lesion of the inferomedial right frontal lobe with mild edema, a 2 mm mildly enhancing  cortical lesion of the inferolateral left frontal lobe and a intrinsically T1 hyperintense lesion of the left lateral pons measuring 7 mm with surrounding edema.  He had 4 lesions in total but 3 are less than 1 cm.  He was treated left temporal craniotomy with resection followed by stereotactic radiation to the 4 brain lesions in June 2023.   MRI brain in March revealed increased size of a hemorrhagic left pons/middle cerebellar peduncle lesion with increased surrounding edema, similar versus slightly decreased size of a 13 x 10 mm enhancing lesion in the medial right frontal lobe, and mildly diminished intrinsic T1 hyperintensity of the left temporal resection site with linear enhancement along the lateral margin, probably postsurgical/post radiation. PET brain scan in April showed no increased metabolic activity on the PET data set to correspond to the enhancing lesion in the LEFT brainstem. There was photopenia centrally within the lesion suggesting necrosis.  There was no increased peripheral metabolic activity to correspond to the enhancing rim on coregistered MRI.  These findings favor radiation necrosis over tumor recurrence. MRI brain in May did not reveal any progression of metastatic disease. 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 Two other enhancing treatment sites remained stable.  No new metastatic disease or acute intracranial abnormality was identified.  He has been on dexamethasone on and off since the fall due to the left facial weakness.  Dr. Barbaraann Cao saw him after his MRI and is tapering his dexamethasone again at this time.  Malignant melanoma metastatic to lung [facility]) Recurrent metastatic melanoma  diagnosed in June 2023.  Bilateral hypermetabolic pulmonary nodules were seen on PET, the largest measuring 2.3 cm, which had increased in size compared with the CT chest performed in May.  Has had a good response to therapy.  The most  recent CT chest, abdomen and pelvis in January showed resolution of the subcarinal and hilar lymphadenopathy with stable to decreased left pulmonary nodules.  There was no evidence of new or progressive metastatic disease.    The patient understands the plans discussed today and is in agreement with them.  He knows to contact our office if he develops concerns prior to his next appointment.   I provided 30 minutes of face-to-face time during this encounter and > 50% was spent counseling as documented under my assessment and plan.    Adah Perl, PA-C  [facility] AT [place] [address] [place] [address] Dept: [phone] Dept Fax: [phone]   Orders Placed This Encounter  Procedures  . MR Lumbar Spine W Contrast    Standing Status:   Future    Standing Expiration Date:   12/11/2023    Scheduling Instructions:     RH    Order Specific Question:   If indicated for the ordered procedure, I authorize the administration of contrast media per Radiology protocol    Answer:   Yes    Order Specific Question:   What is the patient's sedation requirement?    Answer:   No Sedation    Order Specific Question:   Does the patient have a pacemaker or implanted devices?    Answer:   No    Order Specific Question:   Preferred imaging location?    Answer:   External      CHIEF COMPLAINT:  CC: Metastatic melanoma  Current Treatment: Maintenance nivolumab every 4 weeks  HISTORY OF PRESENT ILLNESS:   Oncology History  Melanoma of skin (HCC)  12/01/2019 Initial Diagnosis   Melanoma of skin (HCC)   12/05/2019 Cancer Staging   Staging form: Melanoma of the Skin, AJCC 8th Edition - Clinical stage from 12/05/2019: Stage IIA (cT3a, cN0, cM0) - Signed by Dellia Beckwith, MD on 05/30/2020   02/18/2021 Genetic Testing   No pathogenic variants detected in [facility] Multi-Cancer +RNA Panel.  Variants of uncertain significance detected  in APC (c.681C>G (p.Asp227Glu)), CDKN1C (c.610C>G (p.Pro204Ala)), and FH (c.1151C>T (p.Ala384Val)).  The report date is February 18, 2021.    The Multi-Cancer + RNA Panel offered by [facility] includes sequencing and/or deletion/duplication analysis of the following 84 genes:  AIP*, ALK, APC*, ATM*, AXIN2*, BAP1*, BARD1*, BLM*, BMPR1A*, BRCA1*, BRCA2*, BRIP1*, CASR, CDC73*, CDH1*, CDK4, CDKN1B*, CDKN1C*, CDKN2A, CEBPA, CHEK2*, CTNNA1*, DICER1*, DIS3L2*, EGFR, EPCAM, FH*, FLCN*, GATA2*, GPC3, GREM1, HOXB13, HRAS, KIT, MAX*, MEN1*, MET, MITF, MLH1*, MSH2*, MSH3*, MSH6*, MUTYH*, NBN*, NF1*, NF2*, NTHL1*, PALB2*, PDGFRA, PHOX2B, PMS2*, POLD1*, POLE*, POT1*, PRKAR1A*, PTCH1*, PTEN*, RAD50*, RAD51C*, RAD51D*, RB1*, RECQL4, RET, RUNX1*, SDHA*, SDHAF2*, SDHB*, SDHC*, SDHD*, SMAD4*, SMARCA4*, SMARCB1*, SMARCE1*, STK11*, SUFU*, TERC, TERT, TMEM127*, Tp53*, TSC1*, TSC2*, VHL*, WRN*, and WT1.  RNA analysis is performed for * genes.   01/30/2022 - 02/20/2022 Chemotherapy   Patient is on Treatment Plan : MELANOMA Nivolumab + Ipilimumab (1/3) q21d / Nivolumab q28d     01/30/2022 -  Chemotherapy   Patient is on Treatment Plan : MELANOMA Nivolumab (480) q28d     Malignant melanoma metastatic to brain (HCC)  12/19/2021 Initial Diagnosis   Malignant melanoma metastatic to brain (  HCC)   01/30/2022 - 02/20/2022 Chemotherapy   Patient is on Treatment Plan : MELANOMA Nivolumab + Ipilimumab (1/3) q21d / Nivolumab q28d     01/30/2022 -  Chemotherapy   Patient is on Treatment Plan : MELANOMA Nivolumab (480) q28d     Malignant melanoma metastatic to lung (HCC)  12/19/2021 Initial Diagnosis   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 Small  nonocclusive segmental level pulmonary embolism in the  inferior LEFT upper lobe.  2. Marked bronchial wall thickening with areas of patchy basilar  opacities and new ground-glass opacities in the upper lobes.  Constellation of findings may be infectious bronchiolitis/bronchitis  though the possibility of drug related changes in the setting of  immunotherapy are also considered. Furthermore, given basilar  predominance and some material in basilar bronchial structures would  correlate with any risk factors for or history of aspiration.  3. Response to therapy of bilateral metastatic lesions. Some lymph  nodes in the chest with interval increase in size are equivocal at  this time, potentially reactive, but warrant attention on subsequent  imaging.    07/22/2022 Imaging   CT chest, abdomen and pelvis:  IMPRESSION:  1. Interval positive response to therapy. Resolved subcarinal and  bilateral hilar lymphadenopathy. Stable to decreased left pulmonary  nodules. No new or progressive metastatic disease in the chest,  abdomen or pelvis.  2. One vessel coronary atherosclerosis.  3. Aortic Atherosclerosis (ICD10-I70.0).    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 Interval positive response to therapy. Resolved subcarinal and  bilateral hilar lymphadenopathy. Stable to decreased left pulmonary  nodules. No new or progressive metastatic disease in the chest,  abdomen or pelvis.  2. One vessel coronary atherosclerosis.  3. Aortic Atherosclerosis (ICD10-I70.0).        INTERVAL HISTORY:  Philo is here today for  repeat clinical assessment prior to 13th cycle of nivolumab and states he is tolerating treatment fairly well.  He does have itching and mild rash after treatment, which is improving.  He has intermittent mild diarrhea.  He reports right leg neuropathy with tingling and numbness up to his groin with intermittent urinary incontinence, but no stool incontinence.  He denies focal weakness.  This has worsened over the past 2 months.  He has persistent left facial weakness, which has improved somewhat.  He states Dr. Barbaraann Cao has referred him to ENT for the changes in hearing in his left ear.  He denies fevers or chills. He denies pain. His appetite is good. His weight has increased 3 pounds over last month .  He continues to work full-time.  REVIEW OF SYSTEMS:  Review of Systems  Constitutional:  Positive for fatigue. Negative for appetite change, chills, fever and unexpected weight change.  HENT:  Negative for lump/mass, mouth sores and sore throat.   Eyes:  Positive for eye problems (Difficulty closing due to left facial weakness).  Respiratory:  Negative for cough and shortness of breath.   Cardiovascular:  Negative for chest pain and leg swelling.  Gastrointestinal:  Negative for abdominal pain, constipation, diarrhea, nausea and vomiting.  Genitourinary:  Negative for difficulty urinating, dysuria, frequency and hematuria.   Musculoskeletal:  Negative for arthralgias, back pain and myalgias.  Skin:  Negative for itching, rash and wound.  Neurological:  Positive for numbness (Numbness of the right lower extremity extending into the groin). Negative for dizziness, extremity weakness, headaches and light-headedness.  Hematological:  Negative for adenopathy.  Psychiatric/Behavioral:  Negative for depression and sleep disturbance. The patient is not nervous/anxious.      VITALS:  Blood pressure (!) 127/90, pulse 90, temperature 99.4 F (37.4 C), temperature source Oral, resp. rate 18, height 5\' 9"   (1.753 m), weight 177 lb 3.2 oz (80.4 kg), SpO2 96 %.  Wt Readings from Last 3 Encounters:  12/11/22 177 lb 3.2 oz (80.4 kg)  11/24/22 176 lb 12.8 oz (80.2 kg)  11/18/22 174 lb 4 oz (79 kg)    Body mass index is 26.17 kg/m.  Performance status (ECOG): 1 - Symptomatic but completely ambulatory  PHYSICAL EXAM:  Physical Exam Vitals and nursing note reviewed.  Constitutional:      General: He is not in acute distress.    Appearance: Normal appearance. He is normal weight.  HENT:     Head: Normocephalic and atraumatic.     Mouth/Throat:     Mouth: Mucous membranes are moist.     Pharynx: Oropharynx is clear. No oropharyngeal exudate or posterior oropharyngeal erythema.  Eyes:     General: No scleral icterus.    Extraocular Movements: Extraocular movements intact.     Conjunctiva/sclera: Conjunctivae normal.     Pupils: Pupils are equal, round, and reactive to light.  Cardiovascular:     Rate and Rhythm: Normal rate and regular rhythm.     Heart sounds: Normal heart sounds. No murmur heard.    No friction rub. No gallop.  Pulmonary:     Effort: Pulmonary effort is normal.     Breath sounds: Normal breath sounds. No wheezing, rhonchi or rales.  Abdominal:     General: Bowel sounds are normal. There is no distension.     Palpations: Abdomen is soft. There is no hepatomegaly, splenomegaly or mass.     Tenderness: There is no abdominal tenderness.  Musculoskeletal:        General: Normal range of motion.     Cervical back: Normal range of motion and neck supple. No tenderness.     Right lower leg: No edema.     Left lower leg: No edema.  Lymphadenopathy:     Cervical: No cervical adenopathy.     Upper Body:     Right upper body: No supraclavicular or axillary adenopathy.     Left upper body: No supraclavicular or axillary adenopathy.     Lower Body: No right inguinal adenopathy. No left inguinal adenopathy.  Skin:    General: Skin is warm and dry.     Coloration: Skin is not  jaundiced.     Findings: Rash (Mild scattered erythematous rash of the back) present.  Neurological:     Mental Status: He is alert and oriented to person, place, and time.     Cranial Nerves: Cranial nerve deficit and facial asymmetry present.  Sensory: Sensory deficit present.     Motor: Motor function is intact.     Comments: Persistent left facial droop with decreased sensation of the left face  Psychiatric:        Mood and Affect: Mood normal.        Behavior: Behavior normal.        Thought Content: Thought content normal.    LABS:      Latest Ref Rng & Units 12/11/2022    9:00 AM 11/13/2022    2:26 PM 10/16/2022    1:47 PM  CBC  WBC 4.0 - 10.5 K/uL 6.9  8.0  6.4   Hemoglobin 13.0 - 17.0 g/dL 78.2  95.6  21.3   Hematocrit 39.0 - 52.0 % 42.5  41.0  40.6   Platelets 150 - 400 K/uL 334  354  338       Latest Ref Rng & Units 12/11/2022    9:00 AM 11/13/2022    2:26 PM 10/16/2022    1:47 PM  CMP  Glucose 70 - 99 mg/dL 88  86  086   BUN 6 - 20 mg/dL 18  24  18    Creatinine 0.61 - 1.24 mg/dL 5.78  4.69  6.29   Sodium 135 - 145 mmol/L 139  139  138   Potassium 3.5 - 5.1 mmol/L 4.2  4.0  4.0   Chloride 98 - 111 mmol/L 102  104  105   CO2 22 - 32 mmol/L 28  25  25    Calcium 8.9 - 10.3 mg/dL 9.3  9.3  9.4   Total Protein 6.5 - 8.1 g/dL 7.1  6.7  6.9   Total Bilirubin 0.3 - 1.2 mg/dL 0.6  0.7  1.0   Alkaline Phos 38 - 126 U/L 43  48  49   AST 15 - 41 U/L 19  22  20    ALT 0 - 44 U/L 16  21  17       No results found for: "CEA1", "CEA" / No results found for: "CEA1", "CEA" No results found for: "PSA1" No results found for: "BMW413" No results found for: "CAN125"  No results found for: "TOTALPROTELP", "ALBUMINELP", "A1GS", "A2GS", "BETS", "BETA2SER", "GAMS", "MSPIKE", "SPEI" Lab Results  Component Value Date   TIBC 297 05/21/2022   FERRITIN 41 05/21/2022   IRONPCTSAT 18 05/21/2022   Lab Results  Component Value Date   LDH 139 05/21/2022   LDH 145 12/19/2021     STUDIES:  MR BRAIN W WO CONTRAST  Result Date: 11/25/2022 CLINICAL DATA:  52 year old male with metastatic melanoma. History of 4 metastases at presentation, left temporal craniotomy for tumor resection, and SRS last year. Left side facial weakness. New onset "whooshing and ringing" in the ears". Restaging. EXAM: MRI HEAD WITHOUT AND WITH CONTRAST TECHNIQUE: Multiplanar, multiecho pulse sequences of the brain and surrounding structures were obtained without and with intravenous contrast. CONTRAST:  7.5 mL Vueway COMPARISON:  Previous brain MRI 09/25/2022 and earlier. FINDINGS: Brain: Left temporal lobe resection, treatment site with stable to minimally increased stellate postcontrast enhancement on series 14, image 83 compared to March, but less than demonstrated in December last year. Stable regional T2 and FLAIR hyperintensity with no mass effect there. Right inferior frontal gyrus nodular and rim enhancing metastasis measuring 14 mm is stable, slightly smaller enhancing component since December. Regional T2 and FLAIR hyperintensity remain stable. No significant regional mass effect. Tiny left inferior frontal gyrus enhancing metastasis at presentation remains resolved. Left brainstem nodular  and rim enhancing mass centered at the junction of the left pons and middle cerebellar peduncle is 17-18 mm long axis now, versus up to 19 mm in March, remains slightly larger than on the December exam. Regional T2 and FLAIR hyperintensity appears slightly regressed since March, with decreased subtle mass effect on the left 4th ventricle. T2/FLAIR now more resembles that in December. By SWI blood products at the metastatic sites have not significantly changed. No new metastasis is identified. No superimposed restricted diffusion to suggest acute infarction. No midline shift, ventriculomegaly, extra-axial collection or acute intracranial hemorrhage. Cervicomedullary junction and pituitary are within normal limits. No  new signal abnormality in the brain, small area of nonspecific central left cerebellar FLAIR hyperintensity (series 10, image 16) without blood products or enhancement is stable since presentation. Postoperative dural thickening underlying the craniotomy site has decreased since December. No other dural thickening. Vascular: Major intracranial vascular flow voids are stable. Major dural venous sinuses are enhancing on series 14 and appear to be patent. Skull and upper cervical spine: Visualized bone marrow signal is within normal limits. Negative visible cervical spine and spinal cord. Sinuses/Orbits: Stable, negative. Other: Mastoids remain clear. Bilateral internal auditory structures, enhancement appears stable over this series of exams. Maintain T2 signal in the bilateral cochlea and vestibular structures. Stylomastoid foramina are symmetric and within normal limits. Grossly negative visible scalp and face. IMPRESSION: 1. No progression of metastatic disease. 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 Two other enhancing treatment sites remain stable. 2. No new metastatic disease or acute intracranial abnormality is identified. Electronically Signed   By: Odessa Fleming M.D.   On: 11/25/2022 13:12      HISTORY:   Past Medical History:  Diagnosis Date  . Acute pulmonary embolism without acute cor pulmonale (HCC) 05/01/2022  . Anemia 05/20/2022  . Depression    mild  . Family history of breast cancer 01/22/2021  . Family history of prostate cancer 01/22/2021  . Genital herpes   . History of back injury 2003  . Pruritic rash 02/18/2022  . Skin cancer    melanoma    Past Surgical History:  Procedure Laterality Date  . APPLICATION OF CRANIAL NAVIGATION N/A 01/03/2022   Procedure: APPLICATION OF CRANIAL NAVIGATION;  Surgeon: Lisbeth Renshaw, MD;  Location: [facility] OR;  Service: Neurosurgery;  Laterality: N/A;  . CRANIOTOMY Left 01/03/2022    Procedure: Stereotactic Left temporal craniotomy for resection of tumor;  Surgeon: Lisbeth Renshaw, MD;  Location: [facility] OR;  Service: Neurosurgery;  Laterality: Left;  Marland Kitchen MELANOMA EXCISION WITH SENTINEL LYMPH NODE BIOPSY Right 2021  . SPINAL FUSION W/ LUQUE UNIT ROD  2003    Family History  Problem Relation Age of Onset  . Prostate cancer Father        dx 60s  . Breast cancer Maternal Aunt        dx after 50; bilateral mastectomy  . Stomach cancer Maternal Grandfather        dx 44s  . Breast cancer Cousin 50       maternal male cousin; mets    Social History:  reports that he quit smoking about 26 years ago. His smoking use included cigarettes. He has never used smokeless tobacco. He reports current alcohol use of about 14.0 standard drinks of alcohol per week. He reports that he does not currently use drugs.The patient is alone today.  Allergies:  Allergies  Allergen Reactions  . Sulfa Antibiotics Rash  .  Testosterone Itching    Gel    Current Medications: Current Outpatient Medications  Medication Sig Dispense Refill  . rivaroxaban (XARELTO) 20 MG TABS tablet Take 1 tablet (20 mg total) by mouth daily with supper. Start after completing 3 weeks of Xarelto 15 mg twice daily 30 tablet 3  . acebutolol (SECTRAL) 200 MG capsule Take 1 capsule (200 mg total) by mouth daily. 90 capsule 3  . acyclovir (ZOVIRAX) 200 MG capsule Take by mouth.    Marland Kitchen amitriptyline (ELAVIL) 25 MG tablet Take 50 mg by mouth at bedtime.    Marland Kitchen dexamethasone (DECADRON) 1 MG tablet Take 3 tablets (3 mg total) by mouth daily with breakfast for 7 days, THEN 2 tablets (2 mg total) daily with breakfast for 7 days, THEN 1 tablet (1 mg total) daily with breakfast for 7 days. 42 tablet 0  . ergocalciferol (VITAMIN D2) 1.25 MG (50000 UT) capsule Take 50,000 Units by mouth every Wednesday.    . gabapentin (NEURONTIN) 300 MG capsule Take 1 capsule (300 mg total) by mouth at bedtime. 30 capsule 0  . hydrOXYzine (ATARAX) 25  MG tablet Take 1 tablet (25 mg total) by mouth 3 (three) times daily as needed for itching. 60 tablet 3  . lidocaine-prilocaine (EMLA) cream Apply 1 Application topically as needed. 30 g 0  . pentoxifylline (TRENTAL) 400 MG CR tablet Take 1 tablet (400 mg total) by mouth in the morning and at bedtime. 60 tablet 3  . vitamin E 180 MG (400 UNITS) capsule Take 1 capsule (400 Units total) by mouth daily. 30 capsule 2   No current facility-administered medications for this visit.

## 2022-12-11 NOTE — Assessment & Plan Note (Signed)
MRI brain in June 2023 revealed 2.6 cm heterogeneous lesion of the left superior temporal gyrus with mild enhancement and extensive surrounding edema, an 8 mm cortical lesion of the inferomedial right frontal lobe with mild edema, a 2 mm mildly enhancing cortical lesion of the inferolateral left frontal lobe and a intrinsically T1 hyperintense lesion of the left lateral pons measuring 7 mm with surrounding edema.  He had 4 lesions in total but 3 are less than 1 cm.  He was treated left temporal craniotomy with resection followed by stereotactic radiation to the 4 brain lesions in June 2023.   MRI brain in March revealed increased size of a hemorrhagic left pons/middle cerebellar peduncle lesion with increased surrounding edema, similar versus slightly decreased size of a 13 x 10 mm enhancing lesion in the medial right frontal lobe, and mildly diminished intrinsic T1 hyperintensity of the left temporal resection site with linear enhancement along the lateral margin, probably postsurgical/post radiation. PET brain scan in April showed no increased metabolic activity on the PET data set to correspond to the enhancing lesion in the LEFT brainstem. There was photopenia centrally within the lesion suggesting necrosis.  There was no increased peripheral metabolic activity to correspond to the enhancing rim on coregistered MRI.  These findings favor radiation necrosis over tumor recurrence. MRI brain in May did not reveal any progression of metastatic disease. 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 Two other enhancing treatment sites remained stable.  No new metastatic disease or acute intracranial abnormality was identified.  He has been on dexamethasone on and off since the fall due to the left facial weakness.  Dr. Barbaraann Cao saw him after his MRI and is tapering his dexamethasone again at this time.

## 2022-12-11 NOTE — Assessment & Plan Note (Addendum)
History of stage IIB malignant melanoma of the right posterior shoulder, Breslow's depth 3.3 mm, Clark level IV, treated with wide local excision diagnosed in May 2021.  There was still a question as to whether this represented a metastatic lesion, rather than a primary, based on the presence of 2 nodules and no epidermal involvement.  MRI head and CT chest, abdomen and pelvis in June were negative for metastatic disease.   No adjuvant therapy was recommended.  PET scan in September was negative for metastatic disease at that time.  In June 2023, he was found to have widespread metastatic disease, including spread of his disease to the brain, liver, lungs subcutaneous tissue.  He was treated with ipilimumab/nivolumab for 4 cycles followed by on maintenance nivolumab.  Most recent CT imaging in January 2024 revealed a good response.    Clinically, he is doing fairly well.  He has severe neuropathy with tingling and numbness of the right lower extremity extending into the groin with intermittent urinary incontinence, which would be an unusual adverse effect of immunotherapy.  Therefore, I will obtain an MRI lumbar spine for further evaluation.  He will proceed with 13th cycle of nivolumab next week.  Will plan to see him back in 4 weeks with a CBC, comprehensive metabolic panel and CT chest, abdomen and pelvis to reassess his disease baseline prior to a 14th cycle.

## 2022-12-11 NOTE — Assessment & Plan Note (Signed)
Recurrent metastatic melanoma diagnosed in June 2023.  Bilateral hypermetabolic pulmonary nodules were seen on PET, the largest measuring 2.3 cm, which had increased in size compared with the CT chest performed in May.  Has had a good response to therapy.  The most recent CT chest, abdomen and pelvis in January showed resolution of the subcarinal and hilar lymphadenopathy with stable to decreased left pulmonary nodules.  There was no evidence of new or progressive metastatic disease.

## 2022-12-12 ENCOUNTER — Encounter: Payer: Self-pay | Admitting: Oncology

## 2022-12-13 LAB — T4: T4, Total: 8.1 ug/dL (ref 4.5–12.0)

## 2022-12-15 ENCOUNTER — Encounter: Payer: Self-pay | Admitting: Oncology

## 2022-12-15 MED FILL — Nivolumab IV Soln 240 MG/24ML: INTRAVENOUS | Qty: 48 | Status: AC

## 2022-12-16 ENCOUNTER — Inpatient Hospital Stay: Payer: Medicare Other

## 2022-12-16 VITALS — BP 141/84 | HR 78 | Temp 98.1°F | Resp 14 | Ht 69.0 in | Wt 180.1 lb

## 2022-12-16 DIAGNOSIS — C7802 Secondary malignant neoplasm of left lung: Secondary | ICD-10-CM | POA: Diagnosis not present

## 2022-12-16 DIAGNOSIS — C7931 Secondary malignant neoplasm of brain: Secondary | ICD-10-CM

## 2022-12-16 DIAGNOSIS — C439 Malignant melanoma of skin, unspecified: Secondary | ICD-10-CM

## 2022-12-16 DIAGNOSIS — C787 Secondary malignant neoplasm of liver and intrahepatic bile duct: Secondary | ICD-10-CM

## 2022-12-16 DIAGNOSIS — C4361 Malignant melanoma of right upper limb, including shoulder: Secondary | ICD-10-CM | POA: Diagnosis not present

## 2022-12-16 DIAGNOSIS — C7800 Secondary malignant neoplasm of unspecified lung: Secondary | ICD-10-CM

## 2022-12-16 DIAGNOSIS — C7801 Secondary malignant neoplasm of right lung: Secondary | ICD-10-CM | POA: Diagnosis not present

## 2022-12-16 DIAGNOSIS — Z5112 Encounter for antineoplastic immunotherapy: Secondary | ICD-10-CM | POA: Diagnosis not present

## 2022-12-16 MED ORDER — SODIUM CHLORIDE 0.9% FLUSH
10.0000 mL | INTRAVENOUS | Status: DC | PRN
  Administered 2022-12-16: 10 mL

## 2022-12-16 MED ORDER — SODIUM CHLORIDE 0.9 % IV SOLN: Freq: Once | INTRAVENOUS | Status: AC

## 2022-12-16 MED ORDER — HEPARIN SOD (PORK) LOCK FLUSH 100 UNIT/ML IV SOLN
500.0000 [IU] | Freq: Once | INTRAVENOUS | Status: AC | PRN
  Administered 2022-12-16: 500 [IU]

## 2022-12-16 MED ORDER — SODIUM CHLORIDE 0.9 % IV SOLN
480.0000 mg | Freq: Once | INTRAVENOUS | Status: AC
  Administered 2022-12-16: 480 mg via INTRAVENOUS
  Filled 2022-12-16: qty 48

## 2022-12-16 NOTE — Patient Instructions (Signed)
[facility] [place]  Discharge Instructions: Thank you for choosing [facility] to provide your oncology and hematology care.  If you have a lab appointment with the Cancer Center, please go directly to the Cancer Center and check in at the registration area.   Wear comfortable clothing and clothing appropriate for easy access to any Portacath or PICC line.   We strive to give you quality time with your provider. You may need to reschedule your appointment if you arrive late (15 or more minutes).  Arriving late affects you and other patients whose appointments are after yours.  Also, if you miss three or more appointments without notifying the office, you may be dismissed from the clinic at the provider's discretion.      For prescription refill requests, have your pharmacy contact our office and allow 72 hours for refills to be completed.    Today you received the following chemotherapy and/or immunotherapy agents Opdivo      To help prevent nausea and vomiting after your treatment, we encourage you to take your nausea medication as directed.  BELOW ARE SYMPTOMS THAT SHOULD BE REPORTED IMMEDIATELY: *FEVER GREATER THAN 100.4 F (38 C) OR HIGHER *CHILLS OR SWEATING *NAUSEA AND VOMITING THAT IS NOT CONTROLLED WITH YOUR NAUSEA MEDICATION *UNUSUAL SHORTNESS OF BREATH *UNUSUAL BRUISING OR BLEEDING *URINARY PROBLEMS (pain or burning when urinating, or frequent urination) *BOWEL PROBLEMS (unusual diarrhea, constipation, pain near the anus) TENDERNESS IN MOUTH AND THROAT WITH OR WITHOUT PRESENCE OF ULCERS (sore throat, sores in mouth, or a toothache) UNUSUAL RASH, SWELLING OR PAIN  UNUSUAL VAGINAL DISCHARGE OR ITCHING   Items with * indicate a potential emergency and should be followed up as soon as possible or go to the Emergency Department if any problems should occur.  Please show the CHEMOTHERAPY ALERT CARD or IMMUNOTHERAPY ALERT CARD at check-in to the  Emergency Department and triage nurse.  Should you have questions after your visit or need to cancel or reschedule your appointment, please contact [facility] [place]  Dept: [phone]  and follow the prompts.  Office hours are 8:00 a.m. to 4:30 p.m. Monday - Friday. Please note that voicemails left after 4:00 p.m. may not be returned until the following business day.  We are closed weekends and major holidays. You have access to a nurse at all times for urgent questions. Please call the main number to the clinic Dept: [phone] and follow the prompts.  For any non-urgent questions, you may also contact your provider using MyChart. We now offer e-Visits for anyone 18 and older to request care online for non-urgent symptoms. For details visit [email or web address].   Also download the MyChart app! Go to the app store, search "MyChart", open the app, select , and log in with your MyChart username and password.   

## 2022-12-19 ENCOUNTER — Other Ambulatory Visit: Payer: Self-pay | Admitting: Hematology and Oncology

## 2022-12-19 DIAGNOSIS — G5791 Unspecified mononeuropathy of right lower limb: Secondary | ICD-10-CM

## 2022-12-19 DIAGNOSIS — L298 Other pruritus: Secondary | ICD-10-CM

## 2022-12-19 DIAGNOSIS — L282 Other prurigo: Secondary | ICD-10-CM

## 2022-12-19 DIAGNOSIS — R200 Anesthesia of skin: Secondary | ICD-10-CM

## 2022-12-22 ENCOUNTER — Encounter: Payer: Self-pay | Admitting: Oncology

## 2022-12-22 NOTE — Telephone Encounter (Signed)
Error

## 2022-12-23 ENCOUNTER — Telehealth: Payer: Self-pay | Admitting: Oncology

## 2022-12-23 NOTE — Telephone Encounter (Signed)
12/23/22 Spoke with patient and scheduled CT SCANS on 01/05/23 arrive at 830am-RH

## 2022-12-25 ENCOUNTER — Encounter: Payer: Self-pay | Admitting: Cardiology

## 2022-12-25 ENCOUNTER — Encounter: Payer: Self-pay | Admitting: Oncology

## 2022-12-25 ENCOUNTER — Ambulatory Visit: Payer: Medicare Other | Attending: Cardiology | Admitting: Cardiology

## 2022-12-25 VITALS — BP 118/70 | HR 97 | Ht 69.0 in | Wt 180.8 lb

## 2022-12-25 DIAGNOSIS — Z7901 Long term (current) use of anticoagulants: Secondary | ICD-10-CM

## 2022-12-25 DIAGNOSIS — R002 Palpitations: Secondary | ICD-10-CM

## 2022-12-25 DIAGNOSIS — R202 Paresthesia of skin: Secondary | ICD-10-CM | POA: Diagnosis not present

## 2022-12-25 DIAGNOSIS — R200 Anesthesia of skin: Secondary | ICD-10-CM | POA: Diagnosis not present

## 2022-12-25 NOTE — Progress Notes (Signed)
Cardiology Office Note:    Date:  12/25/2022   ID:  Xavier Jones, DOB Apr 24, 1971, MRN [id number]  PCP:  [name], NP   [facility] Providers Cardiologist:  None     Referring MD: [name], NP   CC: follow up palpitations  History of Present Illness:    Xavier Jones is a 52 y.o. male with a hx of metastatic melanoma, history of pulmonary embolism on chronic anticoagulation, palpitations.  He establish care at the behest of his PCP with Dr. Dulce Sellar on 08/12/2022 for evaluations of palpitations, his apple phone rhythm was reviewed and he was having PVCs and brief runs of APCs, felt to be related to TCA use however patient went to continue this medication.  He was placed on Sectral once daily and advised to follow-up in 6 months.  An echo was arranged and completed on 08/20/2022 revealing an EF of 55 to 60%, mild concentric LVH, grade 1 DD.  He presents today for follow-up of his palpitations.  States he has been doing relatively well since he was last seen in our office.  He feels like the beta-blocker has helped him, although he stated for about a week or so he noticed an increase in palpitations however they have since subsided. He denies chest pain, palpitations, dyspnea, pnd, orthopnea, n, v, dizziness, syncope, edema, weight gain, or early satiety.   Past Medical History:  Diagnosis Date   Acute pulmonary embolism without acute cor pulmonale (HCC) 05/01/2022   Anemia 05/20/2022   Depression    mild   Family history of breast cancer 01/22/2021   Family history of prostate cancer 01/22/2021   Genital herpes    History of back injury 2003   Pruritic rash 02/18/2022   Skin cancer    melanoma    Past Surgical History:  Procedure Laterality Date   APPLICATION OF CRANIAL NAVIGATION N/A 01/03/2022   Procedure: APPLICATION OF CRANIAL NAVIGATION;  Surgeon: Lisbeth Renshaw, MD;  Location: [facility] OR;  Service: Neurosurgery;  Laterality: N/A;   CRANIOTOMY Left  01/03/2022   Procedure: Stereotactic Left temporal craniotomy for resection of tumor;  Surgeon: Lisbeth Renshaw, MD;  Location: [facility] OR;  Service: Neurosurgery;  Laterality: Left;   MELANOMA EXCISION WITH SENTINEL LYMPH NODE BIOPSY Right 2021   SPINAL FUSION W/ LUQUE UNIT ROD  2003    Current Medications: Current Meds  Medication Sig   acebutolol (SECTRAL) 200 MG capsule Take 1 capsule (200 mg total) by mouth daily.   acyclovir (ZOVIRAX) 400 MG tablet Take 400 mg by mouth 3 (three) times daily.   amitriptyline (ELAVIL) 25 MG tablet Take 50 mg by mouth at bedtime.   ergocalciferol (VITAMIN D2) 1.25 MG (50000 UT) capsule Take 50,000 Units by mouth every Wednesday.   gabapentin (NEURONTIN) 300 MG capsule TAKE ONE CAPSULE BY MOUTH AT BEDTIME   hydrOXYzine (ATARAX) 25 MG tablet TAKE ONE TABLET BY MOUTH 3 TIMES A DAY AS NEEDED FOR ITCHING   lidocaine-prilocaine (EMLA) cream Apply 1 Application topically as needed.   pentoxifylline (TRENTAL) 400 MG CR tablet Take 1 tablet (400 mg total) by mouth in the morning and at bedtime.   rivaroxaban (XARELTO) 20 MG TABS tablet Take 1 tablet (20 mg total) by mouth daily with supper. Start after completing 3 weeks of Xarelto 15 mg twice daily   vitamin E 180 MG (400 UNITS) capsule Take 1 capsule (400 Units total) by mouth daily.     Allergies:   Sulfa  antibiotics and Testosterone   Social History   Socioeconomic History   Marital status: Divorced    Spouse name: Not on file   Number of children: 0   Years of education: Not on file   Highest education level: Not on file  Occupational History   Not on file  Tobacco Use   Smoking status: Former    Types: Cigarettes    Quit date: 1998    Years since quitting: 26.4   Smokeless tobacco: Never  Vaping Use   Vaping Use: Never used  Substance and Sexual Activity   Alcohol use: Yes    Alcohol/week: 14.0 standard drinks of alcohol    Types: 14 Cans of beer per week    Comment: 2-3 beers per evening    Drug use: Not Currently   Sexual activity: Not on file  Other Topics Concern   Not on file  Social History Narrative   Not on file   Social Determinants of Health   Financial Resource Strain: Not on file  Food Insecurity: Not on file  Transportation Needs: Not on file  Physical Activity: Not on file  Stress: Not on file  Social Connections: Not on file     Family History: The patient's family history includes Breast cancer in his maternal aunt; Breast cancer (age of onset: 93) in his cousin; Prostate cancer in his father; Stomach cancer in his maternal grandfather.  ROS:   Please see the history of present illness.     All other systems reviewed and are negative.  EKGs/Labs/Other Studies Reviewed:    The following studies were reviewed today: Cardiac Studies & Procedures       ECHOCARDIOGRAM  ECHOCARDIOGRAM COMPLETE 08/20/2022  Narrative ECHOCARDIOGRAM REPORT    [facility] Date of Exam: 08/20/2022 Medical Rec #:  [id number]         Height:       69.0 in Accession #:    [id number]        Weight:       167.8 lb Date of Birth:  04/26/1971         BSA:          1.917 m Patient Age:    51 years          BP:           122/70 mmHg Patient Gender: M                 HR:           76 bpm. Exam Location:  [place]  Procedure: 2D Echo, Color Doppler, Cardiac Doppler and Strain Analysis  Indications:    Palpitations [R00.2 (ICD-10-CM)]; Malignant melanoma metastatic to brain (HCC) [C79.31 (ICD-10-CM)]; Chronic anticoagulation [Z79.01 (ICD-10-CM)]; Malignant melanoma metastatic to lung (HCC) [C78.00 (ICD-10-CM)]; Malignant neoplasm of overlapping sites of heart, mediastinum and pleura (HCC) [C38.8 (ICD-10-CM)]  History:        Patient has no prior history of Echocardiogram examinations.  Sonographer:    Louie Boston RDCS Referring Phys: [id number] Iline Oven [facility]   Sonographer Comments: Restricted mobility due to clavicle fracture. IMPRESSIONS   1. Left  ventricular ejection fraction, by estimation, is 55 to 60%. The left ventricle has normal function. The left ventricle has no regional wall motion abnormalities. There is mild concentric left ventricular hypertrophy. Left ventricular diastolic parameters are consistent with Grade I diastolic dysfunction (impaired relaxation). 2. Right ventricular systolic function is normal. The right ventricular size is  normal. There is normal pulmonary artery systolic pressure. 3. The mitral valve is normal in structure. No evidence of mitral valve regurgitation. No evidence of mitral stenosis. 4. The aortic valve is tricuspid. Aortic valve regurgitation is not visualized. No aortic stenosis is present. 5. Aortic Normal DTA. There is borderline dilatation of the ascending aorta, measuring 37 mm. 6. The inferior vena cava is normal in size with greater than 50% respiratory variability, suggesting right atrial pressure of 3 mmHg.  FINDINGS Left Ventricle: Left ventricular ejection fraction, by estimation, is 55 to 60%. The left ventricle has normal function. The left ventricle has no regional wall motion abnormalities. The left ventricular internal cavity size was normal in size. There is mild concentric left ventricular hypertrophy. Left ventricular diastolic parameters are consistent with Grade I diastolic dysfunction (impaired relaxation). Normal left ventricular filling pressure.  Right Ventricle: The right ventricular size is normal. No increase in right ventricular wall thickness. Right ventricular systolic function is normal. There is normal pulmonary artery systolic pressure. The tricuspid regurgitant velocity is 1.24 m/s, and with an assumed right atrial pressure of 3 mmHg, the estimated right ventricular systolic pressure is 9.2 mmHg.  Left Atrium: Left atrial size was normal in size.  Right Atrium: Right atrial size was normal in size.  Pericardium: There is no evidence of pericardial  effusion.  Mitral Valve: The mitral valve is normal in structure. No evidence of mitral valve regurgitation. No evidence of mitral valve stenosis.  Tricuspid Valve: The tricuspid valve is normal in structure. Tricuspid valve regurgitation is trivial. No evidence of tricuspid stenosis.  Aortic Valve: The aortic valve is tricuspid. Aortic valve regurgitation is not visualized. No aortic stenosis is present.  Pulmonic Valve: The pulmonic valve was normal in structure. Pulmonic valve regurgitation is not visualized. No evidence of pulmonic stenosis.  Aorta: The aortic arch was not well visualized, the aortic root and ascending aorta are structurally normal, with no evidence of dilitation and Normal DTA. There is borderline dilatation of the ascending aorta, measuring 37 mm.  Venous: The pulmonary veins were not well visualized. The inferior vena cava is normal in size with greater than 50% respiratory variability, suggesting right atrial pressure of 3 mmHg.  IAS/Shunts: No atrial level shunt detected by color flow Doppler.   LEFT VENTRICLE PLAX 2D LVIDd:         4.70 cm   Diastology LVIDs:         3.20 cm   LV e' medial:    7.51 cm/s LV PW:         1.10 cm   LV E/e' medial:  9.9 LV IVS:        1.10 cm   LV e' lateral:   6.64 cm/s LVOT diam:     2.00 cm   LV E/e' lateral: 11.2 LV SV:         56 LV SV Index:   29 LVOT Area:     3.14 cm   RIGHT VENTRICLE            IVC RV S prime:     8.92 cm/s  IVC diam: 1.30 cm TAPSE (M-mode): 1.8 cm  LEFT ATRIUM             Index        RIGHT ATRIUM           Index LA diam:        2.60 cm 1.36 cm/m   RA Area:  12.00 cm LA Vol (A2C):   46.8 ml 24.41 ml/m  RA Volume:   25.90 ml  13.51 ml/m LA Vol (A4C):   20.0 ml 10.43 ml/m LA Biplane Vol: 30.3 ml 15.80 ml/m AORTIC VALVE LVOT Vmax:   80.30 cm/s LVOT Vmean:  55.100 cm/s LVOT VTI:    0.177 m  AORTA Ao Root diam: 3.80 cm Ao Asc diam:  3.70 cm Ao Desc diam: 2.20 cm  MITRAL VALVE                TRICUSPID VALVE MV Area (PHT): 3.60 cm    TR Peak grad:   6.2 mmHg MV Decel Time: 211 msec    TR Vmax:        124.00 cm/s MV E velocity: 74.60 cm/s MV A velocity: 72.00 cm/s  SHUNTS MV E/A ratio:  1.04        Systemic VTI:  0.18 m Systemic Diam: 2.00 cm  Norman Herrlich MD Electronically signed by Norman Herrlich MD Signature Date/Time: 08/20/2022/5:00:20 PM    Final                  Recent Labs: 12/11/2022: ALT 16; BUN 18; Creatinine 1.19; Hemoglobin 13.8; Platelet Count 334; Potassium 4.2; Sodium 139; TSH 1.371  Recent Lipid Panel No results found for: "CHOL", "TRIG", "HDL", "CHOLHDL", "VLDL", "LDLCALC", "LDLDIRECT"  EKG: NSR, HR 97 bpm.    Risk Assessment/Calculations:               Physical Exam:    VS:  BP 118/70 (BP Location: Right Arm, Patient Position: Sitting, Cuff Size: Normal)   Pulse 97   Ht 5\' 9"  (1.753 m)   Wt 180 lb 12.8 oz (82 kg)   SpO2 91%   BMI 26.70 kg/m     Wt Readings from Last 3 Encounters:  12/25/22 180 lb 12.8 oz (82 kg)  12/16/22 180 lb 1.9 oz (81.7 kg)  12/11/22 177 lb 3.2 oz (80.4 kg)     GEN:  Well nourished, well developed in no acute distress HEENT: Normal, left facial weakness NECK: No JVD; No carotid bruits LYMPHATICS: No lymphadenopathy CARDIAC: RRR, no murmurs, rubs, gallops RESPIRATORY:  Clear to auscultation without rales, wheezing or rhonchi  ABDOMEN: Soft, non-tender, non-distended MUSCULOSKELETAL:  No edema; No deformity  SKIN: Warm and dry NEUROLOGIC:  Alert and oriented x 3 PSYCHIATRIC:  Normal affect   ASSESSMENT:    1. Palpitations   2. Chronic anticoagulation    PLAN:    In order of problems listed above:  Palpitations - have been much improved since starting Sectral. His HR in the office is 97 bpm, which he states is his baseline and not bothersome to him. Continue Sectral 200 mg daily.  PE/chronic anticoagulation - on Xarelto, managed by oncologist. Recent lab work by oncology revealed stable hgb and  HCT.  Metastatic melanoma -  managed by oncology.   Disposition - return in 1 year           Medication Adjustments/Labs and Tests Ordered: Current medicines are reviewed at length with the patient today.  Concerns regarding medicines are outlined above.  Orders Placed This Encounter  Procedures   EKG 12-Lead   No orders of the defined types were placed in this encounter.   Patient Instructions  Medication Instructions:  Your physician recommends that you continue on your current medications as directed. Please refer to the Current Medication list given to you today.  *If you need a refill on  your cardiac medications before your next appointment, please call your pharmacy*   Lab Work: NONE If you have labs (blood work) drawn today and your tests are completely normal, you will receive your results only by: MyChart Message (if you have MyChart) OR A paper copy in the mail If you have any lab test that is abnormal or we need to change your treatment, we will call you to review the results.   Testing/Procedures: NONE   Follow-Up: At [facility], you and your health needs are our priority.  As part of our continuing mission to provide you with exceptional heart care, we have created designated Provider Care Teams.  These Care Teams include your primary Cardiologist (physician) and Advanced Practice Providers (APPs -  Physician Assistants and Nurse Practitioners) who all work together to provide you with the care you need, when you need it.  We recommend signing up for the patient portal called "MyChart".  Sign up information is provided on this After Visit Summary.  MyChart is used to connect with patients for Virtual Visits (Telemedicine).  Patients are able to view lab/test results, encounter notes, upcoming appointments, etc.  Non-urgent messages can be sent to your provider as well.   To learn more about what you can do with MyChart, go to [email or web address].     Your next appointment:   1 year(s)  Provider:   Norman Herrlich, MD    Other Instructions     Signed, Flossie Dibble, NP  12/25/2022 2:06 PM    [facility]

## 2022-12-25 NOTE — Patient Instructions (Signed)
Medication Instructions:  Your physician recommends that you continue on your current medications as directed. Please refer to the Current Medication list given to you today.  *If you need a refill on your cardiac medications before your next appointment, please call your pharmacy*   Lab Work: NONE If you have labs (blood work) drawn today and your tests are completely normal, you will receive your results only by: MyChart Message (if you have MyChart) OR A paper copy in the mail If you have any lab test that is abnormal or we need to change your treatment, we will call you to review the results.   Testing/Procedures: NONE   Follow-Up: At [facility], you and your health needs are our priority.  As part of our continuing mission to provide you with exceptional heart care, we have created designated Provider Care Teams.  These Care Teams include your primary Cardiologist (physician) and Advanced Practice Providers (APPs -  Physician Assistants and Nurse Practitioners) who all work together to provide you with the care you need, when you need it.  We recommend signing up for the patient portal called "MyChart".  Sign up information is provided on this After Visit Summary.  MyChart is used to connect with patients for Virtual Visits (Telemedicine).  Patients are able to view lab/test results, encounter notes, upcoming appointments, etc.  Non-urgent messages can be sent to your provider as well.   To learn more about what you can do with MyChart, go to [email or web address].    Your next appointment:   1 year(s) Provider:   Brian Munley, MD   Other Instructions   

## 2023-01-01 ENCOUNTER — Encounter: Payer: Self-pay | Admitting: Hematology and Oncology

## 2023-01-05 ENCOUNTER — Encounter: Payer: Self-pay | Admitting: Oncology

## 2023-01-05 DIAGNOSIS — C787 Secondary malignant neoplasm of liver and intrahepatic bile duct: Secondary | ICD-10-CM | POA: Diagnosis not present

## 2023-01-05 DIAGNOSIS — H9312 Tinnitus, left ear: Secondary | ICD-10-CM | POA: Diagnosis not present

## 2023-01-05 DIAGNOSIS — H903 Sensorineural hearing loss, bilateral: Secondary | ICD-10-CM | POA: Diagnosis not present

## 2023-01-05 DIAGNOSIS — R590 Localized enlarged lymph nodes: Secondary | ICD-10-CM | POA: Diagnosis not present

## 2023-01-05 DIAGNOSIS — H9192 Unspecified hearing loss, left ear: Secondary | ICD-10-CM | POA: Diagnosis not present

## 2023-01-05 DIAGNOSIS — C4361 Malignant melanoma of right upper limb, including shoulder: Secondary | ICD-10-CM | POA: Diagnosis not present

## 2023-01-05 DIAGNOSIS — C801 Malignant (primary) neoplasm, unspecified: Secondary | ICD-10-CM | POA: Diagnosis not present

## 2023-01-05 LAB — BASIC METABOLIC PANEL
BUN: 9 (ref 4–21)
CO2: 32 — AB (ref 13–22)
Chloride: 103 (ref 99–108)
Creatinine: 0.9 (ref 0.6–1.3)
EGFR: 60
Glucose: 95
Potassium: 5.5 mEq/L — AB (ref 3.5–5.1)
Sodium: 143 (ref 137–147)

## 2023-01-05 LAB — CBC AND DIFFERENTIAL
HCT: 41 (ref 41–53)
Hemoglobin: 13.9 (ref 13.5–17.5)
Neutrophils Absolute: 5.09
Platelets: 375 10*3/uL (ref 150–400)
WBC: 7.6

## 2023-01-05 LAB — HEPATIC FUNCTION PANEL
ALT: 13 U/L (ref 10–40)
AST: 36 (ref 14–40)
Alkaline Phosphatase: 55 (ref 25–125)
Bilirubin, Total: 0.5

## 2023-01-05 LAB — COMPREHENSIVE METABOLIC PANEL
Albumin: 4 (ref 3.5–5.0)
Calcium: 9.9 (ref 8.7–10.7)

## 2023-01-05 LAB — CBC: RBC: 4.18 (ref 3.87–5.11)

## 2023-01-07 ENCOUNTER — Encounter: Payer: Self-pay | Admitting: Oncology

## 2023-01-07 ENCOUNTER — Other Ambulatory Visit: Payer: Self-pay | Admitting: Oncology

## 2023-01-07 ENCOUNTER — Inpatient Hospital Stay: Payer: Medicare Other | Attending: Hematology and Oncology | Admitting: Oncology

## 2023-01-07 VITALS — BP 130/83 | HR 96 | Temp 97.5°F | Resp 18 | Ht 69.0 in | Wt 183.8 lb

## 2023-01-07 DIAGNOSIS — C787 Secondary malignant neoplasm of liver and intrahepatic bile duct: Secondary | ICD-10-CM | POA: Diagnosis not present

## 2023-01-07 DIAGNOSIS — C7800 Secondary malignant neoplasm of unspecified lung: Secondary | ICD-10-CM | POA: Diagnosis not present

## 2023-01-07 DIAGNOSIS — C7802 Secondary malignant neoplasm of left lung: Secondary | ICD-10-CM | POA: Insufficient documentation

## 2023-01-07 DIAGNOSIS — C7931 Secondary malignant neoplasm of brain: Secondary | ICD-10-CM | POA: Insufficient documentation

## 2023-01-07 DIAGNOSIS — C778 Secondary and unspecified malignant neoplasm of lymph nodes of multiple regions: Secondary | ICD-10-CM

## 2023-01-07 DIAGNOSIS — C439 Malignant melanoma of skin, unspecified: Secondary | ICD-10-CM | POA: Diagnosis not present

## 2023-01-07 DIAGNOSIS — C4361 Malignant melanoma of right upper limb, including shoulder: Secondary | ICD-10-CM | POA: Insufficient documentation

## 2023-01-07 DIAGNOSIS — G5791 Unspecified mononeuropathy of right lower limb: Secondary | ICD-10-CM

## 2023-01-07 DIAGNOSIS — C779 Secondary and unspecified malignant neoplasm of lymph node, unspecified: Secondary | ICD-10-CM | POA: Insufficient documentation

## 2023-01-07 DIAGNOSIS — Z79899 Other long term (current) drug therapy: Secondary | ICD-10-CM | POA: Insufficient documentation

## 2023-01-07 DIAGNOSIS — C7801 Secondary malignant neoplasm of right lung: Secondary | ICD-10-CM | POA: Insufficient documentation

## 2023-01-07 DIAGNOSIS — Z5112 Encounter for antineoplastic immunotherapy: Secondary | ICD-10-CM | POA: Insufficient documentation

## 2023-01-07 DIAGNOSIS — Z79620 Long term (current) use of immunosuppressive biologic: Secondary | ICD-10-CM | POA: Insufficient documentation

## 2023-01-07 MED ORDER — GABAPENTIN 300 MG PO CAPS
300.0000 mg | ORAL_CAPSULE | Freq: Three times a day (TID) | ORAL | 5 refills | Status: DC
Start: 2023-01-07 — End: 2023-03-04

## 2023-01-07 NOTE — Progress Notes (Signed)
Patient Care Team: [name], NP as PCP - General (Nurse Practitioner) Dellia Beckwith, MD as Consulting Physician (Oncology) Lance Bosch, MD as Consulting Physician (Radiation Oncology)  Clinic Day: 01/07/23  Referring physician: Dellia Beckwith, MD  ASSESSMENT & PLAN:  Assessment & Plan: Melanoma of skin [facility]) Stage IIB malignant melanoma of the right posterior shoulder, Breslow's depth 3.3 mm, Clark level IV, treated with wide local excision, diagnosed in May 2021.  There was still a question as to whether this represented a metastatic lesion, rather than a primary, based on the presence of 2 nodules and no epidermal involvement.  MRI head and CT chest, abdomen and pelvis in June were negative for metastatic disease.   No adjuvant therapy was recommended.  PET scan in September was negative for metastatic disease at that time.  By June 2023, he was found to have widespread metastatic disease including to the brain.  CT scans in January, 2024, showed good response in the lung and skin. Current scans from July, 2024 continue to look good.   Metastasis to subcutaneous tissue  He presented with a new area of concern to the left mid back in June of 2023.  A superficial nodule was noted approximately 1 cm to the right of his upper thoracic spine,and it had increased in size. He was sent for biopsy and that was performed on June 13 and confirmed metastatic malignant melanoma.   He has several hypermetabolic subcutaneous nodules seen on PET scan which are responding.  Metastasis to liver He had a small hypermetabolic liver lesion which was new in June of 2023, and less than 1 cm, but suspicious for metastasis. This is no longer visible.  Metastasis to lymph nodes He had multiple hypermetabolic nodes of the retroperitoneal and right pelvic areas, as well as right external iliac and a few additional nodes. These have all resolved.  Pulmonary metastases He had bilateral  hypermetabolic pulmonary nodules which increased in size by the time treatment was started.  The largest nodule is up to 2.3 cm in diameter. CT in January, 2024 reveals good response. His latest scan shows 2 tiny 4 mm nodules which are persistent and unchanged. There are no other suspicious or new nodules.  Metastases to brain This was suspicious on the PET scan in the left frontoparietal lobe and so he had an MRI of the brain.  This confirmed a 2.6 cm heterogeneous lesion of the left superior temporal gyrus with mild enhancement and extensive surrounding edema.  He also has an 8 mm cortical lesion of the inferomedial right frontal lobe with mild edema, a 2 mm mildly enhancing cortical lesion of the inferolateral left frontal lobe and a intrinsically T1 hyperintense lesion of the left lateral pons measuring 7 mm with surrounding edema.  He has 4 lesions in total, but 3 are less than 1 cm.  He has received stereotactic radiation. He had neurologic symptoms in September and was placed on dexamethasone.  MRI of the brain on 09/25/2022 revealed increased size of a hemorrhagic left pons/middle cerebellar peduncle lesion with increased surrounding edema, similar versus slightly decreased size of a 13 x 10 mm enhancing lesion in the medial right frontal lobe, and mildly diminished intrinsic T1 hyperintensity of the left temporal resection site with linear enhancement along the lateral margin, probably postsurgical/post radiation.  He has now had a brain PET scan on October 23, 2022, which does not show hyperactivity but more radiation necrosis, so these changes  are more consistent with posttreatment changes.   Family history of breast cancer Maternal family history of breast cancer in addition to his melanoma history which raises suspicion for possible BRCA mutation.  He met with the genetic counselors and [facility] RNA + hereditary cancer panel revealed variants of uncertain significance (VUS) of the APC, CDKN1C and FH  genes.  Skin Rash The actual skin rash is minimal and mainly involving his scalp, but he has severe pruritus. He was on dexamethasone 4 mg daily and has been tapered off completely as of 3 weeks ago.   Plan The patient states that He feels well but complains of severe constant painful neuropathy in his right leg. It interferes with his sleep and his foot is sensitive to having the sheet on it. I advised him to try to avoid wounds on his feet as this could worsen. He is currently taking Gabapentin 300 mg once daily and I increased it to 300mg  BID and the TID.  He had a CT chest, abdomen and pelvis done on 01/05/2023 which revealed Subcarinal and bilateral hilar lymphadenopathy which remains resolved, unchanged small pulmonary nodules of the left lung, and no new nodules. There is an unchanged L1 corpectomy with lateral bridging fusion of T12 through L2, and no evidence of new metastatic disease in the chest, abdomen, or pelvis.  I recommend that we have the CT scans done every 6 months. LS spine Xrays revealed post surgical changes but no specific findings to explain his symptoms. I think he needs an MRI of this area to evaluate this especially since it is unilateral.  His hemoglobin is 13.9, WBC is 7.6, and platelet count is 375.000 as of 2 days ago, 01/05/2023. His CMP is normal but showed hyperkalemia which could be due to hemolysis. I advised him to stay away from potassium containing food like bananas. I recommend that we continue the present course of treatment. He has an MRI of the brain scheduled for August. He has been off of his steroid medication for the last 3 weeks as recommended by Dr Barbaraann Cao . His day 1 cycle 14 of Nivolumab is scheduled for 01/13/2023. I will see him back in 4 weeks with CBC and CMP.  The patient understands the plans discussed today and is in agreement with them.  I have answered his questions and reviewed this information in detail.The patient knows to contact our office if he  develops concerns prior to his next appointment.  I provided 20 minutes of face-to-face time during this this encounter and > 50% was spent counseling as documented under my assessment and plan.   Dellia Beckwith, MD  [facility] AT [facility] [address] [place] [address] Dept: [phone] Dept Fax: [phone]   No orders of the defined types were placed in this encounter.   CHIEF COMPLAINT:  CC: Metastatic melanoma  Current Treatment: Brain irradiation, followed by immunotherapy  HISTORY OF PRESENT ILLNESS:  Xavier Jones is a 52 y.o. male with stage IIB (T3b N0 M0) malignant melanoma of the right posterior shoulder diagnosed in May 2021. He reported an enlarging nevus of the right shoulder for about a year.  It began to bleed, so he saw his dermatologist.  Biopsy revealed malignant melanoma, Breslow's depth 3.3 mm, Clark level IV, however, there was no overt attachment to the overlying epidermis, so a metastatic nodule could not be ruled out.  He had several other lesions removed at the time  of biopsy.  Right lower lateral back shave revealed a dysplastic compound nevus with severe atypia, limited margins free.  Right lower medial back shave revealed dysplastic nevus with moderate to severe atypia, peripheral margin involved.  Left mid buttocks shave revealed dysplastic compound nevus with moderate atypia, limited margins free.  MRI head and CT chest, abdomen and pelvis in June were negative for metastatic disease. He underwent wide local excision of the right shoulder melanoma with Dr. Vinson Moselle in June.  Pathology revealed residual dermal malignant melanoma, but margins were clear.  3 sentinel lymph nodes were negative for metastasis (0/3).  However, there was a question as to whether this represents a metastatic lesion, rather than a primary, based on the presence of 2 nodules and no epidermal involvement.  He also had  excision of the lower back lesion, which did not reveal malignancy. No adjuvant therapy was recommended. PET scan in September did not reveal any findings of hypermetabolic metastatic disease, there was low level activity felt to be postoperative changes in the skin/subcutaneous tissue of the right posterior shoulder, as well as likely degenerative hypermetabolism of the proximal, posterior right femur. Due to his personal and family history of malignancy, he was scheduled for a consultation with the genetic counselor to discuss testing for hereditary cancer syndromes. [facility] testing from August 2022 revealed variants of uncertain significance (VUS) of the APC, CDKN1C and FH gene.   Oncology History  Melanoma of skin (HCC)  12/01/2019 Initial Diagnosis   Melanoma of skin (HCC)   12/05/2019 Cancer Staging   Staging form: Melanoma of the Skin, AJCC 8th Edition - Clinical stage from 12/05/2019: Stage IIA (cT3a, cN0, cM0) - Signed by Dellia Beckwith, MD on 05/30/2020   02/18/2021 Genetic Testing   No pathogenic variants detected in [facility] Multi-Cancer +RNA Panel.  Variants of uncertain significance detected in APC (c.681C>G (p.Asp227Glu)), CDKN1C (c.610C>G (p.Pro204Ala)), and FH (c.1151C>T (p.Ala384Val)).  The report date is February 18, 2021.    The Multi-Cancer + RNA Panel offered by [facility] includes sequencing and/or deletion/duplication analysis of the following 84 genes:  AIP*, ALK, APC*, ATM*, AXIN2*, BAP1*, BARD1*, BLM*, BMPR1A*, BRCA1*, BRCA2*, BRIP1*, CASR, CDC73*, CDH1*, CDK4, CDKN1B*, CDKN1C*, CDKN2A, CEBPA, CHEK2*, CTNNA1*, DICER1*, DIS3L2*, EGFR, EPCAM, FH*, FLCN*, GATA2*, GPC3, GREM1, HOXB13, HRAS, KIT, MAX*, MEN1*, MET, MITF, MLH1*, MSH2*, MSH3*, MSH6*, MUTYH*, NBN*, NF1*, NF2*, NTHL1*, PALB2*, PDGFRA, PHOX2B, PMS2*, POLD1*, POLE*, POT1*, PRKAR1A*, PTCH1*, PTEN*, RAD50*, RAD51C*, RAD51D*, RB1*, RECQL4, RET, RUNX1*, SDHA*, SDHAF2*, SDHB*, SDHC*, SDHD*, SMAD4*, SMARCA4*, SMARCB1*, SMARCE1*,  STK11*, SUFU*, TERC, TERT, TMEM127*, Tp53*, TSC1*, TSC2*, VHL*, WRN*, and WT1.  RNA analysis is performed for * genes.   01/30/2022 - 02/20/2022 Chemotherapy   Patient is on Treatment Plan : MELANOMA Nivolumab + Ipilimumab (1/3) q21d / Nivolumab q28d     01/30/2022 -  Chemotherapy   Patient is on Treatment Plan : MELANOMA Nivolumab (480) q28d     Malignant melanoma metastatic to brain (HCC)  12/19/2021 Initial Diagnosis   Malignant melanoma metastatic to brain (HCC)   01/30/2022 - 02/20/2022 Chemotherapy   Patient is on Treatment Plan : MELANOMA Nivolumab + Ipilimumab (1/3) q21d / Nivolumab q28d     01/30/2022 -  Chemotherapy   Patient is on Treatment Plan : MELANOMA Nivolumab (480) q28d     Malignant melanoma metastatic to lung (HCC)  12/19/2021 Initial Diagnosis   Malignant melanoma metastatic to lung (HCC)   01/30/2022 - 02/20/2022 Chemotherapy   Patient is on Treatment Plan : MELANOMA Nivolumab + Ipilimumab (  1/3) q21d / Nivolumab q28d     01/30/2022 -  Chemotherapy   Patient is on Treatment Plan : MELANOMA Nivolumab (480) q28d     03/24/2022 Imaging   CTA chest:  IMPRESSION:  1. Small nonocclusive segmental level pulmonary embolism in the  inferior LEFT upper lobe.  2. Marked bronchial wall thickening with areas of patchy basilar  opacities and new ground-glass opacities in the upper lobes.  Constellation of findings may be infectious bronchiolitis/bronchitis  though the possibility of drug related changes in the setting of  immunotherapy are also considered. Furthermore, given basilar  predominance and some material in basilar bronchial structures would  correlate with any risk factors for or history of aspiration.  3. Response to therapy of bilateral metastatic lesions. Some lymph  nodes in the chest with interval increase in size are equivocal at  this time, potentially reactive, but warrant attention on subsequent  imaging.    07/22/2022 Imaging   CT chest, abdomen and  pelvis:  IMPRESSION:  1. Interval positive response to therapy. Resolved subcarinal and  bilateral hilar lymphadenopathy. Stable to decreased left pulmonary  nodules. No new or progressive metastatic disease in the chest,  abdomen or pelvis.  2. One vessel coronary atherosclerosis.  3. Aortic Atherosclerosis (ICD10-I70.0).    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 Interval positive response to therapy. Resolved subcarinal and  bilateral hilar lymphadenopathy. Stable to decreased left pulmonary  nodules. No new or progressive metastatic disease in the chest,  abdomen or pelvis.  2. One vessel coronary atherosclerosis.  3. Aortic Atherosclerosis (ICD10-I70.0).      INTERVAL HISTORY:  Dilen is here today for repeat clinical assessment of his metastatic melanoma. Patient states that He feels well but complains of severe constant painful neuropathy in his right leg. It interferes with his sleep and his foot is sensitive to having the sheet on it. I advised him to try to avoid wounds on his feet as this could worsen. He is currently taking Gabapentin 300 mg once daily and I increased it to 300mg  BID and then TID. He had a CT chest, abdomen and pelvis done on 01/05/2023 which revealed Subcarinal and bilateral hilar lymphadenopathy remain resolved, unchanged small pulmonary nodules of the left lung, and no new nodules. He has an unchanged L1 corpectomy with lateral bridging fusion of T12 through L2, and no evidence of new metastatic disease in the chest, abdomen, or pelvis.  I recommend that we have the CT scans done every  6 months. LS spine Xrays revealed post surgical changes but no specific findings to explain his symptoms. I think he needs an MRI of this area to evaluate this, especially since it is unilateral. His hemoglobin is 13.9, WBC is 7.6, and platelet count is 375.000 as at 2 days ago, 01/05/2023. His CMP is normal but showed hyperkalemia which could be due to hemolysis. I advised him to stay away from potassium containing food like bananas. I recommend that we continue the present course of treatment. He has an MRI of the brain scheduled for August. He has been  off of his steroid medication for the last 3 weeks as recommended by Dr Barbaraann Cao . His day 1 cycle 14 of Nivolumab is scheduled for 01/13/2023. I will see him back in 4 weeks with CBC and CMP.   He  denies signs of infections such as sore throat, sinus drainage, cough or urinary symptoms. He  denies fever or recurrent chills. He  also deny nausea, vomiting, chest pain dyspnea or cough. His  appetite is very good and His  weight has increased 3 pounds over last 2 weeks     I have reviewed the past medical history, past surgical history, social history and family history with the patient and they are unchanged from previous note.  ALLERGIES:  is allergic to sulfa antibiotics and testosterone.  MEDICATIONS:  Current Outpatient Medications  Medication Sig Dispense Refill   acebutolol (SECTRAL) 200 MG capsule Take 1 capsule (200 mg total) by mouth daily. 90 capsule 3   acyclovir (ZOVIRAX) 400 MG tablet Take 400 mg by mouth 3 (three) times daily.     amitriptyline (ELAVIL) 25 MG tablet Take 50 mg by mouth at bedtime.     ergocalciferol (VITAMIN D2) 1.25 MG (50000 UT) capsule Take 50,000 Units by mouth every Wednesday.     gabapentin (NEURONTIN) 300 MG capsule Take 1 capsule (300 mg total) by mouth 3 (three) times daily. 90 capsule 5   hydrOXYzine (ATARAX) 25 MG tablet TAKE ONE TABLET BY MOUTH 3 TIMES A DAY AS NEEDED FOR ITCHING 60 tablet 3    lidocaine-prilocaine (EMLA) cream Apply 1 Application topically as needed. 30 g 0   pentoxifylline (TRENTAL) 400 MG CR tablet Take 1 tablet (400 mg total) by mouth in the morning and at bedtime. 60 tablet 3   rivaroxaban (XARELTO) 20 MG TABS tablet Take 1 tablet (20 mg total) by mouth daily with supper. Start after completing 3 weeks of Xarelto 15 mg twice daily 30 tablet 3   vitamin E 180 MG (400 UNITS) capsule Take 1 capsule (400 Units total) by mouth daily. 30 capsule 2   No current facility-administered medications for this visit.    REVIEW OF SYSTEMS:   Review of Systems  Constitutional: Negative.  Negative for appetite change, chills, diaphoresis, fatigue, fever and unexpected weight change.  HENT:   Positive for tinnitus. Negative for hearing loss, lump/mass, mouth sores, nosebleeds, sore throat, trouble swallowing and voice change.        Facial numbness of the left side with left facial droop  Eyes:  Positive for eye problems (cant close left eye). Negative for icterus.       States his vision are blurry  Respiratory: Negative.  Negative for chest tightness, cough, hemoptysis, shortness of breath and wheezing.   Cardiovascular: Negative.  Negative for chest pain, leg swelling and palpitations.  Gastrointestinal: Negative.  Negative for abdominal distention, abdominal pain, blood in stool, constipation, diarrhea, nausea, rectal pain and vomiting.  Endocrine: Negative.  Negative for hot flashes.  Genitourinary: Negative.  Negative for bladder incontinence, difficulty urinating, dyspareunia, dysuria, frequency, hematuria, nocturia, pelvic pain and penile discharge.   Musculoskeletal: Negative.  Negative for arthralgias, back pain, flank pain, gait problem, myalgias, neck pain and neck stiffness.       Severe painful neuropathy in his legs  Skin: Negative.  Negative for itching, rash and wound.  Neurological:  Positive for dizziness, headaches (comes in waves), numbness (painful  neuropathy in his legs) and speech difficulty. Negative for extremity weakness, gait problem, light-headedness and  seizures.  Hematological: Negative.  Negative for adenopathy. Does not bruise/bleed easily.  Psychiatric/Behavioral: Negative.  Negative for confusion, decreased concentration, depression, sleep disturbance and suicidal ideas. The patient is not nervous/anxious.    Marland Kitchen   VITALS:  Blood pressure 130/83, pulse 96, temperature (!) 97.5 F (36.4 C), temperature source Oral, resp. rate 18, height 5\' 9"  (1.753 m), weight 183 lb 12.8 oz (83.4 kg), SpO2 100 %.  Wt Readings from Last 3 Encounters:  01/07/23 183 lb 12.8 oz (83.4 kg)  12/25/22 180 lb 12.8 oz (82 kg)  12/16/22 180 lb 1.9 oz (81.7 kg)    Body mass index is 27.14 kg/m.  Performance status (ECOG): 1 - Symptomatic but completely ambulatory  PHYSICAL EXAM:    Physical Exam Vitals and nursing note reviewed.  Constitutional:      General: He is not in acute distress.    Appearance: Normal appearance. He is normal weight. He is not ill-appearing, toxic-appearing or diaphoretic.  HENT:     Head: Normocephalic and atraumatic.     Right Ear: Tympanic membrane, ear canal and external ear normal. There is no impacted cerumen.     Left Ear: Tympanic membrane, ear canal and external ear normal. There is no impacted cerumen.     Nose: Nose normal. No congestion or rhinorrhea.     Mouth/Throat:     Mouth: Mucous membranes are moist.     Pharynx: Oropharynx is clear. No oropharyngeal exudate or posterior oropharyngeal erythema.  Eyes:     General: No scleral icterus.       Right eye: No discharge.        Left eye: No discharge.     Extraocular Movements: Extraocular movements intact.     Conjunctiva/sclera: Conjunctivae normal.     Pupils: Pupils are equal, round, and reactive to light.  Neck:     Vascular: No carotid bruit.  Cardiovascular:     Rate and Rhythm: Normal rate and regular rhythm.     Pulses: Normal pulses.      Heart sounds: Normal heart sounds. No murmur heard.    No friction rub. No gallop.  Pulmonary:     Effort: Pulmonary effort is normal. No respiratory distress.     Breath sounds: Normal breath sounds. No stridor. No wheezing, rhonchi or rales.  Chest:     Chest wall: No tenderness.  Abdominal:     General: Bowel sounds are normal. There is no distension.     Palpations: Abdomen is soft. There is no hepatomegaly, splenomegaly or mass.     Tenderness: There is no abdominal tenderness. There is no right CVA tenderness, left CVA tenderness, guarding or rebound.     Hernia: No hernia is present.  Musculoskeletal:        General: No swelling, tenderness, deformity or signs of injury. Normal range of motion.     Cervical back: Normal range of motion and neck supple. No rigidity or tenderness.     Right lower leg: No edema.     Left lower leg: No edema.     Comments: Large deep scar in the right posterior shoulder that is well healed.   Lymphadenopathy:     Cervical: No cervical adenopathy.     Upper Body:     Right upper body: No supraclavicular or axillary adenopathy.     Left upper body: No supraclavicular or axillary adenopathy.     Lower Body: No right inguinal adenopathy. No left inguinal adenopathy.  Skin:  General: Skin is warm and dry.     Coloration: Skin is not jaundiced.     Findings: No bruising, lesion or rash.  Neurological:     General: No focal deficit present.     Mental Status: He is alert and oriented to person, place, and time. Mental status is at baseline.     Cranial Nerves: No cranial nerve deficit.     Sensory: No sensory deficit.     Motor: No weakness.     Coordination: Coordination normal.     Gait: Gait normal.     Deep Tendon Reflexes: Reflexes normal.  Psychiatric:        Mood and Affect: Mood normal.        Behavior: Behavior normal.        Thought Content: Thought content normal.        Judgment: Judgment normal.    LABORATORY DATA:  I have  reviewed the data as listed    Component Value Date/Time   NA 143 01/05/2023 0000   K 5.5 (A) 01/05/2023 0000   CL 103 01/05/2023 0000   CO2 32 (A) 01/05/2023 0000   GLUCOSE 88 12/11/2022 0900   BUN 9 01/05/2023 0000   CREATININE 0.9 01/05/2023 0000   CREATININE 1.19 12/11/2022 0900   CALCIUM 9.9 01/05/2023 0000   PROT 7.1 12/11/2022 0900   PROT 6.2 (A) 05/08/2022 0000   ALBUMIN 4.0 01/05/2023 0000   AST 36 01/05/2023 0000   AST 19 12/11/2022 0900   ALT 13 01/05/2023 0000   ALT 16 12/11/2022 0900   ALKPHOS 55 01/05/2023 0000   BILITOT 0.6 12/11/2022 0900   GFRNONAA >60 12/11/2022 0900   No results found for: "SPEP", "UPEP"  Lab Results  Component Value Date   WBC 7.6 01/05/2023   NEUTROABS 5.09 01/05/2023   HGB 13.9 01/05/2023   HCT 41 01/05/2023   MCV 101.2 (H) 12/11/2022   PLT 375 01/05/2023     Chemistry      Component Value Date/Time   NA 143 01/05/2023 0000   K 5.5 (A) 01/05/2023 0000   CL 103 01/05/2023 0000   CO2 32 (A) 01/05/2023 0000   BUN 9 01/05/2023 0000   CREATININE 0.9 01/05/2023 0000   CREATININE 1.19 12/11/2022 0900   GLU 95 01/05/2023 0000      Component Value Date/Time   CALCIUM 9.9 01/05/2023 0000   ALKPHOS 55 01/05/2023 0000   AST 36 01/05/2023 0000   AST 19 12/11/2022 0900   ALT 13 01/05/2023 0000   ALT 16 12/11/2022 0900   BILITOT 0.6 12/11/2022 0900     Component Ref Range & Units 12/11/2022 1 mo ago 2 mo ago 3 mo ago 4 mo ago 5 mo ago 6 mo ago  T4, Total 4.5 - 12.0 ug/dL 8.1 7.7 CM 7.4 CM 8.3 CM 7.4 CM 7.0 CM 7.2 CM   Component Ref Range & Units 12/11/2022   1 mo ago 2 mo ago 3 mo ago 4 mo ago 5 mo ago 6 mo ago  TSH 0.350 - 4.500 uIU/mL 1.371 0.804 CM 0.790 CM 0.757 CM 0.761 CM 1.550 CM 1.581 CM    RADIOGRAPHIC STUDIES: I have personally reviewed the radiological images as listed and agreed with the findings in the report. No results found.   EXAM - 01/05/2023 CT CHEST ABDOMEN AND PELVIS WITH  CONTRAST IMPRESSION Subcarinal and bilateral hilar lymphadenopathy remains resolved. Unchanged small pulmonary nodules of the left lung. No new nodules. Unchanged  L1 corpectomy with lateral bridging fusion of T12 through L2. No evidence of new metastatic disease in the chest, abdomen, or pelvis. Coronary artery disease.   EXAM: 11/19/2022 MRI HEAD WITHOUT AND WITH CONTRAST IMPRESSION: 1. No progression of metastatic disease. 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 Two other enhancing treatment sites remain stable.   2. No new metastatic disease or acute intracranial abnormality is identified.  EXAM: 10/23/2022 NM PET METABOLIC BRAIN  IMPRESSION: No increased metabolic activity associated with the LEFT brainstem lesion identified on coregistered MRI. 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    EXAM: 09/25/2022 MRI HEAD WITHOUT AND WITH CONTRAST IMPRESSION: 1. Increased size of a hemorrhagic left pons/middle cerebellar peduncle lesion with increased surrounding edema, described above. 2. Similar versus slightly decreased size of a 13 x 10 mm enhancing lesion in the medial right frontal lobe. 3. Mildly diminished intrinsic T1 hyperintensity of the left temporal resection site with linear enhancement along the lateral margin, probably postsurgical. Recommend continued attention on follow-up.        I,Oluwatobi Asade,acting as a scribe for Dellia Beckwith, MD.,have documented all relevant documentation on the behalf of Dellia Beckwith, MD,as directed by  Dellia Beckwith, MD while in the presence of Dellia Beckwith, MD.

## 2023-01-09 ENCOUNTER — Ambulatory Visit: Payer: Medicare Other | Admitting: Oncology

## 2023-01-10 ENCOUNTER — Other Ambulatory Visit: Payer: Self-pay

## 2023-01-11 ENCOUNTER — Encounter: Payer: Self-pay | Admitting: Oncology

## 2023-01-12 MED FILL — Nivolumab IV Soln 240 MG/24ML: INTRAVENOUS | Qty: 48 | Status: AC

## 2023-01-13 ENCOUNTER — Inpatient Hospital Stay: Payer: Medicare Other

## 2023-01-13 VITALS — BP 141/81 | HR 82 | Temp 98.2°F | Wt 183.1 lb

## 2023-01-13 DIAGNOSIS — C787 Secondary malignant neoplasm of liver and intrahepatic bile duct: Secondary | ICD-10-CM

## 2023-01-13 DIAGNOSIS — C7801 Secondary malignant neoplasm of right lung: Secondary | ICD-10-CM | POA: Diagnosis not present

## 2023-01-13 DIAGNOSIS — C7800 Secondary malignant neoplasm of unspecified lung: Secondary | ICD-10-CM

## 2023-01-13 DIAGNOSIS — C7802 Secondary malignant neoplasm of left lung: Secondary | ICD-10-CM | POA: Diagnosis not present

## 2023-01-13 DIAGNOSIS — C439 Malignant melanoma of skin, unspecified: Secondary | ICD-10-CM

## 2023-01-13 DIAGNOSIS — C779 Secondary and unspecified malignant neoplasm of lymph node, unspecified: Secondary | ICD-10-CM | POA: Diagnosis not present

## 2023-01-13 DIAGNOSIS — Z79620 Long term (current) use of immunosuppressive biologic: Secondary | ICD-10-CM | POA: Diagnosis not present

## 2023-01-13 DIAGNOSIS — Z79899 Other long term (current) drug therapy: Secondary | ICD-10-CM | POA: Diagnosis not present

## 2023-01-13 DIAGNOSIS — Z5112 Encounter for antineoplastic immunotherapy: Secondary | ICD-10-CM | POA: Diagnosis not present

## 2023-01-13 DIAGNOSIS — C4361 Malignant melanoma of right upper limb, including shoulder: Secondary | ICD-10-CM | POA: Diagnosis not present

## 2023-01-13 DIAGNOSIS — C7931 Secondary malignant neoplasm of brain: Secondary | ICD-10-CM | POA: Diagnosis not present

## 2023-01-13 MED ORDER — SODIUM CHLORIDE 0.9% FLUSH
10.0000 mL | INTRAVENOUS | Status: DC | PRN
  Administered 2023-01-13: 10 mL

## 2023-01-13 MED ORDER — HEPARIN SOD (PORK) LOCK FLUSH 100 UNIT/ML IV SOLN
500.0000 [IU] | Freq: Once | INTRAVENOUS | Status: AC | PRN
  Administered 2023-01-13: 500 [IU]

## 2023-01-13 MED ORDER — SODIUM CHLORIDE 0.9 % IV SOLN: Freq: Once | INTRAVENOUS | Status: AC

## 2023-01-13 MED ORDER — SODIUM CHLORIDE 0.9 % IV SOLN
480.0000 mg | Freq: Once | INTRAVENOUS | Status: AC
  Administered 2023-01-13: 480 mg via INTRAVENOUS
  Filled 2023-01-13: qty 48

## 2023-01-13 NOTE — Patient Instructions (Signed)
Nivolumab Injection What is this medication? NIVOLUMAB (nye VOL ue mab) treats some types of cancer. It works by helping your immune system slow or stop the spread of cancer cells. It is a monoclonal antibody. This medicine may be used for other purposes; ask your health care provider or pharmacist if you have questions. COMMON BRAND NAME(S): Opdivo What should I tell my care team before I take this medication? They need to know if you have any of these conditions: Allogeneic stem cell transplant (uses someone else's stem cells) Autoimmune diseases, such as Crohn disease, ulcerative colitis, lupus History of chest radiation Nervous system problems, such as Guillain-Barre syndrome or myasthenia gravis Organ transplant An unusual or allergic reaction to nivolumab, other medications, foods, dyes, or preservatives Pregnant or trying to get pregnant Breast-feeding How should I use this medication? This medication is infused into a vein. It is given in a hospital or clinic setting. A special MedGuide will be given to you before each treatment. Be sure to read this information carefully each time. Talk to your care team about the use of this medication in children. While it may be prescribed for children as young as 12 years for selected conditions, precautions do apply. Overdosage: If you think you have taken too much of this medicine contact a poison control center or emergency room at once. NOTE: This medicine is only for you. Do not share this medicine with others. What if I miss a dose? Keep appointments for follow-up doses. It is important not to miss your dose. Call your care team if you are unable to keep an appointment. What may interact with this medication? Interactions have not been studied. This list may not describe all possible interactions. Give your health care provider a list of all the medicines, herbs, non-prescription drugs, or dietary supplements you use. Also tell them if you  smoke, drink alcohol, or use illegal drugs. Some items may interact with your medicine. What should I watch for while using this medication? Your condition will be monitored carefully while you are receiving this medication. You may need blood work while taking this medication. This medication may cause serious skin reactions. They can happen weeks to months after starting the medication. Contact your care team right away if you notice fevers or flu-like symptoms with a rash. The rash may be red or purple and then turn into blisters or peeling of the skin. You may also notice a red rash with swelling of the face, lips, or lymph nodes in your neck or under your arms. Tell your care team right away if you have any change in your eyesight. Talk to your care team if you are pregnant or think you might be pregnant. A negative pregnancy test is required before starting this medication. A reliable form of contraception is recommended while taking this medication and for 5 months after the last dose. Talk to your care team about effective forms of contraception. Do not breast-feed while taking this medication and for 5 months after the last dose. What side effects may I notice from receiving this medication? Side effects that you should report to your care team as soon as possible: Allergic reactions--skin rash, itching, hives, swelling of the face, lips, tongue, or throat Dry cough, shortness of breath or trouble breathing Eye pain, redness, irritation, or discharge with blurry or decreased vision Heart muscle inflammation--unusual weakness or fatigue, shortness of breath, chest pain, fast or irregular heartbeat, dizziness, swelling of the ankles, feet, or hands Hormone   gland problems--headache, sensitivity to light, unusual weakness or fatigue, dizziness, fast or irregular heartbeat, increased sensitivity to cold or heat, excessive sweating, constipation, hair loss, increased thirst or amount of urine,  tremors or shaking, irritability Infusion reactions--chest pain, shortness of breath or trouble breathing, feeling faint or lightheaded Kidney injury (glomerulonephritis)--decrease in the amount of urine, red or dark brown urine, foamy or bubbly urine, swelling of the ankles, hands, or feet Liver injury--right upper belly pain, loss of appetite, nausea, light-colored stool, dark yellow or brown urine, yellowing skin or eyes, unusual weakness or fatigue Pain, tingling, or numbness in the hands or feet, muscle weakness, change in vision, confusion or trouble speaking, loss of balance or coordination, trouble walking, seizures Rash, fever, and swollen lymph nodes Redness, blistering, peeling, or loosening of the skin, including inside the mouth Sudden or severe stomach pain, bloody diarrhea, fever, nausea, vomiting Side effects that usually do not require medical attention (report these to your care team if they continue or are bothersome): Bone, joint, or muscle pain Diarrhea Fatigue Loss of appetite Nausea Skin rash This list may not describe all possible side effects. Call your doctor for medical advice about side effects. You may report side effects to FDA at 1-800-FDA-1088. Where should I keep my medication? This medication is given in a hospital or clinic. It will not be stored at home. NOTE: This sheet is a summary. It may not cover all possible information. If you have questions about this medicine, talk to your doctor, pharmacist, or health care provider.  2024 [employer] Standard (2021-10-21 00:00:00)  

## 2023-01-14 ENCOUNTER — Encounter: Payer: Self-pay | Admitting: Oncology

## 2023-01-15 ENCOUNTER — Encounter: Payer: Self-pay | Admitting: Oncology

## 2023-01-16 DIAGNOSIS — R200 Anesthesia of skin: Secondary | ICD-10-CM | POA: Diagnosis not present

## 2023-01-16 DIAGNOSIS — M48061 Spinal stenosis, lumbar region without neurogenic claudication: Secondary | ICD-10-CM | POA: Diagnosis not present

## 2023-01-16 DIAGNOSIS — G629 Polyneuropathy, unspecified: Secondary | ICD-10-CM | POA: Diagnosis not present

## 2023-01-16 DIAGNOSIS — C439 Malignant melanoma of skin, unspecified: Secondary | ICD-10-CM | POA: Diagnosis not present

## 2023-01-16 DIAGNOSIS — M5126 Other intervertebral disc displacement, lumbar region: Secondary | ICD-10-CM | POA: Diagnosis not present

## 2023-01-16 DIAGNOSIS — R32 Unspecified urinary incontinence: Secondary | ICD-10-CM | POA: Diagnosis not present

## 2023-01-19 ENCOUNTER — Encounter: Payer: Self-pay | Admitting: Oncology

## 2023-01-21 ENCOUNTER — Encounter: Payer: Self-pay | Admitting: Oncology

## 2023-01-27 ENCOUNTER — Encounter: Payer: Self-pay | Admitting: Oncology

## 2023-01-29 ENCOUNTER — Encounter: Payer: Self-pay | Admitting: Oncology

## 2023-01-30 ENCOUNTER — Telehealth: Payer: Self-pay

## 2023-01-30 NOTE — Telephone Encounter (Signed)
-----   Message from Nurse Wynona Luna sent at 01/30/2023  2:16 PM EDT ----- Regarding: FW: call  ----- Message ----- From: Dellia Beckwith, MD Sent: 01/27/2023   7:27 PM EDT To: Jeannette Corpus, LPN Subject: call                                           Tell him there is no melanoma in the spine. 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  Would not do anything unless becomes much more severe

## 2023-01-30 NOTE — Telephone Encounter (Signed)
Patient advised of results.

## 2023-02-02 DIAGNOSIS — L700 Acne vulgaris: Secondary | ICD-10-CM | POA: Diagnosis not present

## 2023-02-02 DIAGNOSIS — Z85820 Personal history of malignant melanoma of skin: Secondary | ICD-10-CM | POA: Diagnosis not present

## 2023-02-02 DIAGNOSIS — L578 Other skin changes due to chronic exposure to nonionizing radiation: Secondary | ICD-10-CM | POA: Diagnosis not present

## 2023-02-03 ENCOUNTER — Encounter: Payer: Self-pay | Admitting: Oncology

## 2023-02-03 DIAGNOSIS — C439 Malignant melanoma of skin, unspecified: Secondary | ICD-10-CM | POA: Diagnosis not present

## 2023-02-03 DIAGNOSIS — Z6826 Body mass index (BMI) 26.0-26.9, adult: Secondary | ICD-10-CM | POA: Diagnosis not present

## 2023-02-03 DIAGNOSIS — E559 Vitamin D deficiency, unspecified: Secondary | ICD-10-CM | POA: Diagnosis not present

## 2023-02-03 DIAGNOSIS — G4700 Insomnia, unspecified: Secondary | ICD-10-CM | POA: Diagnosis not present

## 2023-02-03 DIAGNOSIS — E785 Hyperlipidemia, unspecified: Secondary | ICD-10-CM | POA: Diagnosis not present

## 2023-02-03 DIAGNOSIS — A6002 Herpesviral infection of other male genital organs: Secondary | ICD-10-CM | POA: Diagnosis not present

## 2023-02-04 ENCOUNTER — Other Ambulatory Visit: Payer: Self-pay | Admitting: Oncology

## 2023-02-04 ENCOUNTER — Encounter: Payer: Self-pay | Admitting: Oncology

## 2023-02-04 ENCOUNTER — Inpatient Hospital Stay (INDEPENDENT_AMBULATORY_CARE_PROVIDER_SITE_OTHER): Payer: Medicare Other | Admitting: Oncology

## 2023-02-04 ENCOUNTER — Inpatient Hospital Stay: Payer: Medicare Other

## 2023-02-04 ENCOUNTER — Telehealth: Payer: Self-pay

## 2023-02-04 DIAGNOSIS — C7931 Secondary malignant neoplasm of brain: Secondary | ICD-10-CM | POA: Diagnosis not present

## 2023-02-04 DIAGNOSIS — C787 Secondary malignant neoplasm of liver and intrahepatic bile duct: Secondary | ICD-10-CM

## 2023-02-04 DIAGNOSIS — C439 Malignant melanoma of skin, unspecified: Secondary | ICD-10-CM

## 2023-02-04 DIAGNOSIS — C4361 Malignant melanoma of right upper limb, including shoulder: Secondary | ICD-10-CM | POA: Diagnosis not present

## 2023-02-04 DIAGNOSIS — M79604 Pain in right leg: Secondary | ICD-10-CM

## 2023-02-04 DIAGNOSIS — C7800 Secondary malignant neoplasm of unspecified lung: Secondary | ICD-10-CM

## 2023-02-04 DIAGNOSIS — C779 Secondary and unspecified malignant neoplasm of lymph node, unspecified: Secondary | ICD-10-CM | POA: Diagnosis not present

## 2023-02-04 DIAGNOSIS — C7801 Secondary malignant neoplasm of right lung: Secondary | ICD-10-CM | POA: Diagnosis not present

## 2023-02-04 DIAGNOSIS — Z5112 Encounter for antineoplastic immunotherapy: Secondary | ICD-10-CM | POA: Diagnosis not present

## 2023-02-04 LAB — CBC WITH DIFFERENTIAL (CANCER CENTER ONLY)
Abs Immature Granulocytes: 0.02 10*3/uL (ref 0.00–0.07)
Basophils Absolute: 0.1 10*3/uL (ref 0.0–0.1)
Basophils Relative: 1 %
Eosinophils Absolute: 0.3 10*3/uL (ref 0.0–0.5)
Eosinophils Relative: 4 %
HCT: 40 % (ref 39.0–52.0)
Hemoglobin: 13 g/dL (ref 13.0–17.0)
Immature Granulocytes: 0 %
Lymphocytes Relative: 20 %
Lymphs Abs: 1.5 10*3/uL (ref 0.7–4.0)
MCH: 31.9 pg (ref 26.0–34.0)
MCHC: 32.5 g/dL (ref 30.0–36.0)
MCV: 98 fL (ref 80.0–100.0)
Monocytes Absolute: 0.7 10*3/uL (ref 0.1–1.0)
Monocytes Relative: 10 %
Neutro Abs: 4.8 10*3/uL (ref 1.7–7.7)
Neutrophils Relative %: 65 %
Platelet Count: 411 10*3/uL — ABNORMAL HIGH (ref 150–400)
RBC: 4.08 MIL/uL — ABNORMAL LOW (ref 4.22–5.81)
RDW: 11.5 % (ref 11.5–15.5)
WBC Count: 7.4 10*3/uL (ref 4.0–10.5)
nRBC: 0 % (ref 0.0–0.2)

## 2023-02-04 LAB — CMP (CANCER CENTER ONLY)
ALT: 12 U/L (ref 0–44)
AST: 15 U/L (ref 15–41)
Albumin: 3.6 g/dL (ref 3.5–5.0)
Alkaline Phosphatase: 65 U/L (ref 38–126)
Anion gap: 10 (ref 5–15)
BUN: 16 mg/dL (ref 6–20)
CO2: 27 mmol/L (ref 22–32)
Calcium: 9.4 mg/dL (ref 8.9–10.3)
Chloride: 103 mmol/L (ref 98–111)
Creatinine: 1.08 mg/dL (ref 0.61–1.24)
GFR, Estimated: >60 mL/min (ref >60)
Glucose, Bld: 182 mg/dL — ABNORMAL HIGH (ref 70–99)
Potassium: 3.8 mmol/L (ref 3.5–5.1)
Sodium: 140 mmol/L (ref 135–145)
Total Bilirubin: 0.4 mg/dL (ref 0.3–1.2)
Total Protein: 7.4 g/dL (ref 6.5–8.1)

## 2023-02-04 LAB — TSH: TSH: 1.687 u[IU]/mL (ref 0.350–4.500)

## 2023-02-04 MED ORDER — HYDROCODONE-ACETAMINOPHEN 5-325 MG PO TABS
1.0000 | ORAL_TABLET | ORAL | 0 refills | Status: DC | PRN
Start: 2023-02-04 — End: 2023-02-13

## 2023-02-04 NOTE — Telephone Encounter (Signed)
Referral placed for Dr. Rica Records

## 2023-02-04 NOTE — Telephone Encounter (Signed)
-----   Message from Dellia Beckwith sent at 02/04/2023  3:01 PM EDT ----- Regarding: refer Pls refer to [name] - he did his brain surgery last year but now new problem.  He has persistent right leg pain and xrays and MRI do not show clear explanation for it. I have tried him on gabapentin without much benefit and it doesn't seem like typical neuropathy. He is on immunotherapy, not chemotherapy so we usually don't see neuropathy with this, and it is unilateral. 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

## 2023-02-04 NOTE — Progress Notes (Signed)
Patient Care Team: [name], NP as PCP - General (Nurse Practitioner) Dellia Beckwith, MD as Consulting Physician (Oncology) Lance Bosch, MD as Consulting Physician (Radiation Oncology)  Clinic Day: 02/04/23  Referring physician: [name], NP  ASSESSMENT & PLAN:  Assessment & Plan: Melanoma of skin (HCC) Stage IIB malignant melanoma of the right posterior shoulder, Breslow's depth 3.3 mm, Clark level IV, treated with wide local excision, diagnosed in May 2021.  There was still a question as to whether this represented a metastatic lesion, rather than a primary, based on the presence of 2 nodules and no epidermal involvement.  MRI head and CT chest, abdomen and pelvis in June were negative for metastatic disease.   No adjuvant therapy was recommended.  PET scan in September was negative for metastatic disease at that time.  By June 2023, he was found to have widespread metastatic disease including to the brain.  CT scans in January, 2024, showed good response in the lung and skin. Current scans from July, 2024 continue to look good.   Metastasis to subcutaneous tissue  He presented with a new area of concern to the left mid back in June of 2023.  A superficial nodule was noted approximately 1 cm to the right of his upper thoracic spine,and it had increased in size. He was sent for biopsy and that was performed on June 13 and confirmed metastatic malignant melanoma.   He has several hypermetabolic subcutaneous nodules seen on PET scan which are responding.  Metastasis to liver He had a small hypermetabolic liver lesion which was new in June of 2023, and less than 1 cm, but suspicious for metastasis. This is no longer visible.  Metastasis to lymph nodes He had multiple hypermetabolic nodes of the retroperitoneal and right pelvic areas, as well as right external iliac and a few additional nodes. These have all resolved.  Pulmonary metastases He had bilateral  hypermetabolic pulmonary nodules which increased in size by the time treatment was started.  The largest nodule is up to 2.3 cm in diameter. CT in January, 2024 reveals good response. His latest scan shows 2 tiny 4 mm nodules which are persistent and unchanged. There are no other suspicious or new nodules.  Metastases to brain This was suspicious on the PET scan in the left frontoparietal lobe and so he had an MRI of the brain.  This confirmed a 2.6 cm heterogeneous lesion of the left superior temporal gyrus with mild enhancement and extensive surrounding edema.  He also has an 8 mm cortical lesion of the inferomedial right frontal lobe with mild edema, a 2 mm mildly enhancing cortical lesion of the inferolateral left frontal lobe and a intrinsically T1 hyperintense lesion of the left lateral pons measuring 7 mm with surrounding edema.  He has 4 lesions in total, but 3 are less than 1 cm.  He has received stereotactic radiation. He had neurologic symptoms in September and was placed on dexamethasone.  MRI of the brain on 09/25/2022 revealed increased size of a hemorrhagic left pons/middle cerebellar peduncle lesion with increased surrounding edema, similar versus slightly decreased size of a 13 x 10 mm enhancing lesion in the medial right frontal lobe, and mildly diminished intrinsic T1 hyperintensity of the left temporal resection site with linear enhancement along the lateral margin, probably postsurgical/post radiation.  He has now had a brain PET scan on October 23, 2022, which does not show hyperactivity but more radiation necrosis, so these changes  are more consistent with posttreatment changes.   Family history of breast cancer Maternal family history of breast cancer in addition to his melanoma history which raises suspicion for possible BRCA mutation.  He met with the genetic counselors and [facility] RNA + hereditary cancer panel revealed variants of uncertain significance (VUS) of the APC, CDKN1C and FH  genes.  Skin Rash The actual skin rash is minimal and mainly involving his scalp, but he has severe pruritus. He was also concerned about some brown spots on his hands and has seen  a dermatologist who feels these skin changes are immune mediated. He was on dexamethasone 4 mg daily and has been tapered off completely as of 3 weeks ago.  Severe pain right lower extremity He had an MRI of the lumbar spine done on 01/16/2023 revealed a broad shallow left foraminal disc protrusion at L4 - L5 with a small anular fissure. No right foraminal stenosis. Mild left foraminal stenosis and no acute osseous injury of the lumbar spine.  I think his right leg pain could be due to neuropathy but this is usually bilateral so I am not sure about what is causing it. I recommend we have him see a Retail buyer. I sent a referral to [name] - he did his brain surgery last year but now he has this new problem.  He has persistent right leg pain and xrays and MRI do not show clear explanation for it. I have tried him on gabapentin without much benefit and it doesn't seem like typical neuropathy. He is on immunotherapy, not chemotherapy so we usually don't see neuropathy with this.     Plan His day 1 cycle 15 of Nivolumab is scheduled for 02/10/2023. I reviewed his cervical spine scans with him and it showed arthritis and degenerative changes. His labs are pending today. He had an MRI of the lumbar spine done on 01/16/2023 revealed a broad shallow left foraminal disc protrusion at L4 - L5 with a small anular fissure. No right foraminal stenosis. Mild left foraminal stenosis and no acute osseous injury of the lumbar spine.  I think his right leg pain could be due to neuropathy but this is usually bilateral so I am not sure about what is causing it. I recommend we have him see a Retail buyer. I sent a referral to [name] - he did his brain surgery last year but now he has a new problem.  He has persistent right leg pain  and xrays and MRI do not show clear explanation for it. I have tried him on gabapentin without much benefit and it doesn't seem like typical neuropathy. He is on immunotherapy, not chemotherapy so we usually don't see neuropathy with this, and it is unilateral. 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 This was determined by a PET scan of the brain. He is currently taking Gabapentin 300mg  TID but does not feel it is helping. We will see if Dr Conchita Paris recommends a higher dose or just stopping it. He requested additional medication for the pain and I prescribed hydrocodone 5 mg every 4 hrs as needed for pain. I explained this could cause constipation and is habit forming so would recommend caution with it.  He went to the dermatologist some weeks ago for some brown spots on his right hand and he was told it could be due to his immunotherapy. I will see him  back in 4 weeks  with CBC, CMP and he will have his infusion the following week. The patient understands the plans discussed today and is in agreement with them.  I have answered his questions and reviewed this information in detail.The patient knows to contact our office if he develops concerns prior to his next appointment.  I provided 25 minutes of face-to-face time during this this encounter and > 50% was spent counseling as documented under my assessment and plan.   Dellia Beckwith, MD  [facility] [address] [place] [address] Dept: [phone] Dept Fax: [phone]   Orders Placed This Encounter  Procedures   Ambulatory referral to Neurosurgery    Referral Priority:   Routine    Referral Type:   Surgical    Referral Reason:   Specialty Services Required    Referred to Provider:   Lisbeth Renshaw, MD    Requested Specialty:   Neurosurgery    Number of Visits Requested:   1    CHIEF COMPLAINT:  CC:  Metastatic melanoma  Current Treatment: Brain surgery, irradiation, followed by immunotherapy  HISTORY OF PRESENT ILLNESS:  Xavier Jones is a 52 y.o. male with stage IIB (T3b N0 M0) malignant melanoma of the right posterior shoulder diagnosed in May 2021. He reported an enlarging nevus of the right shoulder for about a year.  It began to bleed, so he saw his dermatologist.  Biopsy revealed malignant melanoma, Breslow's depth 3.3 mm, Clark level IV, however, there was no overt attachment to the overlying epidermis, so a metastatic nodule could not be ruled out.  He had several other lesions removed at the time of biopsy.  Right lower lateral back shave revealed a dysplastic compound nevus with severe atypia, limited margins free.  Right lower medial back shave revealed dysplastic nevus with moderate to severe atypia, peripheral margin involved.  Left mid buttocks shave revealed dysplastic compound nevus with moderate atypia, limited margins free.  MRI head and CT chest, abdomen and pelvis in June were negative for metastatic disease. He underwent wide local excision of the right shoulder melanoma with Dr. Vinson Moselle in June.  Pathology revealed residual dermal malignant melanoma, but margins were clear.  3 sentinel lymph nodes were negative for metastasis (0/3).  However, there was a question as to whether this represents a metastatic lesion, rather than a primary, based on the presence of 2 nodules and no epidermal involvement.  He also had excision of the lower back lesion, which did not reveal malignancy. No adjuvant therapy was recommended. PET scan in September did not reveal any findings of hypermetabolic metastatic disease, there was low level activity felt to be postoperative changes in the skin/subcutaneous tissue of the right posterior shoulder, as well as likely degenerative hypermetabolism of the proximal, posterior right femur. Due to his personal and family history of malignancy, he was  scheduled for a consultation with the genetic counselor to discuss testing for hereditary cancer syndromes. [facility] testing from August 2022 revealed variants of uncertain significance (VUS) of the APC, CDKN1C and FH gene.   Oncology History  Melanoma of skin (HCC)  12/01/2019 Initial Diagnosis   Melanoma of skin (HCC)   12/05/2019 Cancer Staging   Staging form: Melanoma of the Skin, AJCC 8th Edition - Clinical stage from 12/05/2019: Stage IIA (cT3a, cN0, cM0) - Signed by Dellia Beckwith, MD on 05/30/2020   02/18/2021 Genetic Testing   No pathogenic variants detected in [facility] Multi-Cancer +RNA Panel.  Variants of uncertain significance detected in APC (c.681C>G (p.Asp227Glu)), CDKN1C (c.610C>G (p.Pro204Ala)), and FH (c.1151C>T (p.Ala384Val)).  The report date is February 18, 2021.    The Multi-Cancer + RNA Panel offered by [facility] includes sequencing and/or deletion/duplication analysis of the following 84 genes:  AIP*, ALK, APC*, ATM*, AXIN2*, BAP1*, BARD1*, BLM*, BMPR1A*, BRCA1*, BRCA2*, BRIP1*, CASR, CDC73*, CDH1*, CDK4, CDKN1B*, CDKN1C*, CDKN2A, CEBPA, CHEK2*, CTNNA1*, DICER1*, DIS3L2*, EGFR, EPCAM, FH*, FLCN*, GATA2*, GPC3, GREM1, HOXB13, HRAS, KIT, MAX*, MEN1*, MET, MITF, MLH1*, MSH2*, MSH3*, MSH6*, MUTYH*, NBN*, NF1*, NF2*, NTHL1*, PALB2*, PDGFRA, PHOX2B, PMS2*, POLD1*, POLE*, POT1*, PRKAR1A*, PTCH1*, PTEN*, RAD50*, RAD51C*, RAD51D*, RB1*, RECQL4, RET, RUNX1*, SDHA*, SDHAF2*, SDHB*, SDHC*, SDHD*, SMAD4*, SMARCA4*, SMARCB1*, SMARCE1*, STK11*, SUFU*, TERC, TERT, TMEM127*, Tp53*, TSC1*, TSC2*, VHL*, WRN*, and WT1.  RNA analysis is performed for * genes.   01/30/2022 - 02/20/2022 Chemotherapy   Patient is on Treatment Plan : MELANOMA Nivolumab + Ipilimumab (1/3) q21d / Nivolumab q28d     01/30/2022 -  Chemotherapy   Patient is on Treatment Plan : MELANOMA Nivolumab (480) q28d     Malignant melanoma metastatic to brain (HCC)  12/19/2021 Initial Diagnosis   Malignant melanoma metastatic to  brain (HCC)   01/30/2022 - 02/20/2022 Chemotherapy   Patient is on Treatment Plan : MELANOMA Nivolumab + Ipilimumab (1/3) q21d / Nivolumab q28d     01/30/2022 -  Chemotherapy   Patient is on Treatment Plan : MELANOMA Nivolumab (480) q28d     Malignant melanoma metastatic to lung (HCC)  12/19/2021 Initial Diagnosis   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 Small nonocclusive segmental level pulmonary embolism in the  inferior LEFT upper lobe.  2. Marked bronchial wall thickening with areas of patchy basilar  opacities and new ground-glass opacities in the upper lobes.  Constellation of findings may be infectious bronchiolitis/bronchitis  though the possibility of drug related changes in the setting of  immunotherapy are also considered. Furthermore, given basilar  predominance and some material in basilar bronchial structures would  correlate with any risk factors for or history of aspiration.  3. Response to therapy of bilateral metastatic lesions. Some lymph  nodes in the chest with interval increase in size are equivocal at  this time, potentially reactive, but warrant attention on subsequent  imaging.    07/22/2022 Imaging   CT chest, abdomen and pelvis:  IMPRESSION:  1. Interval positive response to therapy. Resolved subcarinal and  bilateral hilar lymphadenopathy. Stable to decreased left pulmonary  nodules. No new or progressive metastatic disease in the chest,  abdomen or pelvis.  2. One vessel coronary atherosclerosis.  3. Aortic Atherosclerosis (ICD10-I70.0).    Malignant neoplasm metastatic to liver (HCC)  12/19/2021 Initial Diagnosis   Malignant neoplasm metastatic to liver (HCC)    01/30/2022 - 02/20/2022 Chemotherapy   Patient is on Treatment Plan : MELANOMA Nivolumab + Ipilimumab (1/3) q21d / Nivolumab q28d     01/30/2022 -  Chemotherapy   Patient is on Treatment Plan : MELANOMA Nivolumab (480) q28d     07/22/2022 Imaging   CT chest, abdomen and pelvis:  IMPRESSION:  1. Interval positive response to therapy. Resolved subcarinal and  bilateral hilar lymphadenopathy. Stable  to decreased left pulmonary  nodules. No new or progressive metastatic disease in the chest,  abdomen or pelvis.  2. One vessel coronary atherosclerosis.  3. Aortic Atherosclerosis (ICD10-I70.0).      INTERVAL HISTORY:  Xavier Jones is here today for repeat clinical assessment of his metastatic melanoma. Patient states that He feels okay but still has chronic right leg pain which he rates 7/10. He is completely off steroid medication now. His day 1 cycle 15 of Nivolumab is scheduled for 02/10/2023. I reviewed his cervical spine scans with him and it showed arthritis and degenerative changes. His labs are pending today. He had an MRI of the lumbar spine done on 01/16/2023 revealed a broad shallow left foraminal disc protrusion at L4 - L5 with a small anular fissure. No right foraminal stenosis. Mild left foraminal stenosis and no acute osseous injury of the lumbar spine.  I think his right leg pain could be due to neuropathy but this is usually bilateral so I am not sure about what is causing it. I recommend we have him see a neurosurgeon. I sent a referral to [name] - he did his brain surgery last year but now he has this new problem.  He has persistent right leg pain and xrays and MRI do not show clear explanation for it. I have tried him on gabapentin without much benefit and it doesn't seem like typical neuropathy. He is on immunotherapy, not chemotherapy so we usually don't see neuropathy with this, and it is unilateral. Also would like him to f/u on brain MRI's, he has changes which we feel are  radiation necrosis. This was determined by a PET scan of the brain. He is currently taking Gabapentin 300mg  TID but does not feel it is helping. We will see if Dr Conchita Paris recommends a higher dose or just stopping it. He requested additional medication for the pain and I prescribed hydrocodone 5 mg every 4 hrs as needed for pain. I explained this could cause constipation and is habit forming so would recommend caution with it.  He went to the dermatologist some weeks ago for some brown spots on his right hand and he was told it could be due to his immunotherapy. I will see him  back in 4 weeks with CBC, CMP and he will have his infusion the following week.  He  denies signs of infections such as sore throat, sinus drainage, cough or urinary symptoms. He  denies fever or recurrent chills. He  also deny nausea, vomiting, chest pain, or dyspnea. His  appetite is not like it used to be and His  weight has decreased 5 pounds over last 4 weeks      I have reviewed the past medical history, past surgical history, social history and family history with the patient and they are unchanged from previous note.  ALLERGIES:  is allergic to sulfa antibiotics and testosterone.  MEDICATIONS:  Current Outpatient Medications  Medication Sig Dispense Refill   acebutolol (SECTRAL) 200 MG capsule Take 1 capsule (200 mg total) by mouth daily. 90 capsule 3   acyclovir (ZOVIRAX) 400 MG tablet Take 400 mg by mouth 3 (three) times daily.     amitriptyline (ELAVIL) 25 MG tablet Take 50 mg by mouth at bedtime.     ergocalciferol (VITAMIN D2) 1.25 MG (50000 UT) capsule Take 50,000 Units by mouth every Wednesday.     gabapentin (NEURONTIN) 300 MG capsule Take 1 capsule (300 mg total) by mouth 3 (three) times daily. 90  capsule 5   hydrOXYzine (ATARAX) 25 MG tablet TAKE ONE TABLET BY MOUTH 3 TIMES A DAY AS NEEDED FOR ITCHING 60 tablet 3   lidocaine-prilocaine (EMLA) cream Apply 1 Application topically as needed. 30 g 0    pentoxifylline (TRENTAL) 400 MG CR tablet Take 1 tablet (400 mg total) by mouth in the morning and at bedtime. 60 tablet 3   rivaroxaban (XARELTO) 20 MG TABS tablet Take 1 tablet (20 mg total) by mouth daily with supper. Start after completing 3 weeks of Xarelto 15 mg twice daily 30 tablet 3   vitamin E 180 MG (400 UNITS) capsule Take 1 capsule (400 Units total) by mouth daily. 30 capsule 2   No current facility-administered medications for this visit.    REVIEW OF SYSTEMS:   Review of Systems  Constitutional: Negative.  Negative for appetite change, chills, diaphoresis, fatigue, fever and unexpected weight change.  HENT:   Positive for tinnitus. Negative for hearing loss, lump/mass, mouth sores, nosebleeds, sore throat, trouble swallowing and voice change.        Facial numbness of the left side with left facial droop  Eyes:  Positive for eye problems (cant close left eye). Negative for icterus.       States his vision are blurry  Respiratory: Negative.  Negative for chest tightness, cough, hemoptysis, shortness of breath and wheezing.   Cardiovascular: Negative.  Negative for chest pain, leg swelling and palpitations.  Gastrointestinal: Negative.  Negative for abdominal distention, abdominal pain, blood in stool, constipation, diarrhea, nausea, rectal pain and vomiting.  Endocrine: Negative.  Negative for hot flashes.  Genitourinary: Negative.  Negative for bladder incontinence, difficulty urinating, dyspareunia, dysuria, frequency, hematuria, nocturia, pelvic pain and penile discharge.   Musculoskeletal: Negative.  Negative for arthralgias, back pain, flank pain, gait problem, myalgias, neck pain and neck stiffness.       Severe right  lower extremity pain  Skin: Negative.  Negative for itching, rash and wound.  Neurological:  Positive for dizziness, headaches (comes in waves), numbness (painful neuropathy in his legs) and speech difficulty. Negative for extremity weakness, gait problem,  light-headedness and seizures.  Hematological: Negative.  Negative for adenopathy. Does not bruise/bleed easily.  Psychiatric/Behavioral: Negative.  Negative for confusion, decreased concentration, depression, sleep disturbance and suicidal ideas. The patient is not nervous/anxious.    Marland Kitchen   VITALS:  Blood pressure 129/78, pulse 87, temperature 97.8 F (36.6 C), temperature source Oral, resp. rate 18, height 5\' 9"  (1.753 m), weight 178 lb 9.6 oz (81 kg), SpO2 98%.  Wt Readings from Last 3 Encounters:  02/10/23 182 lb 1.3 oz (82.6 kg)  02/04/23 178 lb 9.6 oz (81 kg)  01/13/23 183 lb 1.3 oz (83 kg)    Body mass index is 26.37 kg/m.  Performance status (ECOG): 1 - Symptomatic but completely ambulatory  PHYSICAL EXAM:    Physical Exam Vitals and nursing note reviewed.  Constitutional:      General: He is not in acute distress.    Appearance: Normal appearance. He is normal weight. He is not ill-appearing, toxic-appearing or diaphoretic.  HENT:     Head: Normocephalic and atraumatic.     Comments: Swelling at the left anterior head of the clavicle    Right Ear: Tympanic membrane, ear canal and external ear normal. There is no impacted cerumen.     Left Ear: Tympanic membrane, ear canal and external ear normal. There is no impacted cerumen.     Nose: Nose normal. No congestion or  rhinorrhea.     Mouth/Throat:     Mouth: Mucous membranes are moist.     Pharynx: Oropharynx is clear. No oropharyngeal exudate or posterior oropharyngeal erythema.  Eyes:     General: No scleral icterus.       Right eye: No discharge.        Left eye: No discharge.     Extraocular Movements: Extraocular movements intact.     Conjunctiva/sclera: Conjunctivae normal.     Pupils: Pupils are equal, round, and reactive to light.  Neck:     Vascular: No carotid bruit.  Cardiovascular:     Rate and Rhythm: Normal rate and regular rhythm.     Pulses: Normal pulses.     Heart sounds: Normal heart sounds. No  murmur heard.    No friction rub. No gallop.  Pulmonary:     Effort: Pulmonary effort is normal. No respiratory distress.     Breath sounds: Normal breath sounds. No stridor. No wheezing, rhonchi or rales.  Chest:     Chest wall: No tenderness.  Abdominal:     General: Bowel sounds are normal. There is no distension.     Palpations: Abdomen is soft. There is no hepatomegaly, splenomegaly or mass.     Tenderness: There is no abdominal tenderness. There is no right CVA tenderness, left CVA tenderness, guarding or rebound.     Hernia: No hernia is present.  Musculoskeletal:        General: No swelling, tenderness, deformity or signs of injury. Normal range of motion.     Cervical back: Normal range of motion and neck supple. No rigidity or tenderness.     Right lower leg: No edema.     Left lower leg: No edema.     Comments: Large deep scar in the right posterior shoulder that is well healed.   Lymphadenopathy:     Cervical: No cervical adenopathy.     Upper Body:     Right upper body: No supraclavicular or axillary adenopathy.     Left upper body: No supraclavicular or axillary adenopathy.     Lower Body: No right inguinal adenopathy. No left inguinal adenopathy.  Skin:    General: Skin is warm and dry.     Coloration: Skin is not jaundiced.     Findings: No bruising, lesion or rash.  Neurological:     General: No focal deficit present.     Mental Status: He is alert and oriented to person, place, and time. Mental status is at baseline.     Cranial Nerves: Facial asymmetry present. No cranial nerve deficit.     Sensory: No sensory deficit.     Motor: No weakness.     Coordination: Coordination normal.     Gait: Gait normal.     Deep Tendon Reflexes: Reflexes normal.     Comments: Left facial droop  Psychiatric:        Mood and Affect: Mood normal.        Behavior: Behavior normal.        Thought Content: Thought content normal.        Judgment: Judgment normal.     LABORATORY DATA:  I have reviewed the data as listed    Component Value Date/Time   NA 140 02/04/2023 1401   NA 143 01/05/2023 0000   K 3.8 02/04/2023 1401   CL 103 02/04/2023 1401   CO2 27 02/04/2023 1401   GLUCOSE 182 (H) 02/04/2023 1401   BUN 16  02/04/2023 1401   BUN 9 01/05/2023 0000   CREATININE 1.08 02/04/2023 1401   CALCIUM 9.4 02/04/2023 1401   PROT 7.4 02/04/2023 1401   PROT 6.2 (A) 05/08/2022 0000   ALBUMIN 3.6 02/04/2023 1401   AST 15 02/04/2023 1401   ALT 12 02/04/2023 1401   ALKPHOS 65 02/04/2023 1401   BILITOT 0.4 02/04/2023 1401   GFRNONAA >60 02/04/2023 1401   No results found for: "SPEP", "UPEP"  Lab Results  Component Value Date   WBC 7.4 02/04/2023   NEUTROABS 4.8 02/04/2023   HGB 13.0 02/04/2023   HCT 40.0 02/04/2023   MCV 98.0 02/04/2023   PLT 411 (H) 02/04/2023     Chemistry      Component Value Date/Time   NA 140 02/04/2023 1401   NA 143 01/05/2023 0000   K 3.8 02/04/2023 1401   CL 103 02/04/2023 1401   CO2 27 02/04/2023 1401   BUN 16 02/04/2023 1401   BUN 9 01/05/2023 0000   CREATININE 1.08 02/04/2023 1401   GLU 95 01/05/2023 0000      Component Value Date/Time   CALCIUM 9.4 02/04/2023 1401   ALKPHOS 65 02/04/2023 1401   AST 15 02/04/2023 1401   ALT 12 02/04/2023 1401   BILITOT 0.4 02/04/2023 1401     Component Ref Range & Units 12/11/2022 1 mo ago 2 mo ago 3 mo ago 4 mo ago 5 mo ago 6 mo ago  T4, Total 4.5 - 12.0 ug/dL 8.1 7.7 CM 7.4 CM 8.3 CM 7.4 CM 7.0 CM 7.2 CM   Component Ref Range & Units 12/11/2022   1 mo ago 2 mo ago 3 mo ago 4 mo ago 5 mo ago 6 mo ago  TSH 0.350 - 4.500 uIU/mL 1.371 0.804 CM 0.790 CM 0.757 CM 0.761 CM 1.550 CM 1.581 CM    RADIOGRAPHIC STUDIES: I have personally reviewed the radiological images as listed and agreed with the findings in the report. No results found.  EXAM:  01/16/2023 MRI OF THE LUMBAR SPINE WITHOUT AND WITH CONTRAST IMPRESSION A T L4-L5 there is a broad shallow left  foraminal disc protrusion with a small anular fissure. No right foraminal stenosis. Mild left foraminal stenosis  No acute osseous injury of the lumbar spine.   EXAM - 01/05/2023 CT CHEST ABDOMEN AND PELVIS WITH CONTRAST IMPRESSION Subcarinal and bilateral hilar lymphadenopathy remains resolved. Unchanged small pulmonary nodules of the left lung. No new nodules. Unchanged L1 corpectomy with lateral bridging fusion of T12 through L2. No evidence of new metastatic disease in the chest, abdomen, or pelvis. Coronary artery disease.   EXAM: 11/19/2022 MRI HEAD WITHOUT AND WITH CONTRAST IMPRESSION: 1. No progression of metastatic disease. 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 Two other enhancing treatment sites remain stable.   2. No new metastatic disease or acute intracranial abnormality is identified.  EXAM: 10/23/2022 NM PET METABOLIC BRAIN  IMPRESSION: No increased metabolic activity associated with the LEFT brainstem lesion identified on coregistered MRI. 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    EXAM: 09/25/2022 MRI HEAD WITHOUT AND WITH CONTRAST IMPRESSION: 1. Increased size of a hemorrhagic left pons/middle cerebellar peduncle lesion with increased surrounding edema, described above. 2. Similar versus slightly decreased size of a 13 x 10 mm enhancing lesion in the medial right frontal lobe. 3. Mildly diminished intrinsic T1 hyperintensity of the left temporal resection site with linear enhancement along the lateral margin, probably postsurgical. Recommend continued attention on  follow-up.        I,Oluwatobi Asade,acting as a scribe for Dellia Beckwith, MD.,have documented all relevant documentation on the behalf of Dellia Beckwith, MD,as directed by  Dellia Beckwith, MD while in the presence of Dellia Beckwith, MD.

## 2023-02-05 ENCOUNTER — Encounter: Payer: Self-pay | Admitting: Oncology

## 2023-02-06 ENCOUNTER — Encounter: Payer: Self-pay | Admitting: Hematology and Oncology

## 2023-02-09 MED FILL — Nivolumab IV Soln 240 MG/24ML: INTRAVENOUS | Qty: 48 | Status: AC

## 2023-02-10 ENCOUNTER — Inpatient Hospital Stay: Payer: Medicare Other | Attending: Oncology

## 2023-02-10 ENCOUNTER — Other Ambulatory Visit: Payer: Self-pay | Admitting: Oncology

## 2023-02-10 ENCOUNTER — Encounter: Payer: Self-pay | Admitting: Oncology

## 2023-02-10 VITALS — BP 124/88 | HR 84 | Temp 97.9°F | Wt 182.1 lb

## 2023-02-10 DIAGNOSIS — C7801 Secondary malignant neoplasm of right lung: Secondary | ICD-10-CM | POA: Diagnosis not present

## 2023-02-10 DIAGNOSIS — C7800 Secondary malignant neoplasm of unspecified lung: Secondary | ICD-10-CM

## 2023-02-10 DIAGNOSIS — Z79620 Long term (current) use of immunosuppressive biologic: Secondary | ICD-10-CM | POA: Insufficient documentation

## 2023-02-10 DIAGNOSIS — C787 Secondary malignant neoplasm of liver and intrahepatic bile duct: Secondary | ICD-10-CM | POA: Diagnosis not present

## 2023-02-10 DIAGNOSIS — C439 Malignant melanoma of skin, unspecified: Secondary | ICD-10-CM

## 2023-02-10 DIAGNOSIS — C7931 Secondary malignant neoplasm of brain: Secondary | ICD-10-CM | POA: Diagnosis not present

## 2023-02-10 DIAGNOSIS — Z5112 Encounter for antineoplastic immunotherapy: Secondary | ICD-10-CM | POA: Insufficient documentation

## 2023-02-10 DIAGNOSIS — C7802 Secondary malignant neoplasm of left lung: Secondary | ICD-10-CM | POA: Insufficient documentation

## 2023-02-10 DIAGNOSIS — C4361 Malignant melanoma of right upper limb, including shoulder: Secondary | ICD-10-CM | POA: Insufficient documentation

## 2023-02-10 DIAGNOSIS — C779 Secondary and unspecified malignant neoplasm of lymph node, unspecified: Secondary | ICD-10-CM | POA: Insufficient documentation

## 2023-02-10 DIAGNOSIS — M79604 Pain in right leg: Secondary | ICD-10-CM

## 2023-02-10 MED ORDER — HEPARIN SOD (PORK) LOCK FLUSH 100 UNIT/ML IV SOLN
500.0000 [IU] | Freq: Once | INTRAVENOUS | Status: AC | PRN
  Administered 2023-02-10: 500 [IU]

## 2023-02-10 MED ORDER — SODIUM CHLORIDE 0.9 % IV SOLN: Freq: Once | INTRAVENOUS | Status: AC

## 2023-02-10 MED ORDER — SODIUM CHLORIDE 0.9% FLUSH
10.0000 mL | INTRAVENOUS | Status: DC | PRN
  Administered 2023-02-10: 10 mL

## 2023-02-10 MED ORDER — SODIUM CHLORIDE 0.9 % IV SOLN
480.0000 mg | Freq: Once | INTRAVENOUS | Status: AC
  Administered 2023-02-10: 480 mg via INTRAVENOUS
  Filled 2023-02-10: qty 48

## 2023-02-10 NOTE — Patient Instructions (Signed)
Nivolumab Injection What is this medication? NIVOLUMAB (nye VOL ue mab) treats some types of cancer. It works by helping your immune system slow or stop the spread of cancer cells. It is a monoclonal antibody. This medicine may be used for other purposes; ask your health care provider or pharmacist if you have questions. COMMON BRAND NAME(S): Opdivo What should I tell my care team before I take this medication? They need to know if you have any of these conditions: Allogeneic stem cell transplant (uses someone else's stem cells) Autoimmune diseases, such as Crohn disease, ulcerative colitis, lupus History of chest radiation Nervous system problems, such as Guillain-Barre syndrome or myasthenia gravis Organ transplant An unusual or allergic reaction to nivolumab, other medications, foods, dyes, or preservatives Pregnant or trying to get pregnant Breast-feeding How should I use this medication? This medication is infused into a vein. It is given in a hospital or clinic setting. A special MedGuide will be given to you before each treatment. Be sure to read this information carefully each time. Talk to your care team about the use of this medication in children. While it may be prescribed for children as young as 12 years for selected conditions, precautions do apply. Overdosage: If you think you have taken too much of this medicine contact a poison control center or emergency room at once. NOTE: This medicine is only for you. Do not share this medicine with others. What if I miss a dose? Keep appointments for follow-up doses. It is important not to miss your dose. Call your care team if you are unable to keep an appointment. What may interact with this medication? Interactions have not been studied. This list may not describe all possible interactions. Give your health care provider a list of all the medicines, herbs, non-prescription drugs, or dietary supplements you use. Also tell them if you  smoke, drink alcohol, or use illegal drugs. Some items may interact with your medicine. What should I watch for while using this medication? Your condition will be monitored carefully while you are receiving this medication. You may need blood work while taking this medication. This medication may cause serious skin reactions. They can happen weeks to months after starting the medication. Contact your care team right away if you notice fevers or flu-like symptoms with a rash. The rash may be red or purple and then turn into blisters or peeling of the skin. You may also notice a red rash with swelling of the face, lips, or lymph nodes in your neck or under your arms. Tell your care team right away if you have any change in your eyesight. Talk to your care team if you are pregnant or think you might be pregnant. A negative pregnancy test is required before starting this medication. A reliable form of contraception is recommended while taking this medication and for 5 months after the last dose. Talk to your care team about effective forms of contraception. Do not breast-feed while taking this medication and for 5 months after the last dose. What side effects may I notice from receiving this medication? Side effects that you should report to your care team as soon as possible: Allergic reactions--skin rash, itching, hives, swelling of the face, lips, tongue, or throat Dry cough, shortness of breath or trouble breathing Eye pain, redness, irritation, or discharge with blurry or decreased vision Heart muscle inflammation--unusual weakness or fatigue, shortness of breath, chest pain, fast or irregular heartbeat, dizziness, swelling of the ankles, feet, or hands Hormone   gland problems--headache, sensitivity to light, unusual weakness or fatigue, dizziness, fast or irregular heartbeat, increased sensitivity to cold or heat, excessive sweating, constipation, hair loss, increased thirst or amount of urine,  tremors or shaking, irritability Infusion reactions--chest pain, shortness of breath or trouble breathing, feeling faint or lightheaded Kidney injury (glomerulonephritis)--decrease in the amount of urine, red or dark brown urine, foamy or bubbly urine, swelling of the ankles, hands, or feet Liver injury--right upper belly pain, loss of appetite, nausea, light-colored stool, dark yellow or brown urine, yellowing skin or eyes, unusual weakness or fatigue Pain, tingling, or numbness in the hands or feet, muscle weakness, change in vision, confusion or trouble speaking, loss of balance or coordination, trouble walking, seizures Rash, fever, and swollen lymph nodes Redness, blistering, peeling, or loosening of the skin, including inside the mouth Sudden or severe stomach pain, bloody diarrhea, fever, nausea, vomiting Side effects that usually do not require medical attention (report these to your care team if they continue or are bothersome): Bone, joint, or muscle pain Diarrhea Fatigue Loss of appetite Nausea Skin rash This list may not describe all possible side effects. Call your doctor for medical advice about side effects. You may report side effects to FDA at 1-800-FDA-1088. Where should I keep my medication? This medication is given in a hospital or clinic. It will not be stored at home. NOTE: This sheet is a summary. It may not cover all possible information. If you have questions about this medicine, talk to your doctor, pharmacist, or health care provider.  2024 [employer] Standard (2021-10-21 00:00:00)  

## 2023-02-11 ENCOUNTER — Encounter: Payer: Self-pay | Admitting: Oncology

## 2023-02-11 ENCOUNTER — Other Ambulatory Visit: Payer: Self-pay | Admitting: Oncology

## 2023-02-11 DIAGNOSIS — M79604 Pain in right leg: Secondary | ICD-10-CM

## 2023-02-13 ENCOUNTER — Encounter: Payer: Self-pay | Admitting: Oncology

## 2023-02-13 ENCOUNTER — Telehealth: Payer: Self-pay | Admitting: Hematology and Oncology

## 2023-02-13 ENCOUNTER — Telehealth: Payer: Self-pay

## 2023-02-13 ENCOUNTER — Other Ambulatory Visit: Payer: Self-pay | Admitting: Hematology and Oncology

## 2023-02-13 DIAGNOSIS — M79604 Pain in right leg: Secondary | ICD-10-CM

## 2023-02-13 MED ORDER — HYDROCODONE-ACETAMINOPHEN 5-325 MG PO TABS: 1.0000 | ORAL_TABLET | ORAL | 0 refills | Status: DC | PRN

## 2023-02-13 NOTE — Telephone Encounter (Signed)
The patient telephoned in to request a refill of his pain medication, so I attempted to contact him again.  He he did answer and stated that he is taking the hydrocodone/APAP about 4 times a day.  I therefore sent in a refill with #100.

## 2023-02-13 NOTE — Telephone Encounter (Signed)
-----   Message from Adah Perl sent at 02/13/2023  5:00 PM EDT ----- Regarding: RE: Pain Medication Done, thanks ----- Message ----- From: Dyane Dustman, RN Sent: 02/13/2023   4:23 PM EDT To: Adah Perl, PA-C Subject: Pain Medication                                Needs refill on hydrocodone 5/325mg .  Please send to [facility]

## 2023-02-13 NOTE — Telephone Encounter (Signed)
Telephoned patient to discuss use of pain medication to determine refill.  Left message for him to call.

## 2023-02-16 ENCOUNTER — Telehealth: Payer: Self-pay

## 2023-02-16 NOTE — Telephone Encounter (Signed)
-----   Message from Adah Perl sent at 02/13/2023  5:00 PM EDT ----- Regarding: RE: Pain Medication Done, thanks ----- Message ----- From: Dyane Dustman, RN Sent: 02/13/2023   4:23 PM EDT To: Adah Perl, PA-C Subject: Pain Medication                                Needs refill on hydrocodone 5/325mg .  Please send to [facility]

## 2023-02-19 ENCOUNTER — Ambulatory Visit
Admission: RE | Admit: 2023-02-19 | Discharge: 2023-02-19 | Disposition: A | Discharge status: Home / Self Care | Payer: Medicare Other | Source: Ambulatory Visit | Attending: Internal Medicine | Admitting: Internal Medicine

## 2023-02-19 DIAGNOSIS — C439 Malignant melanoma of skin, unspecified: Secondary | ICD-10-CM | POA: Diagnosis not present

## 2023-02-19 DIAGNOSIS — C7931 Secondary malignant neoplasm of brain: Secondary | ICD-10-CM | POA: Diagnosis not present

## 2023-02-19 MED ORDER — GADOPICLENOL 0.5 MMOL/ML IV SOLN: 7.5000 mL | Freq: Once | INTRAVENOUS | Status: DC | PRN

## 2023-02-19 MED ORDER — GADOPICLENOL 0.5 MMOL/ML IV SOLN
7.5000 mL | Freq: Once | INTRAVENOUS | Status: AC | PRN
  Administered 2023-02-19: 7.5 mL via INTRAVENOUS

## 2023-02-23 ENCOUNTER — Inpatient Hospital Stay: Payer: Medicare Other | Attending: Internal Medicine | Admitting: Internal Medicine

## 2023-02-23 VITALS — BP 130/91 | HR 96 | Temp 97.5°F | Resp 20 | Wt 179.3 lb

## 2023-02-23 DIAGNOSIS — Y842 Radiological procedure and radiotherapy as the cause of abnormal reaction of the patient, or of later complication, without mention of misadventure at the time of the procedure: Secondary | ICD-10-CM | POA: Insufficient documentation

## 2023-02-23 DIAGNOSIS — C7931 Secondary malignant neoplasm of brain: Secondary | ICD-10-CM

## 2023-02-23 DIAGNOSIS — C4361 Malignant melanoma of right upper limb, including shoulder: Secondary | ICD-10-CM | POA: Insufficient documentation

## 2023-02-23 DIAGNOSIS — Z79899 Other long term (current) drug therapy: Secondary | ICD-10-CM | POA: Diagnosis not present

## 2023-02-23 DIAGNOSIS — I6789 Other cerebrovascular disease: Secondary | ICD-10-CM

## 2023-02-23 DIAGNOSIS — G629 Polyneuropathy, unspecified: Secondary | ICD-10-CM | POA: Diagnosis not present

## 2023-02-23 HISTORY — DX: Radiological procedure and radiotherapy as the cause of abnormal reaction of the patient, or of later complication, without mention of misadventure at the time of the procedure: Y84.2

## 2023-02-23 NOTE — Progress Notes (Signed)
[facility] [address]  [place], [place] [address] [phone]   Interval Evaluation  Date of Service: 02/23/23 Patient Name: Xavier Jones Patient MRN: [id number] Patient DOB: [birth year] Provider: Henreitta Leber, MD  Identifying Statement:  Xavier Jones is a 52 y.o. male with No diagnosis found.   Primary Cancer:  Oncologic History: Oncology History  Melanoma of skin (HCC)  12/01/2019 Initial Diagnosis   Melanoma of skin (HCC)   12/05/2019 Cancer Staging   Staging form: Melanoma of the Skin, AJCC 8th Edition - Clinical stage from 12/05/2019: Stage IIA (cT3a, cN0, cM0) - Signed by Dellia Beckwith, MD on 05/30/2020   02/18/2021 Genetic Testing   No pathogenic variants detected in [facility] Multi-Cancer +RNA Panel.  Variants of uncertain significance detected in APC (c.681C>G (p.Asp227Glu)), CDKN1C (c.610C>G (p.Pro204Ala)), and FH (c.1151C>T (p.Ala384Val)).  The report date is February 18, 2021.    The Multi-Cancer + RNA Panel offered by [facility] includes sequencing and/or deletion/duplication analysis of the following 84 genes:  AIP*, ALK, APC*, ATM*, AXIN2*, BAP1*, BARD1*, BLM*, BMPR1A*, BRCA1*, BRCA2*, BRIP1*, CASR, CDC73*, CDH1*, CDK4, CDKN1B*, CDKN1C*, CDKN2A, CEBPA, CHEK2*, CTNNA1*, DICER1*, DIS3L2*, EGFR, EPCAM, FH*, FLCN*, GATA2*, GPC3, GREM1, HOXB13, HRAS, KIT, MAX*, MEN1*, MET, MITF, MLH1*, MSH2*, MSH3*, MSH6*, MUTYH*, NBN*, NF1*, NF2*, NTHL1*, PALB2*, PDGFRA, PHOX2B, PMS2*, POLD1*, POLE*, POT1*, PRKAR1A*, PTCH1*, PTEN*, RAD50*, RAD51C*, RAD51D*, RB1*, RECQL4, RET, RUNX1*, SDHA*, SDHAF2*, SDHB*, SDHC*, SDHD*, SMAD4*, SMARCA4*, SMARCB1*, SMARCE1*, STK11*, SUFU*, TERC, TERT, TMEM127*, Tp53*, TSC1*, TSC2*, VHL*, WRN*, and WT1.  RNA analysis is performed for * genes.   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 Small nonocclusive segmental level pulmonary embolism in the  inferior LEFT upper lobe.  2. Marked bronchial wall thickening with areas of patchy basilar  opacities and new ground-glass opacities in the upper lobes.  Constellation of findings may be infectious bronchiolitis/bronchitis  though the possibility of drug related changes in the setting of  immunotherapy are also considered. Furthermore, given basilar  predominance and some material in basilar bronchial structures would  correlate with any risk factors for or history of aspiration.  3. Response to therapy of bilateral metastatic lesions. Some lymph  nodes in the chest with interval increase in size are equivocal at  this time, potentially reactive, but warrant attention on subsequent  imaging.    07/22/2022 Imaging   CT chest, abdomen and pelvis:  IMPRESSION:  1. Interval positive response  to therapy. Resolved subcarinal and  bilateral hilar lymphadenopathy. Stable to decreased left pulmonary  nodules. No new or progressive metastatic disease in the chest,  abdomen or pelvis.  2. One vessel coronary atherosclerosis.   3. Aortic Atherosclerosis (ICD10-I70.0).    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 Interval positive response to therapy. Resolved subcarinal and  bilateral hilar lymphadenopathy. Stable to decreased left pulmonary  nodules. No new or progressive metastatic disease in the chest,  abdomen or pelvis.  2. One vessel coronary atherosclerosis.  3. Aortic Atherosclerosis (ICD10-I70.0).     CNS Oncologic History 01/02/22: SRS to 4 metastases, including pre-op L temporal Kathrynn Running) 01/03/22: Craniotomy, resection L temporal Conchita Paris)  Interval History: Xavier Jones presents for follow up after recent MRI brain.  His left sided facial weakness is unchanged for the most part, continues to have some fasciculations asp rior.  He remains unable to close his eyelid.  He is using saline drops to keep the eye moist.  No longer dosing decadron.  Still complains of neuropathy type in his right leg.  Continues on nivolumab with Dr. Gilman Buttner, most recently was infused last week.  H+P (03/31/22) Patient presents today after 3 month post-SRS and craniotomy follow up.  He developed left sided weakness, mainly facial droop, 1 week ago.  Denies arm or leg weakness during that time.  He was started on decadron, dosed 4mg  TID x3 days, then down to 4mg  daily thereafter.  His weakness has improved back to near baseline.  He has no other complaints today, no seizures, no issues with gait.  Medications: Current Outpatient Medications on File Prior to Visit  Medication Sig Dispense Refill   acebutolol (SECTRAL) 200 MG capsule Take 1  capsule (200 mg total) by mouth daily. 90 capsule 3   acyclovir (ZOVIRAX) 400 MG tablet Take 400 mg by mouth 3 (three) times daily.     amitriptyline (ELAVIL) 25 MG tablet Take 50 mg by mouth at bedtime.     doxycycline (VIBRAMYCIN) 50 MG capsule      ergocalciferol (VITAMIN D2) 1.25 MG (50000 UT) capsule Take 50,000 Units by mouth every Wednesday.     gabapentin (NEURONTIN) 300 MG capsule Take 1 capsule (300 mg total) by mouth 3 (three) times daily. 90 capsule 5   HYDROcodone-acetaminophen (NORCO) 5-325 MG tablet Take 1 tablet by mouth every 4 (four) hours as needed for moderate pain. 100 tablet 0   hydrOXYzine (ATARAX) 25 MG tablet TAKE ONE TABLET BY MOUTH 3 TIMES A DAY AS NEEDED FOR ITCHING 60 tablet 3   lidocaine-prilocaine (EMLA) cream Apply 1 Application topically as needed. 30 g 0   pentoxifylline (TRENTAL) 400 MG CR tablet Take 1 tablet (400 mg total) by mouth in the morning and at bedtime. 60 tablet 3   rivaroxaban (XARELTO) 20 MG TABS tablet Take 1 tablet (20 mg total) by mouth daily with supper. Start after completing 3 weeks of Xarelto 15 mg twice daily 30 tablet 3   vitamin E 180 MG (400 UNITS) capsule Take 1 capsule (400 Units total) by mouth daily. 30  capsule 2   No current facility-administered medications on file prior to visit.    Allergies:  Allergies  Allergen Reactions   Sulfa Antibiotics Rash   Testosterone Itching    Gel   Past Medical History:  Past Medical History:  Diagnosis Date   Acute pulmonary embolism without acute cor pulmonale (HCC) 05/01/2022   Anemia 05/20/2022   Depression    mild   Family history of breast cancer 01/22/2021   Family history of prostate cancer 01/22/2021   Genital herpes    History of back injury 2003   Pruritic rash 02/18/2022   Skin cancer    melanoma   Past Surgical History:  Past Surgical History:  Procedure Laterality Date   APPLICATION OF CRANIAL NAVIGATION N/A 01/03/2022   Procedure: APPLICATION OF CRANIAL  NAVIGATION;  Surgeon: Lisbeth Renshaw, MD;  Location: [facility] OR;  Service: Neurosurgery;  Laterality: N/A;   CRANIOTOMY Left 01/03/2022   Procedure: Stereotactic Left temporal craniotomy for resection of tumor;  Surgeon: Lisbeth Renshaw, MD;  Location: [facility] OR;  Service: Neurosurgery;  Laterality: Left;   MELANOMA EXCISION WITH SENTINEL LYMPH NODE BIOPSY Right 2021   SPINAL FUSION W/ LUQUE UNIT ROD  2003   Social History:  Social History   Socioeconomic History   Marital status: Divorced    Spouse name: Not on file   Number of children: 0   Years of education: Not on file   Highest education level: Not on file  Occupational History   Not on file  Tobacco Use   Smoking status: Former    Current packs/day: 0.00    Types: Cigarettes    Quit date: 1998    Years since quitting: 26.6   Smokeless tobacco: Never  Vaping Use   Vaping status: Never Used  Substance and Sexual Activity   Alcohol use: Yes    Alcohol/week: 14.0 standard drinks of alcohol    Types: 14 Cans of beer per week    Comment: 2-3 beers per evening   Drug use: Not Currently   Sexual activity: Not on file  Other Topics Concern   Not on file  Social History Narrative   Not on file   Social Determinants of Health   Financial Resource Strain: Not on file  Food Insecurity: Not on file  Transportation Needs: Not on file  Physical Activity: Not on file  Stress: Not on file  Social Connections: Not on file  Intimate Partner Violence: Not on file   Family History:  Family History  Problem Relation Age of Onset   Prostate cancer Father        dx 62s   Breast cancer Maternal Aunt        dx after 32; bilateral mastectomy   Stomach cancer Maternal Grandfather        dx 27s   Breast cancer Cousin 51       maternal male cousin; mets    Review of Systems: Constitutional: Doesn't report fevers, chills or abnormal weight loss Eyes: Doesn't report blurriness of vision Ears, nose, mouth, throat, and face:  Doesn't report sore throat Respiratory: Doesn't report cough, dyspnea or wheezes Cardiovascular: Doesn't report palpitation, chest discomfort  Gastrointestinal:  Doesn't report nausea, constipation, diarrhea GU: Doesn't report incontinence Skin: Doesn't report skin rashes Neurological: Per HPI Musculoskeletal: Doesn't report joint pain Behavioral/Psych: Doesn't report anxiety  Physical Exam: Vitals:   02/23/23 1100  BP: (!) 130/91  Pulse: 96  Resp: 20  Temp: (!) 97.5 F (36.4 C)  SpO2: 100%    KPS: 80. General: Alert, cooperative, pleasant, in no acute distress Head: Normal EENT: No conjunctival injection or scleral icterus.  Lungs: Resp effort normal Cardiac: Regular rate Abdomen: Non-distended abdomen Skin: No rashes cyanosis or petechiae. Extremities: No clubbing or edema  Neurologic Exam: Mental Status: Awake, alert, attentive to examiner. Oriented to self and environment. Language is fluent with intact comprehension.  Cranial Nerves: Visual acuity is grossly normal. Visual fields are full. Extra-ocular movements intact. L LMN facial paresis, dense Motor: Tone and bulk are normal. Power is full in both arms and legs. Reflexes are symmetric, no pathologic reflexes present.  Sensory: Intact to light touch Gait: Normal.   Labs: I have reviewed the data as listed    Component Value Date/Time   NA 140 02/04/2023 1401   NA 143 01/05/2023 0000   K 3.8 02/04/2023 1401   CL 103 02/04/2023 1401   CO2 27 02/04/2023 1401   GLUCOSE 182 (H) 02/04/2023 1401   BUN 16 02/04/2023 1401   BUN 9 01/05/2023 0000   CREATININE 1.08 02/04/2023 1401   CALCIUM 9.4 02/04/2023 1401   PROT 7.4 02/04/2023 1401   PROT 6.2 (A) 05/08/2022 0000   ALBUMIN 3.6 02/04/2023 1401   AST 15 02/04/2023 1401   ALT 12 02/04/2023 1401   ALKPHOS 65 02/04/2023 1401   BILITOT 0.4 02/04/2023 1401   GFRNONAA >60 02/04/2023 1401   Lab Results  Component Value Date   WBC 7.4 02/04/2023   NEUTROABS 4.8  02/04/2023   HGB 13.0 02/04/2023   HCT 40.0 02/04/2023   MCV 98.0 02/04/2023   PLT 411 (H) 02/04/2023    Imaging:  No results found.  [facility] Clinician Interpretation: I have personally reviewed the radiological images as listed.  My interpretation, in the context of the patient's clinical presentation, is stable disease pending official read   Assessment/Plan No diagnosis found.  Xavier Jones is clinically stable today, with ongoing dense left LMN facial paresis.  MRI brain actually demonstrates stable findings, ongoing contraction of enhancing volume, pending official read.   Should remain off decadron.  Ok to con't gabapentin which has helped some with the neuropathy symptoms.  We appreciate the opportunity to participate in the care of Xavier Jones.   We ask that Xavier Jones return to clinic in 4 months following next brain MRI, or sooner as needed. \ He will continue to follow with Dr. Gilman Buttner for nivolumab  All questions were answered. The patient knows to call the clinic with any problems, questions or concerns. No barriers to learning were detected.  The total time spent in the encounter was 40 minutes and more than 50% was on counseling and review of test results   Henreitta Leber, MD Medical Director of Neuro-Oncology [facility] [place] [facility] 02/23/23 11:21 AM

## 2023-02-24 ENCOUNTER — Other Ambulatory Visit: Payer: Self-pay

## 2023-02-25 ENCOUNTER — Telehealth: Payer: Self-pay | Admitting: Internal Medicine

## 2023-02-26 ENCOUNTER — Telehealth: Payer: Self-pay

## 2023-02-26 ENCOUNTER — Other Ambulatory Visit: Payer: Self-pay

## 2023-02-26 NOTE — Telephone Encounter (Signed)
Called and left message for Dr. Edward Jolly office to return call regarding referral that was sent 02/04/23 via electronically and faxed.

## 2023-03-04 ENCOUNTER — Encounter: Payer: Self-pay | Admitting: Oncology

## 2023-03-04 ENCOUNTER — Other Ambulatory Visit: Payer: Self-pay | Admitting: Oncology

## 2023-03-04 ENCOUNTER — Inpatient Hospital Stay: Payer: Medicare Other

## 2023-03-04 ENCOUNTER — Inpatient Hospital Stay (INDEPENDENT_AMBULATORY_CARE_PROVIDER_SITE_OTHER): Payer: Medicare Other | Admitting: Oncology

## 2023-03-04 VITALS — BP 138/85 | HR 65 | Temp 97.5°F | Resp 18 | Ht 69.0 in | Wt 177.0 lb

## 2023-03-04 DIAGNOSIS — C787 Secondary malignant neoplasm of liver and intrahepatic bile duct: Secondary | ICD-10-CM

## 2023-03-04 DIAGNOSIS — C7800 Secondary malignant neoplasm of unspecified lung: Secondary | ICD-10-CM

## 2023-03-04 DIAGNOSIS — G5791 Unspecified mononeuropathy of right lower limb: Secondary | ICD-10-CM

## 2023-03-04 DIAGNOSIS — C7931 Secondary malignant neoplasm of brain: Secondary | ICD-10-CM | POA: Diagnosis not present

## 2023-03-04 DIAGNOSIS — C439 Malignant melanoma of skin, unspecified: Secondary | ICD-10-CM

## 2023-03-04 DIAGNOSIS — C4361 Malignant melanoma of right upper limb, including shoulder: Secondary | ICD-10-CM | POA: Diagnosis not present

## 2023-03-04 DIAGNOSIS — C7801 Secondary malignant neoplasm of right lung: Secondary | ICD-10-CM | POA: Diagnosis not present

## 2023-03-04 DIAGNOSIS — C779 Secondary and unspecified malignant neoplasm of lymph node, unspecified: Secondary | ICD-10-CM | POA: Diagnosis not present

## 2023-03-04 DIAGNOSIS — C778 Secondary and unspecified malignant neoplasm of lymph nodes of multiple regions: Secondary | ICD-10-CM

## 2023-03-04 DIAGNOSIS — Z5112 Encounter for antineoplastic immunotherapy: Secondary | ICD-10-CM | POA: Diagnosis not present

## 2023-03-04 DIAGNOSIS — D649 Anemia, unspecified: Secondary | ICD-10-CM | POA: Diagnosis not present

## 2023-03-04 LAB — CMP (CANCER CENTER ONLY)
ALT: 22 U/L (ref 0–44)
AST: 20 U/L (ref 15–41)
Albumin: 3.9 g/dL (ref 3.5–5.0)
Alkaline Phosphatase: 74 U/L (ref 38–126)
Anion gap: 8 (ref 5–15)
BUN: 13 mg/dL (ref 6–20)
CO2: 29 mmol/L (ref 22–32)
Calcium: 9.3 mg/dL (ref 8.9–10.3)
Chloride: 101 mmol/L (ref 98–111)
Creatinine: 0.85 mg/dL (ref 0.61–1.24)
GFR, Estimated: >60 mL/min (ref >60)
Glucose, Bld: 93 mg/dL (ref 70–99)
Potassium: 3.9 mmol/L (ref 3.5–5.1)
Sodium: 138 mmol/L (ref 135–145)
Total Bilirubin: 0.5 mg/dL (ref 0.3–1.2)
Total Protein: 7.1 g/dL (ref 6.5–8.1)

## 2023-03-04 LAB — CBC AND DIFFERENTIAL
HCT: 37 — AB (ref 41–53)
Hemoglobin: 12.4 — AB (ref 13.5–17.5)
Neutrophils Absolute: 3.71
Platelets: 374 10*3/uL (ref 150–400)
WBC: 6.5

## 2023-03-04 LAB — CBC W DIFFERENTIAL (~~LOC~~ CC SCANNED REPORT)

## 2023-03-04 LAB — CBC: RBC: 3.94 (ref 3.87–5.11)

## 2023-03-04 LAB — TSH: TSH: 2.825 u[IU]/mL (ref 0.350–4.500)

## 2023-03-04 MED ORDER — GABAPENTIN 300 MG PO CAPS: 600.0000 mg | ORAL_CAPSULE | Freq: Three times a day (TID) | ORAL | 5 refills | Status: DC

## 2023-03-05 LAB — T4: T4, Total: 6.7 ug/dL (ref 4.5–12.0)

## 2023-03-06 MED FILL — Nivolumab IV Soln 240 MG/24ML: INTRAVENOUS | Qty: 48 | Status: AC

## 2023-03-10 ENCOUNTER — Inpatient Hospital Stay: Payer: Medicare Other | Attending: Hematology and Oncology

## 2023-03-10 VITALS — BP 129/78 | HR 79 | Temp 97.6°F | Resp 18 | Wt 179.1 lb

## 2023-03-10 DIAGNOSIS — C7931 Secondary malignant neoplasm of brain: Secondary | ICD-10-CM | POA: Insufficient documentation

## 2023-03-10 DIAGNOSIS — C7802 Secondary malignant neoplasm of left lung: Secondary | ICD-10-CM | POA: Diagnosis not present

## 2023-03-10 DIAGNOSIS — Z5112 Encounter for antineoplastic immunotherapy: Secondary | ICD-10-CM | POA: Insufficient documentation

## 2023-03-10 DIAGNOSIS — C787 Secondary malignant neoplasm of liver and intrahepatic bile duct: Secondary | ICD-10-CM | POA: Insufficient documentation

## 2023-03-10 DIAGNOSIS — Z79620 Long term (current) use of immunosuppressive biologic: Secondary | ICD-10-CM | POA: Insufficient documentation

## 2023-03-10 DIAGNOSIS — C778 Secondary and unspecified malignant neoplasm of lymph nodes of multiple regions: Secondary | ICD-10-CM | POA: Insufficient documentation

## 2023-03-10 DIAGNOSIS — C4361 Malignant melanoma of right upper limb, including shoulder: Secondary | ICD-10-CM | POA: Insufficient documentation

## 2023-03-10 DIAGNOSIS — C7800 Secondary malignant neoplasm of unspecified lung: Secondary | ICD-10-CM

## 2023-03-10 DIAGNOSIS — C7801 Secondary malignant neoplasm of right lung: Secondary | ICD-10-CM | POA: Diagnosis not present

## 2023-03-10 DIAGNOSIS — C439 Malignant melanoma of skin, unspecified: Secondary | ICD-10-CM

## 2023-03-10 MED ORDER — SODIUM CHLORIDE 0.9 % IV SOLN
480.0000 mg | Freq: Once | INTRAVENOUS | Status: AC
  Administered 2023-03-10: 480 mg via INTRAVENOUS
  Filled 2023-03-10: qty 48

## 2023-03-10 MED ORDER — HEPARIN SOD (PORK) LOCK FLUSH 100 UNIT/ML IV SOLN
500.0000 [IU] | Freq: Once | INTRAVENOUS | Status: AC | PRN
  Administered 2023-03-10: 500 [IU]

## 2023-03-10 MED ORDER — SODIUM CHLORIDE 0.9 % IV SOLN: Freq: Once | INTRAVENOUS | Status: AC

## 2023-03-10 MED ORDER — SODIUM CHLORIDE 0.9% FLUSH
10.0000 mL | INTRAVENOUS | Status: DC | PRN
  Administered 2023-03-10: 10 mL

## 2023-03-10 NOTE — Patient Instructions (Signed)
Nivolumab Injection What is this medication? NIVOLUMAB (nye VOL ue mab) treats some types of cancer. It works by helping your immune system slow or stop the spread of cancer cells. It is a monoclonal antibody. This medicine may be used for other purposes; ask your health care provider or pharmacist if you have questions. COMMON BRAND NAME(S): Opdivo What should I tell my care team before I take this medication? They need to know if you have any of these conditions: Allogeneic stem cell transplant (uses someone else's stem cells) Autoimmune diseases, such as Crohn disease, ulcerative colitis, lupus History of chest radiation Nervous system problems, such as Guillain-Barre syndrome or myasthenia gravis Organ transplant An unusual or allergic reaction to nivolumab, other medications, foods, dyes, or preservatives Pregnant or trying to get pregnant Breast-feeding How should I use this medication? This medication is infused into a vein. It is given in a hospital or clinic setting. A special MedGuide will be given to you before each treatment. Be sure to read this information carefully each time. Talk to your care team about the use of this medication in children. While it may be prescribed for children as young as 12 years for selected conditions, precautions do apply. Overdosage: If you think you have taken too much of this medicine contact a poison control center or emergency room at once. NOTE: This medicine is only for you. Do not share this medicine with others. What if I miss a dose? Keep appointments for follow-up doses. It is important not to miss your dose. Call your care team if you are unable to keep an appointment. What may interact with this medication? Interactions have not been studied. This list may not describe all possible interactions. Give your health care provider a list of all the medicines, herbs, non-prescription drugs, or dietary supplements you use. Also tell them if you  smoke, drink alcohol, or use illegal drugs. Some items may interact with your medicine. What should I watch for while using this medication? Your condition will be monitored carefully while you are receiving this medication. You may need blood work while taking this medication. This medication may cause serious skin reactions. They can happen weeks to months after starting the medication. Contact your care team right away if you notice fevers or flu-like symptoms with a rash. The rash may be red or purple and then turn into blisters or peeling of the skin. You may also notice a red rash with swelling of the face, lips, or lymph nodes in your neck or under your arms. Tell your care team right away if you have any change in your eyesight. Talk to your care team if you are pregnant or think you might be pregnant. A negative pregnancy test is required before starting this medication. A reliable form of contraception is recommended while taking this medication and for 5 months after the last dose. Talk to your care team about effective forms of contraception. Do not breast-feed while taking this medication and for 5 months after the last dose. What side effects may I notice from receiving this medication? Side effects that you should report to your care team as soon as possible: Allergic reactions--skin rash, itching, hives, swelling of the face, lips, tongue, or throat Dry cough, shortness of breath or trouble breathing Eye pain, redness, irritation, or discharge with blurry or decreased vision Heart muscle inflammation--unusual weakness or fatigue, shortness of breath, chest pain, fast or irregular heartbeat, dizziness, swelling of the ankles, feet, or hands Hormone   gland problems--headache, sensitivity to light, unusual weakness or fatigue, dizziness, fast or irregular heartbeat, increased sensitivity to cold or heat, excessive sweating, constipation, hair loss, increased thirst or amount of urine,  tremors or shaking, irritability Infusion reactions--chest pain, shortness of breath or trouble breathing, feeling faint or lightheaded Kidney injury (glomerulonephritis)--decrease in the amount of urine, red or dark brown urine, foamy or bubbly urine, swelling of the ankles, hands, or feet Liver injury--right upper belly pain, loss of appetite, nausea, light-colored stool, dark yellow or brown urine, yellowing skin or eyes, unusual weakness or fatigue Pain, tingling, or numbness in the hands or feet, muscle weakness, change in vision, confusion or trouble speaking, loss of balance or coordination, trouble walking, seizures Rash, fever, and swollen lymph nodes Redness, blistering, peeling, or loosening of the skin, including inside the mouth Sudden or severe stomach pain, bloody diarrhea, fever, nausea, vomiting Side effects that usually do not require medical attention (report these to your care team if they continue or are bothersome): Bone, joint, or muscle pain Diarrhea Fatigue Loss of appetite Nausea Skin rash This list may not describe all possible side effects. Call your doctor for medical advice about side effects. You may report side effects to FDA at 1-800-FDA-1088. Where should I keep my medication? This medication is given in a hospital or clinic. It will not be stored at home. NOTE: This sheet is a summary. It may not cover all possible information. If you have questions about this medicine, talk to your doctor, pharmacist, or health care provider.  2024 [employer] Standard (2021-10-21 00:00:00)  

## 2023-03-13 ENCOUNTER — Encounter: Payer: Self-pay | Admitting: Oncology

## 2023-03-13 ENCOUNTER — Other Ambulatory Visit: Payer: Self-pay | Admitting: Hematology and Oncology

## 2023-03-13 DIAGNOSIS — M5126 Other intervertebral disc displacement, lumbar region: Secondary | ICD-10-CM

## 2023-03-13 DIAGNOSIS — M79604 Pain in right leg: Secondary | ICD-10-CM

## 2023-03-13 DIAGNOSIS — G629 Polyneuropathy, unspecified: Secondary | ICD-10-CM

## 2023-03-18 DIAGNOSIS — C7931 Secondary malignant neoplasm of brain: Secondary | ICD-10-CM | POA: Diagnosis not present

## 2023-03-18 DIAGNOSIS — C439 Malignant melanoma of skin, unspecified: Secondary | ICD-10-CM | POA: Diagnosis not present

## 2023-03-19 ENCOUNTER — Encounter: Payer: Self-pay | Admitting: Oncology

## 2023-03-19 NOTE — Progress Notes (Signed)
Patient Care Team: [name], NP as PCP - General (Nurse Practitioner) Dellia Beckwith, MD as Consulting Physician (Oncology) Lance Bosch, MD as Consulting Physician (Radiation Oncology)  Clinic Day: 03/04/23  Referring physician: Dellia Beckwith, MD  ASSESSMENT & PLAN:  Assessment & Plan: Melanoma of skin [facility]) Stage IIB malignant melanoma of the right posterior shoulder, Breslow's depth 3.3 mm, Clark level IV, treated with wide local excision, diagnosed in May 2021.  There was still a question as to whether this represented a metastatic lesion, rather than a primary, based on the presence of 2 nodules and no epidermal involvement.  MRI head and CT chest, abdomen and pelvis in June were negative for metastatic disease.   No adjuvant therapy was recommended.  PET scan in September was negative for metastatic disease at that time.  By June 2023, he was found to have widespread metastatic disease including to the brain.  CT scans in January, 2024, showed good response in the lung and skin. Current scans from July, 2024 continue to look good.   Metastasis to subcutaneous tissue  He presented with a new area of concern to the left mid back in June of 2023.  A superficial nodule was noted approximately 1 cm to the right of his upper thoracic spine,and it had increased in size. He was sent for biopsy and that was performed on June 13 and confirmed metastatic malignant melanoma.   He has several hypermetabolic subcutaneous nodules seen on PET scan which are responding.  Metastasis to liver He had a small hypermetabolic liver lesion which was new in June of 2023, and less than 1 cm, but suspicious for metastasis. This is no longer visible.  Metastasis to lymph nodes He had multiple hypermetabolic nodes of the retroperitoneal and right pelvic areas, as well as right external iliac and a few additional nodes. These have all resolved.  Pulmonary metastases He had bilateral  hypermetabolic pulmonary nodules which increased in size by the time treatment was started.  The largest nodule is up to 2.3 cm in diameter. CT in January, 2024 reveals good response. His latest scan shows 2 tiny 4 mm nodules which are persistent and unchanged. There are no other suspicious or new nodules.  Metastases to brain This was suspicious on the PET scan in the left frontoparietal lobe and so he had an MRI of the brain.  This confirmed a 2.6 cm heterogeneous lesion of the left superior temporal gyrus with mild enhancement and extensive surrounding edema.  He also has an 8 mm cortical lesion of the inferomedial right frontal lobe with mild edema, a 2 mm mildly enhancing cortical lesion of the inferolateral left frontal lobe and a intrinsically T1 hyperintense lesion of the left lateral pons measuring 7 mm with surrounding edema.  He has 4 lesions in total, but 3 are less than 1 cm.  He has received stereotactic radiation. He had neurologic symptoms in September and was placed on dexamethasone.  MRI of the brain on 09/25/2022 revealed increased size of a hemorrhagic left pons/middle cerebellar peduncle lesion with increased surrounding edema, similar versus slightly decreased size of a 13 x 10 mm enhancing lesion in the medial right frontal lobe, and mildly diminished intrinsic T1 hyperintensity of the left temporal resection site with linear enhancement along the lateral margin, probably postsurgical/post radiation.  He has now had a brain PET scan on October 23, 2022, which does not show hyperactivity but more radiation necrosis, so these changes  are more consistent with posttreatment changes. I would like Dr. Val Riles opinion as well, but the appointment was never made after the referral last time. Dr. Arsenio Loader has done another MRI scan but report is still pending.  He plans to repeat it in December.   Family history of breast cancer Maternal family history of breast cancer in addition to his melanoma  history which raises suspicion for possible BRCA mutation.  He met with the genetic counselors and [facility] RNA + hereditary cancer panel revealed variants of uncertain significance (VUS) of the APC, CDKN1C and FH genes.  Skin Rash The actual skin rash is minimal and mainly involving his scalp, but he has severe pruritus. He was also concerned about some brown spots on his hands and has seen  a dermatologist who feels these skin changes are immune mediated. He was on dexamethasone 4 mg daily and has been tapered off completely.  Severe pain right lower extremity He had an MRI of the lumbar spine done on 01/16/2023 revealed a broad shallow left foraminal disc protrusion at L4 - L5 with a small anular fissure. No right foraminal stenosis. Mild left foraminal stenosis and no acute osseous injury of the lumbar spine.  I think his right leg pain could be due to neuropathy but this is usually bilateral so I am not sure about what is causing it. I recommend we have him see a Retail buyer. I sent a referral to [name] - he did his brain surgery last year but now he has this new problem.  He has persistent right leg pain and xrays and MRI do not show clear explanation for it. I have tried him on gabapentin without much benefit and it doesn't seem like typical neuropathy. He is on immunotherapy, not chemotherapy so we usually don't see neuropathy with this. He is using hydrocodone 4 daily and rates the pain as 7-8. We will follow up on the referral to [name] for his opinion regarding this pain and whether to press further with the gabapentin.    Plan His day 1 cycle 15 of Nivolumab is scheduled for next Tuesday. I reviewed his cervical spine scans with him and it showed arthritis and degenerative changes. His labs are pending today. He had an MRI of the lumbar spine done on 01/16/2023 revealed a broad shallow left foraminal disc protrusion at L4 - L5 with a small anular fissure. No right foraminal  stenosis. Mild left foraminal stenosis and no acute osseous injury of the lumbar spine.  I think his right leg pain could be due to neuropathy but this is usually bilateral so I am not sure about what is causing it. I recommend we have him see [name], the neurosurgeon for evaluation. I have tried him on gabapentin without much benefit and it doesn't seem like typical neuropathy. He is on immunotherapy, not chemotherapy so we usually don't see neuropathy with this, and it is unilateral. 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 This was determined by a PET scan of the brain. He is currently taking Gabapentin 300mg  TID but does not feel it is helping. We will see if Dr Conchita Paris recommends a higher dose or just stopping it. He requested additional medication for the pain and I prescribed hydrocodone 5 mg every 4 hrs as needed for pain. I explained this could cause constipation and is habit forming so would recommend caution with it. A repeat brain MRI  was done but report still pending 2 weeks later. I will see him  back in 4 weeks with CBC, CMP and he will have his infusion the following week. The patient understands the plans discussed today and is in agreement with them.  I have answered his questions and reviewed this information in detail.The patient knows to contact our office if he develops concerns prior to his next appointment.  I provided 25 minutes of face-to-face time during this this encounter and > 50% was spent counseling as documented under my assessment and plan.   Dellia Beckwith, MD  [facility] [address] [place] [address] Dept: [phone] Dept Fax: [phone]   Orders Placed This Encounter  Procedures   Ambulatory referral to Neurosurgery    Referral Priority:   Routine    Referral Type:   Surgical    Referral Reason:   Specialty Services  Required    Referred to Provider:   Lisbeth Renshaw, MD    Requested Specialty:   Neurosurgery    Number of Visits Requested:   1    CHIEF COMPLAINT:  CC: Metastatic melanoma  Current Treatment: Brain surgery, irradiation, followed by immunotherapy  HISTORY OF PRESENT ILLNESS:  Xavier Jones is a 52 y.o. male with stage IIB (T3b N0 M0) malignant melanoma of the right posterior shoulder diagnosed in May 2021. He reported an enlarging nevus of the right shoulder for about a year.  It began to bleed, so he saw his dermatologist.  Biopsy revealed malignant melanoma, Breslow's depth 3.3 mm, Clark level IV, however, there was no overt attachment to the overlying epidermis, so a metastatic nodule could not be ruled out.  He had several other lesions removed at the time of biopsy.  Right lower lateral back shave revealed a dysplastic compound nevus with severe atypia, limited margins free.  Right lower medial back shave revealed dysplastic nevus with moderate to severe atypia, peripheral margin involved.  Left mid buttocks shave revealed dysplastic compound nevus with moderate atypia, limited margins free.  MRI head and CT chest, abdomen and pelvis in June were negative for metastatic disease. He underwent wide local excision of the right shoulder melanoma with Dr. Vinson Moselle in June.  Pathology revealed residual dermal malignant melanoma, but margins were clear.  3 sentinel lymph nodes were negative for metastasis (0/3).  However, there was a question as to whether this represents a metastatic lesion, rather than a primary, based on the presence of 2 nodules and no epidermal involvement.  He also had excision of the lower back lesion, which did not reveal malignancy. No adjuvant therapy was recommended. PET scan in September did not reveal any findings of hypermetabolic metastatic disease, there was low level activity felt to be postoperative changes in the skin/subcutaneous tissue of the right posterior  shoulder, as well as likely degenerative hypermetabolism of the proximal, posterior right femur. Due to his personal and family history of malignancy, he was scheduled for a consultation with the genetic counselor to discuss testing for hereditary cancer syndromes. [facility] testing from August 2022 revealed variants of uncertain significance (VUS) of the APC, CDKN1C and FH gene.   Oncology History  Melanoma of skin (HCC)  12/01/2019 Initial Diagnosis   Melanoma of skin (HCC)   12/05/2019 Cancer Staging   Staging form: Melanoma of the Skin, AJCC 8th Edition - Clinical stage from 12/05/2019: Stage IIA (cT3a, cN0, cM0) - Signed by Dellia Beckwith, MD  on 05/30/2020   02/18/2021 Genetic Testing   No pathogenic variants detected in [facility] Multi-Cancer +RNA Panel.  Variants of uncertain significance detected in APC (c.681C>G (p.Asp227Glu)), CDKN1C (c.610C>G (p.Pro204Ala)), and FH (c.1151C>T (p.Ala384Val)).  The report date is February 18, 2021.    The Multi-Cancer + RNA Panel offered by [facility] includes sequencing and/or deletion/duplication analysis of the following 84 genes:  AIP*, ALK, APC*, ATM*, AXIN2*, BAP1*, BARD1*, BLM*, BMPR1A*, BRCA1*, BRCA2*, BRIP1*, CASR, CDC73*, CDH1*, CDK4, CDKN1B*, CDKN1C*, CDKN2A, CEBPA, CHEK2*, CTNNA1*, DICER1*, DIS3L2*, EGFR, EPCAM, FH*, FLCN*, GATA2*, GPC3, GREM1, HOXB13, HRAS, KIT, MAX*, MEN1*, MET, MITF, MLH1*, MSH2*, MSH3*, MSH6*, MUTYH*, NBN*, NF1*, NF2*, NTHL1*, PALB2*, PDGFRA, PHOX2B, PMS2*, POLD1*, POLE*, POT1*, PRKAR1A*, PTCH1*, PTEN*, RAD50*, RAD51C*, RAD51D*, RB1*, RECQL4, RET, RUNX1*, SDHA*, SDHAF2*, SDHB*, SDHC*, SDHD*, SMAD4*, SMARCA4*, SMARCB1*, SMARCE1*, STK11*, SUFU*, TERC, TERT, TMEM127*, Tp53*, TSC1*, TSC2*, VHL*, WRN*, and WT1.  RNA analysis is performed for * genes.   01/30/2022 - 02/20/2022 Chemotherapy   Patient is on Treatment Plan : MELANOMA Nivolumab + Ipilimumab (1/3) q21d / Nivolumab q28d     01/30/2022 -  Chemotherapy   Patient is on Treatment  Plan : MELANOMA Nivolumab (480) q28d     Malignant melanoma metastatic to brain (HCC)  12/19/2021 Initial Diagnosis   Malignant melanoma metastatic to brain (HCC)   01/30/2022 - 02/20/2022 Chemotherapy   Patient is on Treatment Plan : MELANOMA Nivolumab + Ipilimumab (1/3) q21d / Nivolumab q28d     01/30/2022 -  Chemotherapy   Patient is on Treatment Plan : MELANOMA Nivolumab (480) q28d     Malignant melanoma metastatic to lung (HCC)  12/19/2021 Initial Diagnosis   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 Small nonocclusive segmental level pulmonary embolism in the  inferior LEFT upper lobe.  2. Marked bronchial wall thickening with areas of patchy basilar  opacities and new ground-glass opacities in the upper lobes.  Constellation of findings may be infectious bronchiolitis/bronchitis  though the possibility of drug related changes in the setting of  immunotherapy are also considered. Furthermore, given basilar  predominance and some material in basilar bronchial structures would  correlate with any risk factors for or history of aspiration.  3. Response to therapy of bilateral metastatic lesions. Some lymph  nodes in the chest with interval increase in size are equivocal at  this time, potentially reactive, but warrant attention on subsequent  imaging.    07/22/2022 Imaging   CT chest, abdomen and pelvis:  IMPRESSION:  1. Interval positive response to therapy. Resolved subcarinal and  bilateral hilar lymphadenopathy. Stable to decreased left pulmonary  nodules. No new or progressive metastatic disease in the chest,  abdomen or pelvis.  2. One vessel coronary atherosclerosis.  3. Aortic  Atherosclerosis (ICD10-I70.0).    Malignant neoplasm metastatic to liver (HCC)  12/19/2021 Initial Diagnosis   Malignant neoplasm metastatic to liver [facility])   01/30/2022 - 02/20/2022 Chemotherapy   Patient is on Treatment Plan : MELANOMA Nivolumab + Ipilimumab (1/3) q21d / Nivolumab q28d     01/30/2022 -  Chemotherapy   Patient is on Treatment Plan : MELANOMA Nivolumab (480) q28d     07/22/2022 Imaging   CT chest, abdomen  and pelvis:  IMPRESSION:  1. Interval positive response to therapy. Resolved subcarinal and  bilateral hilar lymphadenopathy. Stable to decreased left pulmonary  nodules. No new or progressive metastatic disease in the chest,  abdomen or pelvis.  2. One vessel coronary atherosclerosis.  3. Aortic Atherosclerosis (ICD10-I70.0).      INTERVAL HISTORY:  Xavier Jones is here today for repeat clinical assessment of his metastatic melanoma. Patient states that He feels okay but still has chronic right leg pain which he rates 8-9/10. He is completely off steroid medication now. His day 1 cycle 15 of Nivolumab is scheduled for next week. His labs are pending today. He had an MRI of the lumbar spine done on 01/16/2023 revealed a broad shallow left foraminal disc protrusion at L4 - L5 with a small anular fissure. No right foraminal stenosis. Mild left foraminal stenosis and no acute osseous injury of the lumbar spine.  I think his right leg pain could be due to neuropathy but this is usually bilateral so I am not sure about what is causing it. I recommend we have him see a neurosurgeon. I sent a referral to [name] - but he has not heard anything about an appointment.  He did his brain surgery last year but now he has this new problem.  He has persistent right leg pain and xrays and MRI do not show clear explanation for it. I have tried him on gabapentin without much benefit and it doesn't seem like typical neuropathy. He is on immunotherapy, not chemotherapy so we usually don't see  neuropathy with this, and it is unilateral. 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 This was determined by a PET scan of the brain. He is currently taking Gabapentin 300mg  TID but does not feel it is helping. We will see if Dr Conchita Paris recommends a higher dose or just stopping it. He requested additional medication for the pain and I prescribed hydrocodone 5 mg every 4 hrs as needed for pain. I explained this could cause constipation and is habit forming so would recommend caution with it. I will see him  back in 4 weeks with CBC, CMP and he will have his infusion the following week. He  denies signs of infections such as sore throat, sinus drainage, cough or urinary symptoms. He  denies fever or recurrent chills. He  also deny nausea, vomiting, chest pain, or dyspnea. His  appetite is not like it used to be and His  weight has decreased 5 pounds over last 4 weeks      I have reviewed the past medical history, past surgical history, social history and family history with the patient and they are unchanged from previous note.  ALLERGIES:  is allergic to sulfa antibiotics and testosterone.  MEDICATIONS:  Current Outpatient Medications  Medication Sig Dispense Refill   acebutolol (SECTRAL) 200 MG capsule Take 1 capsule (200 mg total) by mouth daily. 90 capsule 3   acyclovir (ZOVIRAX) 400 MG tablet Take 400 mg by mouth 3 (three) times daily.     amitriptyline (ELAVIL) 25 MG tablet Take 50 mg by mouth at bedtime.     ergocalciferol (VITAMIN D2) 1.25 MG (50000 UT) capsule Take 50,000 Units by mouth every Wednesday.     gabapentin (NEURONTIN) 300 MG capsule Take 1 capsule (300 mg total) by mouth 3 (three) times daily. 90 capsule 5   hydrOXYzine (ATARAX) 25 MG tablet TAKE ONE TABLET BY MOUTH  3 TIMES A DAY AS NEEDED FOR ITCHING 60 tablet 3   lidocaine-prilocaine (EMLA) cream Apply 1 Application topically as needed. 30 g 0   pentoxifylline (TRENTAL) 400  MG CR tablet Take 1 tablet (400 mg total) by mouth in the morning and at bedtime. 60 tablet 3   rivaroxaban (XARELTO) 20 MG TABS tablet Take 1 tablet (20 mg total) by mouth daily with supper. Start after completing 3 weeks of Xarelto 15 mg twice daily 30 tablet 3   vitamin E 180 MG (400 UNITS) capsule Take 1 capsule (400 Units total) by mouth daily. 30 capsule 2   No current facility-administered medications for this visit.    REVIEW OF SYSTEMS:   Review of Systems  Constitutional: Negative.  Negative for appetite change, chills, diaphoresis, fatigue, fever and unexpected weight change.  HENT:   Positive for tinnitus. Negative for hearing loss, lump/mass, mouth sores, nosebleeds, sore throat, trouble swallowing and voice change.        Facial numbness of the left side with left facial droop  Eyes:  Positive for eye problems (cant close left eye). Negative for icterus.       States his vision are blurry  Respiratory: Negative.  Negative for chest tightness, cough, hemoptysis, shortness of breath and wheezing.   Cardiovascular: Negative.  Negative for chest pain, leg swelling and palpitations.  Gastrointestinal: Negative.  Negative for abdominal distention, abdominal pain, blood in stool, constipation, diarrhea, nausea, rectal pain and vomiting.  Endocrine: Negative.  Negative for hot flashes.  Genitourinary: Negative.  Negative for bladder incontinence, difficulty urinating, dyspareunia, dysuria, frequency, hematuria, nocturia, pelvic pain and penile discharge.   Musculoskeletal: Negative.  Negative for arthralgias, back pain, flank pain, gait problem, myalgias, neck pain and neck stiffness.       Severe right  lower extremity pain  Skin: Negative.  Negative for itching, rash and wound.  Neurological:  Positive for dizziness, headaches (comes in waves), numbness (painful neuropathy in his legs) and speech difficulty. Negative for extremity weakness, gait problem, light-headedness and seizures.   Hematological: Negative.  Negative for adenopathy. Does not bruise/bleed easily.  Psychiatric/Behavioral: Negative.  Negative for confusion, decreased concentration, depression, sleep disturbance and suicidal ideas. The patient is not nervous/anxious.    Marland Kitchen   VITALS:  Blood pressure 138/85, pulse 65, temperature (!) 97.5 F (36.4 C), temperature source Oral, resp. rate 18, height 5\' 9"  (1.753 m), weight 177 lb (80.3 kg), SpO2 98%.  Wt Readings from Last 3 Encounters:  03/10/23 179 lb 0.8 oz (81.2 kg)  03/04/23 177 lb (80.3 kg)  02/23/23 179 lb 4.8 oz (81.3 kg)    Body mass index is 26.14 kg/m.  Performance status (ECOG): 1 - Symptomatic but completely ambulatory  PHYSICAL EXAM:    Physical Exam Vitals and nursing note reviewed.  Constitutional:      General: He is not in acute distress.    Appearance: Normal appearance. He is normal weight. He is not ill-appearing, toxic-appearing or diaphoretic.  HENT:     Head: Normocephalic and atraumatic.     Comments: Swelling at the left anterior head of the clavicle    Right Ear: Tympanic membrane, ear canal and external ear normal. There is no impacted cerumen.     Left Ear: Tympanic membrane, ear canal and external ear normal. There is no impacted cerumen.     Nose: Nose normal. No congestion or rhinorrhea.     Mouth/Throat:     Mouth: Mucous membranes are moist.  Pharynx: Oropharynx is clear. No oropharyngeal exudate or posterior oropharyngeal erythema.  Eyes:     General: No scleral icterus.       Right eye: No discharge.        Left eye: No discharge.     Extraocular Movements: Extraocular movements intact.     Conjunctiva/sclera: Conjunctivae normal.     Pupils: Pupils are equal, round, and reactive to light.  Neck:     Vascular: No carotid bruit.  Cardiovascular:     Rate and Rhythm: Normal rate and regular rhythm.     Pulses: Normal pulses.     Heart sounds: Normal heart sounds. No murmur heard.    No friction rub.  No gallop.  Pulmonary:     Effort: Pulmonary effort is normal. No respiratory distress.     Breath sounds: Normal breath sounds. No stridor. No wheezing, rhonchi or rales.  Chest:     Chest wall: No tenderness.  Abdominal:     General: Bowel sounds are normal. There is no distension.     Palpations: Abdomen is soft. There is no hepatomegaly, splenomegaly or mass.     Tenderness: There is no abdominal tenderness. There is no right CVA tenderness, left CVA tenderness, guarding or rebound.     Hernia: No hernia is present.  Musculoskeletal:        General: No swelling, tenderness, deformity or signs of injury. Normal range of motion.     Cervical back: Normal range of motion and neck supple. No rigidity or tenderness.     Right lower leg: No edema.     Left lower leg: No edema.     Comments: Large deep scar in the right posterior shoulder that is well healed.   Lymphadenopathy:     Cervical: No cervical adenopathy.     Upper Body:     Right upper body: No supraclavicular or axillary adenopathy.     Left upper body: No supraclavicular or axillary adenopathy.     Lower Body: No right inguinal adenopathy. No left inguinal adenopathy.  Skin:    General: Skin is warm and dry.     Coloration: Skin is not jaundiced.     Findings: No bruising, lesion or rash.  Neurological:     General: No focal deficit present.     Mental Status: He is alert and oriented to person, place, and time. Mental status is at baseline.     Cranial Nerves: Facial asymmetry present. No cranial nerve deficit.     Sensory: No sensory deficit.     Motor: No weakness.     Coordination: Coordination normal.     Gait: Gait normal.     Deep Tendon Reflexes: Reflexes normal.     Comments: Left facial droop  Psychiatric:        Mood and Affect: Mood normal.        Behavior: Behavior normal.        Thought Content: Thought content normal.        Judgment: Judgment normal.    LABORATORY DATA:  I have reviewed the data  as listed    Component Value Date/Time   NA 138 03/04/2023 1104   NA 143 01/05/2023 0000   K 3.9 03/04/2023 1104   CL 101 03/04/2023 1104   CO2 29 03/04/2023 1104   GLUCOSE 93 03/04/2023 1104   BUN 13 03/04/2023 1104   BUN 9 01/05/2023 0000   CREATININE 0.85 03/04/2023 1104   CALCIUM 9.3 03/04/2023 1104  PROT 7.1 03/04/2023 1104   PROT 6.2 (A) 05/08/2022 0000   ALBUMIN 3.9 03/04/2023 1104   AST 20 03/04/2023 1104   ALT 22 03/04/2023 1104   ALKPHOS 74 03/04/2023 1104   BILITOT 0.5 03/04/2023 1104   GFRNONAA >60 03/04/2023 1104   No results found for: "SPEP", "UPEP"  Lab Results  Component Value Date   WBC 6.5 03/04/2023   NEUTROABS 3.71 03/04/2023   HGB 12.4 (A) 03/04/2023   HCT 37 (A) 03/04/2023   MCV 98.0 02/04/2023   PLT 374 03/04/2023     Chemistry      Component Value Date/Time   NA 138 03/04/2023 1104   NA 143 01/05/2023 0000   K 3.9 03/04/2023 1104   CL 101 03/04/2023 1104   CO2 29 03/04/2023 1104   BUN 13 03/04/2023 1104   BUN 9 01/05/2023 0000   CREATININE 0.85 03/04/2023 1104   GLU 95 01/05/2023 0000      Component Value Date/Time   CALCIUM 9.3 03/04/2023 1104   ALKPHOS 74 03/04/2023 1104   AST 20 03/04/2023 1104   ALT 22 03/04/2023 1104   BILITOT 0.5 03/04/2023 1104     Component Ref Range & Units 12/11/2022 1 mo ago 2 mo ago 3 mo ago 4 mo ago 5 mo ago 6 mo ago  T4, Total 4.5 - 12.0 ug/dL 8.1 7.7 CM 7.4 CM 8.3 CM 7.4 CM 7.0 CM 7.2 CM   Component Ref Range & Units 12/11/2022   1 mo ago 2 mo ago 3 mo ago 4 mo ago 5 mo ago 6 mo ago  TSH 0.350 - 4.500 uIU/mL 1.371 0.804 CM 0.790 CM 0.757 CM 0.761 CM 1.550 CM 1.581 CM    RADIOGRAPHIC STUDIES: I have personally reviewed the radiological images as listed and agreed with the findings in the report. MR BRAIN W WO CONTRAST  Result Date: 03/06/2023 CLINICAL DATA:  CNS neoplasm, assess treatment response. Follow-up metastatic melanoma EXAM: MRI HEAD WITHOUT AND WITH CONTRAST TECHNIQUE: Multiplanar,  multiecho pulse sequences of the brain and surrounding structures were obtained without and with intravenous contrast. CONTRAST:  7.5 cc of vueway intravenous COMPARISON:  11/19/2022 FINDINGS: Brain: On axial thin slice images there is a new punctate area of enhancement in the superior right frontal lobe on 13:118. Regression of left brachium pontis/pontine lesion size and irregularity of enhancement. Vasogenic edema around this lesion is also regressed. An anterior and paramedian right frontal lobe metastasis is size stable at 12 mm on 13:78, although vasogenic edema has increased to a mild degree. Stable scar-like wispy enhancement along the surface of the postoperative left temporal lobe without change in edema. No acute infarct, hydrocephalus, or collection. Vascular: Major flow voids and vascular enhancements are preserved Skull and upper cervical spine: Unremarkable left craniotomy site. Sinuses/Orbits: Unremarkable IMPRESSION: 1. New punctate superior right frontal lobe enhancement compatible with interval metastasis. 2. Interval regression of enhancement and edema at the left brachium pontis and pons. 3. Mild increased vasogenic edema at a pre-existing right frontal metastasis with underlying lesion enhancement remaining stable. Electronically Signed   By: Tiburcio Pea M.D.   On: 03/06/2023 07:05    EXAM:  01/16/2023 MRI OF THE LUMBAR SPINE WITHOUT AND WITH CONTRAST IMPRESSION A T L4-L5 there is a broad shallow left foraminal disc protrusion with a small anular fissure. No right foraminal stenosis. Mild left foraminal stenosis  No acute osseous injury of the lumbar spine.   EXAM - 01/05/2023 CT CHEST ABDOMEN AND PELVIS  WITH CONTRAST IMPRESSION Subcarinal and bilateral hilar lymphadenopathy remains resolved. Unchanged small pulmonary nodules of the left lung. No new nodules. Unchanged L1 corpectomy with lateral bridging fusion of T12 through L2. No evidence of new metastatic disease in the  chest, abdomen, or pelvis. Coronary artery disease.   EXAM: 11/19/2022 MRI HEAD WITHOUT AND WITH CONTRAST IMPRESSION: 1. No progression of metastatic disease. 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 Two other enhancing treatment sites remain stable.   2. No new metastatic disease or acute intracranial abnormality is identified.  EXAM: 10/23/2022 NM PET METABOLIC BRAIN  IMPRESSION: No increased metabolic activity associated with the LEFT brainstem lesion identified on coregistered MRI. 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    EXAM: 09/25/2022 MRI HEAD WITHOUT AND WITH CONTRAST IMPRESSION: 1. Increased size of a hemorrhagic left pons/middle cerebellar peduncle lesion with increased surrounding edema, described above. 2. Similar versus slightly decreased size of a 13 x 10 mm enhancing lesion in the medial right frontal lobe. 3. Mildly diminished intrinsic T1 hyperintensity of the left temporal resection site with linear enhancement along the lateral margin, probably postsurgical. Recommend continued attention on follow-up.        I,Oluwatobi Asade,acting as a scribe for Dellia Beckwith, MD.,have documented all relevant documentation on the behalf of Dellia Beckwith, MD,as directed by  Dellia Beckwith, MD while in the presence of Dellia Beckwith, MD.

## 2023-03-26 ENCOUNTER — Encounter: Payer: Self-pay | Admitting: Oncology

## 2023-03-26 DIAGNOSIS — M545 Low back pain, unspecified: Secondary | ICD-10-CM | POA: Diagnosis not present

## 2023-03-26 DIAGNOSIS — D1809 Hemangioma of other sites: Secondary | ICD-10-CM | POA: Diagnosis not present

## 2023-03-26 DIAGNOSIS — C439 Malignant melanoma of skin, unspecified: Secondary | ICD-10-CM | POA: Diagnosis not present

## 2023-03-26 DIAGNOSIS — G629 Polyneuropathy, unspecified: Secondary | ICD-10-CM | POA: Diagnosis not present

## 2023-03-27 ENCOUNTER — Other Ambulatory Visit: Payer: Self-pay | Admitting: Oncology

## 2023-03-27 ENCOUNTER — Other Ambulatory Visit: Payer: Self-pay | Admitting: Radiation Therapy

## 2023-03-30 ENCOUNTER — Inpatient Hospital Stay: Payer: Medicare Other | Attending: Internal Medicine

## 2023-04-02 ENCOUNTER — Inpatient Hospital Stay (INDEPENDENT_AMBULATORY_CARE_PROVIDER_SITE_OTHER): Payer: Medicare Other | Admitting: Oncology

## 2023-04-02 ENCOUNTER — Inpatient Hospital Stay: Payer: Medicare Other

## 2023-04-02 VITALS — BP 140/93 | HR 70 | Temp 97.4°F | Resp 16 | Ht 69.0 in | Wt 173.0 lb

## 2023-04-02 DIAGNOSIS — C7800 Secondary malignant neoplasm of unspecified lung: Secondary | ICD-10-CM

## 2023-04-02 DIAGNOSIS — C787 Secondary malignant neoplasm of liver and intrahepatic bile duct: Secondary | ICD-10-CM

## 2023-04-02 DIAGNOSIS — C7931 Secondary malignant neoplasm of brain: Secondary | ICD-10-CM

## 2023-04-02 DIAGNOSIS — Z09 Encounter for follow-up examination after completed treatment for conditions other than malignant neoplasm: Secondary | ICD-10-CM | POA: Diagnosis not present

## 2023-04-02 DIAGNOSIS — C439 Malignant melanoma of skin, unspecified: Secondary | ICD-10-CM | POA: Diagnosis not present

## 2023-04-02 DIAGNOSIS — C7801 Secondary malignant neoplasm of right lung: Secondary | ICD-10-CM | POA: Diagnosis not present

## 2023-04-02 DIAGNOSIS — C778 Secondary and unspecified malignant neoplasm of lymph nodes of multiple regions: Secondary | ICD-10-CM | POA: Diagnosis not present

## 2023-04-02 DIAGNOSIS — C4361 Malignant melanoma of right upper limb, including shoulder: Secondary | ICD-10-CM | POA: Diagnosis not present

## 2023-04-02 DIAGNOSIS — Z5112 Encounter for antineoplastic immunotherapy: Secondary | ICD-10-CM | POA: Diagnosis not present

## 2023-04-02 LAB — CMP (CANCER CENTER ONLY)
ALT: 21 U/L (ref 0–44)
AST: 24 U/L (ref 15–41)
Albumin: 4 g/dL (ref 3.5–5.0)
Alkaline Phosphatase: 70 U/L (ref 38–126)
Anion gap: 9 (ref 5–15)
BUN: 18 mg/dL (ref 6–20)
CO2: 26 mmol/L (ref 22–32)
Calcium: 9.2 mg/dL (ref 8.9–10.3)
Chloride: 104 mmol/L (ref 98–111)
Creatinine: 0.98 mg/dL (ref 0.61–1.24)
GFR, Estimated: >60 mL/min (ref >60)
Glucose, Bld: 80 mg/dL (ref 70–99)
Potassium: 3.9 mmol/L (ref 3.5–5.1)
Sodium: 139 mmol/L (ref 135–145)
Total Bilirubin: 0.6 mg/dL (ref 0.3–1.2)
Total Protein: 7.4 g/dL (ref 6.5–8.1)

## 2023-04-02 LAB — CBC WITH DIFFERENTIAL (CANCER CENTER ONLY)
Abs Immature Granulocytes: 0.02 10*3/uL (ref 0.00–0.07)
Basophils Absolute: 0.1 10*3/uL (ref 0.0–0.1)
Basophils Relative: 1 %
Eosinophils Absolute: 0.2 10*3/uL (ref 0.0–0.5)
Eosinophils Relative: 4 %
HCT: 40.3 % (ref 39.0–52.0)
Hemoglobin: 12.9 g/dL — ABNORMAL LOW (ref 13.0–17.0)
Immature Granulocytes: 0 %
Lymphocytes Relative: 30 %
Lymphs Abs: 1.8 10*3/uL (ref 0.7–4.0)
MCH: 30.8 pg (ref 26.0–34.0)
MCHC: 32 g/dL (ref 30.0–36.0)
MCV: 96.2 fL (ref 80.0–100.0)
Monocytes Absolute: 0.6 10*3/uL (ref 0.1–1.0)
Monocytes Relative: 10 %
Neutro Abs: 3.2 10*3/uL (ref 1.7–7.7)
Neutrophils Relative %: 55 %
Platelet Count: 351 10*3/uL (ref 150–400)
RBC: 4.19 MIL/uL — ABNORMAL LOW (ref 4.22–5.81)
RDW: 12.9 % (ref 11.5–15.5)
WBC Count: 5.8 10*3/uL (ref 4.0–10.5)
nRBC: 0 % (ref 0.0–0.2)

## 2023-04-02 LAB — TSH: TSH: 1.564 u[IU]/mL (ref 0.350–4.500)

## 2023-04-02 NOTE — Progress Notes (Signed)
[facility] Cancer Follow up Visit:  Patient Care Team: [name], NP as PCP - General (Nurse Practitioner) Dellia Beckwith, MD as Consulting Physician (Oncology) Lance Bosch, MD as Consulting Physician (Radiation Oncology)  CHIEF COMPLAINTS/PURPOSE OF CONSULTATION:  Oncology History  Melanoma of skin (HCC)  12/01/2019 Initial Diagnosis   Melanoma of skin (HCC)   12/05/2019 Cancer Staging   Staging form: Melanoma of the Skin, AJCC 8th Edition - Clinical stage from 12/05/2019: Stage IIA (cT3a, cN0, cM0) - Signed by Dellia Beckwith, MD on 05/30/2020   02/18/2021 Genetic Testing   No pathogenic variants detected in [facility] Multi-Cancer +RNA Panel.  Variants of uncertain significance detected in APC (c.681C>G (p.Asp227Glu)), CDKN1C (c.610C>G (p.Pro204Ala)), and FH (c.1151C>T (p.Ala384Val)).  The report date is February 18, 2021.    The Multi-Cancer + RNA Panel offered by [facility] includes sequencing and/or deletion/duplication analysis of the following 84 genes:  AIP*, ALK, APC*, ATM*, AXIN2*, BAP1*, BARD1*, BLM*, BMPR1A*, BRCA1*, BRCA2*, BRIP1*, CASR, CDC73*, CDH1*, CDK4, CDKN1B*, CDKN1C*, CDKN2A, CEBPA, CHEK2*, CTNNA1*, DICER1*, DIS3L2*, EGFR, EPCAM, FH*, FLCN*, GATA2*, GPC3, GREM1, HOXB13, HRAS, KIT, MAX*, MEN1*, MET, MITF, MLH1*, MSH2*, MSH3*, MSH6*, MUTYH*, NBN*, NF1*, NF2*, NTHL1*, PALB2*, PDGFRA, PHOX2B, PMS2*, POLD1*, POLE*, POT1*, PRKAR1A*, PTCH1*, PTEN*, RAD50*, RAD51C*, RAD51D*, RB1*, RECQL4, RET, RUNX1*, SDHA*, SDHAF2*, SDHB*, SDHC*, SDHD*, SMAD4*, SMARCA4*, SMARCB1*, SMARCE1*, STK11*, SUFU*, TERC, TERT, TMEM127*, Tp53*, TSC1*, TSC2*, VHL*, WRN*, and WT1.  RNA analysis is performed for * genes.   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 Small nonocclusive segmental level pulmonary embolism in the  inferior LEFT upper lobe.  2. Marked bronchial wall thickening with areas of patchy basilar  opacities and new ground-glass opacities in the upper lobes.  Constellation of findings may be infectious bronchiolitis/bronchitis  though the possibility of drug related changes in the setting of  immunotherapy are also considered. Furthermore, given basilar  predominance and some material in basilar bronchial structures would  correlate with any risk factors for or history of aspiration.  3. Response to therapy of bilateral metastatic lesions. Some lymph  nodes in the chest with interval increase in size are equivocal at  this time, potentially reactive, but warrant attention on subsequent  imaging.    07/22/2022 Imaging   CT chest, abdomen and pelvis:  IMPRESSION:  1. Interval positive response to therapy. Resolved subcarinal and  bilateral hilar lymphadenopathy. Stable to decreased left pulmonary  nodules. No new or progressive metastatic disease  in the chest,  abdomen or pelvis.  2. One vessel coronary atherosclerosis.  3. Aortic Atherosclerosis (ICD10-I70.0).    Malignant neoplasm metastatic to liver (HCC)  12/19/2021  Initial Diagnosis   Malignant neoplasm metastatic to liver (HCC)   01/30/2022 - 02/20/2022 Chemotherapy   Patient is on Treatment Plan : MELANOMA Nivolumab + Ipilimumab (1/3) q21d / Nivolumab q28d     01/30/2022 -  Chemotherapy   Patient is on Treatment Plan : MELANOMA Nivolumab (480) q28d     07/22/2022 Imaging   CT chest, abdomen and pelvis:  IMPRESSION:  1. Interval positive response to therapy. Resolved subcarinal and  bilateral hilar lymphadenopathy. Stable to decreased left pulmonary  nodules. No new or progressive metastatic disease in the chest,  abdomen or pelvis.  2. One vessel coronary atherosclerosis.  3. Aortic Atherosclerosis (ICD10-I70.0).      HISTORY OF PRESENTING ILLNESS: Xavier Jones 52 y.o. 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  There was still a question as to whether this represented a metastatic lesion, rather than a primary, based on the presence of 2 nodules and no epidermal involvement.  MRI head and CT chest, abdomen and pelvis in June were negative for metastatic disease.   No adjuvant therapy was recommended.  PET scan in September was negative for metastatic disease at that time.  By June 2023, he was found to have widespread metastatic disease including to the brain.  CT scans in January, 2024, showed good response in the lung and skin. Current scans from July, 2024 continue to look good.    January 05 2023:  CT CAP Subcarinal and bilateral hilar lymphadenopathy remains resolved. Unchanged small pulmonary nodules of the left lung. No new nodules.  Unchanged L1 corpectomy with left lateral bridging fusion of T12  through L2.  No evidence of new metastatic disease in the chest, abdomen, or pelvis. Coronary artery disease.   February 19 2023:  MRI brain New punctate superior right frontal lobe  enhancement compatible with interval metastasis.  Interval regression of enhancement and edema at the left brachium pontis and pons.  Mild increased vasogenic edema at a pre-existing right frontal metastasis with underlying lesion enhancement remaining stable.   January 16 2023:  MRI lumbar spine  At L4-5 there is a broad shallow left foraminal disc protrusion with a small annular fissure. No right foraminal stenosis. Mild left  foraminal stenosis. No acute osseous injury of the lumbar spine.   April 02 2023- Scheduled follow up for management of malignant melanoma.  Has lost 4 lbs.  Stopped steroids in July and had gained weight while on them.  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  HENT:   Negative for mouth sores, sore throat, tinnitus and trouble swallowing.   Eyes:  Negative for eye problems and icterus.       Vision blurry   Respiratory:  Negative for chest tightness, cough, hemoptysis, shortness of breath and wheezing.        PND:  none Orthopnea:  none DOE:    Cardiovascular:  Negative for chest pain and palpitations.       Some mild LE edema  Gastrointestinal:  Negative for blood in stool, constipation, diarrhea  and vomiting.       Some nausea today  Endocrine: Negative for hot flashes.       Cold intolerance:  none Heat intolerance:  none  Genitourinary:  Negative for bladder incontinence, difficulty urinating, dysuria, frequency, hematuria and nocturia.   Musculoskeletal:  Negative for arthralgias, back pain, myalgias, neck pain and neck stiffness.  Skin:  Negative for itching, rash and wound.  Neurological:  Negative for dizziness, extremity weakness, headaches, light-headedness, seizures and speech difficulty.       Neuropathy of right leg  Hematological:  Negative for adenopathy. Does not bruise/bleed easily.   Psychiatric/Behavioral:  Negative for suicidal ideas. The patient is not nervous/anxious.    MEDICAL HISTORY: Past Medical History:  Diagnosis Date   Acute pulmonary embolism without acute cor pulmonale (HCC) 05/01/2022   Anemia 05/20/2022   Depression    mild   Family history of breast cancer 01/22/2021   Family history of prostate cancer 01/22/2021   Genital herpes    History of back injury 2003   Pruritic rash 02/18/2022   Skin cancer    melanoma    SURGICAL HISTORY: Past Surgical History:  Procedure Laterality Date   APPLICATION OF CRANIAL NAVIGATION N/A 01/03/2022   Procedure: APPLICATION OF CRANIAL NAVIGATION;  Surgeon: Lisbeth Renshaw, MD;  Location: [facility] OR;  Service: Neurosurgery;  Laterality: N/A;   CRANIOTOMY Left 01/03/2022   Procedure: Stereotactic Left temporal craniotomy for resection of tumor;  Surgeon: Lisbeth Renshaw, MD;  Location: [facility] OR;  Service: Neurosurgery;  Laterality: Left;   MELANOMA EXCISION WITH SENTINEL LYMPH NODE BIOPSY Right 2021   SPINAL FUSION W/ LUQUE UNIT ROD  2003    SOCIAL HISTORY: Social History   Socioeconomic History   Marital status: Divorced    Spouse name: Not on file   Number of children: 0   Years of education: Not on file   Highest education level: Not on file  Occupational History   Not on file  Tobacco Use   Smoking status: Former    Current packs/day: 0.00    Types: Cigarettes    Quit date: 1998    Years since quitting: 26.7   Smokeless tobacco: Never  Vaping Use   Vaping status: Never Used  Substance and Sexual Activity   Alcohol use: Yes    Alcohol/week: 14.0 standard drinks of alcohol    Types: 14 Cans of beer per week    Comment: 2-3 beers per evening   Drug use: Not Currently   Sexual activity: Not on file  Other Topics Concern   Not on file  Social History Narrative   Not on file   Social Determinants of Health   Financial Resource Strain: Not on file  Food Insecurity: Not on file   Transportation Needs: Not on file  Physical Activity: Not on file  Stress: Not on file  Social Connections: Not on file  Intimate Partner Violence: Not on file    FAMILY HISTORY Family History  Problem Relation Age of Onset   Prostate cancer Father        dx 57s   Breast cancer Maternal Aunt        dx after 36; bilateral mastectomy   Stomach cancer Maternal Grandfather        dx 56s   Breast cancer Cousin 99       maternal male cousin; mets    ALLERGIES:  is allergic to sulfa antibiotics and testosterone.  MEDICATIONS:  Current Outpatient Medications  Medication Sig  Dispense Refill   acebutolol (SECTRAL) 200 MG capsule Take 1 capsule (200 mg total) by mouth daily. 90 capsule 3   acyclovir (ZOVIRAX) 400 MG tablet Take 400 mg by mouth 3 (three) times daily.     amitriptyline (ELAVIL) 25 MG tablet Take 50 mg by mouth at bedtime.     doxycycline (VIBRAMYCIN) 50 MG capsule      ergocalciferol (VITAMIN D2) 1.25 MG (50000 UT) capsule Take 50,000 Units by mouth every Wednesday.     gabapentin (NEURONTIN) 300 MG capsule Take 2 capsules (600 mg total) by mouth 3 (three) times daily. 180 capsule 5   HYDROcodone-acetaminophen (NORCO/VICODIN) 5-325 MG tablet Take 1 tablet by mouth every 4 (four) hours as needed for moderate pain. 120 tablet 0   hydrOXYzine (ATARAX) 25 MG tablet TAKE ONE TABLET BY MOUTH 3 TIMES A DAY AS NEEDED FOR ITCHING 60 tablet 3   lidocaine-prilocaine (EMLA) cream Apply 1 Application topically as needed. 30 g 0   pentoxifylline (TRENTAL) 400 MG CR tablet Take 1 tablet (400 mg total) by mouth in the morning and at bedtime. 60 tablet 3   vitamin E 180 MG (400 UNITS) capsule Take 1 capsule (400 Units total) by mouth daily. 30 capsule 2   XARELTO 20 MG TABS tablet TAKE ONE TABLET BY MOUTH EVERY DAY WITH SUPPER. START AFTER 3 WEEKS OF 15MG 2 TIMES A DAY 30 tablet 3   No current facility-administered medications for this visit.    PHYSICAL EXAMINATION:  ECOG  PERFORMANCE STATUS: 1 - Symptomatic but completely ambulatory   Vitals:   04/02/23 1038  BP: (!) 140/93  Pulse: 70  Resp: 16  Temp: (!) 97.4 F (36.3 C)  SpO2: 97%    Filed Weights   04/02/23 1038  Weight: 173 lb (78.5 kg)     Physical Exam Vitals and nursing note reviewed.  Constitutional:      Appearance: Normal appearance. He is not diaphoretic.  HENT:     Head: Normocephalic and atraumatic.     Right Ear: External ear normal.     Left Ear: External ear normal.     Nose: Nose normal.  Eyes:     Conjunctiva/sclera: Conjunctivae normal.     Pupils: Pupils are equal, round, and reactive to light.  Cardiovascular:     Rate and Rhythm: Normal rate and regular rhythm.     Heart sounds:     No friction rub. No gallop.  Musculoskeletal:     Cervical back: Normal range of motion and neck supple. No rigidity or tenderness.  Lymphadenopathy:     Head:     Right side of head: No submental, submandibular, tonsillar, preauricular, posterior auricular or occipital adenopathy.     Left side of head: No submental, submandibular, tonsillar, preauricular, posterior auricular or occipital adenopathy.     Cervical: No cervical adenopathy.     Right cervical: No superficial, deep or posterior cervical adenopathy.    Left cervical: No superficial, deep or posterior cervical adenopathy.     Upper Body:     Right upper body: No supraclavicular or axillary adenopathy.     Left upper body: No supraclavicular or axillary adenopathy.     Lower Body: No right inguinal adenopathy. No left inguinal adenopathy.  Skin:    Coloration: Skin is not jaundiced.  Neurological:     General: No focal deficit present.     Mental Status: He is alert and oriented to person, place, and time. Mental status is at baseline.  Comments: Left facial droop  Psychiatric:        Mood and Affect: Mood normal.        Behavior: Behavior normal.        Thought Content: Thought content normal.        Judgment:  Judgment normal.    LABORATORY DATA: I have personally reviewed the data as listed:  Abstract on 03/04/2023  Component Date Value Ref Range Status   Hemoglobin 03/04/2023 12.4 (A)  13.5 - 17.5 Final   HCT 03/04/2023 37 (A)  41 - 53 Final   Neutrophils Absolute 03/04/2023 3.71   Final   Platelets 03/04/2023 374  150 - 400 K/uL Final   WBC 03/04/2023 6.5   Final   RBC 03/04/2023 3.94  3.87 - 5.11 Final  Appointment on 03/04/2023  Component Date Value Ref Range Status   Sodium 03/04/2023 138  135 - 145 mmol/L Final   Potassium 03/04/2023 3.9  3.5 - 5.1 mmol/L Final   Chloride 03/04/2023 101  98 - 111 mmol/L Final   CO2 03/04/2023 29  22 - 32 mmol/L Final   Glucose, Bld 03/04/2023 93  70 - 99 mg/dL Final   Glucose reference range applies only to samples taken after fasting for at least 8 hours.   BUN 03/04/2023 13  6 - 20 mg/dL Final   Creatinine 47/82/9562 0.85  0.61 - 1.24 mg/dL Final   Calcium 13/02/6577 9.3  8.9 - 10.3 mg/dL Final   Total Protein 46/96/2952 7.1  6.5 - 8.1 g/dL Final   Albumin 84/13/2440 3.9  3.5 - 5.0 g/dL Final   AST 05/03/2535 20  15 - 41 U/L Final   ALT 03/04/2023 22  0 - 44 U/L Final   Alkaline Phosphatase 03/04/2023 74  38 - 126 U/L Final   Total Bilirubin 03/04/2023 0.5  0.3 - 1.2 mg/dL Final   GFR, Estimated 03/04/2023 >60  >60 mL/min Final   Comment: (NOTE) Calculated using the CKD-EPI Creatinine Equation (2021)    Anion gap 03/04/2023 8  5 - 15 Final   Performed at [facility], [address] [place], [place] [address]   T4, Total 03/04/2023 6.7  4.5 - 12.0 ug/dL Final   Comment: (NOTE) Performed At: [facility] [address] [place], [place] [id number] Jolene Schimke MD OV:[id number]    TSH 03/04/2023 2.825  0.350 - 4.500 uIU/mL Final   Comment: Performed by a 3rd Generation assay with a functional sensitivity of <=0.01 uIU/mL. Performed at [facility], [address], [place], [place]  [address]    CBC with Differential 03/04/2023 Sent to [facility] [place] [facility] for testing. See scanned report.   Final   Performed at [facility], [address] [place], [place] [address]    RADIOGRAPHIC STUDIES: I have personally reviewed the radiological images as listed and agree with the findings in the report  No results found.  ASSESSMENT/PLAN  52 y.o. male with malignant melanoma  Stage IV malignant melanoma  May 2021- Diagnosed with Stage IIB malignant melanoma of right posterior shoulder.  Breslow's depth 3.3 mm, Clark level IV, treated with wide local excision MRI head and CT chest, abdomen and pelvis in June were negative for metastatic disease.   PET/CT was likewise negative for metastatic disease No adjuvant therapy was recommended    June 2023- Found to have widespread metastatic disease    January 30 2022:  Cycle 1 Nivolumab/ Ipilumumab  July 02 2023:  Maintenance Nivolumab  begun   January 2024- CT showed good response  January 05 2023:  CT CAP Subcarinal and bilateral hilar lymphadenopathy remains resolved. Unchanged small pulmonary nodules of the left lung. No new nodules.  Unchanged L1 corpectomy with left lateral bridging fusion of T12  through L2.  No evidence of new metastatic disease in the chest, abdomen, or pelvis. Coronary artery disease.    February 19 2023:  MRI brain New punctate superior right frontal lobe enhancement compatible with interval metastasis.  Interval regression of enhancement and edema at the left brachium pontis and pons.  Mild increased vasogenic edema at a pre-existing right frontal metastasis with underlying lesion enhancement remaining stable.    April 07 2023: Cycle 15 Nivolumab    Cancer Staging  Melanoma of skin [facility]) Staging form: Melanoma of the Skin, AJCC 8th Edition - Clinical stage from 12/05/2019: Stage IIA (cT3a, cN0, cM0) - Signed by Dellia Beckwith, MD on 05/30/2020 Histopathologic type: Malignant melanoma, NOS  (except juvenile melanoma [id number]) Stage prefix: Initial diagnosis Laterality: Right Lymph-vascular invasion (LVI): LVI not present (absent)/not identified Diagnostic confirmation: Positive histology Specimen type: Excision Staged by: Managing physician Mitotic count: 4 Mitotic unit: mm2 Clark's level: Level IV Tumor-infiltrating lymphocytes: Unknown Breslow depth (mm): 3.3 Ulceration of the epidermis: No Microsatellites: No Primary tumor regression: Present Lymph node clinically or radiologically detected: No Microscopic confirmation of metastasis: No Sentinel lymph node biopsy performed: Yes Stage used in treatment planning: Yes National guidelines used in treatment planning: Yes Type of national guideline used in treatment planning: NCCN    No problem-specific Assessment & Plan notes found for this encounter.    No orders of the defined types were placed in this encounter.   40  minutes was spent in patient care.  This included time spent preparing to see the patient (e.g., review of tests), obtaining and/or reviewing separately obtained history, counseling and educating the patient/family/caregiver, ordering medications, tests, or procedures; documenting clinical information in the electronic or other health record, independently interpreting results and communicating results to the patient/family/caregiver as well as coordination of care.       All questions were answered. The patient knows to call the clinic with any problems, questions or concerns.  This note was electronically signed.    Loni Muse, MD  04/02/2023 10:55 AM

## 2023-04-03 ENCOUNTER — Other Ambulatory Visit: Payer: Self-pay | Admitting: Oncology

## 2023-04-03 DIAGNOSIS — G629 Polyneuropathy, unspecified: Secondary | ICD-10-CM

## 2023-04-03 DIAGNOSIS — M5126 Other intervertebral disc displacement, lumbar region: Secondary | ICD-10-CM

## 2023-04-03 DIAGNOSIS — M79604 Pain in right leg: Secondary | ICD-10-CM

## 2023-04-03 LAB — T4: T4, Total: 7 ug/dL (ref 4.5–12.0)

## 2023-04-06 MED FILL — Nivolumab IV Soln 240 MG/24ML: INTRAVENOUS | Qty: 48 | Status: AC

## 2023-04-07 ENCOUNTER — Inpatient Hospital Stay: Payer: Medicare Other | Attending: Hematology and Oncology

## 2023-04-07 DIAGNOSIS — C787 Secondary malignant neoplasm of liver and intrahepatic bile duct: Secondary | ICD-10-CM | POA: Insufficient documentation

## 2023-04-07 DIAGNOSIS — C7931 Secondary malignant neoplasm of brain: Secondary | ICD-10-CM | POA: Insufficient documentation

## 2023-04-07 DIAGNOSIS — Z23 Encounter for immunization: Secondary | ICD-10-CM | POA: Diagnosis not present

## 2023-04-07 DIAGNOSIS — Z79620 Long term (current) use of immunosuppressive biologic: Secondary | ICD-10-CM | POA: Diagnosis not present

## 2023-04-07 DIAGNOSIS — Z5112 Encounter for antineoplastic immunotherapy: Secondary | ICD-10-CM | POA: Diagnosis not present

## 2023-04-07 DIAGNOSIS — C7802 Secondary malignant neoplasm of left lung: Secondary | ICD-10-CM | POA: Diagnosis not present

## 2023-04-07 DIAGNOSIS — C4361 Malignant melanoma of right upper limb, including shoulder: Secondary | ICD-10-CM | POA: Insufficient documentation

## 2023-04-07 DIAGNOSIS — C7800 Secondary malignant neoplasm of unspecified lung: Secondary | ICD-10-CM

## 2023-04-07 DIAGNOSIS — C439 Malignant melanoma of skin, unspecified: Secondary | ICD-10-CM

## 2023-04-07 DIAGNOSIS — C7801 Secondary malignant neoplasm of right lung: Secondary | ICD-10-CM | POA: Insufficient documentation

## 2023-04-07 MED ORDER — INFLUENZA VIRUS VACC SPLIT PF (FLUZONE) 0.5 ML IM SUSY
0.5000 mL | PREFILLED_SYRINGE | Freq: Once | INTRAMUSCULAR | Status: AC
  Administered 2023-04-07: 0.5 mL via INTRAMUSCULAR
  Filled 2023-04-07: qty 0.5

## 2023-04-07 MED ORDER — SODIUM CHLORIDE 0.9 % IV SOLN
480.0000 mg | Freq: Once | INTRAVENOUS | Status: AC
  Administered 2023-04-07: 480 mg via INTRAVENOUS
  Filled 2023-04-07: qty 48

## 2023-04-07 MED ORDER — HEPARIN SOD (PORK) LOCK FLUSH 100 UNIT/ML IV SOLN
500.0000 [IU] | Freq: Once | INTRAVENOUS | Status: AC | PRN
  Administered 2023-04-07: 500 [IU]

## 2023-04-07 MED ORDER — SODIUM CHLORIDE 0.9 % IV SOLN: Freq: Once | INTRAVENOUS | Status: AC

## 2023-04-07 MED ORDER — SODIUM CHLORIDE 0.9% FLUSH
10.0000 mL | INTRAVENOUS | Status: DC | PRN
  Administered 2023-04-07: 10 mL

## 2023-04-07 NOTE — Patient Instructions (Addendum)
Nivolumab Injection What is this medication? NIVOLUMAB (nye VOL ue mab) treats some types of cancer. It works by helping your immune system slow or stop the spread of cancer cells. It is a monoclonal antibody. This medicine may be used for other purposes; ask your health care provider or pharmacist if you have questions. COMMON BRAND NAME(S): Opdivo What should I tell my care team before I take this medication? They need to know if you have any of these conditions: Allogeneic stem cell transplant (uses someone else's stem cells) Autoimmune diseases, such as Crohn disease, ulcerative colitis, lupus History of chest radiation Nervous system problems, such as Guillain-Barre syndrome or myasthenia gravis Organ transplant An unusual or allergic reaction to nivolumab, other medications, foods, dyes, or preservatives Pregnant or trying to get pregnant Breast-feeding How should I use this medication? This medication is infused into a vein. It is given in a hospital or clinic setting. A special MedGuide will be given to you before each treatment. Be sure to read this information carefully each time. Talk to your care team about the use of this medication in children. While it may be prescribed for children as young as 12 years for selected conditions, precautions do apply. Overdosage: If you think you have taken too much of this medicine contact a poison control center or emergency room at once. NOTE: This medicine is only for you. Do not share this medicine with others. What if I miss a dose? Keep appointments for follow-up doses. It is important not to miss your dose. Call your care team if you are unable to keep an appointment. What may interact with this medication? Interactions have not been studied. This list may not describe all possible interactions. Give your health care provider a list of all the medicines, herbs, non-prescription drugs, or dietary supplements you use. Also tell them if you  smoke, drink alcohol, or use illegal drugs. Some items may interact with your medicine. What should I watch for while using this medication? Your condition will be monitored carefully while you are receiving this medication. You may need blood work while taking this medication. This medication may cause serious skin reactions. They can happen weeks to months after starting the medication. Contact your care team right away if you notice fevers or flu-like symptoms with a rash. The rash may be red or purple and then turn into blisters or peeling of the skin. You may also notice a red rash with swelling of the face, lips, or lymph nodes in your neck or under your arms. Tell your care team right away if you have any change in your eyesight. Talk to your care team if you are pregnant or think you might be pregnant. A negative pregnancy test is required before starting this medication. A reliable form of contraception is recommended while taking this medication and for 5 months after the last dose. Talk to your care team about effective forms of contraception. Do not breast-feed while taking this medication and for 5 months after the last dose. What side effects may I notice from receiving this medication? Side effects that you should report to your care team as soon as possible: Allergic reactions--skin rash, itching, hives, swelling of the face, lips, tongue, or throat Dry cough, shortness of breath or trouble breathing Eye pain, redness, irritation, or discharge with blurry or decreased vision Heart muscle inflammation--unusual weakness or fatigue, shortness of breath, chest pain, fast or irregular heartbeat, dizziness, swelling of the ankles, feet, or hands Hormone  gland problems--headache, sensitivity to light, unusual weakness or fatigue, dizziness, fast or irregular heartbeat, increased sensitivity to cold or heat, excessive sweating, constipation, hair loss, increased thirst or amount of urine,  tremors or shaking, irritability Infusion reactions--chest pain, shortness of breath or trouble breathing, feeling faint or lightheaded Kidney injury (glomerulonephritis)--decrease in the amount of urine, red or dark brown urine, foamy or bubbly urine, swelling of the ankles, hands, or feet Liver injury--right upper belly pain, loss of appetite, nausea, light-colored stool, dark yellow or brown urine, yellowing skin or eyes, unusual weakness or fatigue Pain, tingling, or numbness in the hands or feet, muscle weakness, change in vision, confusion or trouble speaking, loss of balance or coordination, trouble walking, seizures Rash, fever, and swollen lymph nodes Redness, blistering, peeling, or loosening of the skin, including inside the mouth Sudden or severe stomach pain, bloody diarrhea, fever, nausea, vomiting Side effects that usually do not require medical attention (report these to your care team if they continue or are bothersome): Bone, joint, or muscle pain Diarrhea Fatigue Loss of appetite Nausea Skin rash This list may not describe all possible side effects. Call your doctor for medical advice about side effects. You may report side effects to FDA at 1-800-FDA-1088. Where should I keep my medication? This medication is given in a hospital or clinic. It will not be stored at home. NOTE: This sheet is a summary. It may not cover all possible information. If you have questions about this medicine, talk to your doctor, pharmacist, or health care provider.  2024 [employer]vier/Gold Standard (2021-10-21 00:00:00) Influenza Vaccine Injection What is this medication? INFLUENZA VACCINE (in floo EN zuh vak SEEN) reduces the risk of the influenza (flu). It does not treat influenza. It is still possible to get influenza after receiving this vaccine, but the symptoms may be less severe or not last as long. It works by helping your immune system learn how to fight off a future infection. This medicine  may be used for other purposes; ask your health care provider or pharmacist if you have questions. COMMON BRAND NAME(S): Afluria Quadrivalent, FLUAD Quadrivalent, Fluarix Quadrivalent, Flublok Quadrivalent, FLUCELVAX Quadrivalent, Flulaval Quadrivalent, Fluzone Quadrivalent What should I tell my care team before I take this medication? They need to know if you have any of these conditions: Bleeding disorder like hemophilia Fever or infection Guillain-Barre syndrome or other neurological problems Immune system problems Infection with the human immunodeficiency virus (HIV) or AIDS Low blood platelet counts Multiple sclerosis An unusual or allergic reaction to influenza virus vaccine, latex, other medications, foods, dyes, or preservatives. Different brands of vaccines contain different allergens. Some may contain latex or eggs. Talk to your care team about your allergies to make sure that you get the right vaccine. Pregnant or trying to get pregnant Breastfeeding How should I use this medication? This vaccine is injected into a muscle or under the skin. It is given by your care team. A copy of Vaccine Information Statements will be given before each vaccination. Be sure to read this sheet carefully each time. This sheet may change often. Talk to your care team to see which vaccines are right for you. Some vaccines should not be used in all age groups. Overdosage: If you think you have taken too much of this medicine contact a poison control center or emergency room at once. NOTE: This medicine is only for you. Do not share this medicine with others. What if I miss a dose? This does not apply. What may interact  with this medication? Certain medications that lower your immune system, such as etanercept, anakinra, infliximab, adalimumab Certain medications that prevent or treat blood clots, such as warfarin Chemotherapy or radiation therapy Phenytoin Steroid medications, such as prednisone or  cortisone Theophylline Vaccines This list may not describe all possible interactions. Give your health care provider a list of all the medicines, herbs, non-prescription drugs, or dietary supplements you use. Also tell them if you smoke, drink alcohol, or use illegal drugs. Some items may interact with your medicine. What should I watch for while using this medication? Report any side effects that do not go away with your care team. Call your care team if any unusual symptoms occur within 6 weeks of receiving this vaccine. You may still catch the flu, but the illness is not usually as bad. You cannot get the flu from the vaccine. The vaccine will not protect against colds or other illnesses that may cause fever. The vaccine is needed every year. What side effects may I notice from receiving this medication? Side effects that you should report to your care team as soon as possible: Allergic reactions--skin rash, itching, hives, swelling of the face, lips, tongue, or throat Side effects that usually do not require medical attention (report these to your care team if they continue or are bothersome): Chills Fatigue Headache Joint pain Loss of appetite Muscle pain Nausea Pain, redness, or irritation at injection site This list may not describe all possible side effects. Call your doctor for medical advice about side effects. You may report side effects to FDA at 1-800-FDA-1088. Where should I keep my medication? The vaccine is only given by your care team. It will not be stored at home. NOTE: This sheet is a summary. It may not cover all possible information. If you have questions about this medicine, talk to your doctor, pharmacist, or health care provider.  2024 [employer]/Gold Standard (2021-12-03 00:00:00)

## 2023-04-15 ENCOUNTER — Other Ambulatory Visit: Payer: Self-pay | Admitting: Oncology

## 2023-04-15 DIAGNOSIS — G5791 Unspecified mononeuropathy of right lower limb: Secondary | ICD-10-CM

## 2023-04-15 MED ORDER — GABAPENTIN 300 MG PO CAPS
900.0000 mg | ORAL_CAPSULE | Freq: Three times a day (TID) | ORAL | 5 refills | Status: DC
Start: 2023-04-15 — End: 2023-06-02

## 2023-04-16 ENCOUNTER — Telehealth: Payer: Self-pay

## 2023-04-16 NOTE — Telephone Encounter (Signed)
Patient notified of increase in Gabapentin dose.

## 2023-04-16 NOTE — Telephone Encounter (Signed)
-----   Message from Dellia Beckwith sent at 04/15/2023  7:28 PM EDT ----- Regarding: RE: Gabapentin refill Okay, I will increase to 3 pills tid ----- Message ----- From: Jeannette Corpus, LPN Sent: 16/07/958   5:05 PM EDT To: Dellia Beckwith, MD Subject: Gabapentin refill                              Patient called wanting a refill on Gabapentin. But also was asking if it could be increased cause it is helping not as much as it was.

## 2023-04-17 DIAGNOSIS — H04122 Dry eye syndrome of left lacrimal gland: Secondary | ICD-10-CM | POA: Diagnosis not present

## 2023-04-17 DIAGNOSIS — H26492 Other secondary cataract, left eye: Secondary | ICD-10-CM | POA: Diagnosis not present

## 2023-04-21 ENCOUNTER — Encounter: Payer: Self-pay | Admitting: Oncology

## 2023-04-21 DIAGNOSIS — Z09 Encounter for follow-up examination after completed treatment for conditions other than malignant neoplasm: Secondary | ICD-10-CM

## 2023-04-21 HISTORY — DX: Encounter for follow-up examination after completed treatment for conditions other than malignant neoplasm: Z09

## 2023-04-22 ENCOUNTER — Encounter: Payer: Self-pay | Admitting: Oncology

## 2023-04-24 DIAGNOSIS — J342 Deviated nasal septum: Secondary | ICD-10-CM | POA: Diagnosis not present

## 2023-04-24 DIAGNOSIS — R0981 Nasal congestion: Secondary | ICD-10-CM | POA: Diagnosis not present

## 2023-04-28 NOTE — Progress Notes (Signed)
Patient Care Team: [name], NP as PCP - General (Nurse Practitioner) Dellia Beckwith, MD as Consulting Physician (Oncology) Lance Bosch, MD as Consulting Physician (Radiation Oncology)  Clinic Day: 04/30/23  Referring physician: [name], NP  ASSESSMENT & PLAN:  Assessment & Plan: Melanoma of skin (HCC) Stage IIB malignant melanoma of the right posterior shoulder, Breslow's depth 3.3 mm, Clark level IV, treated with wide local excision, diagnosed in May 2021.  There was still a question as to whether this represented a metastatic lesion, rather than a primary, based on the presence of 2 nodules and no epidermal involvement.  MRI head and CT chest, abdomen and pelvis in June were negative for metastatic disease.   No adjuvant therapy was recommended.  PET scan in September was negative for metastatic disease at that time.  By June 2023, he was found to have widespread metastatic disease including to the brain.  CT scans in January, 2024, showed good response in the lung and skin. Current scans from July, 2024 continue to look good.   Metastasis to subcutaneous tissue  He presented with a new area of concern to the left mid back in June of 2023.  A superficial nodule was noted approximately 1 cm to the right of his upper thoracic spine,and it had increased in size. He was sent for biopsy and that was performed on June 13 and confirmed metastatic malignant melanoma.   He has several hypermetabolic subcutaneous nodules seen on PET scan which are responding.  Metastasis to liver He had a small hypermetabolic liver lesion which was new in June of 2023, and less than 1 cm, but suspicious for metastasis. This is no longer visible.  Metastasis to lymph nodes He had multiple hypermetabolic nodes of the retroperitoneal and right pelvic areas, as well as right external iliac and a few additional nodes. These have all resolved.  Pulmonary metastases He had bilateral  hypermetabolic pulmonary nodules which increased in size by the time treatment was started.  The largest nodule is up to 2.3 cm in diameter. CT in January, 2024 reveals good response. His latest scan shows 2 tiny 4 mm nodules which are persistent and unchanged. There are no other suspicious or new nodules.  Metastases to brain This was suspicious on the PET scan in the left frontoparietal lobe and so he had an MRI of the brain.  This confirmed a 2.6 cm heterogeneous lesion of the left superior temporal gyrus with mild enhancement and extensive surrounding edema.  He also has an 8 mm cortical lesion of the inferomedial right frontal lobe with mild edema, a 2 mm mildly enhancing cortical lesion of the inferolateral left frontal lobe and a intrinsically T1 hyperintense lesion of the left lateral pons measuring 7 mm with surrounding edema.  He has 4 lesions in total, but 3 are less than 1 cm.  He has received stereotactic radiation. He had neurologic symptoms in September and was placed on dexamethasone.  MRI of the brain on 09/25/2022 revealed increased size of a hemorrhagic left pons/middle cerebellar peduncle lesion with increased surrounding edema, similar versus slightly decreased size of a 13 x 10 mm enhancing lesion in the medial right frontal lobe, and mildly diminished intrinsic T1 hyperintensity of the left temporal resection site with linear enhancement along the lateral margin, probably postsurgical/post radiation.  He has now had a brain PET scan on October 23, 2022, which does not show hyperactivity but more radiation necrosis, so these changes  are more consistent with posttreatment changes. I would like Dr. Val Riles opinion as well, but the appointment was never made after the referral last time. Dr. Barbaraann Cao ordered a MRI head done on 02/19/2023 that revealed new punctate superior right frontal lobe enhancement compatible with interval metastasis, interval regression of enhancement and edema in the  left brachium pontis and pons. There was also a vasogenic edema at a pre-existing right frontal metastasis with underlying lesion enhancement remaining stable. He will see him back in December.    Family history of breast cancer Maternal family history of breast cancer in addition to his melanoma history which raises suspicion for possible BRCA mutation.  He met with the genetic counselors and [facility] RNA + hereditary cancer panel revealed variants of uncertain significance (VUS) of the APC, CDKN1C and FH genes.  Severe pain right lower extremity He had an MRI of the lumbar spine done on 01/16/2023 revealed a broad shallow left foraminal disc protrusion at L4 - L5 with a small anular fissure. No right foraminal stenosis. Mild left foraminal stenosis and no acute osseous injury of the lumbar spine.  I think his right leg pain could be due to neuropathy but this is usually bilateral so I am not sure about what is causing it. I recommend we have him see a Retail buyer. I sent a referral to [name] - he did his brain surgery last year but now he has this new problem.  He has persistent right leg pain and xrays and MRI do not show clear explanation for it. I have tried him on gabapentin without much benefit and it doesn't seem like typical neuropathy. He is on immunotherapy, not chemotherapy so we usually don't see neuropathy with this. He is using hydrocodone 4 daily and rates the pain as 7-8. We will follow up on the referral to [name] for his opinion regarding this pain and whether to press further with the gabapentin.  Deviated Nasal Septum He mentioned this at the end of the appointment and that surgery is scheduled next week, and so I recommended we hold next weeks dose of Nivolumab to avoid interference with healing.   Plan: I increased his dose of Gabapentin 300mg  to 3 capsules three times a day. He takes about 5 hydrocodone 5-325mg  pain pills daily and I advised him to be careful how  many he takes a day as they are habit forming drugs but I will refill this. He informed me that when he went to see [name] he was told that he still had a brain tumor. I explained that we don't know how much of this is malignant and how much is radiation necrosis. He did a MRI of the thoracic spine and the patient was told that this was normal so I assume the conclusion is that his right leg pain is a form of neuropathy. I will request a copy of his scans and office visit note. He had a MRI head done on 02/19/2023 that revealed new punctate superior right frontal lobe enhancement compatible with interval metastasis, interval regression of enhancement and edema in the left brachium pontis and pons. There was also vasogenic edema at a pre-existing right frontal metastasis with underlying lesion enhancement remaining stable. I informed him that his PET scan he had done on 10/23/2022 revealed no sign of increased metastatic activity associated within the left brainstem lesion and radiation necrosis over tumor recurrence. His day 1 cycle 15 of Nivolumab is scheduled on 05/05/2023. He informed me  that he will have surgery for his deviated septum done on November 6th. I therefore recommend that we hold this cycle of Nivolumab so it wont interfere with healing. His labs today are pending and I will see him back in 4 weeks with CBC, CMP, TSH, T4 and to resume his cycle 15. He will have an MRI of the brain and follow-up with Dr. Barbaraann Cao in December. The patient understands the plans discussed today and is in agreement with them.  I have answered his questions and reviewed this information in detail.The patient knows to contact our office if he develops concerns prior to his next appointment.  I provided 30 minutes of face-to-face time during this this encounter and > 50% was spent counseling as documented under my assessment and plan.   Dellia Beckwith, MD  [facility]  AT [facility] [address] [place] [address] Dept: [phone] Dept Fax: [phone]   No orders of the defined types were placed in this encounter.   CHIEF COMPLAINT:  CC: Metastatic melanoma  Current Treatment: Brain surgery, irradiation, followed by immunotherapy  HISTORY OF PRESENT ILLNESS:  Xavier Jones is a 52 y.o. male with stage IIB (T3b N0 M0) malignant melanoma of the right posterior shoulder diagnosed in May 2021. He reported an enlarging nevus of the right shoulder for about a year.  It began to bleed, so he saw his dermatologist.  Biopsy revealed malignant melanoma, Breslow's depth 3.3 mm, Clark level IV, however, there was no overt attachment to the overlying epidermis, so a metastatic nodule could not be ruled out.  He had several other lesions removed at the time of biopsy.  Right lower lateral back shave revealed a dysplastic compound nevus with severe atypia, limited margins free.  Right lower medial back shave revealed dysplastic nevus with moderate to severe atypia, peripheral margin involved.  Left mid buttocks shave revealed dysplastic compound nevus with moderate atypia, limited margins free.  MRI head and CT chest, abdomen and pelvis in June were negative for metastatic disease. He underwent wide local excision of the right shoulder melanoma with Dr. Vinson Moselle in June.  Pathology revealed residual dermal malignant melanoma, but margins were clear.  3 sentinel lymph nodes were negative for metastasis (0/3).  However, there was a question as to whether this represents a metastatic lesion, rather than a primary, based on the presence of 2 nodules and no epidermal involvement.  He also had excision of the lower back lesion, which did not reveal malignancy. No adjuvant therapy was recommended. PET scan in September did not reveal any findings of hypermetabolic metastatic disease, there was low level activity felt to be postoperative changes in the  skin/subcutaneous tissue of the right posterior shoulder, as well as likely degenerative hypermetabolism of the proximal, posterior right femur. Due to his personal and family history of malignancy, he was scheduled for a consultation with the genetic counselor to discuss testing for hereditary cancer syndromes. [facility] testing from August 2022 revealed variants of uncertain significance (VUS) of the APC, CDKN1C and FH gene.   Oncology History  Melanoma of skin (HCC)  12/01/2019 Initial Diagnosis   Melanoma of skin (HCC)   12/05/2019 Cancer Staging   Staging form: Melanoma of the Skin, AJCC 8th Edition - Clinical stage from 12/05/2019: Stage IIA (cT3a, cN0, cM0) - Signed by Dellia Beckwith, MD on 05/30/2020   02/18/2021 Genetic Testing   No pathogenic variants detected in [facility] Multi-Cancer +RNA Panel.  Variants of uncertain significance detected in APC (c.681C>G (p.Asp227Glu)), CDKN1C (c.610C>G (p.Pro204Ala)), and FH (c.1151C>T (p.Ala384Val)).  The report date is February 18, 2021.    The Multi-Cancer + RNA Panel offered by [facility] includes sequencing and/or deletion/duplication analysis of the following 84 genes:  AIP*, ALK, APC*, ATM*, AXIN2*, BAP1*, BARD1*, BLM*, BMPR1A*, BRCA1*, BRCA2*, BRIP1*, CASR, CDC73*, CDH1*, CDK4, CDKN1B*, CDKN1C*, CDKN2A, CEBPA, CHEK2*, CTNNA1*, DICER1*, DIS3L2*, EGFR, EPCAM, FH*, FLCN*, GATA2*, GPC3, GREM1, HOXB13, HRAS, KIT, MAX*, MEN1*, MET, MITF, MLH1*, MSH2*, MSH3*, MSH6*, MUTYH*, NBN*, NF1*, NF2*, NTHL1*, PALB2*, PDGFRA, PHOX2B, PMS2*, POLD1*, POLE*, POT1*, PRKAR1A*, PTCH1*, PTEN*, RAD50*, RAD51C*, RAD51D*, RB1*, RECQL4, RET, RUNX1*, SDHA*, SDHAF2*, SDHB*, SDHC*, SDHD*, SMAD4*, SMARCA4*, SMARCB1*, SMARCE1*, STK11*, SUFU*, TERC, TERT, TMEM127*, Tp53*, TSC1*, TSC2*, VHL*, WRN*, and WT1.  RNA analysis is performed for * genes.   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 Small nonocclusive segmental level pulmonary embolism in the  inferior LEFT upper lobe.  2. Marked bronchial wall thickening with areas of patchy basilar  opacities and new ground-glass opacities in the upper lobes.  Constellation of findings may be infectious bronchiolitis/bronchitis  though the possibility of drug related changes in the setting of  immunotherapy are also considered. Furthermore, given basilar  predominance and some material in basilar bronchial structures would  correlate with any risk factors for or history of aspiration.  3. Response to therapy of bilateral metastatic lesions. Some lymph  nodes in the chest with interval increase in size are equivocal at  this time, potentially reactive, but warrant attention on subsequent  imaging.    07/22/2022 Imaging   CT chest, abdomen and pelvis:  IMPRESSION:  1. Interval positive response to therapy. Resolved subcarinal and  bilateral hilar lymphadenopathy. Stable to decreased left pulmonary  nodules. No new or progressive metastatic disease in the chest,  abdomen or pelvis.  2. One  vessel coronary atherosclerosis.  3. Aortic Atherosclerosis (ICD10-I70.0).    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 Interval positive response to therapy. Resolved subcarinal and  bilateral hilar lymphadenopathy. Stable  to decreased left pulmonary  nodules. No new or progressive metastatic disease in the chest,  abdomen or pelvis.  2. One vessel coronary atherosclerosis.  3. Aortic Atherosclerosis (ICD10-I70.0).      INTERVAL HISTORY:  Xavier Jones is here today for repeat clinical assessment of his metastatic melanoma. Patient states that he feels well but complains of right leg and foot pain rating 3/10. I increased his dose of Gabapentin 300mg  to 3 capsules three times a day. He takes about 5 hydrocodone 5-325mg  pain pills daily and I advised him to be careful how many he takes a day as they are habit forming drugs but I will refill this. He informed me that when he went to see [name] he was told that he still had a brain tumor. I explained that we don't know how much of this is malignant and how much is radiation necrosis. He did a MRI of the thoracic spine and the patient was told that this was normal so I assume the conclusion is that his right leg pain is a form of neuropathy. I will request a copy of his scans and office visit note. He had a MRI head done on 02/19/2023 that revealed new punctate superior right frontal lobe enhancement compatible with interval metastasis, interval regression of enhancement and edema in the left brachium pontis and pons. There was also vasogenic edema at a pre-existing right  frontal metastasis with underlying lesion enhancement remaining stable. I informed him that his PET scan he had done on 10/23/2022 revealed no sign of increased metastatic activity associated within the left brainstem lesion and radiation necrosis over tumor recurrence. His day 1 cycle 15 of Nivolumab is scheduled on 05/05/2023. He informed me that he will have surgery for his deviated septum done on November 6th. I therefore recommend that we hold this cycle of Nivolumab so it wont interfere with healing. His labs today are pending and I will see him back in 4 weeks with CBC, CMP, TSH, T4 and to resume his cycle 15. He will have an MRI of the brain and follow-up with Dr. Barbaraann Cao in December.  He denies signs of infection such as sore throat, sinus drainage, cough, or urinary symptoms.  He denies fevers or recurrent chills. He denies nausea, vomiting, chest pain, dyspnea or cough. His appetite is good and her weight has increased 1 pounds over last month .   I have reviewed the past medical history, past surgical history, social history and family history with the patient and they are unchanged from previous note.  ALLERGIES:  is allergic to sulfa antibiotics and testosterone.  MEDICATIONS:  Current Outpatient Medications  Medication Sig Dispense Refill   acebutolol (SECTRAL) 200 MG capsule Take 1 capsule (200 mg total) by mouth daily. 90 capsule 3   acyclovir (ZOVIRAX) 400 MG tablet Take 400 mg by mouth 3 (three) times daily.     amitriptyline (ELAVIL) 25 MG tablet Take 50 mg by mouth at bedtime.     ergocalciferol (VITAMIN D2) 1.25 MG (50000 UT) capsule Take 50,000 Units by mouth every Wednesday.     gabapentin (NEURONTIN) 300 MG capsule Take 1 capsule (300 mg total) by mouth 3 (three) times daily. 90 capsule 5   hydrOXYzine (ATARAX) 25 MG tablet TAKE ONE TABLET BY MOUTH 3 TIMES A DAY AS NEEDED FOR ITCHING 60 tablet 3   lidocaine-prilocaine (EMLA) cream Apply 1 Application topically as needed. 30 g  0   pentoxifylline (TRENTAL) 400 MG CR tablet Take 1  tablet (400 mg total) by mouth in the morning and at bedtime. 60 tablet 3   rivaroxaban (XARELTO) 20 MG TABS tablet Take 1 tablet (20 mg total) by mouth daily with supper. Start after completing 3 weeks of Xarelto 15 mg twice daily 30 tablet 3   vitamin E 180 MG (400 UNITS) capsule Take 1 capsule (400 Units total) by mouth daily. 30 capsule 2   No current facility-administered medications for this visit.    REVIEW OF SYSTEMS:   Review of Systems  Constitutional: Negative.  Negative for appetite change, chills, diaphoresis, fatigue, fever and unexpected weight change.  HENT:   Positive for tinnitus. Negative for hearing loss, lump/mass, mouth sores, nosebleeds, sore throat, trouble swallowing and voice change.        Facial numbness of the left side with left facial droop  Eyes:  Positive for eye problems (cant close left eye). Negative for icterus.       States his vision are blurry  Respiratory: Negative.  Negative for chest tightness, cough, hemoptysis, shortness of breath and wheezing.   Cardiovascular: Negative.  Negative for chest pain, leg swelling and palpitations.  Gastrointestinal: Negative.  Negative for abdominal distention, abdominal pain, blood in stool, constipation, diarrhea, nausea, rectal pain and vomiting.  Endocrine: Negative.  Negative for hot flashes.  Genitourinary: Negative.  Negative for bladder incontinence, difficulty urinating, dyspareunia, dysuria, frequency, hematuria, nocturia, pelvic pain and penile discharge.   Musculoskeletal:  Positive for myalgias. Negative for arthralgias, back pain, flank pain, gait problem, neck pain and neck stiffness.       Right leg and foot pain rating 3/10  Skin: Negative.  Negative for itching, rash and wound.  Neurological:  Positive for dizziness, headaches (comes in waves), numbness (painful neuropathy in his legs) and speech difficulty. Negative for extremity weakness, gait  problem, light-headedness and seizures.  Hematological: Negative.  Negative for adenopathy. Does not bruise/bleed easily.  Psychiatric/Behavioral: Negative.  Negative for confusion, decreased concentration, depression, sleep disturbance and suicidal ideas. The patient is not nervous/anxious.    Marland Kitchen   VITALS:  Blood pressure (!) 145/88, pulse 69, temperature (!) 97.5 F (36.4 C), temperature source Oral, resp. rate 17, height 5\' 9"  (1.753 m), weight 174 lb 3.2 oz (79 kg), SpO2 98%.  Wt Readings from Last 3 Encounters:  04/30/23 174 lb 3.2 oz (79 kg)  04/02/23 173 lb (78.5 kg)  03/10/23 179 lb 0.8 oz (81.2 kg)    Body mass index is 25.72 kg/m.  Performance status (ECOG): 1 - Symptomatic but completely ambulatory  PHYSICAL EXAM:    Physical Exam Vitals and nursing note reviewed.  Constitutional:      General: He is not in acute distress.    Appearance: Normal appearance. He is normal weight. He is not ill-appearing, toxic-appearing or diaphoretic.  HENT:     Head: Normocephalic and atraumatic.     Right Ear: Tympanic membrane, ear canal and external ear normal. There is no impacted cerumen.     Left Ear: Tympanic membrane, ear canal and external ear normal. There is no impacted cerumen.     Nose: Nose normal. No congestion or rhinorrhea.     Mouth/Throat:     Mouth: Mucous membranes are moist.     Pharynx: Oropharynx is clear. No oropharyngeal exudate or posterior oropharyngeal erythema.  Eyes:     General: No scleral icterus.       Right eye: No discharge.        Left eye: No discharge.  Extraocular Movements: Extraocular movements intact.     Conjunctiva/sclera: Conjunctivae normal.     Pupils: Pupils are equal, round, and reactive to light.  Neck:     Vascular: No carotid bruit.  Cardiovascular:     Rate and Rhythm: Normal rate and regular rhythm.     Pulses: Normal pulses.     Heart sounds: Normal heart sounds. No murmur heard.    No friction rub. No gallop.   Pulmonary:     Effort: Pulmonary effort is normal. No respiratory distress.     Breath sounds: Normal breath sounds. No stridor. No wheezing, rhonchi or rales.  Chest:     Chest wall: No tenderness.  Abdominal:     General: Bowel sounds are normal. There is no distension.     Palpations: Abdomen is soft. There is no hepatomegaly, splenomegaly or mass.     Tenderness: There is no abdominal tenderness. There is no right CVA tenderness, left CVA tenderness, guarding or rebound.     Hernia: No hernia is present.  Musculoskeletal:        General: No swelling, tenderness, deformity or signs of injury. Normal range of motion.     Cervical back: Normal range of motion and neck supple. No rigidity or tenderness.     Right lower leg: No edema.     Left lower leg: No edema.     Comments: Large deep scar in the right posterior shoulder that is well healed.   Lymphadenopathy:     Cervical: No cervical adenopathy.     Upper Body:     Right upper body: No supraclavicular or axillary adenopathy.     Left upper body: No supraclavicular or axillary adenopathy.     Lower Body: No right inguinal adenopathy. No left inguinal adenopathy.  Skin:    General: Skin is warm and dry.     Coloration: Skin is not jaundiced.     Findings: No bruising, lesion or rash.  Neurological:     General: No focal deficit present.     Mental Status: He is alert and oriented to person, place, and time. Mental status is at baseline.     Cranial Nerves: Facial asymmetry present. No cranial nerve deficit.     Sensory: No sensory deficit.     Motor: No weakness.     Coordination: Coordination normal.     Gait: Gait normal.     Deep Tendon Reflexes: Reflexes normal.     Comments: Left facial droop  Psychiatric:        Mood and Affect: Mood normal.        Behavior: Behavior normal.        Thought Content: Thought content normal.        Judgment: Judgment normal.    LABORATORY DATA:  I have reviewed the data as listed     Component Value Date/Time   NA 138 04/30/2023 0935   NA 143 01/05/2023 0000   K 4.2 04/30/2023 0935   CL 102 04/30/2023 0935   CO2 27 04/30/2023 0935   GLUCOSE 89 04/30/2023 0935   BUN 17 04/30/2023 0935   BUN 9 01/05/2023 0000   CREATININE 0.87 04/30/2023 0935   CALCIUM 9.2 04/30/2023 0935   PROT 7.4 04/30/2023 0935   PROT 6.2 (A) 05/08/2022 0000   ALBUMIN 4.1 04/30/2023 0935   AST 18 04/30/2023 0935   ALT 16 04/30/2023 0935   ALKPHOS 76 04/30/2023 0935   BILITOT 0.5 04/30/2023 0935   GFRNONAA >  60 04/30/2023 0935   No results found for: "SPEP", "UPEP"  Lab Results  Component Value Date   WBC 7.6 04/30/2023   NEUTROABS 5.1 04/30/2023   HGB 12.7 (L) 04/30/2023   HCT 39.6 04/30/2023   MCV 95.4 04/30/2023   PLT 315 04/30/2023     Chemistry      Component Value Date/Time   NA 138 04/30/2023 0935   NA 143 01/05/2023 0000   K 4.2 04/30/2023 0935   CL 102 04/30/2023 0935   CO2 27 04/30/2023 0935   BUN 17 04/30/2023 0935   BUN 9 01/05/2023 0000   CREATININE 0.87 04/30/2023 0935   GLU 95 01/05/2023 0000      Component Value Date/Time   CALCIUM 9.2 04/30/2023 0935   ALKPHOS 76 04/30/2023 0935   AST 18 04/30/2023 0935   ALT 16 04/30/2023 0935   BILITOT 0.5 04/30/2023 0935     Component Ref Range & Units 12/11/2022 1 mo ago 2 mo ago 3 mo ago 4 mo ago 5 mo ago 6 mo ago  T4, Total 4.5 - 12.0 ug/dL 8.1 7.7 CM 7.4 CM 8.3 CM 7.4 CM 7.0 CM 7.2 CM   Component Ref Range & Units 12/11/2022   1 mo ago 2 mo ago 3 mo ago 4 mo ago 5 mo ago 6 mo ago  TSH 0.350 - 4.500 uIU/mL 1.371 0.804 CM 0.790 CM 0.757 CM 0.761 CM 1.550 CM 1.581 CM    RADIOGRAPHIC STUDIES: I have personally reviewed the radiological images as listed and agreed with the findings in the report. No results found.  EXAM:  01/16/2023 MRI OF THE LUMBAR SPINE WITHOUT AND WITH CONTRAST IMPRESSION A T L4-L5 there is a broad shallow left foraminal disc protrusion with a small anular fissure. No right foraminal  stenosis. Mild left foraminal stenosis  No acute osseous injury of the lumbar spine.   EXAM - 01/05/2023 CT CHEST ABDOMEN AND PELVIS WITH CONTRAST IMPRESSION Subcarinal and bilateral hilar lymphadenopathy remains resolved. Unchanged small pulmonary nodules of the left lung. No new nodules. Unchanged L1 corpectomy with lateral bridging fusion of T12 through L2. No evidence of new metastatic disease in the chest, abdomen, or pelvis. Coronary artery disease.   EXAM: 11/19/2022 MRI HEAD WITHOUT AND WITH CONTRAST IMPRESSION: 1. No progression of metastatic disease. 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 Two other enhancing treatment sites remain stable.   2. No new metastatic disease or acute intracranial abnormality is identified.  EXAM: 10/23/2022 NM PET METABOLIC BRAIN  IMPRESSION: No increased metabolic activity associated with the LEFT brainstem lesion identified on coregistered MRI. 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    EXAM: 09/25/2022 MRI HEAD WITHOUT AND WITH CONTRAST IMPRESSION: 1. Increased size of a hemorrhagic left pons/middle cerebellar peduncle lesion with increased surrounding edema, described above. 2. Similar versus slightly decreased size of a 13 x 10 mm enhancing lesion in the medial right frontal lobe. 3. Mildly diminished intrinsic T1 hyperintensity of the left temporal resection site with linear enhancement along the lateral margin, probably postsurgical. Recommend continued attention on follow-up.       I,Jasmine M Lassiter,acting as a scribe for Dellia Beckwith, MD.,have documented all relevant documentation on the behalf of Dellia Beckwith, MD,as directed by  Dellia Beckwith, MD while in the presence of Dellia Beckwith, MD.

## 2023-04-30 ENCOUNTER — Encounter: Payer: Self-pay | Admitting: Oncology

## 2023-04-30 ENCOUNTER — Telehealth: Payer: Self-pay

## 2023-04-30 ENCOUNTER — Inpatient Hospital Stay: Payer: Medicare Other

## 2023-04-30 ENCOUNTER — Inpatient Hospital Stay: Payer: Medicare Other | Admitting: Oncology

## 2023-04-30 ENCOUNTER — Other Ambulatory Visit: Payer: Self-pay | Admitting: Oncology

## 2023-04-30 ENCOUNTER — Other Ambulatory Visit: Payer: Self-pay | Admitting: Pharmacist

## 2023-04-30 VITALS — BP 145/88 | HR 69 | Temp 97.5°F | Resp 17 | Ht 69.0 in | Wt 174.2 lb

## 2023-04-30 DIAGNOSIS — C7931 Secondary malignant neoplasm of brain: Secondary | ICD-10-CM | POA: Diagnosis not present

## 2023-04-30 DIAGNOSIS — C439 Malignant melanoma of skin, unspecified: Secondary | ICD-10-CM | POA: Diagnosis not present

## 2023-04-30 DIAGNOSIS — C7800 Secondary malignant neoplasm of unspecified lung: Secondary | ICD-10-CM

## 2023-04-30 DIAGNOSIS — C787 Secondary malignant neoplasm of liver and intrahepatic bile duct: Secondary | ICD-10-CM

## 2023-04-30 DIAGNOSIS — Z23 Encounter for immunization: Secondary | ICD-10-CM | POA: Diagnosis not present

## 2023-04-30 DIAGNOSIS — C4361 Malignant melanoma of right upper limb, including shoulder: Secondary | ICD-10-CM | POA: Diagnosis not present

## 2023-04-30 DIAGNOSIS — C7801 Secondary malignant neoplasm of right lung: Secondary | ICD-10-CM | POA: Diagnosis not present

## 2023-04-30 DIAGNOSIS — M5126 Other intervertebral disc displacement, lumbar region: Secondary | ICD-10-CM

## 2023-04-30 DIAGNOSIS — Z5112 Encounter for antineoplastic immunotherapy: Secondary | ICD-10-CM | POA: Diagnosis not present

## 2023-04-30 DIAGNOSIS — G629 Polyneuropathy, unspecified: Secondary | ICD-10-CM

## 2023-04-30 DIAGNOSIS — C7802 Secondary malignant neoplasm of left lung: Secondary | ICD-10-CM | POA: Diagnosis not present

## 2023-04-30 DIAGNOSIS — M79604 Pain in right leg: Secondary | ICD-10-CM

## 2023-04-30 LAB — CBC WITH DIFFERENTIAL (CANCER CENTER ONLY)
Abs Immature Granulocytes: 0.02 10*3/uL (ref 0.00–0.07)
Basophils Absolute: 0.1 10*3/uL (ref 0.0–0.1)
Basophils Relative: 1 %
Eosinophils Absolute: 0.2 10*3/uL (ref 0.0–0.5)
Eosinophils Relative: 2 %
HCT: 39.6 % (ref 39.0–52.0)
Hemoglobin: 12.7 g/dL — ABNORMAL LOW (ref 13.0–17.0)
Immature Granulocytes: 0 %
Lymphocytes Relative: 20 %
Lymphs Abs: 1.5 10*3/uL (ref 0.7–4.0)
MCH: 30.6 pg (ref 26.0–34.0)
MCHC: 32.1 g/dL (ref 30.0–36.0)
MCV: 95.4 fL (ref 80.0–100.0)
Monocytes Absolute: 0.8 10*3/uL (ref 0.1–1.0)
Monocytes Relative: 10 %
Neutro Abs: 5.1 10*3/uL (ref 1.7–7.7)
Neutrophils Relative %: 67 %
Platelet Count: 315 10*3/uL (ref 150–400)
RBC: 4.15 MIL/uL — ABNORMAL LOW (ref 4.22–5.81)
RDW: 13.6 % (ref 11.5–15.5)
WBC Count: 7.6 10*3/uL (ref 4.0–10.5)
nRBC: 0 % (ref 0.0–0.2)

## 2023-04-30 LAB — CMP (CANCER CENTER ONLY)
ALT: 16 U/L (ref 0–44)
AST: 18 U/L (ref 15–41)
Albumin: 4.1 g/dL (ref 3.5–5.0)
Alkaline Phosphatase: 76 U/L (ref 38–126)
Anion gap: 9 (ref 5–15)
BUN: 17 mg/dL (ref 6–20)
CO2: 27 mmol/L (ref 22–32)
Calcium: 9.2 mg/dL (ref 8.9–10.3)
Chloride: 102 mmol/L (ref 98–111)
Creatinine: 0.87 mg/dL (ref 0.61–1.24)
GFR, Estimated: >60 mL/min (ref >60)
Glucose, Bld: 89 mg/dL (ref 70–99)
Potassium: 4.2 mmol/L (ref 3.5–5.1)
Sodium: 138 mmol/L (ref 135–145)
Total Bilirubin: 0.5 mg/dL (ref 0.3–1.2)
Total Protein: 7.4 g/dL (ref 6.5–8.1)

## 2023-04-30 LAB — TSH: TSH: 1.824 u[IU]/mL (ref 0.350–4.500)

## 2023-04-30 MED ORDER — HYDROCODONE-ACETAMINOPHEN 5-325 MG PO TABS: 1.0000 | ORAL_TABLET | ORAL | 0 refills | Status: DC | PRN

## 2023-04-30 NOTE — Telephone Encounter (Signed)
-----   Message from Dellia Beckwith sent at 04/30/2023 10:41 AM EDT ----- Regarding: records Pls request note and MRI T spine from [name]

## 2023-04-30 NOTE — Telephone Encounter (Signed)
Office will be sending over the MRI and office note.

## 2023-05-01 LAB — T4: T4, Total: 6.4 ug/dL (ref 4.5–12.0)

## 2023-05-05 ENCOUNTER — Ambulatory Visit: Payer: Medicare Other

## 2023-05-09 ENCOUNTER — Other Ambulatory Visit: Payer: Self-pay | Admitting: Oncology

## 2023-05-09 DIAGNOSIS — C801 Malignant (primary) neoplasm, unspecified: Secondary | ICD-10-CM

## 2023-05-09 DIAGNOSIS — G629 Polyneuropathy, unspecified: Secondary | ICD-10-CM

## 2023-05-09 DIAGNOSIS — G588 Other specified mononeuropathies: Secondary | ICD-10-CM

## 2023-05-09 HISTORY — DX: Polyneuropathy, unspecified: G62.9

## 2023-05-13 DIAGNOSIS — Z7901 Long term (current) use of anticoagulants: Secondary | ICD-10-CM | POA: Diagnosis not present

## 2023-05-13 DIAGNOSIS — C7800 Secondary malignant neoplasm of unspecified lung: Secondary | ICD-10-CM | POA: Diagnosis not present

## 2023-05-13 DIAGNOSIS — N3289 Other specified disorders of bladder: Secondary | ICD-10-CM | POA: Diagnosis not present

## 2023-05-13 DIAGNOSIS — G4700 Insomnia, unspecified: Secondary | ICD-10-CM | POA: Diagnosis not present

## 2023-05-13 DIAGNOSIS — F32A Depression, unspecified: Secondary | ICD-10-CM | POA: Diagnosis not present

## 2023-05-13 DIAGNOSIS — H9192 Unspecified hearing loss, left ear: Secondary | ICD-10-CM | POA: Diagnosis not present

## 2023-05-13 DIAGNOSIS — C7931 Secondary malignant neoplasm of brain: Secondary | ICD-10-CM | POA: Diagnosis not present

## 2023-05-13 DIAGNOSIS — Z86711 Personal history of pulmonary embolism: Secondary | ICD-10-CM | POA: Diagnosis not present

## 2023-05-13 DIAGNOSIS — E559 Vitamin D deficiency, unspecified: Secondary | ICD-10-CM | POA: Diagnosis not present

## 2023-05-13 DIAGNOSIS — Z79899 Other long term (current) drug therapy: Secondary | ICD-10-CM | POA: Diagnosis not present

## 2023-05-13 DIAGNOSIS — A6000 Herpesviral infection of urogenital system, unspecified: Secondary | ICD-10-CM | POA: Diagnosis not present

## 2023-05-13 DIAGNOSIS — J343 Hypertrophy of nasal turbinates: Secondary | ICD-10-CM | POA: Diagnosis not present

## 2023-05-13 DIAGNOSIS — N400 Enlarged prostate without lower urinary tract symptoms: Secondary | ICD-10-CM | POA: Diagnosis not present

## 2023-05-13 DIAGNOSIS — R0981 Nasal congestion: Secondary | ICD-10-CM | POA: Diagnosis not present

## 2023-05-13 DIAGNOSIS — Z981 Arthrodesis status: Secondary | ICD-10-CM | POA: Diagnosis not present

## 2023-05-13 DIAGNOSIS — J342 Deviated nasal septum: Secondary | ICD-10-CM | POA: Diagnosis not present

## 2023-05-13 DIAGNOSIS — E785 Hyperlipidemia, unspecified: Secondary | ICD-10-CM | POA: Diagnosis not present

## 2023-05-13 DIAGNOSIS — C779 Secondary and unspecified malignant neoplasm of lymph node, unspecified: Secondary | ICD-10-CM | POA: Diagnosis not present

## 2023-05-13 DIAGNOSIS — K219 Gastro-esophageal reflux disease without esophagitis: Secondary | ICD-10-CM | POA: Diagnosis not present

## 2023-05-13 DIAGNOSIS — C439 Malignant melanoma of skin, unspecified: Secondary | ICD-10-CM | POA: Diagnosis not present

## 2023-05-13 DIAGNOSIS — C787 Secondary malignant neoplasm of liver and intrahepatic bile duct: Secondary | ICD-10-CM | POA: Diagnosis not present

## 2023-05-13 HISTORY — PX: SEPTOPLASTY: SUR1290

## 2023-05-14 DIAGNOSIS — Z9889 Other specified postprocedural states: Secondary | ICD-10-CM | POA: Diagnosis not present

## 2023-05-22 ENCOUNTER — Encounter: Payer: Self-pay | Admitting: Oncology

## 2023-05-22 DIAGNOSIS — Z9889 Other specified postprocedural states: Secondary | ICD-10-CM | POA: Diagnosis not present

## 2023-05-28 DIAGNOSIS — Z9889 Other specified postprocedural states: Secondary | ICD-10-CM | POA: Diagnosis not present

## 2023-05-29 NOTE — Progress Notes (Addendum)
[facility]  [address] [place],  [place]  [address] [phone]  Clinic Day:  06/02/2023  Referring physician: [name], NP  ASSESSMENT & PLAN:   Assessment & Plan: Melanoma of skin [facility]) Stage IIB malignant melanoma of the right posterior shoulder, Breslow's depth 3.3 mm, Clark level IV, treated with wide local excision, diagnosed in May 2021.  There was still a question as to whether this represented a metastatic lesion, rather than a primary, based on the presence of 2 nodules and no epidermal involvement.  MRI head and CT chest, abdomen and pelvis in June were negative for metastatic disease.   No adjuvant therapy was recommended.  PET scan in September was negative for metastatic disease at that time.  By June 2023, he was found to have widespread metastatic disease including to the brain.  CT scans in January 2024 revealed resolved subcarinal and bilateral hilar lymphadenopathy. Stable to decreased left pulmonary nodules. No new or progressive metastatic disease in the chest, abdomen or pelvis. Most recent CT scans in July 2024 stable. We will repeat imaging in July, 2025.    Metastasis to subcutaneous tissue  He presented with a new area of concern to the left mid back in June 2023.  A superficial nodule was noted approximately 1 cm to the right of his upper thoracic spine,and it had increased in size. He was sent for biopsy which revealed metastatic malignant melanoma.   He has several hypermetabolic subcutaneous nodules seen on PET scan which have resolved with treatment.  Metastasis to liver He had a new small hypermetabolic liver lesion in June 5284.  This was less than 1 cm, but suspicious for metastasis.  This is no longer present on repeat imaging.  Metastasis to lymph nodes He had multiple hypermetabolic nodes of the retroperitoneal and right pelvic areas, as well as right external iliac and a few additional nodes. These have resolved.  Pulmonary  metastases He had bilateral hypermetabolic pulmonary nodules which increased in size by the time treatment was started.  The largest nodule is up to 2.3 cm in diameter. CT in January 2024 revealed a good response.  Most recent CT chest in July shows 2 tiny 4 mm nodules, which are persistent and unchanged. There are no other suspicious or new nodules.  Metastases to brain This was suspicious on the PET scan in the left frontoparietal lobe.  MRI of the brain confirmed a 2.6 cm heterogeneous lesion of the left superior temporal gyrus with mild enhancement and extensive surrounding edema.  He also has an 8 mm cortical lesion of the inferomedial right frontal lobe with mild edema, a 2 mm mildly enhancing cortical lesion of the inferolateral left frontal lobe and a intrinsically T1 hyperintense lesion of the left lateral pons measuring 7 mm with surrounding edema.  He has 4 lesions in total, but 3 are less than 1 cm.  He was treated with surgical resection of the larger lesion and stereotactic radiation.   He had recurrent neurologic symptoms with left-sided facial droop and left-sided weakness in September 2023 and was placed on dexamethasone 4 mg 3 times daily.  Brain revealed thin linear enhancement along the resection margin of the left temporal resection cavity, which may be postoperative, with additional small areas of somewhat more nodular enhancement, which are nonspecific, but could indicate recurrent disease. Attention on follow-up. Interval increase in the size of the right inferior frontal gyrus enhancing lesion with increased surrounding edema, mild local mass  effect, and mild local midline shift. Stable to slightly decreased size of the left pontine lesion with decreased associated edema. Previously noted left inferior frontal gyrus lesion is no longer visualized. No definite new lesion.  Dr. Barbaraann Cao and his neurologic symptoms have improved, so he was slowly weaned off dexamethasone.  MRI brain in  December 2023 was stable.  Continued attention on follow-up was recommended.  He has had persistent left facial weakness.  MRI brain in March revealed increased size of a hemorrhagic left pons/middle cerebellar peduncle lesion with increased surrounding edema, similar versus slightly decreased size of a 13 x 10 mm enhancing lesion in the medial right frontal lobe, and mildly diminished intrinsic T1 hyperintensity of the left temporal resection site with linear enhancement along the lateral margin, probably postsurgical/post radiation.  PET brain in April did not show hyperactivity, but more radiation necrosis, so these changes are more consistent with posttreatment changes.  We recommended he see [name], but apparently this appointment was not made.  MRI head in August revealed new punctate superior right frontal lobe enhancement compatible with interval metastasis, interval regression of enhancement and edema in the left brachium pontis and pons. There was also a vasogenic edema at a pre-existing right frontal metastasis with underlying lesion enhancement remaining stable.  Dr. Barbaraann Cao recommended continued observation and will see him back in December with a repeat MRI head.    Family history of breast cancer Maternal family history of breast cancer in addition to his melanoma history which raises suspicion for possible BRCA mutation.  He met with the genetic counselors and [facility] RNA + hereditary cancer panel revealed variants of uncertain significance (VUS) of the APC, CDKN1C and FH genes.   Severe pain right lower extremity This is felt to possibly represent neuropathy from his immunotherapy.  MRI lumbar spine and July revealed a broad shallow left foraminal disc protrusion at L4 - L5 with a small anular fissure. No right foraminal stenosis. Mild left foraminal stenosis and no acute osseous injury of the lumbar spine.  We referred him back to  [name] who did his brain surgery.  He has  persistent right leg pain despite gabapentin.  Xrays and MRI do not show clear explanation for it.  We have titrated up to gabapentin.  He is currently on 900 mg 3 times a day.  He is using hydrocodone 4 daily and rates the pain as 7-8.  He saw [name] and had a MRI thoracic spine.  I will have to move request the report.  Dr. Barbaraann Cao suggested we could we will increase his gabapentin further.  I will increase the gabapentin 300 mg to 4 tablets TID.  Anemia Recurrent, slowly worsening anemia.  His ferritin is decreased.  I will have him start a multivitamin with iron.  Will continue to monitor his blood counts.   We will plan to see him back in 4 weeks with a CBC and comprehensive metabolic panel prior to his next nivolumab.  The patient understands the plans discussed today and is in agreement with them.  He knows to contact our office if he develops concerns prior to his next appointment.     60 minutes was spent in patient care.  This included time spent preparing to see the patient (e.g., review of tests), obtaining and/or reviewing separately obtained history, counseling and educating the patient/family/caregiver, ordering medications, tests, or procedures; documenting clinical information in the electronic or other health record, independently interpreting results and communicating results to  the patient/family/caregiver as well as coordination of care.   Adah Perl, PA-C  [facility] [address] [place] [address] Dept: [phone] Dept Fax: [phone]   Orders Placed This Encounter  Procedures   Ferritin    Standing Status:   Future    Number of Occurrences:   1    Standing Expiration Date:   06/01/2024   Folate    Standing Status:   Future    Number of Occurrences:   1    Standing Expiration Date:   06/01/2024   Haptoglobin    Standing Status:   Future    Number of Occurrences:    1    Standing Expiration Date:   06/01/2024   Iron and TIBC    Standing Status:   Future    Number of Occurrences:   1    Standing Expiration Date:   06/01/2024   Vitamin B12    Standing Status:   Future    Number of Occurrences:   1    Standing Expiration Date:   06/01/2024    CHIEF COMPLAINT:  CC: Metastatic melanoma   Current Treatment:  Brain surgery, irradiation, followed by immunotherapy   HISTORY OF PRESENT ILLNESS:  Xavier Jones is a 52 y.o. male with stage IIB (T3b N0 M0) malignant melanoma of the right posterior shoulder diagnosed in May 2021. He reported an enlarging nevus of the right shoulder for about a year.  It began to bleed, so he saw his dermatologist.  Biopsy revealed malignant melanoma, Breslow's depth 3.3 mm, Clark level IV, however, there was no overt attachment to the overlying epidermis, so a metastatic nodule could not be ruled out.  He had several other lesions removed at the time of biopsy.  Right lower lateral back shave revealed a dysplastic compound nevus with severe atypia, limited margins free.  Right lower medial back shave revealed dysplastic nevus with moderate to severe atypia, peripheral margin involved.  Left mid buttocks shave revealed dysplastic compound nevus with moderate atypia, limited margins free.  MRI head and CT chest, abdomen and pelvis in June were negative for metastatic disease. He underwent wide local excision of the right shoulder melanoma with Dr. Vinson Moselle in June.  Pathology revealed residual dermal malignant melanoma, but margins were clear.  3 sentinel lymph nodes were negative for metastasis (0/3).  However, there was a question as to whether this represents a metastatic lesion, rather than a primary, based on the presence of 2 nodules and no epidermal involvement.  He also had excision of the lower back lesion, which did not reveal malignancy. PET scan in September 2021 did not reveal any findings of hypermetabolic metastatic  disease, there was low level activity felt to be postoperative changes in the skin/subcutaneous tissue of the right posterior shoulder, as well as likely degenerative hypermetabolism of the proximal, posterior right femur.  No adjuvant therapy was recommended.  Due to his personal and family history of malignancy, he was scheduled for a consultation with the genetic counselor to discuss testing for hereditary cancer syndromes. [facility] testing from August 2022 revealed variants of uncertain significance (VUS) of the APC, CDKN1C and FH gene.  Unfortunately, he developed metastatic disease in June 2023.  He had palpable subcutaneous nodules.  CT chest revealed 4 subcutaneous nodules in the back worrisome for metastatic disease. At least 20 pulmonary nodules are identified, involving all lobes of  both lungs consistent with metastatic disease.  Biopsy of a subcutaneous nodule was positive for metastatic melanoma.  MRI head revealed four metastatic lesions as described with the largest within the  left temporal lobe.  Patient oncology and recommended for stereotactic radiosurgery.  He was referred to [name] and later underwent craniotomy with resection of the left temporal lobe mass.  He was then placed on palliative immunotherapy.  He received 4 cycles of nivolumab/ipilimumab July to October 2023.  He had recurrent neurologic symptoms in September 2023 and MRI showed possible recurrence and need craniotomy and.  He was based on dexamethasone briefly.  He was also found to have a pulmonary embolism when he developed cough in September 2023 which was treated with apixaban.  On maintenance nivolumab in November 2023.  On repeat imaging he has shown to have a response with resolution of lymphadenopathy and a decrease in the pulmonary nodules and subcutaneous metastases.    Oncology History  Melanoma of skin (HCC)  12/01/2019 Initial Diagnosis   Melanoma of skin (HCC)   12/05/2019 Cancer Staging    Staging form: Melanoma of the Skin, AJCC 8th Edition - Clinical stage from 12/05/2019: Stage IIA (cT3a, cN0, cM0) - Signed by Dellia Beckwith, MD on 05/30/2020   02/18/2021 Genetic Testing   No pathogenic variants detected in [facility] Multi-Cancer +RNA Panel.  Variants of uncertain significance detected in APC (c.681C>G (p.Asp227Glu)), CDKN1C (c.610C>G (p.Pro204Ala)), and FH (c.1151C>T (p.Ala384Val)).  The report date is February 18, 2021.    The Multi-Cancer + RNA Panel offered by [facility] includes sequencing and/or deletion/duplication analysis of the following 84 genes:  AIP*, ALK, APC*, ATM*, AXIN2*, BAP1*, BARD1*, BLM*, BMPR1A*, BRCA1*, BRCA2*, BRIP1*, CASR, CDC73*, CDH1*, CDK4, CDKN1B*, CDKN1C*, CDKN2A, CEBPA, CHEK2*, CTNNA1*, DICER1*, DIS3L2*, EGFR, EPCAM, FH*, FLCN*, GATA2*, GPC3, GREM1, HOXB13, HRAS, KIT, MAX*, MEN1*, MET, MITF, MLH1*, MSH2*, MSH3*, MSH6*, MUTYH*, NBN*, NF1*, NF2*, NTHL1*, PALB2*, PDGFRA, PHOX2B, PMS2*, POLD1*, POLE*, POT1*, PRKAR1A*, PTCH1*, PTEN*, RAD50*, RAD51C*, RAD51D*, RB1*, RECQL4, RET, RUNX1*, SDHA*, SDHAF2*, SDHB*, SDHC*, SDHD*, SMAD4*, SMARCA4*, SMARCB1*, SMARCE1*, STK11*, SUFU*, TERC, TERT, TMEM127*, Tp53*, TSC1*, TSC2*, VHL*, WRN*, and WT1.  RNA analysis is performed for * genes.   01/30/2022 - 02/20/2022 Chemotherapy   Patient is on Treatment Plan : MELANOMA Nivolumab + Ipilimumab (1/3) q21d / Nivolumab q28d     01/30/2022 -  Chemotherapy   Patient is on Treatment Plan : MELANOMA Nivolumab (480) q28d     Malignant melanoma metastatic to brain (HCC)  12/19/2021 Initial Diagnosis   Malignant melanoma metastatic to brain (HCC)   01/30/2022 - 02/20/2022 Chemotherapy   Patient is on Treatment Plan : MELANOMA Nivolumab + Ipilimumab (1/3) q21d / Nivolumab q28d     01/30/2022 -  Chemotherapy   Patient is on Treatment Plan : MELANOMA Nivolumab (480) q28d     Malignant melanoma metastatic to lung (HCC)  12/19/2021 Initial Diagnosis   Malignant melanoma metastatic to lung  (HCC)   01/30/2022 - 02/20/2022 Chemotherapy   Patient is on Treatment Plan : MELANOMA Nivolumab + Ipilimumab (1/3) q21d / Nivolumab q28d     01/30/2022 -  Chemotherapy   Patient is on Treatment Plan : MELANOMA Nivolumab (480) q28d     03/24/2022 Imaging   CTA chest:  IMPRESSION:  1. Small nonocclusive segmental level pulmonary embolism in the  inferior LEFT upper lobe.  2. Marked bronchial wall thickening with areas of patchy basilar  opacities and new ground-glass opacities in the upper lobes.  Constellation of findings may be  infectious bronchiolitis/bronchitis  though the possibility of drug related changes in the setting of  immunotherapy are also considered. Furthermore, given basilar  predominance and some material in basilar bronchial structures would  correlate with any risk factors for or history of aspiration.  3. Response to therapy of bilateral metastatic lesions. Some lymph  nodes in the chest with interval increase in size are equivocal at  this time, potentially reactive, but warrant attention on subsequent  imaging.    07/22/2022 Imaging   CT chest, abdomen and pelvis:  IMPRESSION:  1. Interval positive response to therapy. Resolved subcarinal and  bilateral hilar lymphadenopathy. Stable to decreased left pulmonary  nodules. No new or progressive metastatic disease in the chest,  abdomen or pelvis.  2. One vessel coronary atherosclerosis.  3. Aortic Atherosclerosis (ICD10-I70.0).    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 Interval positive response to therapy. Resolved subcarinal and   bilateral hilar lymphadenopathy. Stable to decreased left pulmonary  nodules. No new or progressive metastatic disease in the chest,  abdomen or pelvis.  2. One vessel coronary atherosclerosis.  3. Aortic Atherosclerosis (ICD10-I70.0).      INTERVAL HISTORY:  Briley is here today for repeat clinical assessment. He complains fatigue.  He continues to have intermittent diarrhea with several small loose stools.  He has persistent  left facial weakness.  He reports persistent lower back pain with radiation down the right leg, as well as a burning sensation of the right foot when mild swelling. He currently takes gabapentin 900 mg TID and about 5 hydrocodone 5-325mg  tablets while awake for the pain.  His appetite is good.  His weight has increased 2 pounds over last month . He continues to work.  He sees Dr. Barbaraann Cao with MRI head in December. He denies fevers or chills.  REVIEW OF SYSTEMS:  Review of Systems  Constitutional:  Positive for fatigue. Negative for appetite change, chills, diaphoresis, fever and unexpected weight change.  HENT:   Negative for lump/mass, mouth sores and sore throat.   Eyes:  Positive for eye problems (dryness due to difficulty closing).  Respiratory:  Negative for cough and shortness of breath.   Cardiovascular:  Negative for chest pain and leg swelling.  Gastrointestinal:  Positive for diarrhea (4 diarrheal stools a day). Negative for abdominal pain, constipation, nausea and vomiting.  Endocrine: Negative.   Genitourinary:  Negative for bladder incontinence, difficulty urinating, dysuria, frequency and hematuria.   Musculoskeletal:  Positive for back pain (chronic lower back pain radiating down right leg). Negative for arthralgias and myalgias.  Skin:  Negative for itching, rash and wound.  Neurological:  Negative for dizziness, extremity weakness, headaches, light-headedness and numbness.       Burning sensation and mild swelling of the right foot  Hematological:   Negative for adenopathy.  Psychiatric/Behavioral:  Negative for depression and sleep disturbance. The patient is not nervous/anxious.      VITALS:  Blood pressure 138/81, pulse 91, temperature 98.2 F (36.8 C), temperature source Oral, resp. rate 18, height 5\' 9"  (1.753 m), weight 176 lb 14.4 oz (80.2  kg), SpO2 100%.  Wt Readings from Last 3 Encounters:  06/02/23 176 lb 14.4 oz (80.2 kg)  04/30/23 174 lb 3.2 oz (79 kg)  04/02/23 173 lb (78.5 kg)    Body mass index is 26.12 kg/m.  Performance status (ECOG): 1 - Symptomatic but completely ambulatory  PHYSICAL EXAM:  Physical Exam Vitals and nursing note reviewed.  Constitutional:      General: He is not in acute distress.    Appearance: Normal appearance. He is normal weight.  HENT:     Head: Normocephalic and atraumatic.     Mouth/Throat:     Mouth: Mucous membranes are moist.     Pharynx: Oropharynx is clear. No oropharyngeal exudate or posterior oropharyngeal erythema.  Eyes:     General: No scleral icterus.    Extraocular Movements: Extraocular movements intact.     Conjunctiva/sclera: Conjunctivae normal.     Pupils: Pupils are equal, round, and reactive to light.  Cardiovascular:     Rate and Rhythm: Normal rate and regular rhythm.     Heart sounds: Normal heart sounds. No murmur heard.    No friction rub. No gallop.  Pulmonary:     Effort: Pulmonary effort is normal.     Breath sounds: Normal breath sounds. No wheezing, rhonchi or rales.  Abdominal:     General: Bowel sounds are normal. There is no distension.     Palpations: Abdomen is soft. There is no hepatomegaly, splenomegaly or mass.     Tenderness: There is no abdominal tenderness.  Musculoskeletal:        General: Normal range of motion.     Cervical back: Normal range of motion and neck supple. No tenderness.     Right lower leg: No edema.     Left lower leg: No edema.  Lymphadenopathy:     Cervical: No cervical adenopathy.     Upper Body:     Right  upper body: No supraclavicular or axillary adenopathy.     Left upper body: No supraclavicular or axillary adenopathy.     Lower Body: No right inguinal adenopathy. No left inguinal adenopathy.  Skin:    General: Skin is warm and dry.     Coloration: Skin is not jaundiced.     Findings: No rash.  Neurological:     General: No focal deficit present.     Mental Status: He is alert and oriented to person, place, and time. Mental status is at baseline.     Comments: Persistent left facial weakness  Psychiatric:        Mood and Affect: Mood normal.        Behavior: Behavior normal.        Thought Content: Thought content normal.        Judgment: Judgment normal.    LABS:      Latest Ref Rng & Units 06/02/2023    1:08 PM 04/30/2023    9:35 AM 04/02/2023   11:24 AM  CBC  WBC 4.0 - 10.5 K/uL 6.6  7.6  5.8   Hemoglobin 13.0 - 17.0 g/dL 65.7  84.6  96.2   Hematocrit 39.0 - 52.0 % 34.7  39.6  40.3   Platelets 150 - 400 K/uL 385  315  351       Latest Ref Rng & Units 06/02/2023    1:08 PM 04/30/2023    9:35 AM 04/02/2023   11:24 AM  CMP  Glucose 70 - 99 mg/dL 952  89  80  BUN 6 - 20 mg/dL 15  17  18    Creatinine 0.61 - 1.24 mg/dL 1.30  8.65  7.84   Sodium 135 - 145 mmol/L 141  138  139   Potassium 3.5 - 5.1 mmol/L 4.3  4.2  3.9   Chloride 98 - 111 mmol/L 102  102  104   CO2 22 - 32 mmol/L 26  27  26    Calcium 8.9 - 10.3 mg/dL 9.9  9.2  9.2   Total Protein 6.5 - 8.1 g/dL 6.7  7.4  7.4   Total Bilirubin <1.2 mg/dL 0.3  0.5  0.6   Alkaline Phos 38 - 126 U/L 81  76  70   AST 15 - 41 U/L 23  18  24    ALT 0 - 44 U/L 17  16  21       No results found for: "CEA1", "CEA" / No results found for: "CEA1", "CEA" No results found for: "PSA1" No results found for: "ONG295" No results found for: "CAN125"  No results found for: "TOTALPROTELP", "ALBUMINELP", "A1GS", "A2GS", "BETS", "BETA2SER", "GAMS", "MSPIKE", "SPEI" Lab Results  Component Value Date   TIBC 382 06/02/2023   TIBC 297  05/21/2022   FERRITIN 22 (L) 06/02/2023   FERRITIN 41 05/21/2022   IRONPCTSAT 23 06/02/2023   IRONPCTSAT 18 05/21/2022   Lab Results  Component Value Date   LDH 139 05/21/2022   LDH 145 12/19/2021    STUDIES:  No results found.    HISTORY:   Past Medical History:  Diagnosis Date   Acute pulmonary embolism without acute cor pulmonale (HCC) 05/01/2022   Anemia 05/20/2022   Depression    mild   Family history of breast cancer 01/22/2021   Family history of prostate cancer 01/22/2021   Genital herpes    History of back injury 2003   Pruritic rash 02/18/2022   Skin cancer    melanoma    Past Surgical History:  Procedure Laterality Date   APPLICATION OF CRANIAL NAVIGATION N/A 01/03/2022   Procedure: APPLICATION OF CRANIAL NAVIGATION;  Surgeon: Lisbeth Renshaw, MD;  Location: [facility] OR;  Service: Neurosurgery;  Laterality: N/A;   CRANIOTOMY Left 01/03/2022   Procedure: Stereotactic Left temporal craniotomy for resection of tumor;  Surgeon: Lisbeth Renshaw, MD;  Location: [facility] OR;  Service: Neurosurgery;  Laterality: Left;   MELANOMA EXCISION WITH SENTINEL LYMPH NODE BIOPSY Right 2021   SEPTOPLASTY  05/13/2023   Dr Marcheta Grammes   SPINAL FUSION W/ LUQUE UNIT ROD  2003    Family History  Problem Relation Age of Onset   Prostate cancer Father        dx 34s   Breast cancer Maternal Aunt        dx after 96; bilateral mastectomy   Stomach cancer Maternal Grandfather        dx 9s   Breast cancer Cousin 63       maternal male cousin; mets    Social History:  reports that he quit smoking about 26 years ago. His smoking use included cigarettes. He has never used smokeless tobacco. He reports current alcohol use of about 14.0 standard drinks of alcohol per week. He reports that he does not currently use drugs.The patient is alone today.  Allergies:  Allergies  Allergen Reactions   Sulfa Antibiotics Rash   Testosterone Itching    Gel    Current Medications: Current Outpatient  Medications  Medication Sig Dispense Refill   amoxicillin (AMOXIL) 500 MG capsule Take 500  mg by mouth 2 (two) times daily.     acebutolol (SECTRAL) 200 MG capsule Take 1 capsule (200 mg total) by mouth daily. 90 capsule 3   acyclovir (ZOVIRAX) 400 MG tablet Take 400 mg by mouth 3 (three) times daily.     amitriptyline (ELAVIL) 25 MG tablet Take 50 mg by mouth at bedtime.     ergocalciferol (VITAMIN D2) 1.25 MG (50000 UT) capsule Take 50,000 Units by mouth every Wednesday.     gabapentin (NEURONTIN) 300 MG capsule Take 4 capsules (1,200 mg total) by mouth 3 (three) times daily. 360 capsule 5   HYDROcodone-acetaminophen (NORCO/VICODIN) 5-325 MG tablet Take 1 tablet by mouth every 4 (four) hours as needed for moderate pain (pain score 4-6). 150 tablet 0   hydrOXYzine (ATARAX) 25 MG tablet TAKE ONE TABLET BY MOUTH 3 TIMES A DAY AS NEEDED FOR ITCHING 60 tablet 3   lidocaine-prilocaine (EMLA) cream Apply 1 Application topically as needed. 30 g 0   pentoxifylline (TRENTAL) 400 MG CR tablet Take 1 tablet (400 mg total) by mouth in the morning and at bedtime. 60 tablet 3   vitamin E 180 MG (400 UNITS) capsule Take 1 capsule (400 Units total) by mouth daily. 30 capsule 2   XARELTO 20 MG TABS tablet TAKE ONE TABLET BY MOUTH EVERY DAY WITH SUPPER. START AFTER 3 WEEKS OF 15MG 2 TIMES A DAY 30 tablet 3   No current facility-administered medications for this visit.    I,Jasmine M Lassiter,acting as a Neurosurgeon for [employer], PA-C.,have documented all relevant documentation on the behalf of Adah Perl, PA-C,as directed by  Adah Perl, PA-C while in the presence of [employer], PA-C.

## 2023-06-01 ENCOUNTER — Other Ambulatory Visit: Payer: Self-pay | Admitting: Internal Medicine

## 2023-06-01 ENCOUNTER — Encounter: Payer: Self-pay | Admitting: Oncology

## 2023-06-02 ENCOUNTER — Inpatient Hospital Stay: Payer: Medicare Other

## 2023-06-02 ENCOUNTER — Telehealth: Payer: Self-pay | Admitting: Hematology and Oncology

## 2023-06-02 ENCOUNTER — Inpatient Hospital Stay: Payer: Medicare Other | Attending: Hematology and Oncology | Admitting: Hematology and Oncology

## 2023-06-02 ENCOUNTER — Encounter: Payer: Self-pay | Admitting: Hematology and Oncology

## 2023-06-02 VITALS — BP 138/81 | HR 91 | Temp 98.2°F | Resp 18 | Ht 69.0 in | Wt 176.9 lb

## 2023-06-02 DIAGNOSIS — Z79620 Long term (current) use of immunosuppressive biologic: Secondary | ICD-10-CM | POA: Insufficient documentation

## 2023-06-02 DIAGNOSIS — D630 Anemia in neoplastic disease: Secondary | ICD-10-CM

## 2023-06-02 DIAGNOSIS — C7931 Secondary malignant neoplasm of brain: Secondary | ICD-10-CM | POA: Insufficient documentation

## 2023-06-02 DIAGNOSIS — C779 Secondary and unspecified malignant neoplasm of lymph node, unspecified: Secondary | ICD-10-CM | POA: Diagnosis not present

## 2023-06-02 DIAGNOSIS — C787 Secondary malignant neoplasm of liver and intrahepatic bile duct: Secondary | ICD-10-CM | POA: Diagnosis not present

## 2023-06-02 DIAGNOSIS — Z5112 Encounter for antineoplastic immunotherapy: Secondary | ICD-10-CM | POA: Diagnosis not present

## 2023-06-02 DIAGNOSIS — C439 Malignant melanoma of skin, unspecified: Secondary | ICD-10-CM

## 2023-06-02 DIAGNOSIS — M79604 Pain in right leg: Secondary | ICD-10-CM

## 2023-06-02 DIAGNOSIS — C4361 Malignant melanoma of right upper limb, including shoulder: Secondary | ICD-10-CM | POA: Diagnosis not present

## 2023-06-02 DIAGNOSIS — C7802 Secondary malignant neoplasm of left lung: Secondary | ICD-10-CM | POA: Insufficient documentation

## 2023-06-02 DIAGNOSIS — C7801 Secondary malignant neoplasm of right lung: Secondary | ICD-10-CM | POA: Insufficient documentation

## 2023-06-02 DIAGNOSIS — M5126 Other intervertebral disc displacement, lumbar region: Secondary | ICD-10-CM

## 2023-06-02 DIAGNOSIS — Z79899 Other long term (current) drug therapy: Secondary | ICD-10-CM | POA: Insufficient documentation

## 2023-06-02 DIAGNOSIS — D649 Anemia, unspecified: Secondary | ICD-10-CM | POA: Diagnosis not present

## 2023-06-02 DIAGNOSIS — G629 Polyneuropathy, unspecified: Secondary | ICD-10-CM

## 2023-06-02 DIAGNOSIS — C7800 Secondary malignant neoplasm of unspecified lung: Secondary | ICD-10-CM

## 2023-06-02 DIAGNOSIS — G5791 Unspecified mononeuropathy of right lower limb: Secondary | ICD-10-CM

## 2023-06-02 LAB — CMP (CANCER CENTER ONLY)
ALT: 17 U/L (ref 0–44)
AST: 23 U/L (ref 15–41)
Albumin: 4.3 g/dL (ref 3.5–5.0)
Alkaline Phosphatase: 81 U/L (ref 38–126)
Anion gap: 12 (ref 5–15)
BUN: 15 mg/dL (ref 6–20)
CO2: 26 mmol/L (ref 22–32)
Calcium: 9.9 mg/dL (ref 8.9–10.3)
Chloride: 102 mmol/L (ref 98–111)
Creatinine: 0.94 mg/dL (ref 0.61–1.24)
GFR, Estimated: >60 mL/min (ref >60)
Glucose, Bld: 150 mg/dL — ABNORMAL HIGH (ref 70–99)
Potassium: 4.3 mmol/L (ref 3.5–5.1)
Sodium: 141 mmol/L (ref 135–145)
Total Bilirubin: 0.3 mg/dL (ref <1.2)
Total Protein: 6.7 g/dL (ref 6.5–8.1)

## 2023-06-02 LAB — CBC WITH DIFFERENTIAL (CANCER CENTER ONLY)
Abs Immature Granulocytes: 0.01 10*3/uL (ref 0.00–0.07)
Basophils Absolute: 0.1 10*3/uL (ref 0.0–0.1)
Basophils Relative: 1 %
Eosinophils Absolute: 0.2 10*3/uL (ref 0.0–0.5)
Eosinophils Relative: 3 %
HCT: 34.7 % — ABNORMAL LOW (ref 39.0–52.0)
Hemoglobin: 11.8 g/dL — ABNORMAL LOW (ref 13.0–17.0)
Immature Granulocytes: 0 %
Lymphocytes Relative: 22 %
Lymphs Abs: 1.5 10*3/uL (ref 0.7–4.0)
MCH: 31 pg (ref 26.0–34.0)
MCHC: 34 g/dL (ref 30.0–36.0)
MCV: 91.1 fL (ref 80.0–100.0)
Monocytes Absolute: 0.6 10*3/uL (ref 0.1–1.0)
Monocytes Relative: 10 %
Neutro Abs: 4.2 10*3/uL (ref 1.7–7.7)
Neutrophils Relative %: 64 %
Platelet Count: 385 10*3/uL (ref 150–400)
RBC: 3.81 MIL/uL — ABNORMAL LOW (ref 4.22–5.81)
RDW: 13.6 % (ref 11.5–15.5)
WBC Count: 6.6 10*3/uL (ref 4.0–10.5)
nRBC: 0 % (ref 0.0–0.2)
nRBC: 0 /100{WBCs}

## 2023-06-02 LAB — FOLATE: Folate: 6 ng/mL (ref >5.9)

## 2023-06-02 LAB — IRON AND TIBC
Iron: 86 ug/dL (ref 45–182)
Saturation Ratios: 23 % (ref 17.9–39.5)
TIBC: 382 ug/dL (ref 250–450)
UIBC: 296 ug/dL

## 2023-06-02 LAB — FERRITIN: Ferritin: 22 ng/mL — ABNORMAL LOW (ref 24–336)

## 2023-06-02 LAB — VITAMIN B12: Vitamin B-12: 419 pg/mL (ref 180–914)

## 2023-06-02 LAB — TSH: TSH: 1.5 u[IU]/mL (ref 0.350–4.500)

## 2023-06-02 MED ORDER — HEPARIN SOD (PORK) LOCK FLUSH 100 UNIT/ML IV SOLN
500.0000 [IU] | Freq: Once | INTRAVENOUS | Status: AC | PRN
  Administered 2023-06-02: 500 [IU]

## 2023-06-02 MED ORDER — HYDROCODONE-ACETAMINOPHEN 5-325 MG PO TABS: 1.0000 | ORAL_TABLET | ORAL | 0 refills | Status: DC | PRN

## 2023-06-02 MED ORDER — SODIUM CHLORIDE 0.9% FLUSH
10.0000 mL | INTRAVENOUS | Status: DC | PRN
  Administered 2023-06-02 (×2): 10 mL

## 2023-06-02 MED ORDER — GABAPENTIN 300 MG PO CAPS: 1200.0000 mg | ORAL_CAPSULE | Freq: Three times a day (TID) | ORAL | 5 refills | Status: DC

## 2023-06-02 MED ORDER — SODIUM CHLORIDE 0.9 % IV SOLN: Freq: Once | INTRAVENOUS | Status: AC

## 2023-06-02 MED ORDER — SODIUM CHLORIDE 0.9 % IV SOLN
480.0000 mg | Freq: Once | INTRAVENOUS | Status: AC
  Administered 2023-06-02: 480 mg via INTRAVENOUS
  Filled 2023-06-02: qty 48

## 2023-06-02 NOTE — Telephone Encounter (Signed)
06/02/23 Next appt scheduled and confirmed with patient

## 2023-06-02 NOTE — Patient Instructions (Signed)
Nivolumab Injection What is this medication? NIVOLUMAB (nye VOL ue mab) treats some types of cancer. It works by helping your immune system slow or stop the spread of cancer cells. It is a monoclonal antibody. This medicine may be used for other purposes; ask your health care provider or pharmacist if you have questions. COMMON BRAND NAME(S): Opdivo What should I tell my care team before I take this medication? They need to know if you have any of these conditions: Allogeneic stem cell transplant (uses someone else's stem cells) Autoimmune diseases, such as Crohn disease, ulcerative colitis, lupus History of chest radiation Nervous system problems, such as Guillain-Barre syndrome or myasthenia gravis Organ transplant An unusual or allergic reaction to nivolumab, other medications, foods, dyes, or preservatives Pregnant or trying to get pregnant Breast-feeding How should I use this medication? This medication is infused into a vein. It is given in a hospital or clinic setting. A special MedGuide will be given to you before each treatment. Be sure to read this information carefully each time. Talk to your care team about the use of this medication in children. While it may be prescribed for children as young as 12 years for selected conditions, precautions do apply. Overdosage: If you think you have taken too much of this medicine contact a poison control center or emergency room at once. NOTE: This medicine is only for you. Do not share this medicine with others. What if I miss a dose? Keep appointments for follow-up doses. It is important not to miss your dose. Call your care team if you are unable to keep an appointment. What may interact with this medication? Interactions have not been studied. This list may not describe all possible interactions. Give your health care provider a list of all the medicines, herbs, non-prescription drugs, or dietary supplements you use. Also tell them if you  smoke, drink alcohol, or use illegal drugs. Some items may interact with your medicine. What should I watch for while using this medication? Your condition will be monitored carefully while you are receiving this medication. You may need blood work while taking this medication. This medication may cause serious skin reactions. They can happen weeks to months after starting the medication. Contact your care team right away if you notice fevers or flu-like symptoms with a rash. The rash may be red or purple and then turn into blisters or peeling of the skin. You may also notice a red rash with swelling of the face, lips, or lymph nodes in your neck or under your arms. Tell your care team right away if you have any change in your eyesight. Talk to your care team if you are pregnant or think you might be pregnant. A negative pregnancy test is required before starting this medication. A reliable form of contraception is recommended while taking this medication and for 5 months after the last dose. Talk to your care team about effective forms of contraception. Do not breast-feed while taking this medication and for 5 months after the last dose. What side effects may I notice from receiving this medication? Side effects that you should report to your care team as soon as possible: Allergic reactions--skin rash, itching, hives, swelling of the face, lips, tongue, or throat Dry cough, shortness of breath or trouble breathing Eye pain, redness, irritation, or discharge with blurry or decreased vision Heart muscle inflammation--unusual weakness or fatigue, shortness of breath, chest pain, fast or irregular heartbeat, dizziness, swelling of the ankles, feet, or hands Hormone   gland problems--headache, sensitivity to light, unusual weakness or fatigue, dizziness, fast or irregular heartbeat, increased sensitivity to cold or heat, excessive sweating, constipation, hair loss, increased thirst or amount of urine,  tremors or shaking, irritability Infusion reactions--chest pain, shortness of breath or trouble breathing, feeling faint or lightheaded Kidney injury (glomerulonephritis)--decrease in the amount of urine, red or dark brown urine, foamy or bubbly urine, swelling of the ankles, hands, or feet Liver injury--right upper belly pain, loss of appetite, nausea, light-colored stool, dark yellow or brown urine, yellowing skin or eyes, unusual weakness or fatigue Pain, tingling, or numbness in the hands or feet, muscle weakness, change in vision, confusion or trouble speaking, loss of balance or coordination, trouble walking, seizures Rash, fever, and swollen lymph nodes Redness, blistering, peeling, or loosening of the skin, including inside the mouth Sudden or severe stomach pain, bloody diarrhea, fever, nausea, vomiting Side effects that usually do not require medical attention (report these to your care team if they continue or are bothersome): Bone, joint, or muscle pain Diarrhea Fatigue Loss of appetite Nausea Skin rash This list may not describe all possible side effects. Call your doctor for medical advice about side effects. You may report side effects to FDA at 1-800-FDA-1088. Where should I keep my medication? This medication is given in a hospital or clinic. It will not be stored at home. NOTE: This sheet is a summary. It may not cover all possible information. If you have questions about this medicine, talk to your doctor, pharmacist, or health care provider.  2024 [employer] Standard (2021-10-21 00:00:00)  

## 2023-06-02 NOTE — Assessment & Plan Note (Signed)
Recurrent, slowly worsening anemia.  His ferritin is decreased.  I will have him start a multivitamin with iron.  Will continue to monitor his blood counts.

## 2023-06-03 ENCOUNTER — Telehealth: Payer: Self-pay

## 2023-06-03 ENCOUNTER — Other Ambulatory Visit: Payer: Self-pay

## 2023-06-03 LAB — HAPTOGLOBIN: Haptoglobin: 235 mg/dL (ref 29–370)

## 2023-06-03 LAB — T4: T4, Total: 6.2 ug/dL (ref 4.5–12.0)

## 2023-06-03 NOTE — Telephone Encounter (Signed)
Patient notified and voiced understanding.

## 2023-06-03 NOTE — Telephone Encounter (Signed)
-----   Message from Adah Perl sent at 06/02/2023  8:42 PM EST ----- Please let him know his iron stores are low. I would like him to take a multivitamin with iron-can ask the pharmacist. Thanks

## 2023-06-17 ENCOUNTER — Other Ambulatory Visit: Payer: Self-pay

## 2023-06-18 ENCOUNTER — Encounter: Payer: Self-pay | Admitting: Oncology

## 2023-06-18 ENCOUNTER — Ambulatory Visit
Admission: RE | Admit: 2023-06-18 | Discharge: 2023-06-18 | Disposition: A | Discharge status: Home / Self Care | Payer: Medicare Other | Source: Ambulatory Visit | Attending: Internal Medicine | Admitting: Internal Medicine

## 2023-06-18 DIAGNOSIS — C7931 Secondary malignant neoplasm of brain: Secondary | ICD-10-CM

## 2023-06-18 DIAGNOSIS — C801 Malignant (primary) neoplasm, unspecified: Secondary | ICD-10-CM | POA: Diagnosis not present

## 2023-06-18 MED ORDER — GADOPICLENOL 0.5 MMOL/ML IV SOLN
8.0000 mL | Freq: Once | INTRAVENOUS | Status: AC | PRN
  Administered 2023-06-18: 8 mL via INTRAVENOUS

## 2023-06-22 ENCOUNTER — Encounter: Payer: Self-pay | Admitting: Oncology

## 2023-06-22 ENCOUNTER — Inpatient Hospital Stay: Payer: Medicare Other | Attending: Internal Medicine | Admitting: Internal Medicine

## 2023-06-22 VITALS — BP 129/93 | HR 74 | Temp 97.7°F | Resp 20 | Wt 180.0 lb

## 2023-06-22 DIAGNOSIS — C4361 Malignant melanoma of right upper limb, including shoulder: Secondary | ICD-10-CM | POA: Diagnosis not present

## 2023-06-22 DIAGNOSIS — C7800 Secondary malignant neoplasm of unspecified lung: Secondary | ICD-10-CM | POA: Insufficient documentation

## 2023-06-22 DIAGNOSIS — C787 Secondary malignant neoplasm of liver and intrahepatic bile duct: Secondary | ICD-10-CM | POA: Insufficient documentation

## 2023-06-22 DIAGNOSIS — C7931 Secondary malignant neoplasm of brain: Secondary | ICD-10-CM | POA: Insufficient documentation

## 2023-06-22 DIAGNOSIS — Z808 Family history of malignant neoplasm of other organs or systems: Secondary | ICD-10-CM | POA: Diagnosis not present

## 2023-06-22 DIAGNOSIS — Z8042 Family history of malignant neoplasm of prostate: Secondary | ICD-10-CM | POA: Insufficient documentation

## 2023-06-22 DIAGNOSIS — Z87891 Personal history of nicotine dependence: Secondary | ICD-10-CM | POA: Insufficient documentation

## 2023-06-22 DIAGNOSIS — Z79899 Other long term (current) drug therapy: Secondary | ICD-10-CM | POA: Diagnosis not present

## 2023-06-22 DIAGNOSIS — Z803 Family history of malignant neoplasm of breast: Secondary | ICD-10-CM | POA: Insufficient documentation

## 2023-06-22 MED ORDER — PREGABALIN 75 MG PO CAPS: 75.0000 mg | ORAL_CAPSULE | Freq: Two times a day (BID) | ORAL | 2 refills | Status: DC

## 2023-06-22 NOTE — Progress Notes (Signed)
[facility] at [facility] [address]  [place], [place] [address] [phone]   Interval Evaluation  Date of Service: 06/22/23 Patient Name: Xavier Jones Patient MRN: [id number] Patient DOB: 18-Dec-1970 Provider: Henreitta Leber, MD  Identifying Statement:  Xavier Jones is a 52 y.o. 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  Variants of uncertain significance detected in APC (c.681C>G (p.Asp227Glu)), CDKN1C (c.610C>G (p.Pro204Ala)), and FH (c.1151C>T (p.Ala384Val)).  The report date is February 18, 2021.    The Multi-Cancer + RNA Panel offered by [facility] includes sequencing and/or deletion/duplication analysis of the following 84 genes:  AIP*, ALK, APC*, ATM*, AXIN2*, BAP1*, BARD1*, BLM*, BMPR1A*, BRCA1*, BRCA2*, BRIP1*, CASR, CDC73*, CDH1*, CDK4, CDKN1B*, CDKN1C*, CDKN2A, CEBPA, CHEK2*, CTNNA1*, DICER1*, DIS3L2*, EGFR, EPCAM, FH*, FLCN*, GATA2*, GPC3, GREM1, HOXB13, HRAS, KIT, MAX*, MEN1*, MET, MITF, MLH1*, MSH2*, MSH3*, MSH6*, MUTYH*, NBN*, NF1*, NF2*, NTHL1*, PALB2*, PDGFRA, PHOX2B, PMS2*, POLD1*, POLE*, POT1*, PRKAR1A*, PTCH1*, PTEN*, RAD50*, RAD51C*, RAD51D*, RB1*, RECQL4, RET, RUNX1*, SDHA*, SDHAF2*, SDHB*, SDHC*, SDHD*, SMAD4*, SMARCA4*, SMARCB1*, SMARCE1*, STK11*, SUFU*, TERC, TERT, TMEM127*, Tp53*, TSC1*, TSC2*, VHL*, WRN*, and WT1.  RNA analysis is performed for * genes.   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 Small nonocclusive segmental level pulmonary embolism in the  inferior LEFT upper lobe.  2. Marked bronchial wall thickening with areas of patchy basilar  opacities and new ground-glass opacities in the upper lobes.  Constellation of findings may be infectious bronchiolitis/bronchitis  though the possibility of drug related changes in the setting of  immunotherapy are also considered. Furthermore, given basilar  predominance and some material in basilar bronchial structures would  correlate with any risk factors for or history of aspiration.  3. Response to therapy of bilateral metastatic lesions. Some lymph  nodes in the chest with interval increase in size are equivocal at  this time, potentially reactive, but warrant attention on subsequent  imaging.    07/22/2022 Imaging   CT chest, abdomen and pelvis:  IMPRESSION:  1.  Interval positive response to therapy. Resolved subcarinal and  bilateral hilar lymphadenopathy. Stable to decreased left pulmonary  nodules. No new or progressive metastatic disease in the chest,  abdomen or pelvis.  2. One  vessel coronary atherosclerosis.  3. Aortic Atherosclerosis (ICD10-I70.0).    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 Interval positive response to therapy. Resolved subcarinal and  bilateral hilar lymphadenopathy. Stable to decreased left pulmonary  nodules. No new or progressive metastatic disease in the chest,  abdomen or pelvis.  2. One vessel coronary atherosclerosis.  3. Aortic Atherosclerosis (ICD10-I70.0).     CNS Oncologic History 01/02/22: SRS to 4 metastases, including pre-op L temporal Kathrynn Running) 01/03/22: Craniotomy, resection L temporal Conchita Paris)  Interval History: Xavier Jones presents for follow up after recent MRI brain.  His left sided facial weakness is unchanged for the most part, continues to have some fasciculations asp rior.  He remains unable to close his eyelid.  He is using saline drops to keep the eye moist.  No longer dosing decadron.  Neuropathic pain in his right leg is progressive and more disabling, although he continue to deny numbness, weakness.  Dosing gabapentin 1200mg  TID with poor efficacy.  Continues on nivolumab with Dr. Gilman Buttner, most recently was infused last week.  H+P (03/31/22) Patient presents today after 3 month post-SRS and craniotomy follow up.  He developed left sided weakness, mainly facial droop, 1 week ago.  Denies arm or leg weakness during that time.  He was started on decadron, dosed 4mg  TID x3 days, then down to 4mg  daily thereafter.  His weakness has improved back to near baseline.  He has no other complaints today, no seizures, no issues with  gait.  Medications: Current Outpatient Medications on File Prior to Visit  Medication Sig Dispense Refill   acebutolol (SECTRAL) 200 MG capsule Take 1 capsule (200 mg total) by mouth daily. 90 capsule 3   acyclovir (ZOVIRAX) 400 MG tablet Take 400 mg by mouth 3 (three) times daily.     amitriptyline (ELAVIL) 25 MG tablet Take 50 mg by mouth at bedtime.     amoxicillin (AMOXIL) 500 MG capsule Take 500 mg by mouth 2 (two) times daily.     ergocalciferol (VITAMIN D2) 1.25 MG (50000 UT) capsule Take 50,000 Units by mouth every Wednesday.     gabapentin (NEURONTIN) 300 MG capsule Take 4 capsules (1,200 mg total) by mouth 3 (three) times daily. 360 capsule 5   HYDROcodone-acetaminophen (NORCO/VICODIN) 5-325 MG tablet Take 1 tablet by mouth every 4 (four) hours as needed for moderate pain (pain score 4-6). 150 tablet 0   hydrOXYzine (ATARAX) 25 MG tablet TAKE ONE TABLET BY MOUTH 3 TIMES A DAY AS NEEDED FOR ITCHING 60 tablet 3   lidocaine-prilocaine (EMLA) cream Apply 1 Application topically as needed. 30 g 0   pentoxifylline (TRENTAL) 400 MG CR tablet Take 1 tablet (400 mg total) by mouth in the morning and at bedtime. 60 tablet 3   vitamin E 180 MG (400 UNITS) capsule Take 1 capsule (400 Units total) by mouth daily. 30 capsule 2  XARELTO 20 MG TABS tablet TAKE ONE TABLET BY MOUTH EVERY DAY WITH SUPPER. START AFTER 3 WEEKS OF 15MG 2 TIMES A DAY 30 tablet 3   No current facility-administered medications on file prior to visit.    Allergies:  Allergies  Allergen Reactions   Sulfa Antibiotics Rash   Testosterone Itching    Gel   Past Medical History:  Past Medical History:  Diagnosis Date   Acute pulmonary embolism without acute cor pulmonale (HCC) 05/01/2022   Anemia 05/20/2022   Depression    mild   Family history of breast cancer 01/22/2021   Family history of prostate cancer 01/22/2021   Genital herpes    History of back injury 2003   Pruritic rash 02/18/2022   Skin cancer     melanoma   Past Surgical History:  Past Surgical History:  Procedure Laterality Date   APPLICATION OF CRANIAL NAVIGATION N/A 01/03/2022   Procedure: APPLICATION OF CRANIAL NAVIGATION;  Surgeon: Lisbeth Renshaw, MD;  Location: [facility] OR;  Service: Neurosurgery;  Laterality: N/A;   CRANIOTOMY Left 01/03/2022   Procedure: Stereotactic Left temporal craniotomy for resection of tumor;  Surgeon: Lisbeth Renshaw, MD;  Location: [facility] OR;  Service: Neurosurgery;  Laterality: Left;   MELANOMA EXCISION WITH SENTINEL LYMPH NODE BIOPSY Right 2021   SEPTOPLASTY  05/13/2023   Dr Marcheta Grammes   SPINAL FUSION W/ LUQUE UNIT ROD  2003   Social History:  Social History   Socioeconomic History   Marital status: Divorced    Spouse name: Not on file   Number of children: 0   Years of education: Not on file   Highest education level: Not on file  Occupational History   Not on file  Tobacco Use   Smoking status: Former    Current packs/day: 0.00    Types: Cigarettes    Quit date: 1998    Years since quitting: 26.9   Smokeless tobacco: Never  Vaping Use   Vaping status: Never Used  Substance and Sexual Activity   Alcohol use: Yes    Alcohol/week: 14.0 standard drinks of alcohol    Types: 14 Cans of beer per week    Comment: 2-3 beers per evening   Drug use: Not Currently   Sexual activity: Not on file  Other Topics Concern   Not on file  Social History Narrative   Not on file   Social Drivers of Health   Financial Resource Strain: Not on file  Food Insecurity: Not on file  Transportation Needs: Not on file  Physical Activity: Not on file  Stress: Not on file  Social Connections: Not on file  Intimate Partner Violence: Not on file   Family History:  Family History  Problem Relation Age of Onset   Prostate cancer Father        dx 49s   Breast cancer Maternal Aunt        dx after 59; bilateral mastectomy   Stomach cancer Maternal Grandfather        dx 30s   Breast cancer Cousin 79        maternal male cousin; mets    Review of Systems: Constitutional: Doesn't report fevers, chills or abnormal weight loss Eyes: Doesn't report blurriness of vision Ears, nose, mouth, throat, and face: Doesn't report sore throat Respiratory: Doesn't report cough, dyspnea or wheezes Cardiovascular: Doesn't report palpitation, chest discomfort  Gastrointestinal:  Doesn't report nausea, constipation, diarrhea GU: Doesn't report incontinence Skin: Doesn't report skin rashes Neurological: Per HPI Musculoskeletal: Doesn't  report joint pain Behavioral/Psych: Doesn't report anxiety  Physical Exam: Vitals:   06/22/23 1104  BP: (!) 129/93  Pulse: 74  Resp: 20  Temp: 97.7 F (36.5 C)  SpO2: 97%     KPS: 80. General: Alert, cooperative, pleasant, in no acute distress Head: Normal EENT: No conjunctival injection or scleral icterus.  Lungs: Resp effort normal Cardiac: Regular rate Abdomen: Non-distended abdomen Skin: No rashes cyanosis or petechiae. Extremities: No clubbing or edema  Neurologic Exam: Mental Status: Awake, alert, attentive to examiner. Oriented to self and environment. Language is fluent with intact comprehension.  Cranial Nerves: Visual acuity is grossly normal. Visual fields are full. Extra-ocular movements intact. L LMN facial paresis, dense Motor: Tone and bulk are normal. Power is full in both arms and legs. Reflexes are symmetric, no pathologic reflexes present.  Sensory: Intact to light touch Gait: Normal.   Labs: I have reviewed the data as listed    Component Value Date/Time   NA 141 06/02/2023 1308   NA 143 01/05/2023 0000   K 4.3 06/02/2023 1308   CL 102 06/02/2023 1308   CO2 26 06/02/2023 1308   GLUCOSE 150 (H) 06/02/2023 1308   BUN 15 06/02/2023 1308   BUN 9 01/05/2023 0000   CREATININE 0.94 06/02/2023 1308   CALCIUM 9.9 06/02/2023 1308   PROT 6.7 06/02/2023 1308   PROT 6.2 (A) 05/08/2022 0000   ALBUMIN 4.3 06/02/2023 1308   AST 23  06/02/2023 1308   ALT 17 06/02/2023 1308   ALKPHOS 81 06/02/2023 1308   BILITOT 0.3 06/02/2023 1308   GFRNONAA >60 06/02/2023 1308   Lab Results  Component Value Date   WBC 6.6 06/02/2023   NEUTROABS 4.2 06/02/2023   HGB 11.8 (L) 06/02/2023   HCT 34.7 (L) 06/02/2023   MCV 91.1 06/02/2023   PLT 385 06/02/2023    Imaging:  MR BRAIN W WO CONTRAST Result Date: 06/18/2023 CLINICAL DATA:  CNS neoplasm, assess treatment response EXAM: MRI HEAD WITHOUT AND WITH CONTRAST TECHNIQUE: Multiplanar, multiecho pulse sequences of the brain and surrounding structures were obtained without and with intravenous contrast. CONTRAST:  8 cc of UA intravenous COMPARISON:  02/19/2023 FINDINGS: Brain: The punctate focus of enhancement in the superior right frontal lobe is resolved. Inferior and anterior right frontal lobe metastasis with mild vasogenic edema is unchanged, enhancing area measuring 12 mm. Superficial left temporal lobe resection site with wispy scar-like enhancement that is stable. 13 mm lesion at the left brachium pontis is unchanged by measurement. No significant edema. Mild T2 hyperintensity in the left lower cerebellum with punctate enhancement on 14:28 is stable No acute infarct, acute hemorrhage, hydrocephalus, or collection. Vascular: Normal flow voids and vascular enhancements. Skull and upper cervical spine: Normal marrow signal Sinuses/Orbits: No significant or interval finding IMPRESSION: Punctate right frontal lobe enhancement highlighted on prior is resolved and no new lesion is seen. Other pre-existing lesions remain stable, as does mild vasogenic edema the inferior right frontal site. Electronically Signed   By: Tiburcio Pea M.D.   On: 06/18/2023 12:02    [facility] Clinician Interpretation: I have personally reviewed the radiological images as listed.  My interpretation, in the context of the patient's clinical presentation, is stable disease    Assessment/Plan Malignant melanoma  metastatic to brain [facility])  Xavier Jones is clinically stable today from CNS standpoint, with ongoing dense left LMN facial paresis.  MRI brain demonstrates stable findings, ongoing contraction of enhancing volume.  Right leg radiculopathy or plexopathy is progressive  and leading to functional impairment, high doses of neuropathic pain and opiate medications.  Recommended re-imaging the lumbar spine, MRI with contrast.  Lyrica trial can initiate at 75mg  BID in place of gabapentin due to poor efficacy and tolerance.  He is agreeable with this.  We appreciate the opportunity to participate in the care of Xavier Jones.   We will touch base with him via phone in 3-4 weeks to assess response to Lyrica, review lumbar spine MRI.  We also ask that Xavier Jones return to clinic in 4 months following next brain MRI, or sooner as needed.  He will continue to follow with Dr. Gilman Buttner for nivolumab  All questions were answered. The patient knows to call the clinic with any problems, questions or concerns. No barriers to learning were detected.  The total time spent in the encounter was 40 minutes and more than 50% was on counseling and review of test results   Henreitta Leber, MD Medical Director of Neuro-Oncology [name] 06/22/23 10:49 AM

## 2023-06-23 ENCOUNTER — Other Ambulatory Visit: Payer: Self-pay

## 2023-06-25 ENCOUNTER — Other Ambulatory Visit: Payer: Self-pay

## 2023-07-02 NOTE — Progress Notes (Addendum)
[facility]  [address] [place],  [place]  [address] [phone]  Clinic Day:  07/03/23  Referring physician: [name], NP  ASSESSMENT & PLAN:   Assessment & Plan: Melanoma of skin (HCC) Stage IIB malignant melanoma of the right posterior shoulder, Breslow's depth 3.3 mm, Clark level IV, treated with wide local excision, diagnosed in May 2021.  There was still a question as to whether this represented a metastatic lesion, rather than a primary, based on the presence of 2 nodules and no epidermal involvement.  MRI head and CT chest, abdomen and pelvis in June were negative for metastatic disease.   No adjuvant therapy was recommended.  PET scan in September was negative for metastatic disease at that time.  By June 2023, he was found to have widespread metastatic disease including to the brain.  CT scans in January 2024 revealed resolved subcarinal and bilateral hilar lymphadenopathy. Stable to decreased left pulmonary nodules. No new or progressive metastatic disease in the chest, abdomen or pelvis. Most recent CT scans in July 2024 stable. We will repeat imaging in July, 2025 unless there is a need to do so sooner.    Metastasis to subcutaneous tissue  He presented with a new area of concern to the left mid back in June 2023.  A superficial nodule was noted approximately 1 cm to the right of his upper thoracic spine,and it had increased in size. He was sent for biopsy which revealed metastatic malignant melanoma.   He has several hypermetabolic subcutaneous nodules seen on PET scan which have resolved with treatment.  Metastasis to liver He had a new small hypermetabolic liver lesion in June 5284.  This was less than 1 cm, but suspicious for metastasis.  This is no longer present on repeat imaging.  Metastasis to lymph nodes He had multiple hypermetabolic nodes of the retroperitoneal and right pelvic areas, as well as right external iliac and a few additional  nodes. These have resolved.  Pulmonary metastases He had bilateral hypermetabolic pulmonary nodules which increased in size by the time treatment was started.  The largest nodule is up to 2.3 cm in diameter. CT in January 2024 revealed a good response.  Most recent CT chest in July shows 2 tiny 4 mm nodules, which are persistent and unchanged. There are no other suspicious or new nodules.  Metastases to brain This was suspicious on the PET scan in the left frontoparietal lobe.  MRI of the brain confirmed a 2.6 cm heterogeneous lesion of the left superior temporal gyrus with mild enhancement and extensive surrounding edema.  He also has an 8 mm cortical lesion of the inferomedial right frontal lobe with mild edema, a 2 mm mildly enhancing cortical lesion of the inferolateral left frontal lobe and a intrinsically T1 hyperintense lesion of the left lateral pons measuring 7 mm with surrounding edema.  He has 4 lesions in total, but 3 are less than 1 cm.  He was treated with surgical resection of the larger lesion and stereotactic radiation.   He had recurrent neurologic symptoms with left-sided facial droop and left-sided weakness in September 2023 and was placed on dexamethasone 4 mg 3 times daily.  Brain revealed thin linear enhancement along the resection margin of the left temporal resection cavity, which may be postoperative, with additional small areas of somewhat more nodular enhancement, which are nonspecific, but could indicate recurrent disease. Attention on follow-up. Interval increase in the size of the right inferior frontal gyrus  enhancing lesion with increased surrounding edema, mild local mass effect, and mild local midline shift. Stable to slightly decreased size of the left pontine lesion with decreased associated edema. Previously noted left inferior frontal gyrus lesion is no longer visualized. No definite new lesion.  Dr. Barbaraann Cao and his neurologic symptoms have improved, so he was slowly  weaned off dexamethasone.  MRI brain in December 2023 was stable.  Continued attention on follow-up was recommended.  He has had persistent left facial weakness.  MRI brain in March revealed increased size of a hemorrhagic left pons/middle cerebellar peduncle lesion with increased surrounding edema, similar versus slightly decreased size of a 13 x 10 mm enhancing lesion in the medial right frontal lobe, and mildly diminished intrinsic T1 hyperintensity of the left temporal resection site with linear enhancement along the lateral margin, probably postsurgical/post radiation.  PET brain in April did not show hyperactivity, but more radiation necrosis, so these changes are more consistent with posttreatment changes.  MRI head in August revealed new punctate superior right frontal lobe enhancement compatible with interval metastasis, interval regression of enhancement and edema in the left brachium pontis and pons. There was also a vasogenic edema at a pre-existing right frontal metastasis with underlying lesion enhancement remaining stable.  Dr. Barbaraann Cao recommended continued observation and saw him in December 2024 with a repeat MRI brain.  This looks quite good and the punctate lesion seen on the last scan has resolved.  We see no new lesions and overall the scan looks good, so Dr. Barbaraann Cao will repeated in 4 months this time.   Family history of breast cancer Maternal family history of breast cancer in addition to his melanoma history which raises suspicion for possible BRCA mutation.  He met with the genetic counselors and [facility] RNA + hereditary cancer panel revealed variants of uncertain significance (VUS) of the APC, CDKN1C and FH genes.   Severe pain right lower extremity This is felt to possibly represent neuropathy from his immunotherapy.  MRI lumbar spine and July revealed a broad shallow left foraminal disc protrusion at L4 - L5 with a small anular fissure. No right foraminal stenosis. Mild left  foraminal stenosis and no acute osseous injury of the lumbar spine.  We referred him back to  [name] who did his brain surgery.  He has persistent right leg pain despite gabapentin.  Xrays and MRI do not show clear explanation for it.  We have titrated up to gabapentin.  He is currently on 900 mg 3 times a day.  He is using hydrocodone 4 daily and rates the pain as 7-8.  He saw [name] and had a MRI thoracic spine.  Increasing the gabapentin 300 mg to 4 tablets TID did not help so Dr. Barbaraann Cao has switched him to Lyrica at 75 mg twice daily and it has helped much better.  It does seem to wear off soon so we may want to increase the dose to 3 times daily..  Anemia Recurrent, slowly worsening anemia, with a hemoglobin down to 11.3 today.  His ferritin is decreased.  He started a multivitamin as well as iron daily.  His folate level nearly normal at 6.0 with normal being over 5.9.  I will therefore also place him on folate 1 mg daily.  We will continue to monitor his blood counts.  Hypoglycemia His blood sugar was slightly low at 68 today on a random draw so I did discuss the symptoms of hypoglycemia and the measures to take if  this occurs.  Bilateral upper extremity pain He describes pain of both elbows which is fairly severe and radiates down to the wrist, right greater than left.    I went back and reviewed a CT of the neck from February 11 done at [facility] when he had a fractured left clavicle.  This does show significant degenerative changes of the cervical spine, especially at C5 and C6, but multiple levels with foraminal stenosis.  I discussed this with him and do not think is likely to be amenable to surgery.  We may wish to consider physical therapy.    Plan: He has persistent lower back pain with radiation down the right leg, as well as a burning sensation of the right foot when mild swelling.  This is predominantly neuropathy from his immunotherapy, but he also has significant  degenerative disc disease of his entire spine, especially cervical and lumbar.  He was taking gabapentin 900 mg TID with little benefit and about 5 hydrocodone 5-325mg  tablets.  Dr. Barbaraann Cao has changed him to Lyrica at 75 mg twice daily and this is helping much better.  He does notice that it seems to wear off and so we will consider increase to 3 times daily dosing.  For now we will give it more time since he is only been on it for 2 weeks.  He complains of pain of both elbows which radiates down to his wrist, worse on the right.  I went back and reviewed a CT of the neck from February 11 done at [facility] when he had a fractured left clavicle.  This does show significant degenerative changes of the cervical spine, especially at C5 and C6, but multiple levels with foraminal stenosis.  I discussed this with him and do not think is likely to be amenable to surgery.  We may wish to consider physical therapy.  His anemia is a little worse with a hemoglobin down to 11.3 and he has been on iron for 1 month after a low ferritin of 22.  His folate level was normal at 6.0 but this is just barely so I will place him also on folate 1 mg daily.  I will refill his hydrocodone as it has been 1 month since his last refill, he is still using 3 to 4-day.  His appetite is good.  His weight has decreased 1 pound over the last month. He continues to work.  He saw Dr. Barbaraann Cao with MRI brain in December.  This looks quite good and will be repeated again in 4 months.  However he is bringing I of the lumbosacral spine in January to his persistent radicular type pain. His blood sugar was slightly low at 68 today on a random draw so I did discuss the symptoms of hypoglycemia and the measures to take if this occurs.  He may benefit from a physical therapy referral for his neck.  We will plan to see him back in 4 weeks with a CBC and comprehensive metabolic panel prior to his next nivolumab.  The patient understands the plans discussed  today and is in agreement with them.  He knows to contact our office if he develops concerns prior to his next appointment.   60 minutes was spent in patient care.  This included time spent preparing to see the patient (e.g., review of tests), obtaining and/or reviewing separately obtained history, counseling and educating the patient/family/caregiver, ordering medications, tests, or procedures; documenting clinical information in the electronic or other health  record, independently interpreting results and communicating results to the patient/family/caregiver as well as coordination of care.   Dellia Beckwith, MD [facility] - A DEPT OF [facility] [address] [place] [address] Dept: [phone] Dept Fax: [phone]   No orders of the defined types were placed in this encounter.   CHIEF COMPLAINT:  CC: Metastatic melanoma   Current Treatment:  Brain surgery, irradiation, followed by immunotherapy   HISTORY OF PRESENT ILLNESS:  Xavier Jones is a 52 y.o. male with stage IIB (T3b N0 M0) malignant melanoma of the right posterior shoulder diagnosed in May 2021. He reported an enlarging nevus of the right shoulder for about a year.  It began to bleed, so he saw his dermatologist.  Biopsy revealed malignant melanoma, Breslow's depth 3.3 mm, Clark level IV, however, there was no overt attachment to the overlying epidermis, so a metastatic nodule could not be ruled out.  He had several other lesions removed at the time of biopsy.  Right lower lateral back shave revealed a dysplastic compound nevus with severe atypia, limited margins free.  Right lower medial back shave revealed dysplastic nevus with moderate to severe atypia, peripheral margin involved.  Left mid buttocks shave revealed dysplastic compound nevus with moderate atypia, limited margins free.  MRI head and CT chest, abdomen and pelvis in June were negative for  metastatic disease. He underwent wide local excision of the right shoulder melanoma with Dr. Vinson Moselle in June.  Pathology revealed residual dermal malignant melanoma, but margins were clear.  3 sentinel lymph nodes were negative for metastasis (0/3).  However, there was a question as to whether this represents a metastatic lesion, rather than a primary, based on the presence of 2 nodules and no epidermal involvement.  He also had excision of the lower back lesion, which did not reveal malignancy. PET scan in September 2021 did not reveal any findings of hypermetabolic metastatic disease, there was low level activity felt to be postoperative changes in the skin/subcutaneous tissue of the right posterior shoulder, as well as likely degenerative hypermetabolism of the proximal, posterior right femur.  No adjuvant therapy was recommended.  Due to his personal and family history of malignancy, he was scheduled for a consultation with the genetic counselor to discuss testing for hereditary cancer syndromes. [facility] testing from August 2022 revealed variants of uncertain significance (VUS) of the APC, CDKN1C and FH gene.  Unfortunately, he developed metastatic disease in June 2023.  He had palpable subcutaneous nodules.  CT chest revealed 4 subcutaneous nodules in the back worrisome for metastatic disease. At least 20 pulmonary nodules are identified, involving all lobes of both lungs consistent with metastatic disease.  Biopsy of a subcutaneous nodule was positive for metastatic melanoma.  MRI head revealed four metastatic lesions as described with the largest within the  left temporal lobe.  Patient oncology and recommended for stereotactic radiosurgery.  He was referred to [name] and later underwent craniotomy with resection of the left temporal lobe mass.  He was then placed on palliative immunotherapy.  He received 4 cycles of nivolumab/ipilimumab July to October 2023.  He had recurrent neurologic  symptoms in September 2023 and MRI showed possible recurrence and need craniotomy and.  He was based on dexamethasone briefly.  He was also found to have a pulmonary embolism when he developed cough in September 2023 which was treated with apixaban.  On maintenance nivolumab in November 2023.  On  repeat imaging he has shown to have a response with resolution of lymphadenopathy and a decrease in the pulmonary nodules and subcutaneous metastases.    Oncology History  Melanoma of skin (HCC)  12/01/2019 Initial Diagnosis   Melanoma of skin (HCC)   12/05/2019 Cancer Staging   Staging form: Melanoma of the Skin, AJCC 8th Edition - Clinical stage from 12/05/2019: Stage IIA (cT3a, cN0, cM0) - Signed by Dellia Beckwith, MD on 05/30/2020   02/18/2021 Genetic Testing   No pathogenic variants detected in [facility] Multi-Cancer +RNA Panel.  Variants of uncertain significance detected in APC (c.681C>G (p.Asp227Glu)), CDKN1C (c.610C>G (p.Pro204Ala)), and FH (c.1151C>T (p.Ala384Val)).  The report date is February 18, 2021.    The Multi-Cancer + RNA Panel offered by [facility] includes sequencing and/or deletion/duplication analysis of the following 84 genes:  AIP*, ALK, APC*, ATM*, AXIN2*, BAP1*, BARD1*, BLM*, BMPR1A*, BRCA1*, BRCA2*, BRIP1*, CASR, CDC73*, CDH1*, CDK4, CDKN1B*, CDKN1C*, CDKN2A, CEBPA, CHEK2*, CTNNA1*, DICER1*, DIS3L2*, EGFR, EPCAM, FH*, FLCN*, GATA2*, GPC3, GREM1, HOXB13, HRAS, KIT, MAX*, MEN1*, MET, MITF, MLH1*, MSH2*, MSH3*, MSH6*, MUTYH*, NBN*, NF1*, NF2*, NTHL1*, PALB2*, PDGFRA, PHOX2B, PMS2*, POLD1*, POLE*, POT1*, PRKAR1A*, PTCH1*, PTEN*, RAD50*, RAD51C*, RAD51D*, RB1*, RECQL4, RET, RUNX1*, SDHA*, SDHAF2*, SDHB*, SDHC*, SDHD*, SMAD4*, SMARCA4*, SMARCB1*, SMARCE1*, STK11*, SUFU*, TERC, TERT, TMEM127*, Tp53*, TSC1*, TSC2*, VHL*, WRN*, and WT1.  RNA analysis is performed for * genes.   01/30/2022 - 02/20/2022 Chemotherapy   Patient is on Treatment Plan : MELANOMA Nivolumab + Ipilimumab (1/3) q21d /  Nivolumab q28d     01/30/2022 -  Chemotherapy   Patient is on Treatment Plan : MELANOMA Nivolumab (480) q28d     Malignant melanoma metastatic to brain (HCC)  12/19/2021 Initial Diagnosis   Malignant melanoma metastatic to brain (HCC)   01/30/2022 - 02/20/2022 Chemotherapy   Patient is on Treatment Plan : MELANOMA Nivolumab + Ipilimumab (1/3) q21d / Nivolumab q28d     01/30/2022 -  Chemotherapy   Patient is on Treatment Plan : MELANOMA Nivolumab (480) q28d     Malignant melanoma metastatic to lung (HCC)  12/19/2021 Initial Diagnosis   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 Small nonocclusive segmental level pulmonary embolism in the  inferior LEFT upper lobe.  2. Marked bronchial wall thickening with areas of patchy basilar  opacities and new ground-glass opacities in the upper lobes.  Constellation of findings may be infectious bronchiolitis/bronchitis  though the possibility of drug related changes in the setting of  immunotherapy are also considered. Furthermore, given basilar  predominance and some material in basilar bronchial structures would  correlate with any risk factors for or history of aspiration.  3. Response to therapy of bilateral metastatic lesions. Some lymph  nodes in the chest with interval increase in size are equivocal at  this time, potentially reactive, but warrant attention on subsequent  imaging.    07/22/2022 Imaging   CT chest, abdomen and pelvis:  IMPRESSION:  1. Interval positive response to therapy. Resolved subcarinal and  bilateral hilar lymphadenopathy. Stable to decreased left pulmonary  nodules. No new or progressive metastatic disease in the chest,  abdomen or  pelvis.  2. One vessel coronary atherosclerosis.  3. Aortic Atherosclerosis (  ICD10-I70.0).    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 Interval positive response to therapy. Resolved subcarinal and  bilateral hilar lymphadenopathy. Stable to decreased left pulmonary  nodules. No new or progressive metastatic disease in the chest,  abdomen or pelvis.  2. One vessel coronary atherosclerosis.  3. Aortic Atherosclerosis (ICD10-I70.0).      INTERVAL HISTORY:  Choya is here today for repeat clinical assessment. He complains fatigue.  He has persistent  left facial weakness.  He reports persistent lower back pain with radiation down the right leg, as well as a burning sensation of the right foot when mild swelling. He was taking gabapentin 900 mg TID with little benefit and about 5 hydrocodone 5-325mg  tablets.  Dr. Barbaraann Cao has changed him to Lyrica at 75 mg twice daily and this is helping much better.  He does notice that it seems to wear off and so we will consider increase to 3 times daily dosing.  For now we will give it more time since he is only been on it for 2 weeks.  He complains of pain of both elbows which radiates down to his wrist, worse on the right.  I went back and reviewed a CT of the neck from February 11 done at [facility] when he had a fractured left clavicle.  This does show significant degenerative changes of the cervical spine, especially at C5 and C6, but multiple levels with foraminal stenosis.  I discussed this with him and do not think is likely to be amenable to surgery.  We may wish to consider  physical therapy.  His anemia is a little worse with a hemoglobin down to 11.3 and he has been on iron for 1 month after a low ferritin of 22.  His folate level was normal at 6.0 but this is just barely so I will place him also on folate 1 mg daily.  I will refill his hydrocodone as it has been 1 month since his last refill, he is still using 3 to 4-day.  His appetite is good.  His weight has decreased 1 pound over the last month. He continues to work.  He saw Dr. Barbaraann Cao with MRI brain in December.  This looks quite good and will be repeated again in 4 months.  However he is bringing I of the lumbosacral spine in January to his persistent radicular type pain.  He denies fevers or chills.  He denies urinary or cardiorespiratory symptoms.  His blood sugar was slightly low at 68 today on a random draw so I did discuss the symptoms of hypoglycemia and the measures to take if this occurs.  REVIEW OF SYSTEMS:  Review of Systems  Constitutional:  Positive for fatigue. Negative for appetite change, chills, diaphoresis, fever and unexpected weight change.  HENT:  Negative.  Negative for lump/mass, mouth sores and sore throat.   Eyes:  Positive for eye problems (dryness due to difficulty closing).  Respiratory: Negative.  Negative for cough and shortness of breath.   Cardiovascular: Negative.  Negative for chest pain and leg swelling.  Gastrointestinal:  Positive for diarrhea (4 diarrheal stools a day). Negative for abdominal pain, constipation, nausea and vomiting.  Endocrine: Negative.   Genitourinary: Negative.  Negative for bladder incontinence, difficulty urinating, dysuria, frequency and hematuria.   Musculoskeletal:  Positive for back pain (chronic lower back pain radiating down right leg), gait problem and neck pain. Negative for arthralgias and myalgias.       He describes bilateral upper extremity pain originating at the elbows and radiating down to the wrist, right greater than left  Skin:  Negative  for itching, rash and wound.  Neurological:  Positive for gait problem. Negative for dizziness, extremity weakness, headaches, light-headedness and numbness.       Burning sensation and mild swelling of the right foot  Hematological: Negative.  Negative for adenopathy.  Psychiatric/Behavioral: Negative.  Negative for depression and sleep disturbance. The patient is not nervous/anxious.      VITALS:  There were no vitals taken for this visit.  Wt Readings from Last 3 Encounters:  06/22/23 180 lb (81.6 kg)  06/02/23 176 lb 14.4 oz (80.2 kg)  04/30/23 174 lb 3.2 oz (79 kg)    There is no height or weight on file to calculate BMI.  Performance status (ECOG): 1 - Symptomatic but completely ambulatory  PHYSICAL EXAM:  Physical Exam Vitals and nursing note reviewed.  Constitutional:      General: He is not in acute distress.    Appearance: Normal appearance. He is normal weight.  HENT:     Head: Normocephalic and atraumatic.     Mouth/Throat:     Mouth: Mucous membranes are moist.     Pharynx: Oropharynx is clear. No oropharyngeal exudate or posterior oropharyngeal erythema.  Eyes:     General: No scleral icterus.    Extraocular Movements: Extraocular movements intact.     Conjunctiva/sclera: Conjunctivae normal.     Pupils: Pupils are equal, round, and reactive to light.  Neck:     Comments: He has firm prominence of the left anterior clavicle where he had a fracture earlier this year  He has a large well-healed incision of the right posterior neck at the site of his original melanoma. Cardiovascular:     Rate and Rhythm: Normal rate and regular rhythm.     Heart sounds: Normal heart sounds. No murmur heard.    No friction rub. No gallop.  Pulmonary:     Effort: Pulmonary effort is normal.     Breath sounds: Normal breath sounds. No wheezing, rhonchi or rales.  Abdominal:     General: Bowel sounds are normal. There is no distension.     Palpations: Abdomen is soft. There is  no hepatomegaly, splenomegaly or mass.     Tenderness: There is no abdominal tenderness.  Musculoskeletal:        General: Normal range of motion.     Cervical back: Normal range of motion and neck supple. No tenderness.     Right lower leg: No edema.     Left lower leg: No edema.  Lymphadenopathy:     Cervical: No cervical adenopathy.     Upper Body:     Right upper body: No supraclavicular or axillary adenopathy.     Left upper body: No supraclavicular or axillary adenopathy.     Lower Body: No right inguinal adenopathy. No left inguinal adenopathy.  Skin:    General: Skin is warm and dry.     Coloration: Skin is not jaundiced.     Findings: No rash.  Neurological:     General: No focal deficit present.     Mental Status: He is alert and oriented to person, place, and time. Mental status is at baseline.     Comments: Persistent left facial weakness  Psychiatric:        Mood and Affect: Mood normal.        Behavior: Behavior normal.        Thought Content: Thought content normal.        Judgment: Judgment normal.   LABS:      Latest Ref Rng & Units 06/02/2023    1:08 PM 04/30/2023    9:35 AM 04/02/2023   11:24 AM  CBC  WBC 4.0 - 10.5 K/uL 6.6  7.6  5.8   Hemoglobin 13.0 - 17.0 g/dL 16.1  09.6  04.5   Hematocrit 39.0 - 52.0 % 34.7  39.6  40.3   Platelets 150 - 400 K/uL 385  315  351       Latest Ref Rng & Units 06/02/2023    1:08 PM 04/30/2023    9:35 AM 04/02/2023   11:24 AM  CMP  Glucose 70 - 99 mg/dL 409  89  80   BUN 6 - 20 mg/dL 15  17  18    Creatinine 0.61 - 1.24 mg/dL 8.11  9.14  7.82   Sodium 135 - 145 mmol/L 141  138  139   Potassium 3.5 - 5.1 mmol/L 4.3  4.2  3.9   Chloride 98 - 111 mmol/L 102  102  104   CO2 22 - 32 mmol/L 26  27  26    Calcium 8.9 - 10.3 mg/dL 9.9  9.2  9.2   Total Protein 6.5 - 8.1 g/dL 6.7  7.4  7.4   Total Bilirubin <1.2 mg/dL 0.3  0.5  0.6   Alkaline Phos 38 - 126 U/L 81  76  70   AST 15 - 41 U/L 23  18  24    ALT 0 - 44 U/L 17   16  21       No results found for: "CEA1", "CEA" / No results found for: "CEA1", "CEA" No results found for: "PSA1" No results found for: "NFA213" No results found for: "CAN125"  No results found for: "TOTALPROTELP", "ALBUMINELP", "A1GS", "A2GS", "BETS", "BETA2SER", "GAMS", "MSPIKE", "SPEI" Lab Results  Component Value Date   TIBC 382 06/02/2023   TIBC 297 05/21/2022   FERRITIN 22 (L) 06/02/2023   FERRITIN 41 05/21/2022   IRONPCTSAT 23 06/02/2023   IRONPCTSAT 18 05/21/2022   Lab Results  Component Value Date   LDH 139 05/21/2022   LDH 145 12/19/2021    STUDIES:  MR BRAIN W WO CONTRAST Result Date: 06/18/2023 CLINICAL DATA:  CNS neoplasm, assess treatment response EXAM: MRI HEAD WITHOUT AND WITH CONTRAST TECHNIQUE: Multiplanar, multiecho pulse sequences of the brain and surrounding structures were obtained without and with intravenous contrast. CONTRAST:  8 cc of UA intravenous COMPARISON:  02/19/2023 FINDINGS: Brain: The punctate focus of enhancement in the superior right frontal lobe is resolved. Inferior and anterior right frontal lobe metastasis with mild vasogenic edema is unchanged, enhancing area measuring 12 mm. Superficial left temporal lobe resection site with wispy scar-like enhancement that is stable. 13 mm lesion at the left brachium pontis is unchanged by measurement. No significant edema. Mild T2 hyperintensity in the left lower cerebellum with punctate enhancement on 14:28 is stable No acute infarct, acute hemorrhage, hydrocephalus, or collection. Vascular: Normal flow voids and vascular  enhancements. Skull and upper cervical spine: Normal marrow signal Sinuses/Orbits: No significant or interval finding IMPRESSION: Punctate right frontal lobe enhancement highlighted on prior is resolved and no new lesion is seen. Other pre-existing lesions remain stable, as does mild vasogenic edema the inferior right frontal site. Electronically Signed   By: Tiburcio Pea M.D.   On:  06/18/2023 12:02      HISTORY:   Past Medical History:  Diagnosis Date   Acute pulmonary embolism without acute cor pulmonale (HCC) 05/01/2022   Anemia 05/20/2022   Depression    mild   Family history of breast cancer 01/22/2021   Family history of prostate cancer 01/22/2021   Genital herpes    History of back injury 2003   Pruritic rash 02/18/2022   Skin cancer    melanoma    Past Surgical History:  Procedure Laterality Date   APPLICATION OF CRANIAL NAVIGATION N/A 01/03/2022   Procedure: APPLICATION OF CRANIAL NAVIGATION;  Surgeon: Lisbeth Renshaw, MD;  Location: [facility] OR;  Service: Neurosurgery;  Laterality: N/A;   CRANIOTOMY Left 01/03/2022   Procedure: Stereotactic Left temporal craniotomy for resection of tumor;  Surgeon: Lisbeth Renshaw, MD;  Location: [facility] OR;  Service: Neurosurgery;  Laterality: Left;   MELANOMA EXCISION WITH SENTINEL LYMPH NODE BIOPSY Right 2021   SEPTOPLASTY  05/13/2023   Dr Marcheta Grammes   SPINAL FUSION W/ LUQUE UNIT ROD  2003    Family History  Problem Relation Age of Onset   Prostate cancer Father        dx 1s   Breast cancer Maternal Aunt        dx after 32; bilateral mastectomy   Stomach cancer Maternal Grandfather        dx 83s   Breast cancer Cousin 74       maternal male cousin; mets    Social History:  reports that he quit smoking about 27 years ago. His smoking use included cigarettes. He has never used smokeless tobacco. He reports current alcohol use of about 14.0 standard drinks of alcohol per week. He reports that he does not currently use drugs.The patient is alone today.  Allergies:  Allergies  Allergen Reactions   Sulfa Antibiotics Rash   Testosterone Itching    Gel    Current Medications: Current Outpatient Medications  Medication Sig Dispense Refill   acebutolol (SECTRAL) 200 MG capsule Take 1 capsule (200 mg total) by mouth daily. 90 capsule 3   acyclovir (ZOVIRAX) 400 MG tablet Take 400 mg by mouth 3 (three) times  daily.     amitriptyline (ELAVIL) 25 MG tablet Take 50 mg by mouth at bedtime.     ergocalciferol (VITAMIN D2) 1.25 MG (50000 UT) capsule Take 50,000 Units by mouth every Wednesday.     HYDROcodone-acetaminophen (NORCO/VICODIN) 5-325 MG tablet Take 1 tablet by mouth every 4 (four) hours as needed for moderate pain (pain score 4-6). 150 tablet 0   hydrOXYzine (ATARAX) 25 MG tablet TAKE ONE TABLET BY MOUTH 3 TIMES A DAY AS NEEDED FOR ITCHING 60 tablet 3   lidocaine-prilocaine (EMLA) cream Apply 1 Application topically as needed. 30 g 0   pentoxifylline (TRENTAL) 400 MG CR tablet Take 1 tablet (400 mg total) by mouth in the morning and at bedtime. 60 tablet 3   pregabalin (LYRICA) 75 MG capsule Take 1 capsule (75 mg total) by mouth 2 (two) times daily. 60 capsule 2   vitamin E 180 MG (400 UNITS) capsule Take 1 capsule (400 Units total) by  mouth daily. 30 capsule 2   XARELTO 20 MG TABS tablet TAKE ONE TABLET BY MOUTH EVERY DAY WITH SUPPER. START AFTER 3 WEEKS OF 15MG 2 TIMES A DAY 30 tablet 3   No current facility-administered medications for this visit.    I,Jasmine M Lassiter,acting as a scribe for Dellia Beckwith, MD.,have documented all relevant documentation on the behalf of Dellia Beckwith, MD,as directed by  Dellia Beckwith, MD while in the presence of Dellia Beckwith, MD.

## 2023-07-03 ENCOUNTER — Inpatient Hospital Stay: Payer: Medicare Other

## 2023-07-03 ENCOUNTER — Inpatient Hospital Stay: Payer: Medicare Other | Attending: Hematology and Oncology | Admitting: Oncology

## 2023-07-03 ENCOUNTER — Encounter: Payer: Self-pay | Admitting: Oncology

## 2023-07-03 ENCOUNTER — Other Ambulatory Visit: Payer: Self-pay | Admitting: Oncology

## 2023-07-03 VITALS — BP 141/95 | HR 78 | Temp 98.9°F | Resp 16 | Ht 69.0 in | Wt 179.0 lb

## 2023-07-03 DIAGNOSIS — C7802 Secondary malignant neoplasm of left lung: Secondary | ICD-10-CM | POA: Diagnosis not present

## 2023-07-03 DIAGNOSIS — C4361 Malignant melanoma of right upper limb, including shoulder: Secondary | ICD-10-CM | POA: Insufficient documentation

## 2023-07-03 DIAGNOSIS — M79604 Pain in right leg: Secondary | ICD-10-CM

## 2023-07-03 DIAGNOSIS — C7931 Secondary malignant neoplasm of brain: Secondary | ICD-10-CM | POA: Insufficient documentation

## 2023-07-03 DIAGNOSIS — C439 Malignant melanoma of skin, unspecified: Secondary | ICD-10-CM

## 2023-07-03 DIAGNOSIS — C7801 Secondary malignant neoplasm of right lung: Secondary | ICD-10-CM | POA: Diagnosis not present

## 2023-07-03 DIAGNOSIS — G629 Polyneuropathy, unspecified: Secondary | ICD-10-CM

## 2023-07-03 DIAGNOSIS — Z5112 Encounter for antineoplastic immunotherapy: Secondary | ICD-10-CM | POA: Insufficient documentation

## 2023-07-03 DIAGNOSIS — C779 Secondary and unspecified malignant neoplasm of lymph node, unspecified: Secondary | ICD-10-CM | POA: Diagnosis not present

## 2023-07-03 DIAGNOSIS — Z79620 Long term (current) use of immunosuppressive biologic: Secondary | ICD-10-CM | POA: Insufficient documentation

## 2023-07-03 DIAGNOSIS — C7800 Secondary malignant neoplasm of unspecified lung: Secondary | ICD-10-CM

## 2023-07-03 DIAGNOSIS — C778 Secondary and unspecified malignant neoplasm of lymph nodes of multiple regions: Secondary | ICD-10-CM

## 2023-07-03 DIAGNOSIS — D529 Folate deficiency anemia, unspecified: Secondary | ICD-10-CM

## 2023-07-03 DIAGNOSIS — C787 Secondary malignant neoplasm of liver and intrahepatic bile duct: Secondary | ICD-10-CM

## 2023-07-03 DIAGNOSIS — M5126 Other intervertebral disc displacement, lumbar region: Secondary | ICD-10-CM

## 2023-07-03 LAB — CBC WITH DIFFERENTIAL (CANCER CENTER ONLY)
Abs Immature Granulocytes: 0.02 10*3/uL (ref 0.00–0.07)
Basophils Absolute: 0.1 10*3/uL (ref 0.0–0.1)
Basophils Relative: 1 %
Eosinophils Absolute: 0.2 10*3/uL (ref 0.0–0.5)
Eosinophils Relative: 3 %
HCT: 34.2 % — ABNORMAL LOW (ref 39.0–52.0)
Hemoglobin: 11.3 g/dL — ABNORMAL LOW (ref 13.0–17.0)
Immature Granulocytes: 0 %
Lymphocytes Relative: 17 %
Lymphs Abs: 1.2 10*3/uL (ref 0.7–4.0)
MCH: 31 pg (ref 26.0–34.0)
MCHC: 33 g/dL (ref 30.0–36.0)
MCV: 93.7 fL (ref 80.0–100.0)
Monocytes Absolute: 0.9 10*3/uL (ref 0.1–1.0)
Monocytes Relative: 13 %
Neutro Abs: 4.6 10*3/uL (ref 1.7–7.7)
Neutrophils Relative %: 66 %
Platelet Count: 283 10*3/uL (ref 150–400)
RBC: 3.65 MIL/uL — ABNORMAL LOW (ref 4.22–5.81)
RDW: 13.5 % (ref 11.5–15.5)
WBC Count: 6.9 10*3/uL (ref 4.0–10.5)
nRBC: 0 % (ref 0.0–0.2)
nRBC: 0 /100{WBCs}

## 2023-07-03 LAB — CMP (CANCER CENTER ONLY)
ALT: 11 U/L (ref 0–44)
AST: 16 U/L (ref 15–41)
Albumin: 3.9 g/dL (ref 3.5–5.0)
Alkaline Phosphatase: 92 U/L (ref 38–126)
Anion gap: 9 (ref 5–15)
BUN: 12 mg/dL (ref 6–20)
CO2: 28 mmol/L (ref 22–32)
Calcium: 9.3 mg/dL (ref 8.9–10.3)
Chloride: 108 mmol/L (ref 98–111)
Creatinine: 0.91 mg/dL (ref 0.61–1.24)
GFR, Estimated: >60 mL/min (ref >60)
Glucose, Bld: 68 mg/dL — ABNORMAL LOW (ref 70–99)
Potassium: 4.1 mmol/L (ref 3.5–5.1)
Sodium: 145 mmol/L (ref 135–145)
Total Bilirubin: 0.4 mg/dL (ref <1.2)
Total Protein: 6.3 g/dL — ABNORMAL LOW (ref 6.5–8.1)

## 2023-07-03 LAB — TSH: TSH: 2.582 u[IU]/mL (ref 0.350–4.500)

## 2023-07-03 MED ORDER — SODIUM CHLORIDE 0.9% FLUSH
10.0000 mL | INTRAVENOUS | Status: DC | PRN
Start: 2023-07-03 — End: 2023-07-03
  Administered 2023-07-03: 10 mL

## 2023-07-03 MED ORDER — SODIUM CHLORIDE 0.9 % IV SOLN
480.0000 mg | Freq: Once | INTRAVENOUS | Status: AC
  Administered 2023-07-03: 480 mg via INTRAVENOUS
  Filled 2023-07-03: qty 48

## 2023-07-03 MED ORDER — FOLIC ACID 1 MG PO TABS: 1.0000 mg | ORAL_TABLET | Freq: Every day | ORAL | 5 refills | Status: DC

## 2023-07-03 MED ORDER — HEPARIN SOD (PORK) LOCK FLUSH 100 UNIT/ML IV SOLN
500.0000 [IU] | Freq: Once | INTRAVENOUS | Status: AC | PRN
  Administered 2023-07-03: 500 [IU]

## 2023-07-03 MED ORDER — SODIUM CHLORIDE 0.9 % IV SOLN: Freq: Once | INTRAVENOUS | Status: AC

## 2023-07-03 MED ORDER — HYDROCODONE-ACETAMINOPHEN 5-325 MG PO TABS: 1.0000 | ORAL_TABLET | ORAL | 0 refills | Status: DC | PRN

## 2023-07-03 NOTE — Patient Instructions (Signed)
[facility] [place] - [facility] DEPT [facility]  Discharge Instructions: Thank you for choosing [facility] to provide your oncology and hematology care.  If you have a lab appointment with the Cancer Center, please go directly to the Cancer Center and check in at the registration area.   Wear comfortable clothing and clothing appropriate for easy access to any Portacath or PICC line.   We strive to give you quality time with your provider. You may need to reschedule your appointment if you arrive late (15 or more minutes).  Arriving late affects you and other patients whose appointments are after yours.  Also, if you miss three or more appointments without notifying the office, you may be dismissed from the clinic at the provider's discretion.      For prescription refill requests, have your pharmacy contact our office and allow 72 hours for refills to be completed.    Today you received the following chemotherapy and/or immunotherapy agents Nivolumab      To help prevent nausea and vomiting after your treatment, we encourage you to take your nausea medication as directed.  BELOW ARE SYMPTOMS THAT SHOULD BE REPORTED IMMEDIATELY: *FEVER GREATER THAN 100.4 F (38 C) OR HIGHER *CHILLS OR SWEATING *NAUSEA AND VOMITING THAT IS NOT CONTROLLED WITH YOUR NAUSEA MEDICATION *UNUSUAL SHORTNESS OF BREATH *UNUSUAL BRUISING OR BLEEDING *URINARY PROBLEMS (pain or burning when urinating, or frequent urination) *BOWEL PROBLEMS (unusual diarrhea, constipation, pain near the anus) TENDERNESS IN MOUTH AND THROAT WITH OR WITHOUT PRESENCE OF ULCERS (sore throat, sores in mouth, or a toothache) UNUSUAL RASH, SWELLING OR PAIN  UNUSUAL VAGINAL DISCHARGE OR ITCHING   Items with * indicate a potential emergency and should be followed up as soon as possible or go to the Emergency Department if any problems should occur.  Please show the CHEMOTHERAPY ALERT CARD or IMMUNOTHERAPY ALERT  CARD at check-in to the Emergency Department and triage nurse.  Should you have questions after your visit or need to cancel or reschedule your appointment, please contact [facility] [place] - [facility]  Dept: [phone]  and follow the prompts.  Office hours are 8:00 a.m. to 4:30 p.m. Monday - Friday. Please note that voicemails left after 4:00 p.m. may not be returned until the following business day.  We are closed weekends and major holidays. You have access to a nurse at all times for urgent questions. Please call the main number to the clinic Dept: [phone] and follow the prompts.  For any non-urgent questions, you may also contact your provider using MyChart. We now offer e-Visits for anyone 25 and older to request care online for non-urgent symptoms. For details visit [email or web address].   Also download the MyChart app! Go to the app store, search "MyChart", open the app, select [place], and log in with your MyChart username and password.

## 2023-07-04 LAB — T4: T4, Total: 6.7 ug/dL (ref 4.5–12.0)

## 2023-07-06 ENCOUNTER — Ambulatory Visit (HOSPITAL_COMMUNITY)
Admission: RE | Admit: 2023-07-06 | Discharge: 2023-07-06 | Disposition: A | Discharge status: Home / Self Care | Payer: Medicare Other | Source: Ambulatory Visit | Attending: Internal Medicine | Admitting: Internal Medicine

## 2023-07-06 DIAGNOSIS — M48061 Spinal stenosis, lumbar region without neurogenic claudication: Secondary | ICD-10-CM | POA: Diagnosis not present

## 2023-07-06 DIAGNOSIS — C7931 Secondary malignant neoplasm of brain: Secondary | ICD-10-CM | POA: Diagnosis not present

## 2023-07-06 DIAGNOSIS — G96191 Perineural cyst: Secondary | ICD-10-CM | POA: Diagnosis not present

## 2023-07-06 DIAGNOSIS — M5126 Other intervertebral disc displacement, lumbar region: Secondary | ICD-10-CM | POA: Diagnosis not present

## 2023-07-06 MED ORDER — GADOBUTROL 1 MMOL/ML IV SOLN
7.0000 mL | Freq: Once | INTRAVENOUS | Status: AC | PRN
  Administered 2023-07-06: 7 mL via INTRAVENOUS

## 2023-07-13 ENCOUNTER — Telehealth: Payer: Self-pay | Admitting: Internal Medicine

## 2023-07-13 ENCOUNTER — Inpatient Hospital Stay: Payer: 59 | Attending: Internal Medicine | Admitting: Internal Medicine

## 2023-07-13 DIAGNOSIS — C7931 Secondary malignant neoplasm of brain: Secondary | ICD-10-CM

## 2023-07-13 DIAGNOSIS — I6789 Other cerebrovascular disease: Secondary | ICD-10-CM

## 2023-07-13 DIAGNOSIS — Y842 Radiological procedure and radiotherapy as the cause of abnormal reaction of the patient, or of later complication, without mention of misadventure at the time of the procedure: Secondary | ICD-10-CM | POA: Diagnosis not present

## 2023-07-13 NOTE — Telephone Encounter (Signed)
 Left patient a voicemail regarding scheduled appointment times/dates left callback number for scheduling if needed

## 2023-07-13 NOTE — Progress Notes (Signed)
 I connected with Xavier Jones on 07/13/23 at  9:00 AM EST by telephone visit and verified that I am speaking with the correct person using two identifiers.  I discussed the limitations, risks, security and privacy concerns of performing an evaluation and management service by telemedicine and the availability of in-person appointments. I also discussed with the patient that there may be a patient responsible charge related to this service. The patient expressed understanding and agreed to proceed.  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  He does not report any significant side effects from Lyrica .  Continues on nivolumab  with Dr. Cornelius as prior.  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  Ok to stay at 75mg  BID dose level for Lyrica .  He will call us  if symptoms worsen or evolve further requiring dose adjustment.  Follow Up Instructions: RTC in 3 months following brain MRI as scheduled.  I discussed the assessment and treatment plan with the patient.  The patient was provided an opportunity to ask questions and all were answered.  The patient agreed with the plan and demonstrated understanding of the instructions.    The patient was advised to call back or seek an in-person evaluation if the symptoms worsen or if the condition fails to improve as anticipated.    Joni Norrod K Jona Zappone, MD   I provided 22 minutes of non face-to-face telephone visit time during this encounter, and > 50% was spent  counseling as documented under my assessment & plan.

## 2023-07-14 ENCOUNTER — Other Ambulatory Visit: Payer: Self-pay

## 2023-07-15 ENCOUNTER — Encounter: Payer: Self-pay | Admitting: Oncology

## 2023-07-24 ENCOUNTER — Encounter: Payer: Self-pay | Admitting: Oncology

## 2023-07-29 NOTE — Progress Notes (Signed)
[facility]  [address] [place],  [place]  [address]10 [phone]  Clinic Day: 07/31/2023  Referring physician: [name], NP  ASSESSMENT & PLAN:  Assessment: Melanoma of skin [facility]) Stage IIB malignant melanoma of the right posterior shoulder, Breslow's depth 3.3 mm, Clark level IV, treated with wide local excision, diagnosed in May 2021.  There was still a question as to whether this represented a metastatic lesion, rather than a primary, based on the presence of 2 nodules and no epidermal involvement.  MRI head and CT chest, abdomen and pelvis in June were negative for metastatic disease.   No adjuvant therapy was recommended.  PET scan in September was negative for metastatic disease at that time.  By June 2023, he was found to have widespread metastatic disease including to the brain.  CT scans in January 2024 revealed resolved subcarinal and bilateral hilar lymphadenopathy. Stable to decreased left pulmonary nodules. No new or progressive metastatic disease in the chest, abdomen or pelvis. Most recent CT scans in July 2024 stable. We will repeat imaging in July, 2025 unless there is a need to do so sooner.    Metastasis to subcutaneous tissue  He presented with a new area of concern to the left mid back in June 2023.  A superficial nodule was noted approximately 1 cm to the right of his upper thoracic spine,and it had increased in size. He was sent for biopsy which revealed metastatic malignant melanoma.   He has several hypermetabolic subcutaneous nodules seen on PET scan which have resolved with treatment.  Metastasis to liver He had a new small hypermetabolic liver lesion in June 1914.  This was less than 1 cm, but suspicious for metastasis.  This is no longer present on repeat imaging.  Metastasis to lymph nodes He had multiple hypermetabolic nodes of the retroperitoneal and right pelvic areas, as well as right external iliac and a few additional nodes. These  have resolved.  Pulmonary metastases He had bilateral hypermetabolic pulmonary nodules which increased in size by the time treatment was started.  The largest nodule is up to 2.3 cm in diameter. CT in January 2024 revealed a good response.  Most recent CT chest in July shows 2 tiny 4 mm nodules, which are persistent and unchanged. There are no other suspicious or new nodules.  Metastases to brain This was suspicious on the PET scan in the left frontoparietal lobe.  MRI of the brain confirmed a 2.6 cm heterogeneous lesion of the left superior temporal gyrus with mild enhancement and extensive surrounding edema.  He also has an 8 mm cortical lesion of the inferomedial right frontal lobe with mild edema, a 2 mm mildly enhancing cortical lesion of the inferolateral left frontal lobe and a intrinsically T1 hyperintense lesion of the left lateral pons measuring 7 mm with surrounding edema.  He has 4 lesions in total, but 3 are less than 1 cm.  He was treated with surgical resection of the larger lesion and stereotactic radiation.   He had recurrent neurologic symptoms with left-sided facial droop and left-sided weakness in September 2023 and was placed on dexamethasone 4 mg 3 times daily.  Brain revealed thin linear enhancement along the resection margin of the left temporal resection cavity, which may be postoperative, with additional small areas of somewhat more nodular enhancement, which are nonspecific, but could indicate recurrent disease. Attention on follow-up. Interval increase in the size of the right inferior frontal gyrus enhancing lesion with increased  surrounding edema, mild local mass effect, and mild local midline shift. Stable to slightly decreased size of the left pontine lesion with decreased associated edema. Previously noted left inferior frontal gyrus lesion is no longer visualized. No definite new lesion.  Dr. Barbaraann Cao slowly weaned him off dexamethasone.  MRI brain in December 2023 was stable.   He has had persistent left facial weakness.  MRI brain in March revealed increased size of a hemorrhagic left pons/middle cerebellar peduncle lesion with increased surrounding edema, similar versus slightly decreased size of a 13 x 10 mm enhancing lesion in the medial right frontal lobe, and mildly diminished intrinsic T1 hyperintensity of the left temporal resection site with linear enhancement along the lateral margin, probably postsurgical/post radiation.  PET brain in April did not show hyperactivity, but more radiation necrosis, so these changes are more consistent with posttreatment changes.  MRI head in August revealed new punctate superior right frontal lobe enhancement compatible with interval metastasis, interval regression of enhancement and edema in the left brachium pontis and pons. There was also a vasogenic edema at a pre-existing right frontal metastasis with underlying lesion enhancement remaining stable.  Dr. Barbaraann Cao recommended continued observation and saw him in December 2024 with a repeat MRI brain.  This looks quite good and the punctate lesion seen on the last scan has resolved.  We see no new lesions and overall the scan looks good, so Dr. Barbaraann Cao will repeat it 4 months this time.   Family history of breast cancer Maternal family history of breast cancer in addition to his melanoma history which raises suspicion for possible BRCA mutation.  He met with the genetic counselors and [facility] RNA + hereditary cancer panel revealed variants of uncertain significance (VUS) of the APC, CDKN1C and FH genes.   Severe pain right lower extremity This is felt to possibly represent neuropathy from his immunotherapy.  MRI lumbar spine and July revealed a broad shallow left foraminal disc protrusion at L4 - L5 with a small anular fissure. No right foraminal stenosis. Mild left foraminal stenosis and no acute osseous injury of the lumbar spine.  We referred him back to  [name] who did his brain  surgery.  He has persistent right leg pain despite gabapentin.  Xrays and MRI do not show clear explanation for it.  We have titrated up to gabapentin.  He is currently on 900 mg 3 times a day.  He is using hydrocodone 4 daily and rates the pain as 7-8.  He saw [name] and had a MRI thoracic spine.  Increasing the gabapentin 300 mg to 4 tablets TID did not help so Dr. Barbaraann Cao has switched him to Lyrica at 75 mg twice daily and it has helped much better.  It does seem to wear off soon so we may want to increase the dose to 3 times daily. I am checking numerous labs for other causes of neuropathy.   Anemia Recurrent, slowly worsening anemia, with a hemoglobin up to 12.5 today. He started a multivitamin as well as iron daily.  His folate level nearly normal at 6.0 with normal being over 5.9.  I will therefore also place him on folate 1 mg daily.  We will continue to monitor his blood counts.  Bilateral upper extremity pain He describes pain of both elbows which is fairly severe and radiates down to the wrist, right greater than left.    I went back and reviewed a CT of the neck from February 11 done at  [facility] when he had a fractured left clavicle.  This does show significant degenerative changes of the cervical spine, especially at C5 and C6, but multiple levels with foraminal stenosis.    Hypertension This has been persistent and not controlled with the beta blocker so I will add in a low dose of Lisinopril 10 mg daily. We will likely need to increase that dose.   Plan: He complains of a dizzy feeling, occasional hearing loss, and tinnitus that has been going on since radiation to the head. He informed me this worsens with loud noise and he works in a loud environment. He has seen a ENT about this to no avail. I informed him I believe this to be caused by permanent radiation damage to the brain. I will refill hydrocodone 5-325 mg. His BP is elevated today at 133/94 and this seems to be  chronic at his appointments. He does not check his BP at home but continues acebutolol 200 mg. I will start him on Lisinopril 10 mg daily. He had a MRI of the head done on 06/18/2023 that revealed the punctate right frontal lobe enhancement highlighted on prior is now resolved and no new lesion is no longer seen while other pre-existing lesions remain stable, as does mild vasogenic edema the inferior right frontal site. He also had a MRI of the lumbar spine that revealed no change in the appearance of the lumbar spine and minimal left foraminal narrowing at L4-5 is again seen as is postoperative change of L1 corpectomy. His day 1 cycle 15 of Nivolumab is scheduled on 07/31/2023. He has a WBC of 4.9, a low hemoglobin of 12.5 improved from 11.3, and platelet count of 251,000. His CMP is completely normal and his TSH and T4 today are pending. I will be ordering additional labs to rule out other causes of neuropathy, including B1, B6, B12, CRP, ANCA, SPEP, sed rate, folate, copper level, and hepatitis testing. I explained this to the patient. I will see him back in 4 weeks with CBC, CMP, and TSH. The patient understands the plans discussed today and is in agreement with them.  He knows to contact our office if he develops concerns prior to his next appointment.  I provided 19 minutes of face-to-face time during this this encounter and > 50% was spent counseling as documented under my assessment and plan.   Dellia Beckwith, MD [facility] - A DEPT OF [facility] [address] [place] [address] Dept: [phone] Dept Fax: [phone]   No orders of the defined types were placed in this encounter.   CHIEF COMPLAINT:  CC: Metastatic melanoma   Current Treatment:  Brain surgery, irradiation, followed by immunotherapy   HISTORY OF PRESENT ILLNESS:  Xavier Jones is a 53 y.o. male with stage IIB (T3b N0 M0) malignant melanoma of the  right posterior shoulder diagnosed in May 2021. He reported an enlarging nevus of the right shoulder for about a year.  It began to bleed, so he saw his dermatologist.  Biopsy revealed malignant melanoma, Breslow's depth 3.3 mm, Clark level IV, however, there was no overt attachment to the overlying epidermis, so a metastatic nodule could not be ruled out.  He had several other lesions removed at the time of biopsy.  Right lower lateral back shave revealed a dysplastic compound nevus with severe atypia, limited margins free.  Right lower medial back shave revealed dysplastic nevus with moderate to  severe atypia, peripheral margin involved.  Left mid buttocks shave revealed dysplastic compound nevus with moderate atypia, limited margins free.  MRI head and CT chest, abdomen and pelvis in June were negative for metastatic disease. He underwent wide local excision of the right shoulder melanoma with Dr. Vinson Moselle in June.  Pathology revealed residual dermal malignant melanoma, but margins were clear.  3 sentinel lymph nodes were negative for metastasis (0/3).  However, there was a question as to whether this represents a metastatic lesion, rather than a primary, based on the presence of 2 nodules and no epidermal involvement.  He also had excision of the lower back lesion, which did not reveal malignancy. PET scan in September 2021 did not reveal any findings of hypermetabolic metastatic disease, there was low level activity felt to be postoperative changes in the skin/subcutaneous tissue of the right posterior shoulder, as well as likely degenerative hypermetabolism of the proximal, posterior right femur.  No adjuvant therapy was recommended.  Due to his personal and family history of malignancy, he was scheduled for a consultation with the genetic counselor to discuss testing for hereditary cancer syndromes. [facility] testing from August 2022 revealed variants of uncertain significance (VUS) of the APC, CDKN1C and FH  gene.  Unfortunately, he developed metastatic disease in June 2023.  He had palpable subcutaneous nodules.  CT chest revealed 4 subcutaneous nodules in the back worrisome for metastatic disease. At least 20 pulmonary nodules are identified, involving all lobes of both lungs consistent with metastatic disease.  Biopsy of a subcutaneous nodule was positive for metastatic melanoma.  MRI head revealed four metastatic lesions as described with the largest within the  left temporal lobe.  Patient oncology and recommended for stereotactic radiosurgery.  He was referred to [name] and later underwent craniotomy with resection of the left temporal lobe mass.  He was then placed on palliative immunotherapy.  He received 4 cycles of nivolumab/ipilimumab July to October 2023.  He had recurrent neurologic symptoms in September 2023 and MRI showed possible recurrence and need craniotomy and.  He was based on dexamethasone briefly.  He was also found to have a pulmonary embolism when he developed cough in September 2023 which was treated with apixaban.  On maintenance nivolumab in November 2023.  On repeat imaging he has shown to have a response with resolution of lymphadenopathy and a decrease in the pulmonary nodules and subcutaneous metastases.    Oncology History  Melanoma of skin (HCC)  12/01/2019 Initial Diagnosis   Melanoma of skin (HCC)   12/05/2019 Cancer Staging   Staging form: Melanoma of the Skin, AJCC 8th Edition - Clinical stage from 12/05/2019: Stage IIA (cT3a, cN0, cM0) - Signed by Dellia Beckwith, MD on 05/30/2020   02/18/2021 Genetic Testing   No pathogenic variants detected in [facility] Multi-Cancer +RNA Panel.  Variants of uncertain significance detected in APC (c.681C>G (p.Asp227Glu)), CDKN1C (c.610C>G (p.Pro204Ala)), and FH (c.1151C>T (p.Ala384Val)).  The report date is February 18, 2021.    The Multi-Cancer + RNA Panel offered by [facility] includes sequencing and/or deletion/duplication  analysis of the following 84 genes:  AIP*, ALK, APC*, ATM*, AXIN2*, BAP1*, BARD1*, BLM*, BMPR1A*, BRCA1*, BRCA2*, BRIP1*, CASR, CDC73*, CDH1*, CDK4, CDKN1B*, CDKN1C*, CDKN2A, CEBPA, CHEK2*, CTNNA1*, DICER1*, DIS3L2*, EGFR, EPCAM, FH*, FLCN*, GATA2*, GPC3, GREM1, HOXB13, HRAS, KIT, MAX*, MEN1*, MET, MITF, MLH1*, MSH2*, MSH3*, MSH6*, MUTYH*, NBN*, NF1*, NF2*, NTHL1*, PALB2*, PDGFRA, PHOX2B, PMS2*, POLD1*, POLE*, POT1*, PRKAR1A*, PTCH1*, PTEN*, RAD50*, RAD51C*, RAD51D*, RB1*, RECQL4, RET, RUNX1*, SDHA*, SDHAF2*, SDHB*, SDHC*,  SDHD*, SMAD4*, SMARCA4*, SMARCB1*, SMARCE1*, STK11*, SUFU*, TERC, TERT, TMEM127*, Tp53*, TSC1*, TSC2*, VHL*, WRN*, and WT1.  RNA analysis is performed for * genes.   01/30/2022 - 02/20/2022 Chemotherapy   Patient is on Treatment Plan : MELANOMA Nivolumab + Ipilimumab (1/3) q21d / Nivolumab q28d     01/30/2022 -  Chemotherapy   Patient is on Treatment Plan : MELANOMA Nivolumab (480) q28d     Malignant melanoma metastatic to brain (HCC)  12/19/2021 Initial Diagnosis   Malignant melanoma metastatic to brain (HCC)   01/30/2022 - 02/20/2022 Chemotherapy   Patient is on Treatment Plan : MELANOMA Nivolumab + Ipilimumab (1/3) q21d / Nivolumab q28d     01/30/2022 -  Chemotherapy   Patient is on Treatment Plan : MELANOMA Nivolumab (480) q28d     Malignant melanoma metastatic to lung (HCC)  12/19/2021 Initial Diagnosis   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 Small nonocclusive segmental level pulmonary embolism in the  inferior LEFT upper lobe.  2. Marked bronchial wall thickening with areas of patchy basilar  opacities and new ground-glass opacities in the upper lobes.  Constellation of findings may be  infectious bronchiolitis/bronchitis  though the possibility of drug related changes in the setting of  immunotherapy are also considered. Furthermore, given basilar  predominance and some material in basilar bronchial structures would  correlate with any risk factors for or history of aspiration.  3. Response to therapy of bilateral metastatic lesions. Some lymph  nodes in the chest with interval increase in size are equivocal at  this time, potentially reactive, but warrant attention on subsequent  imaging.    07/22/2022 Imaging   CT chest, abdomen and pelvis:  IMPRESSION:  1. Interval positive response to therapy. Resolved subcarinal and  bilateral hilar lymphadenopathy. Stable to decreased left pulmonary  nodules. No new or progressive metastatic disease in the chest,  abdomen or pelvis.  2. One vessel coronary atherosclerosis.  3. Aortic Atherosclerosis (ICD10-I70.0).    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 Interval positive response to therapy. Resolved subcarinal and  bilateral hilar lymphadenopathy. Stable to decreased left pulmonary  nodules. No new or progressive metastatic disease in the chest,  abdomen or pelvis.  2. One vessel coronary atherosclerosis.  3. Aortic Atherosclerosis (ICD10-I70.0).      INTERVAL HISTORY:  Xavier Jones is here today for repeat clinical assessment for his metastatic melanoma. Patient states that he feels ok but complains of right foot pain rating 3/10, right arm pain rating 9/10, a dizzy feeling, occasional hearing loss and tinnitus that has been going on since radiation  to the head. He informed me this worsens with loud noise as he works in a loud environment. He has seen a ENT about this to no avail. I informed  him I believe this to be caused by permanent radiation damage to the brain. I will refill hydrocodone 5-325mg . His BP is elevated today at 133/94 and this seems to be chronic at his appointments. He does not check his BP at home but continues acebutolol 200mg . I will start him on Lisinopril 10mg  daily. He had a MRI of the head done on 06/18/2023 that revealed the punctate right frontal lobe enhancement highlighted on prior is now resolved and no new lesion is no longer seen while other pre-existing lesions remain stable, as does mild vasogenic edema the inferior right frontal site. He also had a MRI of the lumbar spine that revealed no change in the appearance of the lumbar spine and minimal left foraminal narrowing at L4-5 is again seen as is postoperative change of L1 corpectomy. His day 1 cycle 15 of Nivolumab is scheduled on 07/31/2023. He has a WBC of 4.9, a low hemoglobin of 12.5 improved from 11.3, and platelet count of 251,000. His CMP is completely normal and his TSH and T4 today are pending. I will be ordering additional labs to rule out other causes of neuropathy, including B1, B6, B12, CRP, ANCA, SPEP, sed rate, folate, copper level, and hepatitis testing. I explained this to the patient. I will see him back in 4 weeks with CBC, CMP, and TSH.     He denies signs of infection such as sore throat, sinus drainage, cough, or urinary symptoms.  He denies fevers or recurrent chills. He denies nausea, vomiting, chest pain, dyspnea or cough. His appetite is good and his weight has decreased 5 pounds over last month .  REVIEW OF SYSTEMS:  Review of Systems  Constitutional:  Positive for fatigue. Negative for appetite change, chills, diaphoresis, fever and unexpected weight change.  HENT:   Positive for hearing loss (occasional). Negative for lump/mass, mouth  sores, nosebleeds, sore throat, tinnitus, trouble swallowing and voice change.   Eyes:  Positive for eye problems (dryness due to difficulty closing). Negative for icterus.  Respiratory: Negative.  Negative for chest tightness, cough, hemoptysis, shortness of breath and wheezing.   Cardiovascular: Negative.  Negative for chest pain, leg swelling and palpitations.  Gastrointestinal:  Positive for diarrhea (4 diarrheal stools a day). Negative for abdominal distention, abdominal pain, blood in stool, constipation, nausea, rectal pain and vomiting.  Endocrine: Negative.  Negative for hot flashes.  Genitourinary:  Negative for bladder incontinence, difficulty urinating, dyspareunia, dysuria, frequency, hematuria, nocturia, pelvic pain and penile discharge.   Musculoskeletal:  Positive for back pain (chronic lower back pain radiating down right leg), gait problem and neck pain. Negative for arthralgias, flank pain, myalgias and neck stiffness.       Right foot pain rating 3/10, right arm pain rating 9/10  Skin: Negative.  Negative for itching, rash and wound.  Neurological:  Positive for dizziness (occasional) and gait problem. Negative for extremity weakness, headaches, light-headedness, numbness, seizures and speech difficulty.       Burning sensation of the right foot  Hematological: Negative.  Negative for adenopathy. Does not bruise/bleed easily.  Psychiatric/Behavioral: Negative.  Negative for confusion, decreased concentration, depression, sleep disturbance and suicidal ideas. The patient is not nervous/anxious.      VITALS:  Blood pressure (!) 133/94, pulse 72, temperature 98.3 F (36.8 C), temperature source Oral, resp. rate 16, height 5\' 9"  (1.753 m), weight 174 lb 9.6 oz (79.2 kg), SpO2 99%.  Wt Readings from Last 3 Encounters:  07/31/23 174 lb 9.6 oz (79.2 kg)  07/03/23 179 lb (81.2 kg)  06/22/23 180 lb (81.6 kg)    Body mass index is 25.78 kg/m.  Performance status (ECOG): 1 -  Symptomatic but completely ambulatory  PHYSICAL EXAM:  Physical Exam Vitals and nursing note reviewed.  Constitutional:      General: He is not in acute distress.    Appearance: Normal appearance. He is normal weight.  HENT:     Head: Normocephalic and atraumatic.     Right Ear: Tympanic membrane, ear canal and external ear normal.     Left Ear: Tympanic membrane, ear canal and external ear normal.     Mouth/Throat:     Mouth: Mucous membranes are moist.     Pharynx: Oropharynx is clear. No oropharyngeal exudate or posterior oropharyngeal erythema.  Eyes:     General: No scleral icterus.    Extraocular Movements: Extraocular movements intact.     Conjunctiva/sclera: Conjunctivae normal.     Pupils: Pupils are equal, round, and reactive to light.  Neck:     Comments: Swelling of the left sternoclavicular joint where he fractured his clavicle Cardiovascular:     Rate and Rhythm: Normal rate and regular rhythm.     Heart sounds: Normal heart sounds. No murmur heard.    No friction rub. No gallop.  Pulmonary:     Effort: Pulmonary effort is normal.     Breath sounds: Normal breath sounds. No wheezing, rhonchi or rales.  Abdominal:     General: Bowel sounds are normal. There is no distension.     Palpations: Abdomen is soft. There is no hepatomegaly, splenomegaly or mass.     Tenderness: There is no abdominal tenderness.  Musculoskeletal:        General: Normal range of motion.     Cervical back: Normal range of motion and neck supple. No tenderness.     Right lower leg: No edema.     Left lower leg: No edema.  Lymphadenopathy:     Cervical: No cervical adenopathy.     Upper Body:     Right upper body: No supraclavicular or axillary adenopathy.     Left upper body: No supraclavicular or axillary adenopathy.     Lower Body: No right inguinal adenopathy. No left inguinal adenopathy.  Skin:    General: Skin is warm and dry.     Coloration: Skin is not jaundiced.     Findings:  No rash.     Comments: Vitiligo on his skin  Neurological:     General: No focal deficit present.     Mental Status: He is alert and oriented to person, place, and time. Mental status is at baseline.     Comments: Persistent left facial weakness  Psychiatric:        Mood and Affect: Mood normal.        Behavior: Behavior normal.        Thought Content: Thought content normal.        Judgment: Judgment normal.    LABS:      Latest Ref Rng & Units 07/31/2023    9:40 AM 07/03/2023    9:27 AM 06/02/2023    1:08 PM  CBC  WBC 4.0 - 10.5 K/uL 4.9  6.9  6.6   Hemoglobin 13.0 - 17.0 g/dL 16.1  09.6  04.5   Hematocrit 39.0 - 52.0 % 37.1  34.2  34.7   Platelets 150 - 400 K/uL 251  283  385       Latest  Ref Rng & Units 07/31/2023    9:40 AM 07/03/2023    9:27 AM 06/02/2023    1:08 PM  CMP  Glucose 70 - 99 mg/dL 161  68  096   BUN 6 - 20 mg/dL 15  12  15    Creatinine 0.61 - 1.24 mg/dL 0.45  4.09  8.11   Sodium 135 - 145 mmol/L 145  145  141   Potassium 3.5 - 5.1 mmol/L 4.3  4.1  4.3   Chloride 98 - 111 mmol/L 107  108  102   CO2 22 - 32 mmol/L 28  28  26    Calcium 8.9 - 10.3 mg/dL 9.4  9.3  9.9   Total Protein 6.5 - 8.1 g/dL 6.6  6.3  6.7   Total Bilirubin 0.0 - 1.2 mg/dL 0.3  0.4  0.3   Alkaline Phos 38 - 126 U/L 84  92  81   AST 15 - 41 U/L 17  16  23    ALT 0 - 44 U/L 10  11  17     No results found for: "CEA1", "CEA" / No results found for: "CEA1", "CEA" No results found for: "PSA1" No results found for: "BJY782" No results found for: "CAN125"  No results found for: "TOTALPROTELP", "ALBUMINELP", "A1GS", "A2GS", "BETS", "BETA2SER", "GAMS", "MSPIKE", "SPEI" Lab Results  Component Value Date   TIBC 382 06/02/2023   TIBC 297 05/21/2022   FERRITIN 22 (L) 06/02/2023   FERRITIN 41 05/21/2022   IRONPCTSAT 23 06/02/2023   IRONPCTSAT 18 05/21/2022   Lab Results  Component Value Date   LDH 139 05/21/2022   LDH 145 12/19/2021   Lab Results  Component Value Date   TSH 1.402  07/31/2023   T4TOTAL 6.3 07/31/2023   Lab Results  Component Value Date   VITAMINB12 419 06/02/2023   Lab Results  Component Value Date   FOLATE 6.0 06/02/2023   STUDIES:  No results found.   EXAM: 06/18/2023 MRI HEAD WITHOUT AND WITH CONTRAST IMPRESSION: Punctate right frontal lobe enhancement highlighted on prior is resolved and no new lesion is seen. Other pre-existing lesions remain stable, as does mild vasogenic edema the inferior right frontal site.  HISTORY:   Past Medical History:  Diagnosis Date   Acute pulmonary embolism without acute cor pulmonale (HCC) 05/01/2022   Anemia 05/20/2022   Depression    mild   Family history of breast cancer 01/22/2021   Family history of prostate cancer 01/22/2021   Genital herpes    History of back injury 2003   Pruritic rash 02/18/2022   Skin cancer    melanoma    Past Surgical History:  Procedure Laterality Date   APPLICATION OF CRANIAL NAVIGATION N/A 01/03/2022   Procedure: APPLICATION OF CRANIAL NAVIGATION;  Surgeon: Lisbeth Renshaw, MD;  Location: [facility] OR;  Service: Neurosurgery;  Laterality: N/A;   CRANIOTOMY Left 01/03/2022   Procedure: Stereotactic Left temporal craniotomy for resection of tumor;  Surgeon: Lisbeth Renshaw, MD;  Location: [facility] OR;  Service: Neurosurgery;  Laterality: Left;   MELANOMA EXCISION WITH SENTINEL LYMPH NODE BIOPSY Right 2021   SEPTOPLASTY  05/13/2023   Dr Marcheta Grammes   SPINAL FUSION W/ LUQUE UNIT ROD  2003    Family History  Problem Relation Age of Onset   Prostate cancer Father        dx 29s   Breast cancer Maternal Aunt        dx after 50; bilateral mastectomy   Stomach cancer Maternal Grandfather  dx 30s   Breast cancer Cousin 69       maternal male cousin; mets    Social History:  reports that he quit smoking about 27 years ago. His smoking use included cigarettes. He has never used smokeless tobacco. He reports current alcohol use of about 14.0 standard drinks of alcohol per  week. He reports that he does not currently use drugs.The patient is alone today.  Allergies:  Allergies  Allergen Reactions   Sulfa Antibiotics Rash   Testosterone Itching    Gel    Current Medications: Current Outpatient Medications  Medication Sig Dispense Refill   acebutolol (SECTRAL) 200 MG capsule Take 1 capsule (200 mg total) by mouth daily. 90 capsule 3   acyclovir (ZOVIRAX) 400 MG tablet Take 400 mg by mouth 3 (three) times daily.     amitriptyline (ELAVIL) 25 MG tablet Take 50 mg by mouth at bedtime.     ergocalciferol (VITAMIN D2) 1.25 MG (50000 UT) capsule Take 50,000 Units by mouth every Wednesday.     folic acid (FOLVITE) 1 MG tablet Take 1 tablet (1 mg total) by mouth daily. 30 tablet 5   HYDROcodone-acetaminophen (NORCO/VICODIN) 5-325 MG tablet Take 1 tablet by mouth every 4 (four) hours as needed for moderate pain (pain score 4-6). 150 tablet 0   hydrOXYzine (ATARAX) 25 MG tablet TAKE ONE TABLET BY MOUTH 3 TIMES A DAY AS NEEDED FOR ITCHING 60 tablet 3   lidocaine-prilocaine (EMLA) cream Apply 1 Application topically as needed. 30 g 0   lisinopril (ZESTRIL) 10 MG tablet Take 1 tablet (10 mg total) by mouth daily. 30 tablet 5   pentoxifylline (TRENTAL) 400 MG CR tablet Take 1 tablet (400 mg total) by mouth in the morning and at bedtime. 60 tablet 3   pregabalin (LYRICA) 75 MG capsule Take 1 capsule (75 mg total) by mouth 2 (two) times daily. 60 capsule 2   vitamin E 180 MG (400 UNITS) capsule Take 1 capsule (400 Units total) by mouth daily. 30 capsule 2   XARELTO 20 MG TABS tablet TAKE ONE TABLET BY MOUTH EVERY DAY WITH SUPPER. START AFTER 3 WEEKS OF 15MG 2 TIMES A DAY 30 tablet 3   No current facility-administered medications for this visit.    I,Jasmine M Lassiter,acting as a scribe for Dellia Beckwith, MD.,have documented all relevant documentation on the behalf of Dellia Beckwith, MD,as directed by  Dellia Beckwith, MD while in the presence of Dellia Beckwith, MD.

## 2023-07-31 ENCOUNTER — Inpatient Hospital Stay: Payer: Medicare Other | Attending: Hematology and Oncology | Admitting: Oncology

## 2023-07-31 ENCOUNTER — Other Ambulatory Visit: Payer: Self-pay | Admitting: Oncology

## 2023-07-31 ENCOUNTER — Other Ambulatory Visit: Payer: Self-pay

## 2023-07-31 ENCOUNTER — Inpatient Hospital Stay: Payer: Medicare Other

## 2023-07-31 ENCOUNTER — Encounter: Payer: Self-pay | Admitting: Oncology

## 2023-07-31 VITALS — BP 133/94 | HR 72 | Temp 98.3°F | Resp 16 | Ht 69.0 in | Wt 174.6 lb

## 2023-07-31 DIAGNOSIS — Z5112 Encounter for antineoplastic immunotherapy: Secondary | ICD-10-CM | POA: Diagnosis not present

## 2023-07-31 DIAGNOSIS — C787 Secondary malignant neoplasm of liver and intrahepatic bile duct: Secondary | ICD-10-CM | POA: Diagnosis not present

## 2023-07-31 DIAGNOSIS — M5126 Other intervertebral disc displacement, lumbar region: Secondary | ICD-10-CM

## 2023-07-31 DIAGNOSIS — C7801 Secondary malignant neoplasm of right lung: Secondary | ICD-10-CM | POA: Insufficient documentation

## 2023-07-31 DIAGNOSIS — C439 Malignant melanoma of skin, unspecified: Secondary | ICD-10-CM

## 2023-07-31 DIAGNOSIS — Z79620 Long term (current) use of immunosuppressive biologic: Secondary | ICD-10-CM | POA: Diagnosis not present

## 2023-07-31 DIAGNOSIS — C778 Secondary and unspecified malignant neoplasm of lymph nodes of multiple regions: Secondary | ICD-10-CM | POA: Diagnosis not present

## 2023-07-31 DIAGNOSIS — C7800 Secondary malignant neoplasm of unspecified lung: Secondary | ICD-10-CM

## 2023-07-31 DIAGNOSIS — Z87891 Personal history of nicotine dependence: Secondary | ICD-10-CM | POA: Insufficient documentation

## 2023-07-31 DIAGNOSIS — C4361 Malignant melanoma of right upper limb, including shoulder: Secondary | ICD-10-CM | POA: Insufficient documentation

## 2023-07-31 DIAGNOSIS — C7802 Secondary malignant neoplasm of left lung: Secondary | ICD-10-CM | POA: Insufficient documentation

## 2023-07-31 DIAGNOSIS — G629 Polyneuropathy, unspecified: Secondary | ICD-10-CM

## 2023-07-31 DIAGNOSIS — C7931 Secondary malignant neoplasm of brain: Secondary | ICD-10-CM | POA: Insufficient documentation

## 2023-07-31 DIAGNOSIS — I10 Essential (primary) hypertension: Secondary | ICD-10-CM | POA: Insufficient documentation

## 2023-07-31 DIAGNOSIS — Z79899 Other long term (current) drug therapy: Secondary | ICD-10-CM | POA: Diagnosis not present

## 2023-07-31 DIAGNOSIS — M79604 Pain in right leg: Secondary | ICD-10-CM

## 2023-07-31 HISTORY — DX: Essential (primary) hypertension: I10

## 2023-07-31 LAB — TSH: TSH: 1.402 u[IU]/mL (ref 0.350–4.500)

## 2023-07-31 LAB — CBC WITH DIFFERENTIAL (CANCER CENTER ONLY)
Abs Immature Granulocytes: 0.01 10*3/uL (ref 0.00–0.07)
Basophils Absolute: 0.1 10*3/uL (ref 0.0–0.1)
Basophils Relative: 1 %
Eosinophils Absolute: 0.2 10*3/uL (ref 0.0–0.5)
Eosinophils Relative: 3 %
HCT: 37.1 % — ABNORMAL LOW (ref 39.0–52.0)
Hemoglobin: 12.5 g/dL — ABNORMAL LOW (ref 13.0–17.0)
Immature Granulocytes: 0 %
Lymphocytes Relative: 36 %
Lymphs Abs: 1.8 10*3/uL (ref 0.7–4.0)
MCH: 31 pg (ref 26.0–34.0)
MCHC: 33.7 g/dL (ref 30.0–36.0)
MCV: 92.1 fL (ref 80.0–100.0)
Monocytes Absolute: 0.4 10*3/uL (ref 0.1–1.0)
Monocytes Relative: 8 %
Neutro Abs: 2.5 10*3/uL (ref 1.7–7.7)
Neutrophils Relative %: 52 %
Platelet Count: 251 10*3/uL (ref 150–400)
RBC: 4.03 MIL/uL — ABNORMAL LOW (ref 4.22–5.81)
RDW: 13 % (ref 11.5–15.5)
WBC Count: 4.9 10*3/uL (ref 4.0–10.5)
nRBC: 0 % (ref 0.0–0.2)
nRBC: 0 /100{WBCs}

## 2023-07-31 LAB — CMP (CANCER CENTER ONLY)
ALT: 10 U/L (ref 0–44)
AST: 17 U/L (ref 15–41)
Albumin: 4.3 g/dL (ref 3.5–5.0)
Alkaline Phosphatase: 84 U/L (ref 38–126)
Anion gap: 10 (ref 5–15)
BUN: 15 mg/dL (ref 6–20)
CO2: 28 mmol/L (ref 22–32)
Calcium: 9.4 mg/dL (ref 8.9–10.3)
Chloride: 107 mmol/L (ref 98–111)
Creatinine: 0.87 mg/dL (ref 0.61–1.24)
GFR, Estimated: >60 mL/min (ref >60)
Glucose, Bld: 134 mg/dL — ABNORMAL HIGH (ref 70–99)
Potassium: 4.3 mmol/L (ref 3.5–5.1)
Sodium: 145 mmol/L (ref 135–145)
Total Bilirubin: 0.3 mg/dL (ref 0.0–1.2)
Total Protein: 6.6 g/dL (ref 6.5–8.1)

## 2023-07-31 MED ORDER — HEPARIN SOD (PORK) LOCK FLUSH 100 UNIT/ML IV SOLN
500.0000 [IU] | Freq: Once | INTRAVENOUS | Status: AC | PRN
  Administered 2023-07-31: 500 [IU]

## 2023-07-31 MED ORDER — HYDROCODONE-ACETAMINOPHEN 5-325 MG PO TABS: 1.0000 | ORAL_TABLET | ORAL | 0 refills | Status: DC | PRN

## 2023-07-31 MED ORDER — SODIUM CHLORIDE 0.9 % IV SOLN
480.0000 mg | Freq: Once | INTRAVENOUS | Status: AC
  Administered 2023-07-31: 480 mg via INTRAVENOUS
  Filled 2023-07-31: qty 48

## 2023-07-31 MED ORDER — SODIUM CHLORIDE 0.9 % IV SOLN: Freq: Once | INTRAVENOUS | Status: AC

## 2023-07-31 MED ORDER — SODIUM CHLORIDE 0.9% FLUSH
10.0000 mL | INTRAVENOUS | Status: DC | PRN
Start: 2023-07-31 — End: 2023-07-31
  Administered 2023-07-31: 10 mL

## 2023-07-31 MED ORDER — LISINOPRIL 10 MG PO TABS: 10.0000 mg | ORAL_TABLET | Freq: Every day | ORAL | 5 refills | Status: DC

## 2023-07-31 NOTE — Patient Instructions (Signed)
[facility] [place] - A DEPT OF [facility]  Discharge Instructions: Thank you for choosing [facility] to provide your oncology and hematology care.  If you have a lab appointment with the Cancer Center, please go directly to the Cancer Center and check in at the registration area.   Wear comfortable clothing and clothing appropriate for easy access to any Portacath or PICC line.   We strive to give you quality time with your provider. You may need to reschedule your appointment if you arrive late (15 or more minutes).  Arriving late affects you and other patients whose appointments are after yours.  Also, if you miss three or more appointments without notifying the office, you may be dismissed from the clinic at the provider's discretion.      For prescription refill requests, have your pharmacy contact our office and allow 72 hours for refills to be completed.    Today you received the following chemotherapy and/or immunotherapy agents Nivolumab      To help prevent nausea and vomiting after your treatment, we encourage you to take your nausea medication as directed.  BELOW ARE SYMPTOMS THAT SHOULD BE REPORTED IMMEDIATELY: *FEVER GREATER THAN 100.4 F (38 C) OR HIGHER *CHILLS OR SWEATING *NAUSEA AND VOMITING THAT IS NOT CONTROLLED WITH YOUR NAUSEA MEDICATION *UNUSUAL SHORTNESS OF BREATH *UNUSUAL BRUISING OR BLEEDING *URINARY PROBLEMS (pain or burning when urinating, or frequent urination) *BOWEL PROBLEMS (unusual diarrhea, constipation, pain near the anus) TENDERNESS IN MOUTH AND THROAT WITH OR WITHOUT PRESENCE OF ULCERS (sore throat, sores in mouth, or a toothache) UNUSUAL RASH, SWELLING OR PAIN  UNUSUAL VAGINAL DISCHARGE OR ITCHING   Items with * indicate a potential emergency and should be followed up as soon as possible or go to the Emergency Department if any problems should occur.  Please show the CHEMOTHERAPY ALERT CARD or IMMUNOTHERAPY ALERT  CARD at check-in to the Emergency Department and triage nurse.  Should you have questions after your visit or need to cancel or reschedule your appointment, please contact [facility] [place] - A DEPT OF [facility]  Dept: [phone]  and follow the prompts.  Office hours are 8:00 a.m. to 4:30 p.m. Monday - Friday. Please note that voicemails left after 4:00 p.m. may not be returned until the following business day.  We are closed weekends and major holidays. You have access to a nurse at all times for urgent questions. Please call the main number to the clinic Dept: [phone] and follow the prompts.  For any non-urgent questions, you may also contact your provider using MyChart. We now offer e-Visits for anyone 63 and older to request care online for non-urgent symptoms. For details visit [email or web address].   Also download the MyChart app! Go to the app store, search "MyChart", open the app, select [facility], and log in with your MyChart username and password.

## 2023-08-02 LAB — T4: T4, Total: 6.3 ug/dL (ref 4.5–12.0)

## 2023-08-06 DIAGNOSIS — C439 Malignant melanoma of skin, unspecified: Secondary | ICD-10-CM | POA: Diagnosis not present

## 2023-08-06 DIAGNOSIS — E559 Vitamin D deficiency, unspecified: Secondary | ICD-10-CM | POA: Diagnosis not present

## 2023-08-06 DIAGNOSIS — A6002 Herpesviral infection of other male genital organs: Secondary | ICD-10-CM | POA: Diagnosis not present

## 2023-08-06 DIAGNOSIS — G4700 Insomnia, unspecified: Secondary | ICD-10-CM | POA: Diagnosis not present

## 2023-08-06 DIAGNOSIS — Z9181 History of falling: Secondary | ICD-10-CM | POA: Diagnosis not present

## 2023-08-06 DIAGNOSIS — E785 Hyperlipidemia, unspecified: Secondary | ICD-10-CM | POA: Diagnosis not present

## 2023-08-06 DIAGNOSIS — Z125 Encounter for screening for malignant neoplasm of prostate: Secondary | ICD-10-CM | POA: Diagnosis not present

## 2023-08-06 DIAGNOSIS — Z1331 Encounter for screening for depression: Secondary | ICD-10-CM | POA: Diagnosis not present

## 2023-08-06 LAB — LAB REPORT - SCANNED: EGFR: 105

## 2023-08-07 ENCOUNTER — Encounter: Payer: Self-pay | Admitting: Oncology

## 2023-08-10 ENCOUNTER — Other Ambulatory Visit: Payer: Self-pay | Admitting: Oncology

## 2023-08-10 ENCOUNTER — Encounter: Payer: Self-pay | Admitting: Oncology

## 2023-08-10 DIAGNOSIS — L820 Inflamed seborrheic keratosis: Secondary | ICD-10-CM | POA: Diagnosis not present

## 2023-08-10 DIAGNOSIS — L578 Other skin changes due to chronic exposure to nonionizing radiation: Secondary | ICD-10-CM | POA: Diagnosis not present

## 2023-08-10 DIAGNOSIS — L821 Other seborrheic keratosis: Secondary | ICD-10-CM | POA: Diagnosis not present

## 2023-08-10 DIAGNOSIS — D225 Melanocytic nevi of trunk: Secondary | ICD-10-CM | POA: Diagnosis not present

## 2023-08-20 ENCOUNTER — Encounter: Payer: Self-pay | Admitting: Oncology

## 2023-08-25 ENCOUNTER — Encounter: Payer: Self-pay | Admitting: Oncology

## 2023-08-27 ENCOUNTER — Inpatient Hospital Stay: Payer: Medicare Other

## 2023-08-27 ENCOUNTER — Inpatient Hospital Stay: Payer: Medicare Other | Admitting: Hematology and Oncology

## 2023-08-27 ENCOUNTER — Inpatient Hospital Stay: Payer: Medicare Other | Attending: Hematology and Oncology | Admitting: Hematology and Oncology

## 2023-08-27 ENCOUNTER — Encounter: Payer: Self-pay | Admitting: Hematology and Oncology

## 2023-08-27 VITALS — BP 142/91

## 2023-08-27 VITALS — BP 140/97 | HR 64 | Temp 97.4°F | Resp 18 | Ht 69.0 in | Wt 172.6 lb

## 2023-08-27 DIAGNOSIS — C778 Secondary and unspecified malignant neoplasm of lymph nodes of multiple regions: Secondary | ICD-10-CM | POA: Diagnosis not present

## 2023-08-27 DIAGNOSIS — I2699 Other pulmonary embolism without acute cor pulmonale: Secondary | ICD-10-CM | POA: Diagnosis not present

## 2023-08-27 DIAGNOSIS — C439 Malignant melanoma of skin, unspecified: Secondary | ICD-10-CM

## 2023-08-27 DIAGNOSIS — Z5112 Encounter for antineoplastic immunotherapy: Secondary | ICD-10-CM | POA: Insufficient documentation

## 2023-08-27 DIAGNOSIS — Z79620 Long term (current) use of immunosuppressive biologic: Secondary | ICD-10-CM | POA: Diagnosis not present

## 2023-08-27 DIAGNOSIS — C7800 Secondary malignant neoplasm of unspecified lung: Secondary | ICD-10-CM | POA: Diagnosis not present

## 2023-08-27 DIAGNOSIS — I10 Essential (primary) hypertension: Secondary | ICD-10-CM

## 2023-08-27 DIAGNOSIS — C7801 Secondary malignant neoplasm of right lung: Secondary | ICD-10-CM | POA: Diagnosis not present

## 2023-08-27 DIAGNOSIS — C7931 Secondary malignant neoplasm of brain: Secondary | ICD-10-CM

## 2023-08-27 DIAGNOSIS — G629 Polyneuropathy, unspecified: Secondary | ICD-10-CM

## 2023-08-27 DIAGNOSIS — C779 Secondary and unspecified malignant neoplasm of lymph node, unspecified: Secondary | ICD-10-CM | POA: Insufficient documentation

## 2023-08-27 DIAGNOSIS — M5030 Other cervical disc degeneration, unspecified cervical region: Secondary | ICD-10-CM | POA: Diagnosis not present

## 2023-08-27 DIAGNOSIS — C801 Malignant (primary) neoplasm, unspecified: Secondary | ICD-10-CM

## 2023-08-27 DIAGNOSIS — C787 Secondary malignant neoplasm of liver and intrahepatic bile duct: Secondary | ICD-10-CM

## 2023-08-27 DIAGNOSIS — G588 Other specified mononeuropathies: Secondary | ICD-10-CM

## 2023-08-27 DIAGNOSIS — M79604 Pain in right leg: Secondary | ICD-10-CM | POA: Diagnosis not present

## 2023-08-27 DIAGNOSIS — C4361 Malignant melanoma of right upper limb, including shoulder: Secondary | ICD-10-CM | POA: Insufficient documentation

## 2023-08-27 DIAGNOSIS — C792 Secondary malignant neoplasm of skin: Secondary | ICD-10-CM | POA: Diagnosis not present

## 2023-08-27 DIAGNOSIS — C7802 Secondary malignant neoplasm of left lung: Secondary | ICD-10-CM | POA: Insufficient documentation

## 2023-08-27 DIAGNOSIS — M5126 Other intervertebral disc displacement, lumbar region: Secondary | ICD-10-CM | POA: Diagnosis not present

## 2023-08-27 HISTORY — DX: Other cervical disc degeneration, unspecified cervical region: M50.30

## 2023-08-27 LAB — CMP (CANCER CENTER ONLY)
ALT: 13 U/L (ref 0–44)
AST: 19 U/L (ref 15–41)
Albumin: 4.2 g/dL (ref 3.5–5.0)
Alkaline Phosphatase: 83 U/L (ref 38–126)
Anion gap: 8 (ref 5–15)
BUN: 15 mg/dL (ref 6–20)
CO2: 29 mmol/L (ref 22–32)
Calcium: 9.7 mg/dL (ref 8.9–10.3)
Chloride: 104 mmol/L (ref 98–111)
Creatinine: 0.78 mg/dL (ref 0.61–1.24)
GFR, Estimated: >60 mL/min (ref >60)
Glucose, Bld: 89 mg/dL (ref 70–99)
Potassium: 4.4 mmol/L (ref 3.5–5.1)
Sodium: 141 mmol/L (ref 135–145)
Total Bilirubin: 0.3 mg/dL (ref 0.0–1.2)
Total Protein: 6.9 g/dL (ref 6.5–8.1)

## 2023-08-27 LAB — CBC WITH DIFFERENTIAL (CANCER CENTER ONLY)
Abs Immature Granulocytes: 0.01 10*3/uL (ref 0.00–0.07)
Basophils Absolute: 0.1 10*3/uL (ref 0.0–0.1)
Basophils Relative: 1 %
Eosinophils Absolute: 0.1 10*3/uL (ref 0.0–0.5)
Eosinophils Relative: 3 %
HCT: 38.4 % — ABNORMAL LOW (ref 39.0–52.0)
Hemoglobin: 12.9 g/dL — ABNORMAL LOW (ref 13.0–17.0)
Immature Granulocytes: 0 %
Lymphocytes Relative: 26 %
Lymphs Abs: 1.4 10*3/uL (ref 0.7–4.0)
MCH: 31.2 pg (ref 26.0–34.0)
MCHC: 33.6 g/dL (ref 30.0–36.0)
MCV: 92.8 fL (ref 80.0–100.0)
Monocytes Absolute: 0.6 10*3/uL (ref 0.1–1.0)
Monocytes Relative: 10 %
Neutro Abs: 3.1 10*3/uL (ref 1.7–7.7)
Neutrophils Relative %: 60 %
Platelet Count: 285 10*3/uL (ref 150–400)
RBC: 4.14 MIL/uL — ABNORMAL LOW (ref 4.22–5.81)
RDW: 12.9 % (ref 11.5–15.5)
WBC Count: 5.3 10*3/uL (ref 4.0–10.5)
nRBC: 0 % (ref 0.0–0.2)
nRBC: 0 /100{WBCs}

## 2023-08-27 LAB — CK: Total CK: 50 U/L (ref 49–397)

## 2023-08-27 LAB — HEPATITIS B SURFACE ANTIGEN: Hepatitis B Surface Ag: NONREACTIVE

## 2023-08-27 LAB — HEPATITIS C ANTIBODY: HCV Ab: NONREACTIVE

## 2023-08-27 LAB — TSH: TSH: 2.055 u[IU]/mL (ref 0.350–4.500)

## 2023-08-27 LAB — C-REACTIVE PROTEIN: CRP: 0.5 mg/dL (ref <1.0)

## 2023-08-27 LAB — SEDIMENTATION RATE: Sed Rate: 8 mm/h (ref 0–16)

## 2023-08-27 LAB — FOLATE: Folate: >40 ng/mL (ref >5.9)

## 2023-08-27 LAB — VITAMIN B12: Vitamin B-12: 435 pg/mL (ref 180–914)

## 2023-08-27 MED ORDER — SODIUM CHLORIDE 0.9 % IV SOLN
480.0000 mg | Freq: Once | INTRAVENOUS | Status: AC
  Administered 2023-08-27: 480 mg via INTRAVENOUS
  Filled 2023-08-27: qty 48

## 2023-08-27 MED ORDER — LISINOPRIL 20 MG PO TABS: 20.0000 mg | ORAL_TABLET | Freq: Every day | ORAL | 1 refills | Status: DC

## 2023-08-27 MED ORDER — HEPARIN SOD (PORK) LOCK FLUSH 100 UNIT/ML IV SOLN
500.0000 [IU] | Freq: Once | INTRAVENOUS | Status: AC | PRN
  Administered 2023-08-27: 500 [IU]

## 2023-08-27 MED ORDER — SODIUM CHLORIDE 0.9 % IV SOLN: Freq: Once | INTRAVENOUS | Status: AC

## 2023-08-27 MED ORDER — SODIUM CHLORIDE 0.9% FLUSH
10.0000 mL | INTRAVENOUS | Status: DC | PRN
  Administered 2023-08-27: 10 mL

## 2023-08-27 MED ORDER — HYDROCODONE-ACETAMINOPHEN 5-325 MG PO TABS: 1.0000 | ORAL_TABLET | ORAL | 0 refills | Status: DC | PRN

## 2023-08-27 NOTE — Assessment & Plan Note (Addendum)
There was a new small hypermetabolic liver lesion in June 4098. This was less than 1 cm, but suspicious for metastasis. This was no longer present on repeat imaging.

## 2023-08-27 NOTE — Assessment & Plan Note (Addendum)
This was suspicious on the PET scan in the left frontoparietal lobe.  MRI of the brain confirmed a 2.6 cm heterogeneous lesion of the left superior temporal gyrus with mild enhancement and extensive surrounding edema.  He also has an 8 mm cortical lesion of the inferomedial right frontal lobe with mild edema, a 2 mm mildly enhancing cortical lesion of the inferolateral left frontal lobe and a intrinsically T1 hyperintense lesion of the left lateral pons measuring 7 mm with surrounding edema.  He has 4 lesions in total, but 3 are less than 1 cm.  He was treated with surgical resection of the larger lesion and stereotactic radiation.    He had recurrent neurologic symptoms with left-sided facial droop and left-sided weakness in September 2023 and was placed on dexamethasone 4 mg 3 times daily.  Brain revealed thin linear enhancement along the resection margin of the left temporal resection cavity, which may be postoperative, with additional small areas of somewhat more nodular enhancement, which are nonspecific, but could indicate recurrent disease. Attention on follow-up. Interval increase in the size of the right inferior frontal gyrus enhancing lesion with increased surrounding edema, mild local mass effect, and mild local midline shift. Stable to slightly decreased size of the left pontine lesion with decreased associated edema. Previously noted left inferior frontal gyrus lesion is no longer visualized. No definite new lesion.  Dr. Barbaraann Cao slowly weaned him off dexamethasone.  MRI brain in December 2023 was stable.  He has had persistent left facial weakness.   MRI brain in March revealed increased size of a hemorrhagic left pons/middle cerebellar peduncle lesion with increased surrounding edema, similar versus slightly decreased size of a 13 x 10 mm enhancing lesion in the medial right frontal lobe, and mildly diminished intrinsic T1 hyperintensity of the left temporal resection site with linear enhancement  along the lateral margin, probably postsurgical/post radiation.  PET brain in April did not show hyperactivity, but more radiation necrosis, so these changes are more consistent with posttreatment changes.  MRI head in August revealed new punctate superior right frontal lobe enhancement compatible with interval metastasis, interval regression of enhancement and edema in the left brachium pontis and pons. There was also a vasogenic edema at a pre-existing right frontal metastasis with underlying lesion enhancement remaining stable.  Dr. Barbaraann Cao recommended continued observation and saw him in December 2024 with a repeat MRI brain.  This looks quite good and the punctate lesion seen on the last scan has resolved.  We see no new lesions and overall the scan looks good, so Dr. Barbaraann Cao will repeat it 4 months this time.  This was suspicious on the PET scan in the left frontoparietal lobe.  MRI of the brain confirmed a 2.6 cm heterogeneous lesion of the left superior temporal gyrus with mild enhancement and extensive surrounding edema.  He also has an 8 mm cortical lesion of the inferomedial right frontal lobe with mild edema, a 2 mm mildly enhancing cortical lesion of the inferolateral left frontal lobe and a intrinsically T1 hyperintense lesion of the left lateral pons measuring 7 mm with surrounding edema.  He has 4 lesions in total, but 3 are less than 1 cm.  He was treated with surgical resection of the larger lesion and stereotactic radiation.    He had recurrent neurologic symptoms with left-sided facial droop and left-sided weakness in September 2023 and was placed on dexamethasone 4 mg 3 times daily.  Brain revealed thin linear enhancement along the resection margin of the  left temporal resection cavity, which may be postoperative, with additional small areas of somewhat more nodular enhancement, which are nonspecific, but could indicate recurrent disease. Attention on follow-up. Interval increase in the size of  the right inferior frontal gyrus enhancing lesion with increased surrounding edema, mild local mass effect, and mild local midline shift. Stable to slightly decreased size of the left pontine lesion with decreased associated edema. Previously noted left inferior frontal gyrus lesion is no longer visualized. No definite new lesion.  Dr. Barbaraann Cao slowly weaned him off dexamethasone.  MRI brain in December 2023 was stable.  He has had persistent left facial weakness.   MRI brain in March revealed increased size of a hemorrhagic left pons/middle cerebellar peduncle lesion with increased surrounding edema, similar versus slightly decreased size of a 13 x 10 mm enhancing lesion in the medial right frontal lobe, and mildly diminished intrinsic T1 hyperintensity of the left temporal resection site with linear enhancement along the lateral margin, probably postsurgical/post radiation.  PET brain in April did not show hyperactivity, but more radiation necrosis, so these changes are more consistent with posttreatment changes.  MRI head in August revealed new punctate superior right frontal lobe enhancement compatible with interval metastasis, interval regression of enhancement and edema in the left brachium pontis and pons. There was also a vasogenic edema at a pre-existing right frontal metastasis with underlying lesion enhancement remaining stable.  Dr. Barbaraann Cao recommended continued observation.  He has had chronic lightheadedness, hearing loss, and tinnitus that has been going on since radiation to the head, which worsens with loud noise, and he works in a loud environment. He has seen a ENT about this to no avail. This is felt to be due to radiation.  Dr. Barbaraann Cao saw him in December 2024 with a repeat MRI brain.  This looked quite good and the punctate lesion seen on the last scan had resolved.  There were no new lesions and overall the scan looks good, so Dr. Barbaraann Cao plans to repeat an MRI brain in 4 months this time.   The patient had a new headache yesterday, which resolved on its own.  He knows to contact us if he has recurrent headaches.

## 2023-08-27 NOTE — Assessment & Plan Note (Addendum)
He had multiple hypermetabolic nodes of the retroperitoneal and right pelvic areas, as well as right external iliac and a few additional nodes, which resolved.

## 2023-08-27 NOTE — Patient Instructions (Signed)
 Nivolumab Injection What is this medication? NIVOLUMAB (nye VOL ue mab) treats some types of cancer. It works by helping your immune system slow or stop the spread of cancer cells. It is a monoclonal antibody. This medicine may be used for other purposes; ask your health care provider or pharmacist if you have questions. COMMON BRAND NAME(S): Opdivo What should I tell my care team before I take this medication? They need to know if you have any of these conditions: Allogeneic stem cell transplant (uses someone else's stem cells) Autoimmune diseases, such as Crohn disease, ulcerative colitis, lupus History of chest radiation Nervous system problems, such as Guillain-Barre syndrome or myasthenia gravis Organ transplant An unusual or allergic reaction to nivolumab, other medications, foods, dyes, or preservatives Pregnant or trying to get pregnant Breast-feeding How should I use this medication? This medication is infused into a vein. It is given in a hospital or clinic setting. A special MedGuide will be given to you before each treatment. Be sure to read this information carefully each time. Talk to your care team about the use of this medication in children. While it may be prescribed for children as young as 12 years for selected conditions, precautions do apply. Overdosage: If you think you have taken too much of this medicine contact a poison control center or emergency room at once. NOTE: This medicine is only for you. Do not share this medicine with others. What if I miss a dose? Keep appointments for follow-up doses. It is important not to miss your dose. Call your care team if you are unable to keep an appointment. What may interact with this medication? Interactions have not been studied. This list may not describe all possible interactions. Give your health care provider a list of all the medicines, herbs, non-prescription drugs, or dietary supplements you use. Also tell them if you  smoke, drink alcohol, or use illegal drugs. Some items may interact with your medicine. What should I watch for while using this medication? Your condition will be monitored carefully while you are receiving this medication. You may need blood work while taking this medication. This medication may cause serious skin reactions. They can happen weeks to months after starting the medication. Contact your care team right away if you notice fevers or flu-like symptoms with a rash. The rash may be red or purple and then turn into blisters or peeling of the skin. You may also notice a red rash with swelling of the face, lips, or lymph nodes in your neck or under your arms. Tell your care team right away if you have any change in your eyesight. Talk to your care team if you are pregnant or think you might be pregnant. A negative pregnancy test is required before starting this medication. A reliable form of contraception is recommended while taking this medication and for 5 months after the last dose. Talk to your care team about effective forms of contraception. Do not breast-feed while taking this medication and for 5 months after the last dose. What side effects may I notice from receiving this medication? Side effects that you should report to your care team as soon as possible: Allergic reactions--skin rash, itching, hives, swelling of the face, lips, tongue, or throat Dry cough, shortness of breath or trouble breathing Eye pain, redness, irritation, or discharge with blurry or decreased vision Heart muscle inflammation--unusual weakness or fatigue, shortness of breath, chest pain, fast or irregular heartbeat, dizziness, swelling of the ankles, feet, or hands Hormone  gland problems--headache, sensitivity to light, unusual weakness or fatigue, dizziness, fast or irregular heartbeat, increased sensitivity to cold or heat, excessive sweating, constipation, hair loss, increased thirst or amount of urine,  tremors or shaking, irritability Infusion reactions--chest pain, shortness of breath or trouble breathing, feeling faint or lightheaded Kidney injury (glomerulonephritis)--decrease in the amount of urine, red or dark brown urine, foamy or bubbly urine, swelling of the ankles, hands, or feet Liver injury--right upper belly pain, loss of appetite, nausea, light-colored stool, dark yellow or brown urine, yellowing skin or eyes, unusual weakness or fatigue Pain, tingling, or numbness in the hands or feet, muscle weakness, change in vision, confusion or trouble speaking, loss of balance or coordination, trouble walking, seizures Rash, fever, and swollen lymph nodes Redness, blistering, peeling, or loosening of the skin, including inside the mouth Sudden or severe stomach pain, bloody diarrhea, fever, nausea, vomiting Side effects that usually do not require medical attention (report these to your care team if they continue or are bothersome): Bone, joint, or muscle pain Diarrhea Fatigue Loss of appetite Nausea Skin rash This list may not describe all possible side effects. Call your doctor for medical advice about side effects. You may report side effects to FDA at 1-800-FDA-1088. Where should I keep my medication? This medication is given in a hospital or clinic. It will not be stored at home. NOTE: This sheet is a summary. It may not cover all possible information. If you have questions about this medicine, talk to your doctor, pharmacist, or health care provider.  2024 Elsevier/Gold Standard (2021-10-21 00:00:00)

## 2023-08-27 NOTE — Progress Notes (Signed)
Ok to treat with DBP 91- Per Dr Gilman Buttner

## 2023-08-27 NOTE — Progress Notes (Signed)
 I am not 100 sure if this is everything [facility]  [address] [place],  [place]  [address] [phone]  Clinic Day:  08/27/2023  Referring physician: [name], NP  ASSESSMENT & PLAN:   Assessment & Plan: Melanoma of skin [facility]) Stage IIB malignant melanoma of the right posterior shoulder, Breslow's depth 3.3 mm, Clark level IV, treated with wide local excision, diagnosed in May 2021.  There was still a question as to whether this represented a metastatic lesion, rather than a primary, based on the presence of 2 nodules and no epidermal involvement.  MRI head and CT chest, abdomen and pelvis in June 2021 were negative for metastatic disease.   No adjuvant therapy was recommended.  PET scan in September was negative for metastatic disease.    In June 2023, he was found to have widespread metastatic disease, including to the brain.  CT scans in January 2024 revealed resolved subcarinal and bilateral hilar lymphadenopathy. Stable to decreased left pulmonary nodules. No new or progressive metastatic disease in the chest, abdomen or pelvis. Most recent CT scans in July 2024 stable. We will plan to repeat imaging in July, 2025 unless there is a need to do so sooner.   Malignant melanoma metastatic to skin [facility]) He presented with a new area of concern of the left mid back in June 2023.  A superficial nodule was noted approximately 1 cm to the right of his upper thoracic spine,and it had increased in size. Biopsy which revealed metastatic malignant melanoma.   He had several hypermetabolic subcutaneous nodules seen on PET, which have resolved with treatment.   Malignant neoplasm metastatic to liver [facility]) There was a new small hypermetabolic liver lesion in June 1914. This was less than 1 cm, but suspicious for metastasis. This was no longer present on repeat imaging.   Malignant neoplasm metastatic to lymph nodes of multiple sites [facility]) He had multiple hypermetabolic nodes  of the retroperitoneal and right pelvic areas, as well as right external iliac and a few additional nodes, which resolved.   Malignant melanoma metastatic to lung [facility]) He had bilateral hypermetabolic pulmonary nodules which increased in size by the time treatment was started. The largest nodule is up to 2.3 cm in diameter. CT in January 2024 revealed a good response. Most recent CT chest in July showed 2 tiny 4 mm nodules, which were persistent and unchanged.  No new disease was seen.  Malignant melanoma metastatic to brain [facility]) This was suspicious on the PET scan in the left frontoparietal lobe.  MRI of the brain confirmed a 2.6 cm heterogeneous lesion of the left superior temporal gyrus with mild enhancement and extensive surrounding edema.  He also has an 8 mm cortical lesion of the inferomedial right frontal lobe with mild edema, a 2 mm mildly enhancing cortical lesion of the inferolateral left frontal lobe and a intrinsically T1 hyperintense lesion of the left lateral pons measuring 7 mm with surrounding edema.  He has 4 lesions in total, but 3 are less than 1 cm.  He was treated with surgical resection of the larger lesion and stereotactic radiation.    He had recurrent neurologic symptoms with left-sided facial droop and left-sided weakness in September 2023 and was placed on dexamethasone 4 mg 3 times daily.  Brain revealed thin linear enhancement along the resection margin of the left temporal resection cavity, which may be postoperative, with additional small areas of somewhat more nodular enhancement, which are nonspecific,  but could indicate recurrent disease. Attention on follow-up. Interval increase in the size of the right inferior frontal gyrus enhancing lesion with increased surrounding edema, mild local mass effect, and mild local midline shift. Stable to slightly decreased size of the left pontine lesion with decreased associated edema. Previously noted left inferior frontal gyrus lesion  is no longer visualized. No definite new lesion.  Dr. Barbaraann Cao slowly weaned him off dexamethasone.  MRI brain in December 2023 was stable.  He has had persistent left facial weakness.   MRI brain in March revealed increased size of a hemorrhagic left pons/middle cerebellar peduncle lesion with increased surrounding edema, similar versus slightly decreased size of a 13 x 10 mm enhancing lesion in the medial right frontal lobe, and mildly diminished intrinsic T1 hyperintensity of the left temporal resection site with linear enhancement along the lateral margin, probably postsurgical/post radiation.  PET brain in April did not show hyperactivity, but more radiation necrosis, so these changes are more consistent with posttreatment changes.  MRI head in August revealed new punctate superior right frontal lobe enhancement compatible with interval metastasis, interval regression of enhancement and edema in the left brachium pontis and pons. There was also a vasogenic edema at a pre-existing right frontal metastasis with underlying lesion enhancement remaining stable.  Dr. Barbaraann Cao recommended continued observation and saw him in December 2024 with a repeat MRI brain.  This looks quite good and the punctate lesion seen on the last scan has resolved.  We see no new lesions and overall the scan looks good, so Dr. Barbaraann Cao will repeat it 4 months this time.  This was suspicious on the PET scan in the left frontoparietal lobe.  MRI of the brain confirmed a 2.6 cm heterogeneous lesion of the left superior temporal gyrus with mild enhancement and extensive surrounding edema.  He also has an 8 mm cortical lesion of the inferomedial right frontal lobe with mild edema, a 2 mm mildly enhancing cortical lesion of the inferolateral left frontal lobe and a intrinsically T1 hyperintense lesion of the left lateral pons measuring 7 mm with surrounding edema.  He has 4 lesions in total, but 3 are less than 1 cm.  He was treated with surgical  resection of the larger lesion and stereotactic radiation.    He had recurrent neurologic symptoms with left-sided facial droop and left-sided weakness in September 2023 and was placed on dexamethasone 4 mg 3 times daily.  Brain revealed thin linear enhancement along the resection margin of the left temporal resection cavity, which may be postoperative, with additional small areas of somewhat more nodular enhancement, which are nonspecific, but could indicate recurrent disease. Attention on follow-up. Interval increase in the size of the right inferior frontal gyrus enhancing lesion with increased surrounding edema, mild local mass effect, and mild local midline shift. Stable to slightly decreased size of the left pontine lesion with decreased associated edema. Previously noted left inferior frontal gyrus lesion is no longer visualized. No definite new lesion.  Dr. Barbaraann Cao slowly weaned him off dexamethasone.  MRI brain in December 2023 was stable.  He has had persistent left facial weakness.   MRI brain in March revealed increased size of a hemorrhagic left pons/middle cerebellar peduncle lesion with increased surrounding edema, similar versus slightly decreased size of a 13 x 10 mm enhancing lesion in the medial right frontal lobe, and mildly diminished intrinsic T1 hyperintensity of the left temporal resection site with linear enhancement along the lateral margin, probably postsurgical/post radiation.  PET  brain in April did not show hyperactivity, but more radiation necrosis, so these changes are more consistent with posttreatment changes.  MRI head in August revealed new punctate superior right frontal lobe enhancement compatible with interval metastasis, interval regression of enhancement and edema in the left brachium pontis and pons. There was also a vasogenic edema at a pre-existing right frontal metastasis with underlying lesion enhancement remaining stable.  Dr. Barbaraann Cao recommended continued  observation.  He has had chronic lightheadedness, hearing loss, and tinnitus that has been going on since radiation to the head, which worsens with loud noise, and he works in a loud environment. He has seen a ENT about this to no avail. This is felt to be due to radiation.  Dr. Barbaraann Cao saw him in December 2024 with a repeat MRI brain.  This looked quite good and the punctate lesion seen on the last scan had resolved.  There were no new lesions and overall the scan looks good, so Dr. Barbaraann Cao plans to repeat an MRI brain in 4 months this time.  The patient had a new headache yesterday, which resolved on its own.  He knows to contact us if he has recurrent headaches.  Peripheral neuropathy This is felt to possibly represent neuropathy from his immunotherapy.  MRI lumbar spine and July revealed a broad shallow left foraminal disc protrusion at L4 - L5 with a small anular fissure. No right foraminal stenosis. Mild left foraminal stenosis and no acute osseous injury of the lumbar spine.  We referred him back to  [name] who did his brain surgery.  He has had persistent right leg pain despite gabapentin, without clear explanation.  We have titrated up to gabapentin and prescribed hydrocodone/APAP 5/325 every 4 hours as needed.  He has seen [name] and had a MRI thoracic spine.  Increasing the gabapentin 300 mg to 4 tablets three times daily did not help, so Dr. Barbaraann Cao has switched him to Lyrica at 75 mg twice daily and it has helped much better.  It does seem to wear off soon, so we may want to increase the dose to 3 times daily.  Repeat MRI lumbar spine in December did not reveal any acute abnormality. There were stable minimal left foraminal narrowing at L4-5 and postoperative change of L1 corpectomy. Evaluation for other causes of neuropathy is pending from today.   Degenerative disc disease, cervical Chronic pain of both elbows, which is fairly severe, and radiates down to the wrist, right greater  than left, felt to be due to degenerative changes in the cervical spine.  CT neck done in in February 2024 at [facility] when he had a fractured left clavicle, revealed significant degenerative changes of the cervical spine, especially at C5 and C6, but multiple levels with foraminal stenosis.  His pain is fairly well-controlled with the current regimen  Acute pulmonary embolism without acute cor pulmonale (HCC) Non occlusive segmental pulmonary embolism in the inferior left upper lobe diagnosed in September 2023.  Due to his malignancy, he remains on rivaroxaban 20 mg daily.  He denies abnormal bruising or bleeding.  Hypertension This has been persistent and not controlled with the beta blocker.  Dr. Gilman Buttner added lisinopril 10 mg daily 3 weeks ago.  He has persistent hypertension, so I will increase lisinopril to 20 mg daily.    We will plan to see him back in 4 weeks with a CBC, comprehensive metabolic panel TSH and T4 prior to a 22nd cycle of nivolumab.  The  patient understands the plans discussed today and is in agreement with them.  He knows to contact our office if he develops concerns prior to his next appointment.   I provided 30 minutes of face-to-face time during this encounter and > 50% was spent counseling as documented under my assessment and plan.    Adah Perl, PA-C  [facility] [place] - [facility] [address] [place] [address] Dept: [phone] Dept Fax: [phone]   Orders Placed This Encounter  Procedures   ANCA screen with reflex titer    Standing Status:   Future    Expected Date:   08/27/2023    Expiration Date:   08/26/2024   Miscellaneous [facility] test (send-out)    Standing Status:   Future    Number of Occurrences:   1    Expected Date:   08/27/2023    Expiration Date:   08/26/2024    Test name / description::   ANCA screen with reflex titer      CHIEF COMPLAINT:  CC: Metastatic  melanoma  Current Treatment: Nivolumab every 4 weeks  HISTORY OF PRESENT ILLNESS:   Oncology History  Melanoma of skin (HCC)  12/01/2019 Initial Diagnosis   Melanoma of skin (HCC)   12/05/2019 Cancer Staging   Staging form: Melanoma of the Skin, AJCC 8th Edition - Clinical stage from 12/05/2019: Stage IIA (cT3a, cN0, cM0) - Signed by Dellia Beckwith, MD on 05/30/2020   02/18/2021 Genetic Testing   No pathogenic variants detected in [facility] Multi-Cancer +RNA Panel.  Variants of uncertain significance detected in APC (c.681C>G (p.Asp227Glu)), CDKN1C (c.610C>G (p.Pro204Ala)), and FH (c.1151C>T (p.Ala384Val)).  The report date is February 18, 2021.    The Multi-Cancer + RNA Panel offered by [facility] includes sequencing and/or deletion/duplication analysis of the following 84 genes:  AIP*, ALK, APC*, ATM*, AXIN2*, BAP1*, BARD1*, BLM*, BMPR1A*, BRCA1*, BRCA2*, BRIP1*, CASR, CDC73*, CDH1*, CDK4, CDKN1B*, CDKN1C*, CDKN2A, CEBPA, CHEK2*, CTNNA1*, DICER1*, DIS3L2*, EGFR, EPCAM, FH*, FLCN*, GATA2*, GPC3, GREM1, HOXB13, HRAS, KIT, MAX*, MEN1*, MET, MITF, MLH1*, MSH2*, MSH3*, MSH6*, MUTYH*, NBN*, NF1*, NF2*, NTHL1*, PALB2*, PDGFRA, PHOX2B, PMS2*, POLD1*, POLE*, POT1*, PRKAR1A*, PTCH1*, PTEN*, RAD50*, RAD51C*, RAD51D*, RB1*, RECQL4, RET, RUNX1*, SDHA*, SDHAF2*, SDHB*, SDHC*, SDHD*, SMAD4*, SMARCA4*, SMARCB1*, SMARCE1*, STK11*, SUFU*, TERC, TERT, TMEM127*, Tp53*, TSC1*, TSC2*, VHL*, WRN*, and WT1.  RNA analysis is performed for * genes.   01/30/2022 - 02/20/2022 Chemotherapy   Patient is on Treatment Plan : MELANOMA Nivolumab + Ipilimumab (1/3) q21d / Nivolumab q28d     01/30/2022 -  Chemotherapy   Patient is on Treatment Plan : MELANOMA Nivolumab (480) q28d     Malignant melanoma metastatic to brain (HCC)  12/19/2021 Initial Diagnosis   Malignant melanoma metastatic to brain (HCC)   01/30/2022 - 02/20/2022 Chemotherapy   Patient is on Treatment Plan : MELANOMA Nivolumab + Ipilimumab (1/3) q21d / Nivolumab  q28d     01/30/2022 -  Chemotherapy   Patient is on Treatment Plan : MELANOMA Nivolumab (480) q28d     Malignant melanoma metastatic to lung (HCC)  12/19/2021 Initial Diagnosis   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 Small nonocclusive segmental level pulmonary embolism in the  inferior LEFT upper lobe.  2. Marked bronchial wall thickening with areas of patchy basilar  opacities and new ground-glass opacities in the upper lobes.  Constellation of findings may be infectious bronchiolitis/bronchitis  though the possibility of drug related changes in the setting of  immunotherapy are also considered. Furthermore, given basilar  predominance and some material in basilar bronchial structures would  correlate with any risk factors for or history of aspiration.  3. Response to therapy of bilateral metastatic lesions. Some lymph  nodes in the chest with interval increase in size are equivocal at  this time, potentially reactive, but warrant attention on subsequent  imaging.    07/22/2022 Imaging   CT chest, abdomen and pelvis:  IMPRESSION:  1. Interval positive response to therapy. Resolved subcarinal and  bilateral hilar lymphadenopathy. Stable to decreased left pulmonary  nodules. No new or progressive metastatic disease in the chest,  abdomen or pelvis.  2. One vessel coronary atherosclerosis.  3. Aortic Atherosclerosis (ICD10-I70.0).    Malignant neoplasm metastatic to liver (HCC)  12/19/2021 Initial Diagnosis   Malignant neoplasm metastatic to liver (HCC)   01/30/2022 - 02/20/2022 Chemotherapy   Patient is on Treatment Plan : MELANOMA Nivolumab + Ipilimumab (1/3) q21d / Nivolumab q28d     01/30/2022  -  Chemotherapy   Patient is on Treatment Plan : MELANOMA Nivolumab (480) q28d     07/22/2022 Imaging   CT chest, abdomen and pelvis:  IMPRESSION:  1. Interval positive response to therapy. Resolved subcarinal and  bilateral hilar lymphadenopathy. Stable to decreased left pulmonary  nodules. No new or progressive metastatic disease in the chest,  abdomen or pelvis.  2. One vessel coronary atherosclerosis.  3. Aortic Atherosclerosis (ICD10-I70.0).        INTERVAL HISTORY:  Xavier Jones is here today for repeat clinical assessment prior to a 21st cycle of nivolumab.  Yesterday, he had a stabbing left temporal headache lasting about 30 minutes. He denies associated nausea or vomiting, vision changes, new weakness or paresthesia.  He reports occasional dizziness.  He reports occasional nausea without diarrhea, for which medication is effective.  He reports occasional diarrhea, not severe. He denies fevers or chills. He reports fair control of his pain with Lyrica twice daily and hydrocodone/APAP about 4 times a day. His appetite is fairly good. His weight has decreased 2 pounds over last 3 weeks . He continues to work 4 days a week.  REVIEW OF SYSTEMS:  Review of Systems  Constitutional:  Positive for fatigue. Negative for appetite change, chills, diaphoresis, fever and unexpected weight change.  HENT:   Positive for hearing loss. Negative for lump/mass, mouth sores and sore throat.   Eyes:  Positive for eye problems (dry left eye).  Respiratory:  Negative for cough and shortness of breath.   Cardiovascular:  Negative for chest pain and leg swelling.  Gastrointestinal:  Positive for diarrhea (occasional) and nausea (occasional). Negative for abdominal pain, constipation and vomiting.  Genitourinary:  Negative for difficulty urinating, dysuria, frequency and hematuria.   Musculoskeletal:  Positive for back pain (radiating down right leg to foot). Negative for arthralgias and myalgias.  Skin:   Negative for itching, rash and wound.  Neurological:  Positive for dizziness (occasional). Negative for extremity weakness, headaches, light-headedness and numbness.  Hematological:  Negative for adenopathy. Does not bruise/bleed easily.  Psychiatric/Behavioral:  Negative for depression and sleep disturbance. The patient is not nervous/anxious.  VITALS:  Blood pressure (!) 140/97, pulse 64, temperature (!) 97.4 F (36.3 C), temperature source Oral, resp. rate 18, height 5\' 9"  (1.753 m), weight 172 lb 9.6 oz (78.3 kg), SpO2 99%.  Wt Readings from Last 3 Encounters:  08/27/23 172 lb 9.6 oz (78.3 kg)  07/31/23 174 lb 9.6 oz (79.2 kg)  07/03/23 179 lb (81.2 kg)    Body mass index is 25.49 kg/m.  Performance status (ECOG): 1 - Symptomatic but completely ambulatory  PHYSICAL EXAM:  Physical Exam Vitals and nursing note reviewed.  Constitutional:      General: He is not in acute distress.    Appearance: Normal appearance. He is normal weight. He is not ill-appearing.  HENT:     Head: Normocephalic and atraumatic.     Mouth/Throat:     Mouth: Mucous membranes are moist.     Pharynx: Oropharynx is clear. No oropharyngeal exudate or posterior oropharyngeal erythema.  Eyes:     General: No scleral icterus.    Extraocular Movements: Extraocular movements intact.     Conjunctiva/sclera: Conjunctivae normal.     Pupils: Pupils are equal, round, and reactive to light.  Cardiovascular:     Rate and Rhythm: Normal rate and regular rhythm.     Heart sounds: Normal heart sounds. No murmur heard.    No friction rub. No gallop.  Pulmonary:     Effort: Pulmonary effort is normal.     Breath sounds: Normal breath sounds. No wheezing, rhonchi or rales.  Abdominal:     General: Bowel sounds are normal. There is no distension.     Palpations: Abdomen is soft. There is no hepatomegaly, splenomegaly or mass.     Tenderness: There is no abdominal tenderness.  Musculoskeletal:        General:  Normal range of motion.     Cervical back: Normal range of motion and neck supple. No tenderness.     Right lower leg: No edema.     Left lower leg: No edema.  Lymphadenopathy:     Cervical: No cervical adenopathy.     Upper Body:     Right upper body: No supraclavicular or axillary adenopathy.     Left upper body: No supraclavicular or axillary adenopathy.     Lower Body: No right inguinal adenopathy. No left inguinal adenopathy.  Skin:    General: Skin is warm and dry.     Coloration: Skin is not jaundiced.     Findings: No rash.  Neurological:     Mental Status: He is alert and oriented to person, place, and time.     Cranial Nerves: Facial asymmetry present.     Comments: Persistent left-sided facial weakness  Psychiatric:        Mood and Affect: Mood normal.        Behavior: Behavior normal.        Thought Content: Thought content normal.     LABS:      Latest Ref Rng & Units 08/27/2023    8:59 AM 07/31/2023    9:40 AM 07/03/2023    9:27 AM  CBC  WBC 4.0 - 10.5 K/uL 5.3  4.9  6.9   Hemoglobin 13.0 - 17.0 g/dL 09.8  11.9  14.7   Hematocrit 39.0 - 52.0 % 38.4  37.1  34.2   Platelets 150 - 400 K/uL 285  251  283       Latest Ref Rng & Units 08/27/2023    8:59 AM 07/31/2023  9:40 AM 07/03/2023    9:27 AM  CMP  Glucose 70 - 99 mg/dL 89  086  68   BUN 6 - 20 mg/dL 15  15  12    Creatinine 0.61 - 1.24 mg/dL 5.78  4.69  6.29   Sodium 135 - 145 mmol/L 141  145  145   Potassium 3.5 - 5.1 mmol/L 4.4  4.3  4.1   Chloride 98 - 111 mmol/L 104  107  108   CO2 22 - 32 mmol/L 29  28  28    Calcium 8.9 - 10.3 mg/dL 9.7  9.4  9.3   Total Protein 6.5 - 8.1 g/dL 6.9  6.6  6.3   Total Bilirubin 0.0 - 1.2 mg/dL 0.3  0.3  0.4   Alkaline Phos 38 - 126 U/L 83  84  92   AST 15 - 41 U/L 19  17  16    ALT 0 - 44 U/L 13  10  11      Lab Results  Component Value Date   TIBC 382 06/02/2023   TIBC 297 05/21/2022   FERRITIN 22 (L) 06/02/2023   FERRITIN 41 05/21/2022   IRONPCTSAT 23  06/02/2023   IRONPCTSAT 18 05/21/2022   Lab Results  Component Value Date   LDH 139 05/21/2022   LDH 145 12/19/2021    STUDIES:  No results found.    HISTORY:   Past Medical History:  Diagnosis Date   Acute pulmonary embolism without acute cor pulmonale (HCC) 05/01/2022   Anemia 05/20/2022   Depression    mild   Family history of breast cancer 01/22/2021   Family history of prostate cancer 01/22/2021   Genital herpes    History of back injury 2003   Pruritic rash 02/18/2022   Skin cancer    melanoma    Past Surgical History:  Procedure Laterality Date   APPLICATION OF CRANIAL NAVIGATION N/A 01/03/2022   Procedure: APPLICATION OF CRANIAL NAVIGATION;  Surgeon: Lisbeth Renshaw, MD;  Location: [facility] OR;  Service: Neurosurgery;  Laterality: N/A;   CRANIOTOMY Left 01/03/2022   Procedure: Stereotactic Left temporal craniotomy for resection of tumor;  Surgeon: Lisbeth Renshaw, MD;  [facility];  Service: Neurosurgery;  Laterality: Left;   MELANOMA EXCISION WITH SENTINEL LYMPH NODE BIOPSY Right 2021   SEPTOPLASTY  05/13/2023   Dr Marcheta Grammes   SPINAL FUSION W/ LUQUE UNIT ROD  2003    Family History  Problem Relation Age of Onset   Prostate cancer Father        dx 7s   Breast cancer Maternal Aunt        dx after 60; bilateral mastectomy   Stomach cancer Maternal Grandfather        dx 56s   Breast cancer Cousin 22       maternal male cousin; mets    Social History:  reports that he quit smoking about 27 years ago. His smoking use included cigarettes. He has never used smokeless tobacco. He reports current alcohol use of about 14.0 standard drinks of alcohol per week. He reports that he does not currently use drugs.The patient is alone today.  Allergies:  Allergies  Allergen Reactions   Sulfa Antibiotics Rash   Testosterone Itching    Gel    Current Medications: Current Outpatient Medications  Medication Sig Dispense Refill   lisinopril (ZESTRIL) 20 MG tablet  Take 1 tablet (20 mg total) by mouth daily. 30 tablet 1   acebutolol (SECTRAL) 200 MG capsule Take  1 capsule (200 mg total) by mouth daily. 90 capsule 3   acyclovir (ZOVIRAX) 400 MG tablet Take 400 mg by mouth 3 (three) times daily.     amitriptyline (ELAVIL) 25 MG tablet Take 50 mg by mouth at bedtime.     ergocalciferol (VITAMIN D2) 1.25 MG (50000 UT) capsule Take 50,000 Units by mouth every Wednesday.     folic acid (FOLVITE) 1 MG tablet Take 1 tablet (1 mg total) by mouth daily. 30 tablet 5   HYDROcodone-acetaminophen (NORCO/VICODIN) 5-325 MG tablet Take 1 tablet by mouth every 4 (four) hours as needed for moderate pain (pain score 4-6). 150 tablet 0   hydrOXYzine (ATARAX) 25 MG tablet TAKE ONE TABLET BY MOUTH 3 TIMES A DAY AS NEEDED FOR ITCHING 60 tablet 3   lidocaine-prilocaine (EMLA) cream Apply 1 Application topically as needed. 30 g 0   pentoxifylline (TRENTAL) 400 MG CR tablet Take 1 tablet (400 mg total) by mouth in the morning and at bedtime. 60 tablet 3   pregabalin (LYRICA) 75 MG capsule Take 1 capsule (75 mg total) by mouth 2 (two) times daily. 60 capsule 2   vitamin E 180 MG (400 UNITS) capsule Take 1 capsule (400 Units total) by mouth daily. 30 capsule 2   XARELTO 20 MG TABS tablet TAKE ONE TABLET BY MOUTH EVERY DAY WITH SUPPER. START AFTER 3 WEEKS OF 15MG 2 TIMES A DAY 30 tablet 3   No current facility-administered medications for this visit.   Facility-Administered Medications Ordered in Other Visits  Medication Dose Route Frequency Provider Last Rate Last Admin   sodium chloride flush (NS) 0.9 % injection 10 mL  10 mL Intracatheter PRN Dellia Beckwith, MD   10 mL at 08/27/23 1105

## 2023-08-27 NOTE — Assessment & Plan Note (Signed)
This has been persistent and not controlled with the beta blocker.  Dr. Gilman Buttner added lisinopril 10 mg daily 3 weeks ago.  He has persistent hypertension, so I will increase lisinopril to 20 mg daily.

## 2023-08-27 NOTE — Assessment & Plan Note (Signed)
Non occlusive segmental pulmonary embolism in the inferior left upper lobe diagnosed in September 2023.  Due to his malignancy, he remains on rivaroxaban 20 mg daily.  He denies abnormal bruising or bleeding.

## 2023-08-27 NOTE — Assessment & Plan Note (Signed)
Chronic pain of both elbows, which is fairly severe, and radiates down to the wrist, right greater than left, felt to be due to degenerative changes in the cervical spine.  CT neck done in in February 2024 at [facility] when he had a fractured left clavicle, revealed significant degenerative changes of the cervical spine, especially at C5 and C6, but multiple levels with foraminal stenosis.  His pain is fairly well-controlled with the current regimen

## 2023-08-27 NOTE — Assessment & Plan Note (Addendum)
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  MRI lumbar spine and July revealed a broad shallow left foraminal disc protrusion at L4 - L5 with a small anular fissure. No right foraminal stenosis. Mild left foraminal stenosis and no acute osseous injury of the lumbar spine.  We referred him back to  [name] who did his brain surgery.  He has had persistent right leg pain despite gabapentin, without clear explanation.  We have titrated up to gabapentin and prescribed hydrocodone/APAP 5/325 every 4 hours as needed.  He has seen [name] and had a MRI thoracic spine.  Increasing the gabapentin 300 mg to 4 tablets three times daily did not help, so Dr. Barbaraann Cao has switched him to Lyrica at 75 mg twice daily and it has helped much better.  It does seem to wear off soon, so we may want to increase the dose to 3 times daily.  Repeat MRI lumbar spine in December did not reveal any acute abnormality. There were stable minimal left foraminal narrowing at L4-5 and postoperative change of L1 corpectomy. Evaluation for other causes of neuropathy is pending from today.

## 2023-08-27 NOTE — Assessment & Plan Note (Addendum)
He had bilateral hypermetabolic pulmonary nodules which increased in size by the time treatment was started. The largest nodule is up to 2.3 cm in diameter. CT in January 2024 revealed a good response. Most recent CT chest in July showed 2 tiny 4 mm nodules, which were persistent and unchanged.  No new disease was seen.

## 2023-08-27 NOTE — Assessment & Plan Note (Addendum)
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  There was still a question as to whether this represented a metastatic lesion, rather than a primary, based on the presence of 2 nodules and no epidermal involvement.  MRI head and CT chest, abdomen and pelvis in June 2021 were negative for metastatic disease.   No adjuvant therapy was recommended.  PET scan in September was negative for metastatic disease.    In June 2023, he was found to have widespread metastatic disease, including to the brain.  CT scans in January 2024 revealed resolved subcarinal and bilateral hilar lymphadenopathy. Stable to decreased left pulmonary nodules. No new or progressive metastatic disease in the chest, abdomen or pelvis. Most recent CT scans in July 2024 stable. We will plan to repeat imaging in July, 2025 unless there is a need to do so sooner.

## 2023-08-27 NOTE — Assessment & Plan Note (Addendum)
He presented with a new area of concern of the left mid back in June 2023.  A superficial nodule was noted approximately 1 cm to the right of his upper thoracic spine,and it had increased in size. Biopsy which revealed metastatic malignant melanoma.   He had several hypermetabolic subcutaneous nodules seen on PET, which have resolved with treatment.

## 2023-08-28 ENCOUNTER — Other Ambulatory Visit: Payer: Self-pay | Admitting: Cardiology

## 2023-08-28 LAB — IGG, IGA, IGM
IgA: 234 mg/dL (ref 90–386)
IgG (Immunoglobin G), Serum: 898 mg/dL (ref 603–1613)
IgM (Immunoglobulin M), Srm: 53 mg/dL (ref 20–172)

## 2023-08-28 LAB — T4: T4, Total: 6.8 ug/dL (ref 4.5–12.0)

## 2023-08-29 LAB — VITAMIN B6: Vitamin B6: 21.9 ug/L (ref 3.4–65.2)

## 2023-08-29 LAB — COPPER, SERUM: Copper: 104 ug/dL (ref 69–132)

## 2023-08-31 LAB — IMMUNOFIXATION ELECTROPHORESIS
IgA: 237 mg/dL (ref 90–386)
IgG (Immunoglobin G), Serum: 881 mg/dL (ref 603–1613)
IgM (Immunoglobulin M), Srm: 54 mg/dL (ref 20–172)
Total Protein ELP: 6.7 g/dL (ref 6.0–8.5)

## 2023-08-31 LAB — PROTEIN ELECTROPHORESIS, SERUM
A/G Ratio: 1.4 (ref 0.7–1.7)
Albumin ELP: 3.9 g/dL (ref 2.9–4.4)
Alpha-1-Globulin: 0.2 g/dL (ref 0.0–0.4)
Alpha-2-Globulin: 0.7 g/dL (ref 0.4–1.0)
Beta Globulin: 1 g/dL (ref 0.7–1.3)
Gamma Globulin: 0.8 g/dL (ref 0.4–1.8)
Globulin, Total: 2.8 g/dL (ref 2.2–3.9)
Total Protein ELP: 6.7 g/dL (ref 6.0–8.5)

## 2023-09-01 LAB — VITAMIN B1: Vitamin B1 (Thiamine): 111.8 nmol/L (ref 66.5–200.0)

## 2023-09-02 LAB — ANTINUCLEAR ANTIBODIES, IFA: ANA Ab, IFA: NEGATIVE

## 2023-09-03 LAB — MISC LABCORP TEST (SEND OUT): Labcorp test code: 520149

## 2023-09-08 ENCOUNTER — Other Ambulatory Visit: Payer: Self-pay

## 2023-09-17 ENCOUNTER — Other Ambulatory Visit: Payer: Self-pay | Admitting: Oncology

## 2023-09-17 DIAGNOSIS — C787 Secondary malignant neoplasm of liver and intrahepatic bile duct: Secondary | ICD-10-CM

## 2023-09-17 DIAGNOSIS — C7800 Secondary malignant neoplasm of unspecified lung: Secondary | ICD-10-CM

## 2023-09-17 DIAGNOSIS — C439 Malignant melanoma of skin, unspecified: Secondary | ICD-10-CM

## 2023-09-17 DIAGNOSIS — C7931 Secondary malignant neoplasm of brain: Secondary | ICD-10-CM

## 2023-09-21 ENCOUNTER — Other Ambulatory Visit: Payer: Self-pay | Admitting: Internal Medicine

## 2023-09-22 ENCOUNTER — Encounter: Payer: Self-pay | Admitting: Oncology

## 2023-09-23 NOTE — Progress Notes (Signed)
 [name]  [address] [place],  [place]  [address] [phone]  Clinic Day:  09/24/23  Referring physician: [name], NP  ASSESSMENT & PLAN:  Assessment: Melanoma of skin (HCC) Stage IIB malignant melanoma of the right posterior shoulder, Breslow's depth 3.3 mm, Clark level IV, treated with wide local excision, diagnosed in May 2021.  There was still a question as to whether this represented a metastatic lesion, rather than a primary, based on the presence of 2 nodules and no epidermal involvement.  MRI head and CT chest, abdomen and pelvis in June 2021 were negative for metastatic disease.   No adjuvant therapy was recommended.  PET scan in September was negative for metastatic disease.  In June 2023, he was found to have widespread metastatic disease, including to the brain.  CT scans in January 2024 revealed resolved subcarinal and bilateral hilar lymphadenopathy. Stable to decreased left pulmonary nodules. No new or progressive metastatic disease in the chest, abdomen or pelvis. Most recent CT scans in July 2024 stable. We will plan to repeat imaging in July, 2025 unless there is a need to do so sooner.    Malignant melanoma metastatic to skin [name] He presented with a new area of concern of the left mid back in June 2023.  A superficial nodule was noted approximately 1 cm to the right of his upper thoracic spine,and it had increased in size. Biopsy which revealed metastatic malignant melanoma.   He had several hypermetabolic subcutaneous nodules seen on PET, which have resolved with treatment.    Malignant neoplasm metastatic to liver [name]) There was a new small hypermetabolic liver lesion in June 9147. This was less than 1 cm, but suspicious for metastasis. This was no longer present on repeat imaging.    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    Malignant melanoma metastatic to lung [name]) He had bilateral hypermetabolic pulmonary nodules which increased in size by the time treatment was started. The largest nodule is up to 2.3 cm in diameter. CT in January 2024 revealed a good response. Most recent CT chest in July showed 2 tiny 4 mm nodules, which were persistent and unchanged.  No new disease was seen.   Malignant melanoma metastatic to brain [name]) This was suspicious on the PET scan in the left frontoparietal lobe.  MRI of the brain confirmed a 2.6 cm heterogeneous lesion of the left superior temporal gyrus with mild enhancement and extensive surrounding edema.  He also has an 8 mm cortical lesion of the inferomedial right frontal lobe with mild edema, a 2 mm mildly enhancing cortical lesion of the inferolateral left frontal lobe and a intrinsically T1 hyperintense lesion of the left lateral pons measuring 7 mm with surrounding edema.  He has 4 lesions in total, but 3 are less than 1 cm.  He was treated with surgical resection of the larger lesion and stereotactic radiation.    He had recurrent neurologic symptoms with left-sided facial droop and left-sided weakness in September 2023 and was placed on dexamethasone 4 mg 3 times daily.  Brain revealed thin linear enhancement along the resection margin of the left temporal resection cavity, which may be postoperative, with additional small areas of somewhat more nodular enhancement, which are nonspecific, but could indicate recurrent disease. Attention on follow-up. Interval increase  in the size of the right inferior frontal gyrus enhancing lesion with increased surrounding edema, mild local mass effect, and mild local midline shift. Stable to slightly decreased size of the left pontine lesion with decreased associated edema. Previously noted left inferior frontal gyrus lesion is no longer visualized. No definite new lesion.  [name] slowly weaned him off dexamethasone.  MRI brain in December 2023 was stable.  He has had persistent left facial weakness.   MRI brain in March revealed increased size of a hemorrhagic left pons/middle cerebellar peduncle lesion with increased surrounding edema, similar versus slightly decreased size of a 13 x 10 mm enhancing lesion in the medial right frontal lobe, and mildly diminished intrinsic T1 hyperintensity of the left temporal resection site with linear enhancement along the lateral margin, probably postsurgical/post radiation.  PET brain in April did not show hyperactivity, but more radiation necrosis, so these changes are more consistent with posttreatment changes.  MRI head in August revealed new punctate superior right frontal lobe enhancement compatible with interval metastasis, interval regression of enhancement and edema in the left brachium pontis and pons. There was also a vasogenic edema at a pre-existing right frontal metastasis with underlying lesion enhancement remaining stable. MRI from December, 2024 looks quite good and the punctate lesion seen on the last scan has resolved. Dr. Barbaraann Cao recommended continued observation and repeat MRI in 4 months.     Peripheral neuropathy This is felt to possibly represent neuropathy from his immunotherapy.  MRI lumbar spine and July revealed a broad shallow left foraminal disc protrusion at L4 - L5 with a small anular fissure. No right foraminal stenosis. Mild left foraminal stenosis and no acute osseous injury of the lumbar spine.  We referred him back to  [name] who did his brain surgery.  He has had persistent right leg pain despite gabapentin, without clear explanation.  We have titrated up to gabapentin and prescribed hydrocodone/APAP 5/325 every 4 hours as needed.  He has seen [name] and had a MRI thoracic spine.  Increasing the gabapentin 300 mg to 4 tablets three times daily did not help, so Dr. Barbaraann Cao has switched him to  Lyrica at 75 mg twice daily and it has helped much better.  It does seem to wear off soon, so we may want to increase the dose to 3 times daily.  Repeat MRI lumbar spine in December did not reveal any acute abnormality. There were stable minimal left foraminal narrowing at L4-5 and postoperative change of L1 corpectomy. Evaluation for other causes of neuropathy was negative. I will increase his Lyrica to TID.    Degenerative disc disease, cervical Chronic pain of both elbows, which is fairly severe, and radiates down to the wrist, right greater than left, felt to be due to degenerative changes in the cervical spine.  CT neck done in in February 2024 at [facility] when he had a fractured left clavicle, revealed significant degenerative changes of the cervical spine, especially at C5 and C6, but multiple levels with foraminal stenosis.  His pain is fairly well-controlled with the current regimen   Acute pulmonary embolism without acute cor pulmonale (HCC) Non occlusive segmental pulmonary embolism in the inferior left upper lobe diagnosed in September 2023.  Due to his malignancy, he remains on rivaroxaban 20 mg daily.  He denies abnormal bruising or bleeding.   Plan:  He takes Lyrica 75 mg BID for pain management and I instructed him to increase this to TID. I will  refill his hydrocodone 5-325 mg today, he is taking 4-5 daily.  He has a MRI of the brain and follow-up with Dr. Barbaraann Cao scheduled in April. His BP is well controlled now on Lisinopril 10 mg daily. His day 1 cycle 22 of Nivolumab is scheduled on 09/24/2023. He has a WBC of 5.2, hemoglobin of 12.2, and platelet count of 264,000. His CMP is completley normal and TSH and T4 levels today are pending. I will see him back in 4 weeks with CBC, CMP, TSH, and T4. The patient understands the plans discussed today and is in agreement with them.  He knows to contact our office if he develops concerns prior to his next appointment.  I provided 14  minutes of face-to-face time during this encounter and > 50% was spent counseling as documented under my assessment and plan.   Dellia Beckwith, MD  CANCER CENTER [facility] - A DEPT OF [facility] [address] [place] [address] Dept: [phone] Dept Fax: [phone]   No orders of the defined types were placed in this encounter.  CHIEF COMPLAINT:  CC: Metastatic melanoma  Current Treatment: Nivolumab every 4 weeks  HISTORY OF PRESENT ILLNESS:   Oncology History  Melanoma of skin (HCC)  12/01/2019 Initial Diagnosis   Melanoma of skin (HCC)   12/05/2019 Cancer Staging   Staging form: Melanoma of the Skin, AJCC 8th Edition - Clinical stage from 12/05/2019: Stage IIA (cT3a, cN0, cM0) - Signed by Dellia Beckwith, MD on 05/30/2020   02/18/2021 Genetic Testing   No pathogenic variants detected in [facility] Multi-Cancer +RNA Panel.  Variants of uncertain significance detected in APC (c.681C>G (p.Asp227Glu)), CDKN1C (c.610C>G (p.Pro204Ala)), and FH (c.1151C>T (p.Ala384Val)).  The report date is February 18, 2021.    The Multi-Cancer + RNA Panel offered by [facility] includes sequencing and/or deletion/duplication analysis of the following 84 genes:  AIP*, ALK, APC*, ATM*, AXIN2*, BAP1*, BARD1*, BLM*, BMPR1A*, BRCA1*, BRCA2*, BRIP1*, CASR, CDC73*, CDH1*, CDK4, CDKN1B*, CDKN1C*, CDKN2A, CEBPA, CHEK2*, CTNNA1*, DICER1*, DIS3L2*, EGFR, EPCAM, FH*, FLCN*, GATA2*, GPC3, GREM1, HOXB13, HRAS, KIT, MAX*, MEN1*, MET, MITF, MLH1*, MSH2*, MSH3*, MSH6*, MUTYH*, NBN*, NF1*, NF2*, NTHL1*, PALB2*, PDGFRA, PHOX2B, PMS2*, POLD1*, POLE*, POT1*, PRKAR1A*, PTCH1*, PTEN*, RAD50*, RAD51C*, RAD51D*, RB1*, RECQL4, RET, RUNX1*, SDHA*, SDHAF2*, SDHB*, SDHC*, SDHD*, SMAD4*, SMARCA4*, SMARCB1*, SMARCE1*, STK11*, SUFU*, TERC, TERT, TMEM127*, Tp53*, TSC1*, TSC2*, VHL*, WRN*, and WT1.  RNA analysis is performed for * genes.   01/30/2022 - 02/20/2022 Chemotherapy   Patient is on  Treatment Plan : MELANOMA Nivolumab + Ipilimumab (1/3) q21d / Nivolumab q28d     01/30/2022 -  Chemotherapy   Patient is on Treatment Plan : MELANOMA Nivolumab (480) q28d     Malignant melanoma metastatic to brain (HCC)  12/19/2021 Initial Diagnosis   Malignant melanoma metastatic to brain (HCC)   01/30/2022 - 02/20/2022 Chemotherapy   Patient is on Treatment Plan : MELANOMA Nivolumab + Ipilimumab (1/3) q21d / Nivolumab q28d     01/30/2022 -  Chemotherapy   Patient is on Treatment Plan : MELANOMA Nivolumab (480) q28d     Malignant melanoma metastatic to lung (HCC)  12/19/2021 Initial Diagnosis   Malignant melanoma metastatic to lung (HCC)   01/30/2022 - 02/20/2022 Chemotherapy   Patient is on Treatment Plan : MELANOMA Nivolumab + Ipilimumab (1/3) q21d / Nivolumab q28d     01/30/2022 -  Chemotherapy   Patient is on Treatment Plan : MELANOMA Nivolumab (480) q28d     03/24/2022 Imaging  CTA chest:  IMPRESSION:  1. Small nonocclusive segmental level pulmonary embolism in the  inferior LEFT upper lobe.  2. Marked bronchial wall thickening with areas of patchy basilar  opacities and new ground-glass opacities in the upper lobes.  Constellation of findings may be infectious bronchiolitis/bronchitis  though the possibility of drug related changes in the setting of  immunotherapy are also considered. Furthermore, given basilar  predominance and some material in basilar bronchial structures would  correlate with any risk factors for or history of aspiration.  3. Response to therapy of bilateral metastatic lesions. Some lymph  nodes in the chest with interval increase in size are equivocal at  this time, potentially reactive, but warrant attention on subsequent  imaging.    07/22/2022 Imaging   CT chest, abdomen and pelvis:  IMPRESSION:  1. Interval positive response to therapy. Resolved subcarinal and  bilateral hilar lymphadenopathy. Stable to decreased left pulmonary  nodules. No new  or progressive metastatic disease in the chest,  abdomen or pelvis.  2. One vessel coronary atherosclerosis.  3. Aortic Atherosclerosis (ICD10-I70.0).    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 Interval positive response to therapy. Resolved subcarinal and  bilateral hilar lymphadenopathy. Stable to decreased left pulmonary  nodules. No new or progressive metastatic disease in the chest,  abdomen or pelvis.  2. One vessel coronary atherosclerosis.  3. Aortic Atherosclerosis (ICD10-I70.0).      INTERVAL HISTORY:  Xavier Jones is here today for repeat clinical assessment for his metastatic melanoma. Patient states that he feels ok but complains of pain in his right arm rating 6/10, right leg pain rating 5/10, neuropathy of the right foot, and sleep disturbance due to pain. He takes Lyrica 75mg  BID for pain management and I instructed him to increase this to TID. I will refill his hydrocodone 5-325mg  today, he is taking 4-5 daily.  He has a MRI of the brain and follow-up with Dr. Barbaraann Cao scheduled in April. His BP is well controlled now on Lisinopril 10mg  daily. His day 1 cycle 22 of Nivolumab is scheduled on 09/24/2023. He has a WBC of 5.2, hemoglobin of 12.2, and platelet count of 264,000. His CMP is completley normal and TSH and T4 levels today are pending. I will see him back in 4 weeks with CBC, CMP, TSH, and T4.  He denies signs of infection such as sore throat, sinus drainage, cough, or urinary symptoms.  He denies fevers or recurrent chills. He denies nausea, vomiting, chest pain, dyspnea or cough. His appetite is good  and his weight has decreased 2 pounds over last month .   REVIEW OF SYSTEMS:  Review of Systems  Constitutional:  Positive for fatigue. Negative for appetite change, chills, diaphoresis, fever and unexpected weight change.  HENT:   Positive for hearing loss. Negative for lump/mass, mouth sores, nosebleeds, sore throat, tinnitus, trouble swallowing and voice change.   Eyes:  Positive for eye problems (dry left eye).  Respiratory: Negative.  Negative for chest tightness, cough, hemoptysis, shortness of breath and wheezing.   Cardiovascular: Negative.  Negative for chest pain, leg  swelling and palpitations.  Gastrointestinal:  Negative for abdominal distention, abdominal pain, blood in stool, constipation, diarrhea (occasional), nausea (occasional) and vomiting.  Endocrine: Negative.  Negative for hot flashes.  Genitourinary: Negative.  Negative for bladder incontinence, difficulty urinating, dyspareunia, dysuria, frequency, hematuria, nocturia, pelvic pain and penile discharge.   Musculoskeletal:  Positive for back pain (radiating down right leg to foot). Negative for arthralgias, flank pain, gait problem, myalgias, neck pain and neck stiffness.        Right arm pain rating 6/10, right leg pain  Skin: Negative.  Negative for itching, rash and wound.  Neurological:  Positive for dizziness (occasional) and numbness (right foot). Negative for extremity weakness, gait problem, headaches, light-headedness, seizures and speech difficulty.  Hematological: Negative.  Negative for adenopathy. Does not bruise/bleed easily.  Psychiatric/Behavioral:  Positive for sleep disturbance. Negative for confusion, decreased concentration, depression and suicidal ideas. The patient is not nervous/anxious.      VITALS:  Blood pressure 124/81, pulse 62, temperature 98.1 F (36.7 C), temperature source Oral, resp. rate 16, height 5\' 9"  (1.753 m), weight 170 lb 12.8 oz (77.5 kg).  Wt Readings from Last 3 Encounters:   09/24/23 170 lb 12.8 oz (77.5 kg)  08/27/23 172 lb 9.6 oz (78.3 kg)  07/31/23 174 lb 9.6 oz (79.2 kg)    Body mass index is 25.22 kg/m.  Performance status (ECOG): 1 - Symptomatic but completely ambulatory  PHYSICAL EXAM:  Physical Exam Vitals and nursing note reviewed.  Constitutional:      General: He is not in acute distress.    Appearance: Normal appearance. He is normal weight. He is not ill-appearing, toxic-appearing or diaphoretic.  HENT:     Head: Normocephalic and atraumatic.     Right Ear: Tympanic membrane, ear canal and external ear normal. There is no impacted cerumen.     Left Ear: Tympanic membrane, ear canal and external ear normal. There is no impacted cerumen.     Nose: Nose normal. No congestion or rhinorrhea.     Mouth/Throat:     Mouth: Mucous membranes are moist.     Pharynx: Oropharynx is clear. No oropharyngeal exudate or posterior oropharyngeal erythema.  Eyes:     General: No scleral icterus.       Right eye: No discharge.        Left eye: No discharge.     Extraocular Movements: Extraocular movements intact.     Conjunctiva/sclera: Conjunctivae normal.     Pupils: Pupils are equal, round, and reactive to light.  Neck:     Vascular: No carotid bruit.  Cardiovascular:     Rate and Rhythm: Normal rate and regular rhythm.     Pulses: Normal pulses.     Heart sounds: Normal heart sounds. No murmur heard.    No friction rub. No gallop.  Pulmonary:     Effort: Pulmonary effort is normal. No respiratory distress.     Breath sounds: Normal breath sounds. No stridor. No wheezing, rhonchi or rales.  Chest:     Chest wall: No tenderness.  Abdominal:     General: Bowel sounds are normal. There is no distension.     Palpations: Abdomen is soft. There is no hepatomegaly, splenomegaly or mass.     Tenderness: There is abdominal tenderness. There is no right CVA tenderness, left CVA tenderness, guarding or rebound.     Hernia: No hernia is present.   Musculoskeletal:        General: Normal range of motion.  Cervical back: Normal range of motion and neck supple. No rigidity or tenderness.     Right lower leg: No edema.     Left lower leg: No edema.     Comments: Prominent left acromioclavicular joint  Lymphadenopathy:     Cervical: No cervical adenopathy.     Upper Body:     Right upper body: No supraclavicular or axillary adenopathy.     Left upper body: No supraclavicular or axillary adenopathy.     Lower Body: No right inguinal adenopathy. No left inguinal adenopathy.  Skin:    General: Skin is warm and dry.     Coloration: Skin is not jaundiced or pale.     Findings: No bruising, erythema, lesion or rash.  Neurological:     General: No focal deficit present.     Mental Status: He is alert and oriented to person, place, and time. Mental status is at baseline.     Cranial Nerves: Facial asymmetry present. No cranial nerve deficit.     Sensory: No sensory deficit.     Motor: No weakness.     Coordination: Coordination normal.     Gait: Gait normal.     Deep Tendon Reflexes: Reflexes normal.     Comments: Persistent left-sided facial weakness  Psychiatric:        Mood and Affect: Mood normal.        Behavior: Behavior normal.        Thought Content: Thought content normal.        Judgment: Judgment normal.    LABS:      Latest Ref Rng & Units 09/24/2023    9:31 AM 08/27/2023    8:59 AM 07/31/2023    9:40 AM  CBC  WBC 4.0 - 10.5 K/uL 5.2  5.3  4.9   Hemoglobin 13.0 - 17.0 g/dL 09.8  11.9  14.7   Hematocrit 39.0 - 52.0 % 35.9  38.4  37.1   Platelets 150 - 400 K/uL 264  285  251       Latest Ref Rng & Units 09/24/2023    9:31 AM 08/27/2023    8:59 AM 07/31/2023    9:40 AM  CMP  Glucose 70 - 99 mg/dL 90  89  829   BUN 6 - 20 mg/dL 17  15  15    Creatinine 0.61 - 1.24 mg/dL 5.62  1.30  8.65   Sodium 135 - 145 mmol/L 142  141  145   Potassium 3.5 - 5.1 mmol/L 4.1  4.4  4.3   Chloride 98 - 111 mmol/L 106  104  107    CO2 22 - 32 mmol/L 27  29  28    Calcium 8.9 - 10.3 mg/dL 9.5  9.7  9.4   Total Protein 6.5 - 8.1 g/dL 6.7  6.9  6.6   Total Bilirubin 0.0 - 1.2 mg/dL 0.4  0.3  0.3   Alkaline Phos 38 - 126 U/L 81  83  84   AST 15 - 41 U/L 20  19  17    ALT 0 - 44 U/L 13  13  10     Lab Results  Component Value Date   TIBC 382 06/02/2023   TIBC 297 05/21/2022   FERRITIN 22 (L) 06/02/2023   FERRITIN 41 05/21/2022   IRONPCTSAT 23 06/02/2023   IRONPCTSAT 18 05/21/2022   Lab Results  Component Value Date   TSH 1.465 09/24/2023   T4TOTAL 6.8 09/24/2023   Lab Results  Component  Value Date   LDH 139 05/21/2022   LDH 145 12/19/2021   STUDIES:  No results found.    HISTORY:   Past Medical History:  Diagnosis Date   Acute pulmonary embolism without acute cor pulmonale (HCC) 05/01/2022   Anemia 05/20/2022   Depression    mild   Family history of breast cancer 01/22/2021   Family history of prostate cancer 01/22/2021   Genital herpes    History of back injury 2003   Pruritic rash 02/18/2022   Skin cancer    melanoma    Past Surgical History:  Procedure Laterality Date   APPLICATION OF CRANIAL NAVIGATION N/A 01/03/2022   Procedure: APPLICATION OF CRANIAL NAVIGATION;  Surgeon: Lisbeth Renshaw, MD;  Location: [facility] OR;  Service: Neurosurgery;  Laterality: N/A;   CRANIOTOMY Left 01/03/2022   Procedure: Stereotactic Left temporal craniotomy for resection of tumor;  Surgeon: Lisbeth Renshaw, MD;  Location: [facility] OR;  Service: Neurosurgery;  Laterality: Left;   MELANOMA EXCISION WITH SENTINEL LYMPH NODE BIOPSY Right 2021   SEPTOPLASTY  05/13/2023   Dr Marcheta Grammes   SPINAL FUSION W/ LUQUE UNIT ROD  2003    Family History  Problem Relation Age of Onset   Prostate cancer Father        dx 76s   Breast cancer Maternal Aunt        dx after 54; bilateral mastectomy   Stomach cancer Maternal Grandfather        dx 56s   Breast cancer Cousin 5       maternal male cousin; mets    Social History:   reports that he quit smoking about 27 years ago. His smoking use included cigarettes. He has never used smokeless tobacco. He reports current alcohol use of about 14.0 standard drinks of alcohol per week. He reports that he does not currently use drugs.The patient is alone today.  Allergies:  Allergies  Allergen Reactions   Sulfa Antibiotics Rash   Testosterone Itching    Gel    Current Medications: Current Outpatient Medications  Medication Sig Dispense Refill   acebutolol (SECTRAL) 200 MG capsule TAKE ONE CAPSULE BY MOUTH EVERY DAY 90 capsule 1   acyclovir (ZOVIRAX) 400 MG tablet Take 400 mg by mouth 3 (three) times daily.     amitriptyline (ELAVIL) 25 MG tablet Take 50 mg by mouth at bedtime.     ergocalciferol (VITAMIN D2) 1.25 MG (50000 UT) capsule Take 50,000 Units by mouth every Wednesday.     folic acid (FOLVITE) 1 MG tablet Take 1 tablet (1 mg total) by mouth daily. 30 tablet 5   hydrOXYzine (ATARAX) 25 MG tablet TAKE ONE TABLET BY MOUTH 3 TIMES A DAY AS NEEDED FOR ITCHING 60 tablet 3   lidocaine-prilocaine (EMLA) cream Apply 1 Application topically as needed. 30 g 0   lisinopril (ZESTRIL) 20 MG tablet Take 1 tablet (20 mg total) by mouth daily. 30 tablet 1   pentoxifylline (TRENTAL) 400 MG CR tablet Take 1 tablet (400 mg total) by mouth in the morning and at bedtime. 60 tablet 3   vitamin E 180 MG (400 UNITS) capsule Take 1 capsule (400 Units total) by mouth daily. 30 capsule 2   XARELTO 20 MG TABS tablet TAKE ONE TABLET BY MOUTH EVERY DAY WITH SUPPER. START AFTER 3 WEEKS OF 15MG 2 TIMES A DAY 30 tablet 3   HYDROcodone-acetaminophen (NORCO/VICODIN) 5-325 MG tablet Take 1 tablet by mouth every 4 (four) hours as needed for moderate pain (pain score 4-6).  150 tablet 0   pregabalin (LYRICA) 75 MG capsule Take 1 capsule (75 mg total) by mouth 3 (three) times daily. 90 capsule 5   No current facility-administered medications for this visit.    I,Jasmine M Lassiter,acting as a scribe  for Dellia Beckwith, MD.,have documented all relevant documentation on the behalf of Dellia Beckwith, MD,as directed by  Dellia Beckwith, MD while in the presence of Dellia Beckwith, MD.

## 2023-09-24 ENCOUNTER — Inpatient Hospital Stay: Payer: Medicare Other

## 2023-09-24 ENCOUNTER — Inpatient Hospital Stay: Payer: Medicare Other | Attending: Hematology and Oncology | Admitting: Oncology

## 2023-09-24 ENCOUNTER — Other Ambulatory Visit: Payer: Self-pay | Admitting: Oncology

## 2023-09-24 VITALS — BP 124/81 | HR 62 | Temp 98.1°F | Resp 16 | Ht 69.0 in | Wt 170.8 lb

## 2023-09-24 DIAGNOSIS — C778 Secondary and unspecified malignant neoplasm of lymph nodes of multiple regions: Secondary | ICD-10-CM | POA: Diagnosis not present

## 2023-09-24 DIAGNOSIS — C439 Malignant melanoma of skin, unspecified: Secondary | ICD-10-CM

## 2023-09-24 DIAGNOSIS — C7931 Secondary malignant neoplasm of brain: Secondary | ICD-10-CM | POA: Insufficient documentation

## 2023-09-24 DIAGNOSIS — G622 Polyneuropathy due to other toxic agents: Secondary | ICD-10-CM

## 2023-09-24 DIAGNOSIS — C7800 Secondary malignant neoplasm of unspecified lung: Secondary | ICD-10-CM

## 2023-09-24 DIAGNOSIS — Z79899 Other long term (current) drug therapy: Secondary | ICD-10-CM | POA: Insufficient documentation

## 2023-09-24 DIAGNOSIS — M5126 Other intervertebral disc displacement, lumbar region: Secondary | ICD-10-CM

## 2023-09-24 DIAGNOSIS — Z5112 Encounter for antineoplastic immunotherapy: Secondary | ICD-10-CM | POA: Insufficient documentation

## 2023-09-24 DIAGNOSIS — C4361 Malignant melanoma of right upper limb, including shoulder: Secondary | ICD-10-CM | POA: Insufficient documentation

## 2023-09-24 DIAGNOSIS — C787 Secondary malignant neoplasm of liver and intrahepatic bile duct: Secondary | ICD-10-CM

## 2023-09-24 DIAGNOSIS — Z79620 Long term (current) use of immunosuppressive biologic: Secondary | ICD-10-CM | POA: Insufficient documentation

## 2023-09-24 DIAGNOSIS — G629 Polyneuropathy, unspecified: Secondary | ICD-10-CM

## 2023-09-24 DIAGNOSIS — M79604 Pain in right leg: Secondary | ICD-10-CM

## 2023-09-24 LAB — CBC WITH DIFFERENTIAL (CANCER CENTER ONLY)
Abs Immature Granulocytes: 0.01 10*3/uL (ref 0.00–0.07)
Basophils Absolute: 0.1 10*3/uL (ref 0.0–0.1)
Basophils Relative: 1 %
Eosinophils Absolute: 0.2 10*3/uL (ref 0.0–0.5)
Eosinophils Relative: 3 %
HCT: 35.9 % — ABNORMAL LOW (ref 39.0–52.0)
Hemoglobin: 12.2 g/dL — ABNORMAL LOW (ref 13.0–17.0)
Immature Granulocytes: 0 %
Lymphocytes Relative: 33 %
Lymphs Abs: 1.7 10*3/uL (ref 0.7–4.0)
MCH: 31.3 pg (ref 26.0–34.0)
MCHC: 34 g/dL (ref 30.0–36.0)
MCV: 92.1 fL (ref 80.0–100.0)
Monocytes Absolute: 0.6 10*3/uL (ref 0.1–1.0)
Monocytes Relative: 11 %
Neutro Abs: 2.7 10*3/uL (ref 1.7–7.7)
Neutrophils Relative %: 52 %
Platelet Count: 264 10*3/uL (ref 150–400)
RBC: 3.9 MIL/uL — ABNORMAL LOW (ref 4.22–5.81)
RDW: 12.9 % (ref 11.5–15.5)
WBC Count: 5.2 10*3/uL (ref 4.0–10.5)
nRBC: 0 % (ref 0.0–0.2)
nRBC: 0 /100{WBCs}

## 2023-09-24 LAB — CMP (CANCER CENTER ONLY)
ALT: 13 U/L (ref 0–44)
AST: 20 U/L (ref 15–41)
Albumin: 4.4 g/dL (ref 3.5–5.0)
Alkaline Phosphatase: 81 U/L (ref 38–126)
Anion gap: 9 (ref 5–15)
BUN: 17 mg/dL (ref 6–20)
CO2: 27 mmol/L (ref 22–32)
Calcium: 9.5 mg/dL (ref 8.9–10.3)
Chloride: 106 mmol/L (ref 98–111)
Creatinine: 0.83 mg/dL (ref 0.61–1.24)
GFR, Estimated: >60 mL/min (ref >60)
Glucose, Bld: 90 mg/dL (ref 70–99)
Potassium: 4.1 mmol/L (ref 3.5–5.1)
Sodium: 142 mmol/L (ref 135–145)
Total Bilirubin: 0.4 mg/dL (ref 0.0–1.2)
Total Protein: 6.7 g/dL (ref 6.5–8.1)

## 2023-09-24 LAB — TSH: TSH: 1.465 u[IU]/mL (ref 0.350–4.500)

## 2023-09-24 MED ORDER — SODIUM CHLORIDE 0.9 % IV SOLN
480.0000 mg | Freq: Once | INTRAVENOUS | Status: AC
  Administered 2023-09-24: 480 mg via INTRAVENOUS
  Filled 2023-09-24: qty 48

## 2023-09-24 MED ORDER — SODIUM CHLORIDE 0.9 % IV SOLN: Freq: Once | INTRAVENOUS | Status: AC

## 2023-09-24 MED ORDER — HYDROCODONE-ACETAMINOPHEN 5-325 MG PO TABS
1.0000 | ORAL_TABLET | ORAL | 0 refills | Status: DC | PRN
Start: 2023-09-24 — End: 2023-10-22

## 2023-09-24 MED ORDER — PREGABALIN 75 MG PO CAPS: 75.0000 mg | ORAL_CAPSULE | Freq: Three times a day (TID) | ORAL | 5 refills | Status: DC

## 2023-09-24 MED ORDER — SODIUM CHLORIDE 0.9% FLUSH: 10.0000 mL | INTRAVENOUS | Status: DC | PRN

## 2023-09-24 MED ORDER — HEPARIN SOD (PORK) LOCK FLUSH 100 UNIT/ML IV SOLN: 500.0000 [IU] | Freq: Once | INTRAVENOUS | Status: DC | PRN

## 2023-09-24 NOTE — Patient Instructions (Signed)
 Nivolumab Injection What is this medication? NIVOLUMAB (nye VOL ue mab) treats some types of cancer. It works by helping your immune system slow or stop the spread of cancer cells. It is a monoclonal antibody. This medicine may be used for other purposes; ask your health care provider or pharmacist if you have questions. COMMON BRAND NAME(S): Opdivo What should I tell my care team before I take this medication? They need to know if you have any of these conditions: Allogeneic stem cell transplant (uses someone else's stem cells) Autoimmune diseases, such as Crohn disease, ulcerative colitis, lupus History of chest radiation Nervous system problems, such as Guillain-Barre syndrome or myasthenia gravis Organ transplant An unusual or allergic reaction to nivolumab, other medications, foods, dyes, or preservatives Pregnant or trying to get pregnant Breast-feeding How should I use this medication? This medication is infused into a vein. It is given in a hospital or clinic setting. A special MedGuide will be given to you before each treatment. Be sure to read this information carefully each time. Talk to your care team about the use of this medication in children. While it may be prescribed for children as young as 12 years for selected conditions, precautions do apply. Overdosage: If you think you have taken too much of this medicine contact a poison control center or emergency room at once. NOTE: This medicine is only for you. Do not share this medicine with others. What if I miss a dose? Keep appointments for follow-up doses. It is important not to miss your dose. Call your care team if you are unable to keep an appointment. What may interact with this medication? Interactions have not been studied. This list may not describe all possible interactions. Give your health care provider a list of all the medicines, herbs, non-prescription drugs, or dietary supplements you use. Also tell them if you  smoke, drink alcohol, or use illegal drugs. Some items may interact with your medicine. What should I watch for while using this medication? Your condition will be monitored carefully while you are receiving this medication. You may need blood work while taking this medication. This medication may cause serious skin reactions. They can happen weeks to months after starting the medication. Contact your care team right away if you notice fevers or flu-like symptoms with a rash. The rash may be red or purple and then turn into blisters or peeling of the skin. You may also notice a red rash with swelling of the face, lips, or lymph nodes in your neck or under your arms. Tell your care team right away if you have any change in your eyesight. Talk to your care team if you are pregnant or think you might be pregnant. A negative pregnancy test is required before starting this medication. A reliable form of contraception is recommended while taking this medication and for 5 months after the last dose. Talk to your care team about effective forms of contraception. Do not breast-feed while taking this medication and for 5 months after the last dose. What side effects may I notice from receiving this medication? Side effects that you should report to your care team as soon as possible: Allergic reactions--skin rash, itching, hives, swelling of the face, lips, tongue, or throat Dry cough, shortness of breath or trouble breathing Eye pain, redness, irritation, or discharge with blurry or decreased vision Heart muscle inflammation--unusual weakness or fatigue, shortness of breath, chest pain, fast or irregular heartbeat, dizziness, swelling of the ankles, feet, or hands Hormone  gland problems--headache, sensitivity to light, unusual weakness or fatigue, dizziness, fast or irregular heartbeat, increased sensitivity to cold or heat, excessive sweating, constipation, hair loss, increased thirst or amount of urine,  tremors or shaking, irritability Infusion reactions--chest pain, shortness of breath or trouble breathing, feeling faint or lightheaded Kidney injury (glomerulonephritis)--decrease in the amount of urine, red or dark brown urine, foamy or bubbly urine, swelling of the ankles, hands, or feet Liver injury--right upper belly pain, loss of appetite, nausea, light-colored stool, dark yellow or brown urine, yellowing skin or eyes, unusual weakness or fatigue Pain, tingling, or numbness in the hands or feet, muscle weakness, change in vision, confusion or trouble speaking, loss of balance or coordination, trouble walking, seizures Rash, fever, and swollen lymph nodes Redness, blistering, peeling, or loosening of the skin, including inside the mouth Sudden or severe stomach pain, bloody diarrhea, fever, nausea, vomiting Side effects that usually do not require medical attention (report these to your care team if they continue or are bothersome): Bone, joint, or muscle pain Diarrhea Fatigue Loss of appetite Nausea Skin rash This list may not describe all possible side effects. Call your doctor for medical advice about side effects. You may report side effects to FDA at 1-800-FDA-1088. Where should I keep my medication? This medication is given in a hospital or clinic. It will not be stored at home. NOTE: This sheet is a summary. It may not cover all possible information. If you have questions about this medicine, talk to your doctor, pharmacist, or health care provider.  2024 Elsevier/Gold Standard (2021-10-21 00:00:00)

## 2023-09-25 LAB — T4: T4, Total: 6.8 ug/dL (ref 4.5–12.0)

## 2023-10-01 ENCOUNTER — Encounter: Payer: Self-pay | Admitting: Oncology

## 2023-10-05 ENCOUNTER — Encounter: Payer: Self-pay | Admitting: Oncology

## 2023-10-09 ENCOUNTER — Encounter: Payer: Self-pay | Admitting: Oncology

## 2023-10-16 ENCOUNTER — Encounter: Payer: Self-pay | Admitting: Oncology

## 2023-10-20 ENCOUNTER — Encounter: Payer: Self-pay | Admitting: Oncology

## 2023-10-21 NOTE — Progress Notes (Signed)
 [facility]  [address] [place],  [place]  [address] [phone]  Clinic Day:  10/22/2023  Referring physician: Barney Boozer, NP  ASSESSMENT & PLAN:   Assessment & Plan: Melanoma of skin [facility]) Stage IIB malignant melanoma of the right posterior shoulder, Breslow's depth 3.3 mm, Clark level IV, treated with wide local excision, diagnosed in May 2021.  There was still a question as to whether this represented a metastatic lesion, rather than a primary, based on the presence of 2 nodules and no epidermal involvement.  MRI head and CT chest, abdomen and pelvis in June 2021 were negative for metastatic disease.   No adjuvant therapy was recommended.  PET scan in September was negative for metastatic disease.    In June 2023, he was found to have widespread metastatic disease, including to the brain.  CT scans in January 2024 revealed resolved subcarinal and bilateral hilar lymphadenopathy. Stable to decreased left pulmonary nodules. No new or progressive metastatic disease in the chest, abdomen or pelvis. Most recent CT scans in July 2024 stable. We will plan to repeat imaging in July 2025 as long as he remains stable.  Malignant melanoma metastatic to brain [facility]) This was suspicious on the PET scan in the left frontoparietal lobe.  MRI of the brain confirmed a 2.6 cm heterogeneous lesion of the left superior temporal gyrus with mild enhancement and extensive surrounding edema.  He also has an 8 mm cortical lesion of the inferomedial right frontal lobe with mild edema, a 2 mm mildly enhancing cortical lesion of the inferolateral left frontal lobe and a intrinsically T1 hyperintense lesion of the left lateral pons measuring 7 mm with surrounding edema.  He has 4 lesions in total, but 3 are less than 1 cm.  He was treated with surgical resection of the larger lesion and stereotactic radiation.    He had recurrent neurologic symptoms with left-sided facial droop and left-sided  weakness in September 2023 and was placed on dexamethasone 4 mg 3 times daily.  Brain revealed thin linear enhancement along the resection margin of the left temporal resection cavity, which may be postoperative, with additional small areas of somewhat more nodular enhancement, which are nonspecific, but could indicate recurrent disease. Attention on follow-up. Interval increase in the size of the right inferior frontal gyrus enhancing lesion with increased surrounding edema, mild local mass effect, and mild local midline shift. Stable to slightly decreased size of the left pontine lesion with decreased associated edema. Previously noted left inferior frontal gyrus lesion is no longer visualized. No definite new lesion.  Dr. Mark Sil slowly weaned him off dexamethasone.  MRI brain in December 2023 was stable.  He has had persistent left facial weakness.   MRI brain in March revealed increased size of a hemorrhagic left pons/middle cerebellar peduncle lesion with increased surrounding edema, similar versus slightly decreased size of a 13 x 10 mm enhancing lesion in the medial right frontal lobe, and mildly diminished intrinsic T1 hyperintensity of the left temporal resection site with linear enhancement along the lateral margin, probably postsurgical/post radiation.  PET brain in April did not show hyperactivity, but more radiation necrosis, so these changes are more consistent with posttreatment changes.  MRI head in August revealed new punctate superior right frontal lobe enhancement compatible with interval metastasis, interval regression of enhancement and edema in the left brachium pontis and pons. There was also a vasogenic edema at a pre-existing right frontal metastasis with underlying lesion enhancement remaining stable.  Dr. Mark Sil recommended continued observation and saw him in December 2024 with a repeat MRI brain.  This looks quite good and the punctate lesion seen on the last scan has resolved.  We see  no new lesions and overall the scan looks good, so Dr. Mark Sil will repeat it 4 months this time.  This was suspicious on the PET scan in the left frontoparietal lobe.  MRI of the brain confirmed a 2.6 cm heterogeneous lesion of the left superior temporal gyrus with mild enhancement and extensive surrounding edema.  He also has an 8 mm cortical lesion of the inferomedial right frontal lobe with mild edema, a 2 mm mildly enhancing cortical lesion of the inferolateral left frontal lobe and a intrinsically T1 hyperintense lesion of the left lateral pons measuring 7 mm with surrounding edema.  He has 4 lesions in total, but 3 are less than 1 cm.  He was treated with surgical resection of the larger lesion and stereotactic radiation.    He had recurrent neurologic symptoms with left-sided facial droop and left-sided weakness in September 2023 and was placed on dexamethasone 4 mg 3 times daily.  Brain revealed thin linear enhancement along the resection margin of the left temporal resection cavity, which may be postoperative, with additional small areas of somewhat more nodular enhancement, which are nonspecific, but could indicate recurrent disease. Attention on follow-up. Interval increase in the size of the right inferior frontal gyrus enhancing lesion with increased surrounding edema, mild local mass effect, and mild local midline shift. Stable to slightly decreased size of the left pontine lesion with decreased associated edema. Previously noted left inferior frontal gyrus lesion is no longer visualized. No definite new lesion.  Dr. Mark Sil slowly weaned him off dexamethasone.  MRI brain in December 2023 was stable.  He has had persistent left facial weakness.   MRI brain in March revealed increased size of a hemorrhagic left pons/middle cerebellar peduncle lesion with increased surrounding edema, similar versus slightly decreased size of a 13 x 10 mm enhancing lesion in the medial right frontal lobe, and mildly  diminished intrinsic T1 hyperintensity of the left temporal resection site with linear enhancement along the lateral margin, probably postsurgical/post radiation.  PET brain in April did not show hyperactivity, but more radiation necrosis, so these changes are more consistent with posttreatment changes.  MRI head in August revealed new punctate superior right frontal lobe enhancement compatible with interval metastasis, interval regression of enhancement and edema in the left brachium pontis and pons. There was also a vasogenic edema at a pre-existing right frontal metastasis with underlying lesion enhancement remaining stable.  Dr. Mark Sil recommended continued observation.  He has had chronic lightheadedness, hearing loss, and tinnitus that has been going on since radiation to the head, which worsens with loud noise, and he works in a loud environment. He has seen a ENT about this to no avail. This is felt to be due to radiation.  Dr. Mark Sil saw him in December 2024 with a repeat MRI brain.  This looked quite good and the punctate lesion seen on the last scan had resolved.  There were no new lesions and overall the scan looked good.  He has a repeat MRI brain on April 18 and sees Dr. Mark Sil on April 21.  Malignant melanoma metastatic to lung [facility]) He had bilateral hypermetabolic pulmonary nodules which increased in size by the time treatment was started. The largest nodule is up to 2.3 cm in diameter. CT in January 2024 revealed  a good response. Most recent CT chest in July showed 2 tiny 4 mm nodules, which were persistent and unchanged.  No new disease was seen.  He has continued maintenance nivolumab.  He is doing well at this time, so we will proceed with a 23rd cycle of nivolumab today.  We will plan to see him back in 4 weeks with a CBC, comprehensive metabolic panel and TSH prior to 24th cycle.  Malignant melanoma metastatic to skin [facility]) He presented with a new area of concern of the left mid  back in June 2023.  A superficial nodule was noted approximately 1 cm to the right of his upper thoracic spine,and it had increased in size. Biopsy which revealed metastatic malignant melanoma.   He had several hypermetabolic subcutaneous nodules seen on PET, which have resolved with treatment. There are no new skin lesions.    The patient understands the plans discussed today and is in agreement with them.  He knows to contact our office if he develops concerns prior to his next appointment.   I provided 15 minutes of face-to-face time during this encounter and > 50% was spent counseling as documented under my assessment and plan.    Imanni Burdine A Micaela Stith, PA-C  [facility] [place] - A DEPT OF [facility]. [facility] [address] [place] [address] Dept: [phone] Dept Fax: [phone]   No orders of the defined types were placed in this encounter.     CHIEF COMPLAINT:  CC: Metastatic melanoma  Current Treatment: Nivolumab every 4 weeks  HISTORY OF PRESENT ILLNESS:   Oncology History  Melanoma of skin (HCC)  12/01/2019 Initial Diagnosis   Melanoma of skin (HCC)   12/05/2019 Cancer Staging   Staging form: Melanoma of the Skin, AJCC 8th Edition - Clinical stage from 12/05/2019: Stage IIA (cT3a, cN0, cM0) - Signed by Nolia Baumgartner, MD on 05/30/2020   02/18/2021 Genetic Testing   No pathogenic variants detected in [facility] Multi-Cancer +RNA Panel.  Variants of uncertain significance detected in APC (c.681C>G (p.Asp227Glu)), CDKN1C (c.610C>G (p.Pro204Ala)), and FH (c.1151C>T (p.Ala384Val)).  The report date is February 18, 2021.    The Multi-Cancer + RNA Panel offered by [facility] includes sequencing and/or deletion/duplication analysis of the following 84 genes:  AIP*, ALK, APC*, ATM*, AXIN2*, BAP1*, BARD1*, BLM*, BMPR1A*, BRCA1*, BRCA2*, BRIP1*, CASR, CDC73*, CDH1*, CDK4, CDKN1B*, CDKN1C*, CDKN2A, CEBPA, CHEK2*, CTNNA1*, DICER1*, DIS3L2*, EGFR,  EPCAM, FH*, FLCN*, GATA2*, GPC3, GREM1, HOXB13, HRAS, KIT, MAX*, MEN1*, MET, MITF, MLH1*, MSH2*, MSH3*, MSH6*, MUTYH*, NBN*, NF1*, NF2*, NTHL1*, PALB2*, PDGFRA, PHOX2B, PMS2*, POLD1*, POLE*, POT1*, PRKAR1A*, PTCH1*, PTEN*, RAD50*, RAD51C*, RAD51D*, RB1*, RECQL4, RET, RUNX1*, SDHA*, SDHAF2*, SDHB*, SDHC*, SDHD*, SMAD4*, SMARCA4*, SMARCB1*, SMARCE1*, STK11*, SUFU*, TERC, TERT, TMEM127*, Tp53*, TSC1*, TSC2*, VHL*, WRN*, and WT1.  RNA analysis is performed for * genes.   01/30/2022 - 02/20/2022 Chemotherapy   Patient is on Treatment Plan : MELANOMA Nivolumab + Ipilimumab (1/3) q21d / Nivolumab q28d     01/30/2022 -  Chemotherapy   Patient is on Treatment Plan : MELANOMA Nivolumab (480) q28d     Malignant melanoma metastatic to brain (HCC)  12/19/2021 Initial Diagnosis   Malignant melanoma metastatic to brain (HCC)   01/30/2022 - 02/20/2022 Chemotherapy   Patient is on Treatment Plan : MELANOMA Nivolumab + Ipilimumab (1/3) q21d / Nivolumab q28d     01/30/2022 -  Chemotherapy   Patient is on Treatment Plan : MELANOMA Nivolumab (480) q28d     Malignant  melanoma metastatic to lung (HCC)  12/19/2021 Initial Diagnosis   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 Small nonocclusive segmental level pulmonary embolism in the  inferior LEFT upper lobe.  2. Marked bronchial wall thickening with areas of patchy basilar  opacities and new ground-glass opacities in the upper lobes.  Constellation of findings may be infectious bronchiolitis/bronchitis  though the possibility of drug related changes in the setting of  immunotherapy are also considered. Furthermore, given basilar  predominance and some material in basilar bronchial  structures would  correlate with any risk factors for or history of aspiration.  3. Response to therapy of bilateral metastatic lesions. Some lymph  nodes in the chest with interval increase in size are equivocal at  this time, potentially reactive, but warrant attention on subsequent  imaging.    07/22/2022 Imaging   CT chest, abdomen and pelvis:  IMPRESSION:  1. Interval positive response to therapy. Resolved subcarinal and  bilateral hilar lymphadenopathy. Stable to decreased left pulmonary  nodules. No new or progressive metastatic disease in the chest,  abdomen or pelvis.  2. One vessel coronary atherosclerosis.  3. Aortic Atherosclerosis (ICD10-I70.0).    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 Interval positive response to therapy. Resolved subcarinal and  bilateral hilar lymphadenopathy. Stable to decreased left pulmonary  nodules. No new or progressive metastatic disease in the chest,  abdomen or pelvis.  2. One vessel coronary atherosclerosis.  3. Aortic Atherosclerosis (ICD10-I70.0).        INTERVAL HISTORY:  Estaban is here today for repeat clinical assessment prior to 23rd cycle of nivolumab.  He continues to tolerate this without fairly well difficulty.  He reports intermittent diarrhea for which Imodium is effective.  He denies cough, shortness of breath, pruritus or skin rashes.  He has persistent neuropathy partially controlled with pregabalin.  He has chronic left facial weakness with dryness of the left eye, but denies any new neurologic symptoms.  He continues saline drops to help with the  eye dryness.  He denies any symptoms concerning for progressive disease.  He denies fevers or chills. He denies pain. His appetite is good. His weight has increased 4 pounds over last 4 weeks .  He continues to work.  He is scheduled for MRI brain tomorrow and follow-up with Dr. Barbaraann Cao on Monday.  REVIEW OF SYSTEMS:  Review of Systems  Constitutional:  Negative for appetite change, chills, diaphoresis, fatigue, fever and unexpected weight change.  HENT:   Negative for lump/mass, mouth sores and sore throat.   Respiratory:  Negative for cough and shortness of breath.   Cardiovascular:  Negative for chest pain and leg swelling.  Gastrointestinal:  Positive for diarrhea. Negative for abdominal pain, blood in stool, constipation, nausea and vomiting.  Genitourinary:  Negative for difficulty urinating, dysuria, frequency and hematuria.   Musculoskeletal:  Negative for arthralgias, back pain, gait problem and myalgias.  Skin:  Negative for itching, rash and wound.  Neurological:  Negative for dizziness, extremity weakness, gait problem, headaches, light-headedness and numbness.  Hematological:  Negative for adenopathy.  Psychiatric/Behavioral:  Negative for depression and sleep disturbance. The patient is not nervous/anxious.      VITALS:  Blood pressure 129/88, pulse 63, temperature 98.6 F (37 C), temperature source Oral, resp. rate 18, height 5\' 9"  (1.753 m), weight 174 lb 9.6 oz (79.2 kg), SpO2 99%.  Wt Readings from Last 3 Encounters:  10/22/23 174 lb 9.6 oz (79.2 kg)  09/24/23 170 lb 12.8 oz (77.5 kg)  08/27/23 172 lb 9.6 oz (78.3 kg)    Body mass index is 25.78 kg/m.  Performance status (ECOG): 1 - Symptomatic but completely ambulatory  PHYSICAL EXAM:  Physical Exam Vitals and nursing note reviewed.  Constitutional:      General: He is not in acute distress.    Appearance: Normal appearance. He is normal weight.  HENT:     Head: Normocephalic and atraumatic.     Mouth/Throat:      Mouth: Mucous membranes are moist.     Pharynx: Oropharynx is clear. No oropharyngeal exudate or posterior oropharyngeal erythema.  Eyes:     General: No scleral icterus.    Extraocular Movements: Extraocular movements intact.     Conjunctiva/sclera: Conjunctivae normal.     Pupils: Pupils are equal, round, and reactive to light.  Cardiovascular:     Rate and Rhythm: Normal rate and regular rhythm.     Heart sounds: Normal heart sounds. No murmur heard.    No friction rub. No gallop.  Pulmonary:     Effort: Pulmonary effort is normal.     Breath sounds: Normal breath sounds. No wheezing, rhonchi or rales.  Abdominal:     General: Bowel sounds are normal. There is no distension.     Palpations: Abdomen is soft. There is no hepatomegaly, splenomegaly or mass.     Tenderness: There is no abdominal tenderness.  Musculoskeletal:        General: Normal range of motion.     Cervical back: Normal range of motion and neck supple. No tenderness.     Right lower leg: No edema.     Left lower leg: No edema.  Lymphadenopathy:     Cervical: No cervical adenopathy.     Upper Body:     Right upper body: No supraclavicular or axillary adenopathy.     Left upper body: No supraclavicular or axillary adenopathy.     Lower Body: No right inguinal adenopathy. No left inguinal adenopathy.  Skin:    General: Skin is warm and dry.     Coloration: Skin is not jaundiced.     Findings: No rash.  Neurological:     Mental Status: He is alert and oriented to person, place, and time.     Cranial Nerves: Facial asymmetry (left facial weakness) present.  Psychiatric:        Mood and Affect: Mood normal.        Behavior: Behavior normal.        Thought Content: Thought content normal.     LABS:      Latest Ref Rng & Units 10/22/2023    8:30 AM 09/24/2023    9:31 AM 08/27/2023    8:59 AM  CBC  WBC 4.0 - 10.5 K/uL  4.6  5.2  5.3   Hemoglobin 13.0 - 17.0 g/dL 16.1  09.6  04.5   Hematocrit 39.0 - 52.0  % 34.7  35.9  38.4   Platelets 150 - 400 K/uL 251  264  285       Latest Ref Rng & Units 10/22/2023    8:30 AM 09/24/2023    9:31 AM 08/27/2023    8:59 AM  CMP  Glucose 70 - 99 mg/dL 94  90  89   BUN 6 - 20 mg/dL 20  17  15    Creatinine 0.61 - 1.24 mg/dL 4.09  8.11  9.14   Sodium 135 - 145 mmol/L 142  142  141   Potassium 3.5 - 5.1 mmol/L 4.1  4.1  4.4   Chloride 98 - 111 mmol/L 105  106  104   CO2 22 - 32 mmol/L 29  27  29    Calcium 8.9 - 10.3 mg/dL 9.3  9.5  9.7   Total Protein 6.5 - 8.1 g/dL 6.3  6.7  6.9   Total Bilirubin 0.0 - 1.2 mg/dL 0.4  0.4  0.3   Alkaline Phos 38 - 126 U/L 80  81  83   AST 15 - 41 U/L 15  20  19    ALT 0 - 44 U/L 7  13  13      Lab Results  Component Value Date   TOTALPROTELP 6.7 08/27/2023   TOTALPROTELP 6.7 08/27/2023   ALBUMINELP 3.9 08/27/2023   A1GS 0.2 08/27/2023   A2GS 0.7 08/27/2023   BETS 1.0 08/27/2023   GAMS 0.8 08/27/2023   MSPIKE Not Observed 08/27/2023   SPEI Comment 08/27/2023   Lab Results  Component Value Date   TIBC 382 06/02/2023   TIBC 297 05/21/2022   FERRITIN 22 (L) 06/02/2023   FERRITIN 41 05/21/2022   IRONPCTSAT 23 06/02/2023   IRONPCTSAT 18 05/21/2022   Lab Results  Component Value Date   LDH 139 05/21/2022   LDH 145 12/19/2021    STUDIES:  No results found.    HISTORY:   Past Medical History:  Diagnosis Date   Acute pulmonary embolism without acute cor pulmonale (HCC) 05/01/2022   Anemia 05/20/2022   Depression    mild   Family history of breast cancer 01/22/2021   Family history of prostate cancer 01/22/2021   Genital herpes    History of back injury 2003   Pruritic rash 02/18/2022   Skin cancer    melanoma    Past Surgical History:  Procedure Laterality Date   APPLICATION OF CRANIAL NAVIGATION N/A 01/03/2022   Procedure: APPLICATION OF CRANIAL NAVIGATION;  Surgeon: Lisbeth Renshaw, MD;  Location: [facility] OR;  Service: Neurosurgery;  Laterality: N/A;   CRANIOTOMY Left 01/03/2022   Procedure:  Stereotactic Left temporal craniotomy for resection of tumor;  Surgeon: Lisbeth Renshaw, MD;  Location: [facility] OR;  Service: Neurosurgery;  Laterality: Left;   MELANOMA EXCISION WITH SENTINEL LYMPH NODE BIOPSY Right 2021   SEPTOPLASTY  05/13/2023   Dr Marcheta Grammes   SPINAL FUSION W/ LUQUE UNIT ROD  2003    Family History  Problem Relation Age of Onset   Prostate cancer Father        dx 46s   Breast cancer Maternal Aunt        dx after 1; bilateral mastectomy   Stomach cancer Maternal Grandfather        dx 28s   Breast cancer Cousin 48       maternal  male cousin; mets    Social History:  reports that he quit smoking about 27 years ago. His smoking use included cigarettes. He has never used smokeless tobacco. He reports current alcohol use of about 14.0 standard drinks of alcohol per week. He reports that he does not currently use drugs.The patient is alone today.  Allergies:  Allergies  Allergen Reactions   Sulfa Antibiotics Rash   Testosterone Itching    Gel    Current Medications: Current Outpatient Medications  Medication Sig Dispense Refill   acebutolol (SECTRAL) 200 MG capsule TAKE ONE CAPSULE BY MOUTH EVERY DAY 90 capsule 1   acyclovir (ZOVIRAX) 400 MG tablet Take 400 mg by mouth 3 (three) times daily.     amitriptyline (ELAVIL) 25 MG tablet Take 50 mg by mouth at bedtime.     ergocalciferol (VITAMIN D2) 1.25 MG (50000 UT) capsule Take 50,000 Units by mouth every Wednesday.     folic acid (FOLVITE) 1 MG tablet Take 1 tablet (1 mg total) by mouth daily. 30 tablet 5   HYDROcodone-acetaminophen (NORCO/VICODIN) 5-325 MG tablet Take 1 tablet by mouth every 4 (four) hours as needed for moderate pain (pain score 4-6). 150 tablet 0   hydrOXYzine (ATARAX) 25 MG tablet TAKE ONE TABLET BY MOUTH 3 TIMES A DAY AS NEEDED FOR ITCHING 60 tablet 3   lidocaine-prilocaine (EMLA) cream Apply 1 Application topically as needed. 30 g 0   lisinopril (ZESTRIL) 20 MG tablet Take 1 tablet (20 mg total) by  mouth daily. 30 tablet 1   pentoxifylline (TRENTAL) 400 MG CR tablet Take 1 tablet (400 mg total) by mouth in the morning and at bedtime. 60 tablet 3   pregabalin (LYRICA) 75 MG capsule Take 1 capsule (75 mg total) by mouth 3 (three) times daily. 90 capsule 5   vitamin E 180 MG (400 UNITS) capsule Take 1 capsule (400 Units total) by mouth daily. 30 capsule 2   XARELTO 20 MG TABS tablet TAKE ONE TABLET BY MOUTH EVERY DAY WITH SUPPER. START AFTER 3 WEEKS OF 15MG 2 TIMES A DAY 30 tablet 3   No current facility-administered medications for this visit.   Facility-Administered Medications Ordered in Other Visits  Medication Dose Route Frequency Provider Last Rate Last Admin   heparin lock flush 100 unit/mL  500 Units Intracatheter Once PRN Nolia Baumgartner, MD       nivolumab (OPDIVO) 480 mg in sodium chloride 0.9 % 100 mL chemo infusion  480 mg Intravenous Once Nolia Baumgartner, MD       sodium chloride flush (NS) 0.9 % injection 10 mL  10 mL Intracatheter PRN Nolia Baumgartner, MD

## 2023-10-22 ENCOUNTER — Telehealth: Payer: Self-pay | Admitting: Hematology and Oncology

## 2023-10-22 ENCOUNTER — Other Ambulatory Visit: Payer: Self-pay | Admitting: Oncology

## 2023-10-22 ENCOUNTER — Inpatient Hospital Stay

## 2023-10-22 ENCOUNTER — Encounter: Payer: Self-pay | Admitting: Hematology and Oncology

## 2023-10-22 ENCOUNTER — Inpatient Hospital Stay (HOSPITAL_BASED_OUTPATIENT_CLINIC_OR_DEPARTMENT_OTHER): Admitting: Hematology and Oncology

## 2023-10-22 ENCOUNTER — Inpatient Hospital Stay: Attending: Hematology and Oncology

## 2023-10-22 VITALS — BP 129/88 | HR 63 | Temp 98.6°F | Resp 18 | Ht 69.0 in | Wt 174.6 lb

## 2023-10-22 DIAGNOSIS — C4361 Malignant melanoma of right upper limb, including shoulder: Secondary | ICD-10-CM | POA: Diagnosis not present

## 2023-10-22 DIAGNOSIS — C439 Malignant melanoma of skin, unspecified: Secondary | ICD-10-CM | POA: Diagnosis not present

## 2023-10-22 DIAGNOSIS — C792 Secondary malignant neoplasm of skin: Secondary | ICD-10-CM

## 2023-10-22 DIAGNOSIS — G629 Polyneuropathy, unspecified: Secondary | ICD-10-CM

## 2023-10-22 DIAGNOSIS — C7931 Secondary malignant neoplasm of brain: Secondary | ICD-10-CM

## 2023-10-22 DIAGNOSIS — M5126 Other intervertebral disc displacement, lumbar region: Secondary | ICD-10-CM

## 2023-10-22 DIAGNOSIS — Z87891 Personal history of nicotine dependence: Secondary | ICD-10-CM | POA: Diagnosis not present

## 2023-10-22 DIAGNOSIS — C7800 Secondary malignant neoplasm of unspecified lung: Secondary | ICD-10-CM

## 2023-10-22 DIAGNOSIS — M79604 Pain in right leg: Secondary | ICD-10-CM

## 2023-10-22 DIAGNOSIS — Z79620 Long term (current) use of immunosuppressive biologic: Secondary | ICD-10-CM | POA: Insufficient documentation

## 2023-10-22 DIAGNOSIS — C787 Secondary malignant neoplasm of liver and intrahepatic bile duct: Secondary | ICD-10-CM

## 2023-10-22 DIAGNOSIS — Z5112 Encounter for antineoplastic immunotherapy: Secondary | ICD-10-CM | POA: Diagnosis not present

## 2023-10-22 LAB — CMP (CANCER CENTER ONLY)
ALT: 7 U/L (ref 0–44)
AST: 15 U/L (ref 15–41)
Albumin: 4.1 g/dL (ref 3.5–5.0)
Alkaline Phosphatase: 80 U/L (ref 38–126)
Anion gap: 8 (ref 5–15)
BUN: 20 mg/dL (ref 6–20)
CO2: 29 mmol/L (ref 22–32)
Calcium: 9.3 mg/dL (ref 8.9–10.3)
Chloride: 105 mmol/L (ref 98–111)
Creatinine: 0.75 mg/dL (ref 0.61–1.24)
GFR, Estimated: >60 mL/min (ref >60)
Glucose, Bld: 94 mg/dL (ref 70–99)
Potassium: 4.1 mmol/L (ref 3.5–5.1)
Sodium: 142 mmol/L (ref 135–145)
Total Bilirubin: 0.4 mg/dL (ref 0.0–1.2)
Total Protein: 6.3 g/dL — ABNORMAL LOW (ref 6.5–8.1)

## 2023-10-22 LAB — CBC WITH DIFFERENTIAL (CANCER CENTER ONLY)
Abs Immature Granulocytes: 0.02 10*3/uL (ref 0.00–0.07)
Basophils Absolute: 0.1 10*3/uL (ref 0.0–0.1)
Basophils Relative: 1 %
Eosinophils Absolute: 0.1 10*3/uL (ref 0.0–0.5)
Eosinophils Relative: 3 %
HCT: 34.7 % — ABNORMAL LOW (ref 39.0–52.0)
Hemoglobin: 11.9 g/dL — ABNORMAL LOW (ref 13.0–17.0)
Immature Granulocytes: 0 %
Lymphocytes Relative: 33 %
Lymphs Abs: 1.5 10*3/uL (ref 0.7–4.0)
MCH: 31.7 pg (ref 26.0–34.0)
MCHC: 34.3 g/dL (ref 30.0–36.0)
MCV: 92.5 fL (ref 80.0–100.0)
Monocytes Absolute: 0.5 10*3/uL (ref 0.1–1.0)
Monocytes Relative: 10 %
Neutro Abs: 2.4 10*3/uL (ref 1.7–7.7)
Neutrophils Relative %: 53 %
Platelet Count: 251 10*3/uL (ref 150–400)
RBC: 3.75 MIL/uL — ABNORMAL LOW (ref 4.22–5.81)
RDW: 13 % (ref 11.5–15.5)
WBC Count: 4.6 10*3/uL (ref 4.0–10.5)
nRBC: 0 % (ref 0.0–0.2)
nRBC: 0 /100{WBCs}

## 2023-10-22 LAB — TSH: TSH: 2.297 u[IU]/mL (ref 0.350–4.500)

## 2023-10-22 MED ORDER — HEPARIN SOD (PORK) LOCK FLUSH 100 UNIT/ML IV SOLN
500.0000 [IU] | Freq: Once | INTRAVENOUS | Status: AC | PRN
Start: 2023-10-22 — End: 2023-10-22
  Administered 2023-10-22: 500 [IU]

## 2023-10-22 MED ORDER — SODIUM CHLORIDE 0.9% FLUSH
10.0000 mL | INTRAVENOUS | Status: DC | PRN
  Administered 2023-10-22: 10 mL

## 2023-10-22 MED ORDER — HYDROCODONE-ACETAMINOPHEN 5-325 MG PO TABS
1.0000 | ORAL_TABLET | ORAL | 0 refills | Status: DC | PRN
Start: 2023-10-22 — End: 2023-12-29

## 2023-10-22 MED ORDER — SODIUM CHLORIDE 0.9 % IV SOLN: Freq: Once | INTRAVENOUS | Status: AC

## 2023-10-22 MED ORDER — SODIUM CHLORIDE 0.9 % IV SOLN
480.0000 mg | Freq: Once | INTRAVENOUS | Status: AC
  Administered 2023-10-22: 480 mg via INTRAVENOUS
  Filled 2023-10-22: qty 48

## 2023-10-22 NOTE — Assessment & Plan Note (Signed)
 He presented with a new area of concern of the left mid back in June 2023.  A superficial nodule was noted approximately 1 cm to the right of his upper thoracic spine,and it had increased in size. Biopsy which revealed metastatic malignant melanoma.   He had several hypermetabolic subcutaneous nodules seen on PET, which have resolved with treatment. There are no new skin lesions.

## 2023-10-22 NOTE — Assessment & Plan Note (Signed)
 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  There was still a question as to whether this represented a metastatic lesion, rather than a primary, based on the presence of 2 nodules and no epidermal involvement.  MRI head and CT chest, abdomen and pelvis in June 2021 were negative for metastatic disease.   No adjuvant therapy was recommended.  PET scan in September was negative for metastatic disease.    In June 2023, he was found to have widespread metastatic disease, including to the brain.  CT scans in January 2024 revealed resolved subcarinal and bilateral hilar lymphadenopathy. Stable to decreased left pulmonary nodules. No new or progressive metastatic disease in the chest, abdomen or pelvis. Most recent CT scans in July 2024 stable. We will plan to repeat imaging in July 2025 as long as he remains stable.

## 2023-10-22 NOTE — Patient Instructions (Signed)
 [facility] [place] - A DEPT OF [facility]  Discharge Instructions: Thank you for choosing [facility] to provide your oncology and hematology care.  If you have a lab appointment with the Cancer Center, please go directly to the Cancer Center and check in at the registration area.   Wear comfortable clothing and clothing appropriate for easy access to any Portacath or PICC line.   We strive to give you quality time with your provider. You may need to reschedule your appointment if you arrive late (15 or more minutes).  Arriving late affects you and other patients whose appointments are after yours.  Also, if you miss three or more appointments without notifying the office, you may be dismissed from the clinic at the provider's discretion.      For prescription refill requests, have your pharmacy contact our office and allow 72 hours for refills to be completed.    Today you received the following chemotherapy and/or immunotherapy agents Nivolumab      To help prevent nausea and vomiting after your treatment, we encourage you to take your nausea medication as directed.  BELOW ARE SYMPTOMS THAT SHOULD BE REPORTED IMMEDIATELY: *FEVER GREATER THAN 100.4 F (38 C) OR HIGHER *CHILLS OR SWEATING *NAUSEA AND VOMITING THAT IS NOT CONTROLLED WITH YOUR NAUSEA MEDICATION *UNUSUAL SHORTNESS OF BREATH *UNUSUAL BRUISING OR BLEEDING *URINARY PROBLEMS (pain or burning when urinating, or frequent urination) *BOWEL PROBLEMS (unusual diarrhea, constipation, pain near the anus) TENDERNESS IN MOUTH AND THROAT WITH OR WITHOUT PRESENCE OF ULCERS (sore throat, sores in mouth, or a toothache) UNUSUAL RASH, SWELLING OR PAIN  UNUSUAL VAGINAL DISCHARGE OR ITCHING   Items with * indicate a potential emergency and should be followed up as soon as possible or go to the Emergency Department if any problems should occur.  Please show the CHEMOTHERAPY ALERT CARD or IMMUNOTHERAPY ALERT  CARD at check-in to the Emergency Department and triage nurse.  Should you have questions after your visit or need to cancel or reschedule your appointment, please contact [facility] [place] - A DEPT OF [facility]  Dept: [phone]  and follow the prompts.  Office hours are 8:00 a.m. to 4:30 p.m. Monday - Friday. Please note that voicemails left after 4:00 p.m. may not be returned until the following business day.  We are closed weekends and major holidays. You have access to a nurse at all times for urgent questions. Please call the main number to the clinic Dept: [phone] and follow the prompts.  For any non-urgent questions, you may also contact your provider using MyChart. We now offer e-Visits for anyone 63 and older to request care online for non-urgent symptoms. For details visit [email or web address].   Also download the MyChart app! Go to the app store, search "MyChart", open the app, select [facility], and log in with your MyChart username and password.

## 2023-10-22 NOTE — Assessment & Plan Note (Addendum)
 He had bilateral hypermetabolic pulmonary nodules which increased in size by the time treatment was started. The largest nodule is up to 2.3 cm in diameter. CT in January 2024 revealed a good response. Most recent CT chest in July showed 2 tiny 4 mm nodules, which were persistent and unchanged.  No new disease was seen.  He has continued maintenance nivolumab.  He is doing well at this time, so we will proceed with a 23rd cycle of nivolumab today.  We will plan to see him back in 4 weeks with a CBC, comprehensive metabolic panel and TSH prior to 24th cycle.

## 2023-10-22 NOTE — Telephone Encounter (Signed)
 Patient has been scheduled for follow-up visit per 10/22/23 (May and June appts scheduled) LOS.  Pt given an appt calendar with date and time.

## 2023-10-22 NOTE — Assessment & Plan Note (Signed)
 This was suspicious on the PET scan in the left frontoparietal lobe.  MRI of the brain confirmed a 2.6 cm heterogeneous lesion of the left superior temporal gyrus with mild enhancement and extensive surrounding edema.  He also has an 8 mm cortical lesion of the inferomedial right frontal lobe with mild edema, a 2 mm mildly enhancing cortical lesion of the inferolateral left frontal lobe and a intrinsically T1 hyperintense lesion of the left lateral pons measuring 7 mm with surrounding edema.  He has 4 lesions in total, but 3 are less than 1 cm.  He was treated with surgical resection of the larger lesion and stereotactic radiation.    He had recurrent neurologic symptoms with left-sided facial droop and left-sided weakness in September 2023 and was placed on dexamethasone 4 mg 3 times daily.  Brain revealed thin linear enhancement along the resection margin of the left temporal resection cavity, which may be postoperative, with additional small areas of somewhat more nodular enhancement, which are nonspecific, but could indicate recurrent disease. Attention on follow-up. Interval increase in the size of the right inferior frontal gyrus enhancing lesion with increased surrounding edema, mild local mass effect, and mild local midline shift. Stable to slightly decreased size of the left pontine lesion with decreased associated edema. Previously noted left inferior frontal gyrus lesion is no longer visualized. No definite new lesion.  Dr. Mark Sil slowly weaned him off dexamethasone.  MRI brain in December 2023 was stable.  He has had persistent left facial weakness.   MRI brain in March revealed increased size of a hemorrhagic left pons/middle cerebellar peduncle lesion with increased surrounding edema, similar versus slightly decreased size of a 13 x 10 mm enhancing lesion in the medial right frontal lobe, and mildly diminished intrinsic T1 hyperintensity of the left temporal resection site with linear enhancement  along the lateral margin, probably postsurgical/post radiation.  PET brain in April did not show hyperactivity, but more radiation necrosis, so these changes are more consistent with posttreatment changes.  MRI head in August revealed new punctate superior right frontal lobe enhancement compatible with interval metastasis, interval regression of enhancement and edema in the left brachium pontis and pons. There was also a vasogenic edema at a pre-existing right frontal metastasis with underlying lesion enhancement remaining stable.  Dr. Mark Sil recommended continued observation and saw him in December 2024 with a repeat MRI brain.  This looks quite good and the punctate lesion seen on the last scan has resolved.  We see no new lesions and overall the scan looks good, so Dr. Mark Sil will repeat it 4 months this time.  This was suspicious on the PET scan in the left frontoparietal lobe.  MRI of the brain confirmed a 2.6 cm heterogeneous lesion of the left superior temporal gyrus with mild enhancement and extensive surrounding edema.  He also has an 8 mm cortical lesion of the inferomedial right frontal lobe with mild edema, a 2 mm mildly enhancing cortical lesion of the inferolateral left frontal lobe and a intrinsically T1 hyperintense lesion of the left lateral pons measuring 7 mm with surrounding edema.  He has 4 lesions in total, but 3 are less than 1 cm.  He was treated with surgical resection of the larger lesion and stereotactic radiation.    He had recurrent neurologic symptoms with left-sided facial droop and left-sided weakness in September 2023 and was placed on dexamethasone 4 mg 3 times daily.  Brain revealed thin linear enhancement along the resection margin of the  left temporal resection cavity, which may be postoperative, with additional small areas of somewhat more nodular enhancement, which are nonspecific, but could indicate recurrent disease. Attention on follow-up. Interval increase in the size of  the right inferior frontal gyrus enhancing lesion with increased surrounding edema, mild local mass effect, and mild local midline shift. Stable to slightly decreased size of the left pontine lesion with decreased associated edema. Previously noted left inferior frontal gyrus lesion is no longer visualized. No definite new lesion.  Dr. Mark Sil slowly weaned him off dexamethasone.  MRI brain in December 2023 was stable.  He has had persistent left facial weakness.   MRI brain in March revealed increased size of a hemorrhagic left pons/middle cerebellar peduncle lesion with increased surrounding edema, similar versus slightly decreased size of a 13 x 10 mm enhancing lesion in the medial right frontal lobe, and mildly diminished intrinsic T1 hyperintensity of the left temporal resection site with linear enhancement along the lateral margin, probably postsurgical/post radiation.  PET brain in April did not show hyperactivity, but more radiation necrosis, so these changes are more consistent with posttreatment changes.  MRI head in August revealed new punctate superior right frontal lobe enhancement compatible with interval metastasis, interval regression of enhancement and edema in the left brachium pontis and pons. There was also a vasogenic edema at a pre-existing right frontal metastasis with underlying lesion enhancement remaining stable.  Dr. Mark Sil recommended continued observation.  He has had chronic lightheadedness, hearing loss, and tinnitus that has been going on since radiation to the head, which worsens with loud noise, and he works in a loud environment. He has seen a ENT about this to no avail. This is felt to be due to radiation.  Dr. Mark Sil saw him in December 2024 with a repeat MRI brain.  This looked quite good and the punctate lesion seen on the last scan had resolved.  There were no new lesions and overall the scan looked good.  He has a repeat MRI brain on April 18 and sees Dr. Mark Sil on April  21.

## 2023-10-23 ENCOUNTER — Ambulatory Visit
Admission: RE | Admit: 2023-10-23 | Discharge: 2023-10-23 | Disposition: A | Discharge status: Home / Self Care | Payer: Medicare Other | Source: Ambulatory Visit | Attending: Internal Medicine

## 2023-10-23 DIAGNOSIS — C7931 Secondary malignant neoplasm of brain: Secondary | ICD-10-CM | POA: Diagnosis not present

## 2023-10-23 DIAGNOSIS — C439 Malignant melanoma of skin, unspecified: Secondary | ICD-10-CM | POA: Diagnosis not present

## 2023-10-23 LAB — T4: T4, Total: 6.4 ug/dL (ref 4.5–12.0)

## 2023-10-23 MED ORDER — GADOPICLENOL 0.5 MMOL/ML IV SOLN
8.0000 mL | Freq: Once | INTRAVENOUS | Status: AC | PRN
  Administered 2023-10-23: 8 mL via INTRAVENOUS

## 2023-10-23 MED ORDER — HEPARIN SOD (PORK) LOCK FLUSH 100 UNIT/ML IV SOLN
500.0000 [IU] | Freq: Once | INTRAVENOUS | Status: AC
  Administered 2023-10-23: 500 [IU] via INTRAVENOUS

## 2023-10-26 ENCOUNTER — Inpatient Hospital Stay (HOSPITAL_BASED_OUTPATIENT_CLINIC_OR_DEPARTMENT_OTHER): Payer: 59 | Admitting: Internal Medicine

## 2023-10-26 ENCOUNTER — Telehealth: Payer: Self-pay | Admitting: Internal Medicine

## 2023-10-26 VITALS — BP 121/83 | HR 66 | Temp 98.5°F | Resp 18 | Wt 173.0 lb

## 2023-10-26 DIAGNOSIS — C7800 Secondary malignant neoplasm of unspecified lung: Secondary | ICD-10-CM | POA: Diagnosis not present

## 2023-10-26 DIAGNOSIS — Z79620 Long term (current) use of immunosuppressive biologic: Secondary | ICD-10-CM | POA: Diagnosis not present

## 2023-10-26 DIAGNOSIS — Z5112 Encounter for antineoplastic immunotherapy: Secondary | ICD-10-CM | POA: Diagnosis not present

## 2023-10-26 DIAGNOSIS — C4361 Malignant melanoma of right upper limb, including shoulder: Secondary | ICD-10-CM | POA: Diagnosis not present

## 2023-10-26 DIAGNOSIS — C7931 Secondary malignant neoplasm of brain: Secondary | ICD-10-CM | POA: Diagnosis not present

## 2023-10-26 DIAGNOSIS — Z87891 Personal history of nicotine dependence: Secondary | ICD-10-CM | POA: Diagnosis not present

## 2023-10-26 NOTE — Progress Notes (Signed)
 [facility] at [facility] [address]  [place], [place] [address] [phone]   Interval Evaluation  Date of Service: 10/26/23 Patient Name: Xavier Jones Patient MRN: [id number] Patient DOB: 03-10-1971 Provider: Mamie Searles, MD  Identifying Statement:  Xavier Jones is a 53 y.o. 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  Variants of uncertain significance detected in APC (c.681C>G (p.Asp227Glu)), CDKN1C (c.610C>G (p.Pro204Ala)), and FH (c.1151C>T (p.Ala384Val)).  The report date is February 18, 2021.    The Multi-Cancer + RNA Panel offered by [facility] includes sequencing and/or deletion/duplication analysis of the following 84 genes:  AIP*, ALK, APC*, ATM*, AXIN2*, BAP1*, BARD1*, BLM*, BMPR1A*, BRCA1*, BRCA2*, BRIP1*, CASR, CDC73*, CDH1*, CDK4, CDKN1B*, CDKN1C*, CDKN2A, CEBPA, CHEK2*, CTNNA1*, DICER1*, DIS3L2*, EGFR, EPCAM, FH*, FLCN*, GATA2*, GPC3, GREM1, HOXB13, HRAS, KIT, MAX*, MEN1*, MET, MITF, MLH1*, MSH2*, MSH3*, MSH6*, MUTYH*, NBN*, NF1*, NF2*, NTHL1*, PALB2*, PDGFRA, PHOX2B, PMS2*, POLD1*, POLE*, POT1*, PRKAR1A*, PTCH1*, PTEN*, RAD50*, RAD51C*, RAD51D*, RB1*, RECQL4, RET, RUNX1*, SDHA*, SDHAF2*, SDHB*, SDHC*, SDHD*, SMAD4*, SMARCA4*, SMARCB1*, SMARCE1*, STK11*, SUFU*, TERC, TERT, TMEM127*, Tp53*, TSC1*, TSC2*, VHL*, WRN*, and WT1.  RNA analysis is performed for * genes.   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 (HCC)   01/30/2022 - 02/20/2022 Chemotherapy   Patient is on Treatment Plan : MELANOMA Nivolumab  + Ipilimumab  (1/3) q21d / Nivolumab  q28d     01/30/2022 -  Chemotherapy   Patient is on Treatment Plan : MELANOMA Nivolumab  (480) q28d     Malignant melanoma metastatic to lung (HCC)  12/19/2021 Initial Diagnosis   Malignant melanoma metastatic to lung (HCC)   01/30/2022 - 02/20/2022 Chemotherapy   Patient is on Treatment Plan : MELANOMA Nivolumab  + Ipilimumab  (1/3) q21d / Nivolumab  q28d     01/30/2022 -  Chemotherapy   Patient is on Treatment Plan : MELANOMA Nivolumab  (480) q28d     03/24/2022 Imaging   CTA chest:  IMPRESSION:  1. Small nonocclusive segmental level pulmonary embolism in the  inferior LEFT upper lobe.  2. Marked bronchial wall thickening with areas of patchy basilar  opacities and new ground-glass opacities in the upper lobes.  Constellation of findings may be infectious bronchiolitis/bronchitis  though the possibility of drug related changes in the setting of  immunotherapy are also considered. Furthermore, given basilar  predominance and some material in basilar bronchial structures would  correlate with any risk factors for or history of aspiration.  3. Response to therapy of bilateral metastatic lesions. Some lymph  nodes in the chest with interval increase in size are equivocal at  this time, potentially reactive, but warrant attention on subsequent  imaging.    07/22/2022 Imaging   CT chest, abdomen and pelvis:  IMPRESSION:  1. Interval positive  response to therapy. Resolved subcarinal and  bilateral hilar lymphadenopathy. Stable to decreased left pulmonary  nodules. No new or progressive metastatic disease in the chest,  abdomen or pelvis.  2. One vessel coronary  atherosclerosis.  3. Aortic Atherosclerosis (ICD10-I70.0).    Malignant neoplasm metastatic to liver (HCC)  12/19/2021 Initial Diagnosis   Malignant neoplasm metastatic to liver (HCC)   01/30/2022 - 02/20/2022 Chemotherapy   Patient is on Treatment Plan : MELANOMA Nivolumab  + Ipilimumab  (1/3) q21d / Nivolumab  q28d     01/30/2022 -  Chemotherapy   Patient is on Treatment Plan : MELANOMA Nivolumab  (480) q28d     07/22/2022 Imaging   CT chest, abdomen and pelvis:  IMPRESSION:  1. Interval positive response to therapy. Resolved subcarinal and  bilateral hilar lymphadenopathy. Stable to decreased left pulmonary  nodules. No new or progressive metastatic disease in the chest,  abdomen or pelvis.  2. One vessel coronary atherosclerosis.  3. Aortic Atherosclerosis (ICD10-I70.0).     CNS Oncologic History 01/02/22: SRS to 4 metastases, including pre-op L temporal Xavier Jones) 01/03/22: Craniotomy, resection L temporal Xavier Jones)  Interval History: Xavier Jones presents for follow up after recent MRI brain.  Still with hearing impairment/loss and aural fullness on the left side.  His left sided facial weakness is unchanged for the most part, continues to have some fasciculations asp rior.  He remains unable to close his eyelid.  He is using saline drops to keep the eye moist.  No longer dosing decadron .  Neuropathy symptoms are much improved in Lyrica  75mg  TID.  Continues on nivolumab  with Dr. Almer Jacobson, most recently was infused last week.  H+P (03/31/22) Patient presents today after 3 month post-SRS and craniotomy follow up.  He developed left sided weakness, mainly facial droop, 1 week ago.  Denies arm or leg weakness during that time.  He was started on decadron , dosed 4mg  TID x3 days, then down to 4mg  daily thereafter.  His weakness has improved back to near baseline.  He has no other complaints today, no seizures, no issues with gait.  Medications: Current Outpatient Medications on File Prior to  Visit  Medication Sig Dispense Refill   acebutolol  (SECTRAL ) 200 MG capsule TAKE ONE CAPSULE BY MOUTH EVERY DAY 90 capsule 1   acyclovir  (ZOVIRAX ) 400 MG tablet Take 400 mg by mouth 3 (three) times daily.     amitriptyline  (ELAVIL ) 25 MG tablet Take 50 mg by mouth at bedtime.     ergocalciferol (VITAMIN D2) 1.25 MG (50000 UT) capsule Take 50,000 Units by mouth every Wednesday.     folic acid  (FOLVITE ) 1 MG tablet Take 1 tablet (1 mg total) by mouth daily. 30 tablet 5   HYDROcodone -acetaminophen  (NORCO/VICODIN) 5-325 MG tablet Take 1 tablet by mouth every 4 (four) hours as needed for moderate pain (pain score 4-6). 150 tablet 0   hydrOXYzine  (ATARAX ) 25 MG tablet TAKE ONE TABLET BY MOUTH 3 TIMES A DAY AS NEEDED FOR ITCHING 60 tablet 3   lidocaine -prilocaine  (EMLA ) cream Apply 1 Application topically as needed. 30 g 0   lisinopril  (ZESTRIL ) 20 MG tablet Take 1 tablet (20 mg total) by mouth daily. 30 tablet 1   pentoxifylline  (TRENTAL ) 400 MG CR tablet Take 1 tablet (400 mg total) by mouth in the morning and at bedtime. 60 tablet 3   pregabalin  (LYRICA ) 75 MG capsule Take 1 capsule (75 mg total) by mouth 3 (three) times daily. 90 capsule 5   vitamin E  180 MG (400 UNITS) capsule Take  1 capsule (400 Units total) by mouth daily. 30 capsule 2   XARELTO  20 MG TABS tablet TAKE ONE TABLET BY MOUTH EVERY DAY WITH SUPPER. START AFTER 3 WEEKS OF 15MG 2 TIMES A DAY 30 tablet 3   No current facility-administered medications on file prior to visit.    Allergies:  Allergies  Allergen Reactions   Sulfa Antibiotics Rash   Testosterone  Itching    Gel   Past Medical History:  Past Medical History:  Diagnosis Date   Acute pulmonary embolism without acute cor pulmonale (HCC) 05/01/2022   Anemia 05/20/2022   Depression    mild   Family history of breast cancer 01/22/2021   Family history of prostate cancer 01/22/2021   Genital herpes    History of back injury 2003   Pruritic rash 02/18/2022   Skin  cancer    melanoma   Past Surgical History:  Past Surgical History:  Procedure Laterality Date   APPLICATION OF CRANIAL NAVIGATION N/A 01/03/2022   Procedure: APPLICATION OF CRANIAL NAVIGATION;  Surgeon: Augusto Blonder, MD;  Location: [facility] OR;  Service: Neurosurgery;  Laterality: N/A;   CRANIOTOMY Left 01/03/2022   Procedure: Stereotactic Left temporal craniotomy for resection of tumor;  Surgeon: Augusto Blonder, MD;  Location: [facility] OR;  Service: Neurosurgery;  Laterality: Left;   MELANOMA EXCISION WITH SENTINEL LYMPH NODE BIOPSY Right 2021   SEPTOPLASTY  05/13/2023   Dr Leveda Rea   SPINAL FUSION W/ LUQUE UNIT ROD  2003   Social History:  Social History   Socioeconomic History   Marital status: Divorced    Spouse name: Not on file   Number of children: 0   Years of education: Not on file   Highest education level: Not on file  Occupational History   Not on file  Tobacco Use   Smoking status: Former    Current packs/day: 0.00    Types: Cigarettes    Quit date: 1998    Years since quitting: 27.3   Smokeless tobacco: Never  Vaping Use   Vaping status: Never Used  Substance and Sexual Activity   Alcohol use: Yes    Alcohol/week: 14.0 standard drinks of alcohol    Types: 14 Cans of beer per week    Comment: 2-3 beers per evening   Drug use: Not Currently   Sexual activity: Not on file  Other Topics Concern   Not on file  Social History Narrative   Not on file   Social Drivers of Health   Financial Resource Strain: Not on file  Food Insecurity: Not on file  Transportation Needs: Not on file  Physical Activity: Not on file  Stress: Not on file  Social Connections: Not on file  Intimate Partner Violence: Not on file   Family History:  Family History  Problem Relation Age of Onset   Prostate cancer Father        dx 64s   Breast cancer Maternal Aunt        dx after 32; bilateral mastectomy   Stomach cancer Maternal Grandfather        dx 21s   Breast cancer Cousin  65       maternal male cousin; mets    Review of Systems: Constitutional: Doesn't report fevers, chills or abnormal weight loss Eyes: Doesn't report blurriness of vision Ears, nose, mouth, throat, and face: Doesn't report sore throat Respiratory: Doesn't report cough, dyspnea or wheezes Cardiovascular: Doesn't report palpitation, chest discomfort  Gastrointestinal:  Doesn't report nausea, constipation, diarrhea GU:  Doesn't report incontinence Skin: Doesn't report skin rashes Neurological: Per HPI Musculoskeletal: Doesn't report joint pain Behavioral/Psych: Doesn't report anxiety  Physical Exam: Vitals:   10/26/23 1021  BP: 121/83  Pulse: 66  Resp: 18  Temp: 98.5 F (36.9 C)  SpO2: 98%     KPS: 80. General: Alert, cooperative, pleasant, in no acute distress Head: Normal EENT: No conjunctival injection or scleral icterus.  Lungs: Resp effort normal Cardiac: Regular rate Abdomen: Non-distended abdomen Skin: No rashes cyanosis or petechiae. Extremities: No clubbing or edema  Neurologic Exam: Mental Status: Awake, alert, attentive to examiner. Oriented to self and environment. Language is fluent with intact comprehension.  Cranial Nerves: Visual acuity is grossly normal. Visual fields are full. Extra-ocular movements intact. L LMN facial paresis, dense Motor: Tone and bulk are normal. Power is full in both arms and legs. Reflexes are symmetric, no pathologic reflexes present.  Sensory: Intact to light touch Gait: Normal.   Labs: I have reviewed the data as listed    Component Value Date/Time   NA 142 10/22/2023 0830   NA 143 01/05/2023 0000   K 4.1 10/22/2023 0830   CL 105 10/22/2023 0830   CO2 29 10/22/2023 0830   GLUCOSE 94 10/22/2023 0830   BUN 20 10/22/2023 0830   BUN 9 01/05/2023 0000   CREATININE 0.75 10/22/2023 0830   CALCIUM 9.3 10/22/2023 0830   PROT 6.3 (L) 10/22/2023 0830   PROT 6.2 (A) 05/08/2022 0000   ALBUMIN 4.1 10/22/2023 0830   AST 15  10/22/2023 0830   ALT 7 10/22/2023 0830   ALKPHOS 80 10/22/2023 0830   BILITOT 0.4 10/22/2023 0830   GFRNONAA >60 10/22/2023 0830   Lab Results  Component Value Date   WBC 4.6 10/22/2023   NEUTROABS 2.4 10/22/2023   HGB 11.9 (L) 10/22/2023   HCT 34.7 (L) 10/22/2023   MCV 92.5 10/22/2023   PLT 251 10/22/2023    Imaging:  No results found.  [facility] Clinician Interpretation: I have personally reviewed the radiological images as listed.  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  MRI brain demonstrates stable findings, pending official read.  Ok with continued imaging sureveillance only.  We appreciate the opportunity to participate in the care of [facility].   May con't Lyrica  75mg  TID.  We also ask that Terald Jump return to clinic in 6 months following next brain MRI, or sooner as needed.  He will continue to follow with Dr. Almer Jacobson for nivolumab   All questions were answered. The patient knows to call the clinic with any problems, questions or concerns. No barriers to learning were detected.  The total time spent in the encounter was 40 minutes and more than 50% was on counseling and review of test results   Mamie Searles, MD Medical Director of Neuro-Oncology [facility] at [place] 10/26/23 10:54 AM

## 2023-10-26 NOTE — Telephone Encounter (Signed)
 Patient scheduled appointments. Patient is aware of all appointment details.

## 2023-10-27 ENCOUNTER — Other Ambulatory Visit: Payer: Self-pay

## 2023-11-02 ENCOUNTER — Other Ambulatory Visit: Payer: Self-pay | Admitting: Hematology and Oncology

## 2023-11-04 ENCOUNTER — Other Ambulatory Visit: Payer: Self-pay | Admitting: Radiation Therapy

## 2023-11-06 ENCOUNTER — Encounter: Payer: Self-pay | Admitting: Oncology

## 2023-11-19 NOTE — Progress Notes (Signed)
 [facility]  [address] [place],  [place]  [address] [phone]  Clinic Day:  11/20/23  Referring physician: Barney Boozer, NP  ASSESSMENT & PLAN:  Assessment: Melanoma of skin (HCC) Stage IIB malignant melanoma of the right posterior shoulder, Breslow's depth 3.3 mm, Clark level IV, treated with wide local excision, diagnosed in May 2021.  There was still a question as to whether this represented a metastatic lesion, rather than a primary, based on the presence of 2 nodules and no epidermal involvement.  MRI head and CT chest, abdomen and pelvis in June 2021 were negative for metastatic disease.   No adjuvant therapy was recommended.  PET scan in September was negative for metastatic disease.     In June 2023, he was found to have widespread metastatic disease, including to the brain.  He was placed on immunotherapy and CT scans in January 2024 revealed resolved subcarinal and bilateral hilar lymphadenopathy, stable to decreased left pulmonary nodules, and no new or progressive metastatic disease in the chest, abdomen or pelvis. Most recent CT scans in July 2024 stable. We will plan to repeat imaging in July 2025 as long as he remains stable.   Malignant melanoma metastatic to brain [facility]) This was suspicious on the PET scan in the left frontoparietal lobe.  MRI of the brain confirmed a 2.6 cm heterogeneous lesion of the left superior temporal gyrus with mild enhancement and extensive surrounding edema.  He also had an 8 mm cortical lesion of the inferomedial right frontal lobe with mild edema, a 2 mm mildly enhancing cortical lesion of the inferolateral left frontal lobe and a lesion of the left lateral pons measuring 7 mm with surrounding edema.  He has 4 lesions in total, but 3 are less than 1 cm.  He was treated with surgical resection of the larger lesion and stereotactic radiation.    He had recurrent neurologic symptoms with left-sided facial droop and left-sided weakness in  September 2023 and was placed on dexamethasone  4 mg 3 times daily.  Brain MRI revealed thin linear enhancement along the resection margin of the left temporal resection cavity, which may be postoperative, with additional small areas of somewhat more nodular enhancement, which are nonspecific, but could indicate recurrent disease. There was stable to slightly decreased size of the left pontine lesion with decreased associated edema. Previously noted left inferior frontal gyrus lesion is no longer visualized. No definite new lesion.  Dr. Mark Sil slowly weaned him off dexamethasone .  MRI brain in December 2023 was stable.  He has had persistent left facial weakness.     MRI brain in March of 2024 revealed increased size of a hemorrhagic left pons/middle cerebellar peduncle lesion with increased surrounding edema, similar versus slightly decreased size of a 13 x 10 mm enhancing lesion in the medial right frontal lobe, and mildly diminished intrinsic T1 hyperintensity of the left temporal resection site with linear enhancement along the lateral margin, probably postsurgical/post radiation.  PET brain in April did not show hyperactivity, but more radiation necrosis, so these changes are more consistent with posttreatment changes.  MRI head in August revealed new punctate superior right frontal lobe enhancement compatible with interval metastasis, interval regression of enhancement and edema in the left brachium pontis and pons. There was also a vasogenic edema at a pre-existing right frontal metastasis with underlying lesion enhancement remaining stable.  Dr. Mark Sil recommended continued observation. Dr. Mark Sil saw him in December 2024 with a repeat MRI brain.  This  looked quite good and the punctate lesion seen on the last scan had resolved.  There were no new lesions and overall the scan looked good.  MRI of the brain done on 10/23/2023 which revealed continued stable treated metastases of the left brainstem, right  inferior frontal gyrus, and left temporal lobe resection site. Additional punctate enhancement in the central left cerebellum associated with a rounded 8 mm area of FLAIR hyperintensity that has been there since August 2024 is stable, although larger than in 2023 and no new metastatic disease was also noted.   Malignant melanoma metastatic to lung [facility]) He had bilateral hypermetabolic pulmonary nodules which increased in size by the time treatment was started. The largest nodule is up to 2.3 cm in diameter. CT in January 2024 revealed a good response. Most recent CT chest in July showed 2 tiny 4 mm nodules, which were persistent and unchanged.  No new disease was seen.  He has continued maintenance nivolumab . He is doing well at this time, so we will proceed with a 24th cycle of nivolumab  today.    Malignant melanoma metastatic to skin [facility]) He presented with a new area of concern of the left mid back in June 2023.  A superficial nodule was noted approximately 1 cm to the right of his upper thoracic spine,and it had increased in size. Biopsy which revealed metastatic malignant melanoma.   He had several hypermetabolic subcutaneous nodules seen on PET, which have resolved with treatment. There are no new skin lesions.  Neuropathy He has severe pain of his right lower extremity and at least of this represent neuropathy and possible spinal nerve compression. It has responded to an increased dose of Lyrica  but he still requires oxycodone occasionally.    Plan: He continues Lyrica  75 mg TID. He states that his facial nerves are slowly starting to come back and he is able to twitch the left side of his lip now. He had a MRI of the brain done on 10/23/2023 which revealed continued stable treated metastases of the left brainstem, right inferior frontal gyrus, and left temporal lobe resection site. Additional punctate enhancement in the central left cerebellum associated with a rounded 8 mm area of FLAIR  hyperintensity that has been there since August 2024 is stable, although larger than in 2023 and no new metastatic disease was noted. His day 1 cycle 24 of Nivolumab  is scheduled on 11/20/2023. He has a WBC of 4.9, low hemoglobin of 12.2 improved from 11.9, and platelet count of 256,000. His CMP is normal other than a BUN of 21. His TSH and T4 levels are pending and I will add a uric acid level to his labs today to rule out gout of the toe. I will see him back in 4 weeks with CBC, CMP, TSH, and T4. We will plan repeat scans in July. The patient understands the plans discussed today and is in agreement with them.  He knows to contact our office if he develops concerns prior to his next appointment.  I provided 16 minutes of face-to-face time during this encounter and > 50% was spent counseling as documented under my assessment and plan.   Nolia Baumgartner, MD  [facility] - A DEPT OF [facility] [address] [place] [address] Dept: [phone] Dept Fax: [phone]   No orders of the defined types were placed in this encounter.   CHIEF COMPLAINT:  CC: Metastatic melanoma  Current Treatment:  Nivolumab  every 4 weeks  HISTORY OF PRESENT ILLNESS:   Oncology History  Melanoma of skin (HCC)  12/01/2019 Initial Diagnosis   Melanoma of skin (HCC)   12/05/2019 Cancer Staging   Staging form: Melanoma of the Skin, AJCC 8th Edition - Clinical stage from 12/05/2019: Stage IIA (cT3a, cN0, cM0) - Signed by Nolia Baumgartner, MD on 05/30/2020   02/18/2021 Genetic Testing   No pathogenic variants detected in [facility] Multi-Cancer +RNA Panel.  Variants of uncertain significance detected in APC (c.681C>G (p.Asp227Glu)), CDKN1C (c.610C>G (p.Pro204Ala)), and FH (c.1151C>T (p.Ala384Val)).  The report date is February 18, 2021.    The Multi-Cancer + RNA Panel offered by [facility] includes sequencing and/or deletion/duplication analysis of the  following 84 genes:  AIP*, ALK, APC*, ATM*, AXIN2*, BAP1*, BARD1*, BLM*, BMPR1A*, BRCA1*, BRCA2*, BRIP1*, CASR, CDC73*, CDH1*, CDK4, CDKN1B*, CDKN1C*, CDKN2A, CEBPA, CHEK2*, CTNNA1*, DICER1*, DIS3L2*, EGFR, EPCAM, FH*, FLCN*, GATA2*, GPC3, GREM1, HOXB13, HRAS, KIT, MAX*, MEN1*, MET, MITF, MLH1*, MSH2*, MSH3*, MSH6*, MUTYH*, NBN*, NF1*, NF2*, NTHL1*, PALB2*, PDGFRA, PHOX2B, PMS2*, POLD1*, POLE*, POT1*, PRKAR1A*, PTCH1*, PTEN*, RAD50*, RAD51C*, RAD51D*, RB1*, RECQL4, RET, RUNX1*, SDHA*, SDHAF2*, SDHB*, SDHC*, SDHD*, SMAD4*, SMARCA4*, SMARCB1*, SMARCE1*, STK11*, SUFU*, TERC, TERT, TMEM127*, Tp53*, TSC1*, TSC2*, VHL*, WRN*, and WT1.  RNA analysis is performed for * genes.   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 (HCC)   01/30/2022 - 02/20/2022 Chemotherapy   Patient is on Treatment Plan : MELANOMA Nivolumab  + Ipilimumab  (1/3) q21d / Nivolumab  q28d     01/30/2022 -  Chemotherapy   Patient is on Treatment Plan : MELANOMA Nivolumab  (480) q28d     Malignant melanoma metastatic to lung (HCC)  12/19/2021 Initial Diagnosis   Malignant melanoma metastatic to lung (HCC)   01/30/2022 - 02/20/2022 Chemotherapy   Patient is on Treatment Plan : MELANOMA Nivolumab  + Ipilimumab  (1/3) q21d / Nivolumab  q28d     01/30/2022 -  Chemotherapy   Patient is on Treatment Plan : MELANOMA Nivolumab  (480) q28d     03/24/2022 Imaging   CTA chest:  IMPRESSION:  1. Small nonocclusive segmental level pulmonary embolism in the  inferior LEFT upper lobe.  2. Marked bronchial wall thickening with areas of patchy basilar  opacities and new ground-glass opacities in the upper lobes.  Constellation of findings may be infectious  bronchiolitis/bronchitis  though the possibility of drug related changes in the setting of  immunotherapy are also considered. Furthermore, given basilar  predominance and some material in basilar bronchial structures would  correlate with any risk factors for or history of aspiration.  3. Response to therapy of bilateral metastatic lesions. Some lymph  nodes in the chest with interval increase in size are equivocal at  this time, potentially reactive, but warrant attention on subsequent  imaging.    07/22/2022 Imaging   CT chest, abdomen and pelvis:  IMPRESSION:  1. Interval positive response to therapy. Resolved subcarinal and  bilateral hilar lymphadenopathy. Stable to decreased left pulmonary  nodules. No new or progressive metastatic disease in the chest,  abdomen or pelvis.  2. One vessel coronary atherosclerosis.  3. Aortic Atherosclerosis (ICD10-I70.0).    Malignant neoplasm metastatic to liver (HCC)  12/19/2021 Initial Diagnosis  Malignant neoplasm metastatic to liver [facility])   01/30/2022 - 02/20/2022 Chemotherapy   Patient is on Treatment Plan : MELANOMA Nivolumab  + Ipilimumab  (1/3) q21d / Nivolumab  q28d     01/30/2022 -  Chemotherapy   Patient is on Treatment Plan : MELANOMA Nivolumab  (480) q28d     07/22/2022 Imaging   CT chest, abdomen and pelvis:  IMPRESSION:  1. Interval positive response to therapy. Resolved subcarinal and  bilateral hilar lymphadenopathy. Stable to decreased left pulmonary  nodules. No new or progressive metastatic disease in the chest,  abdomen or pelvis.  2. One vessel coronary atherosclerosis.  3. Aortic Atherosclerosis (ICD10-I70.0).        INTERVAL HISTORY:  Xavier Jones is here today for repeat clinical assessment for her metastatic melanoma. Patient states that he feels ok but complains of mild nausea, severe right leg/foot pain accompanied with big toe rating 3/10. He continues Lyrica  75 mg TID. He states that his facial nerves are slowly  starting to come back and he is able to twitch the left side of his lip now. He had a MRI of the brain done on 10/23/2023 which revealed continued stable treated metastases of the left brainstem, right inferior frontal gyrus, and left temporal lobe resection site. Additional punctate enhancement in the central left cerebellum associated with a rounded 8 mm area of FLAIR hyperintensity that has been there since August 2024 is stable, although larger than in 2023 and no new metastatic disease was noted. His day 1 cycle 24 of Nivolumab  is scheduled on 11/20/2023. He has a WBC of 4.9, low hemoglobin of 12.2 improved from 11.9, and platelet count of 256,000. His CMP is normal other than a BUN of 21. His TSH and T4 levels are pending and I will add a uric acid level to his labs today to rule out gout of the toe. I will see him back in 4 weeks with CBC, CMP, TSH, and T4. We will plan repeat scans in July.   He denies fever, chills, night sweats, or other signs of infection. He denies cardiorespiratory and gastrointestinal issues. His appetite is good and his weight has decreased 5 pounds over last month.   REVIEW OF SYSTEMS:  Review of Systems  Constitutional:  Negative for appetite change, chills, diaphoresis, fatigue, fever and unexpected weight change.  HENT:  Negative.  Negative for hearing loss, lump/mass, mouth sores, nosebleeds, sore throat, tinnitus, trouble swallowing and voice change.   Eyes: Negative.   Respiratory: Negative.  Negative for chest tightness, cough, hemoptysis, shortness of breath and wheezing.   Cardiovascular:  Positive for leg swelling (big toe swelling). Negative for chest pain and palpitations.  Gastrointestinal:  Positive for diarrhea. Negative for abdominal distention, abdominal pain, blood in stool, constipation, nausea, rectal pain and vomiting.  Endocrine: Negative.  Negative for hot flashes.  Genitourinary: Negative.  Negative for bladder incontinence, difficulty urinating,  dysuria, frequency, hematuria, nocturia, pelvic pain and penile discharge.   Musculoskeletal: Negative.  Negative for arthralgias, back pain, flank pain, gait problem, myalgias, neck pain and neck stiffness.       Severe pain of the right leg/foot 3/10  Skin: Negative.  Negative for itching, rash and wound.       Skin changes  Neurological:  Positive for numbness (right leg/foot). Negative for dizziness, extremity weakness, gait problem, headaches, light-headedness, seizures and speech difficulty.  Hematological: Negative.  Negative for adenopathy. Does not bruise/bleed easily.  Psychiatric/Behavioral: Negative.  Negative for depression and sleep disturbance. The patient is not  nervous/anxious.      VITALS:  Blood pressure 112/82, pulse 74, temperature 98.3 F (36.8 C), temperature source Oral, resp. rate 18, height 5\' 9"  (1.753 m), weight 169 lb 11.2 oz (77 kg), SpO2 98%.  Wt Readings from Last 3 Encounters:  11/20/23 169 lb 11.2 oz (77 kg)  10/26/23 173 lb (78.5 kg)  10/22/23 174 lb 9.6 oz (79.2 kg)    Body mass index is 25.06 kg/m.  Performance status (ECOG): 1 - Symptomatic but completely ambulatory  PHYSICAL EXAM:  Physical Exam Vitals and nursing note reviewed.  Constitutional:      General: He is not in acute distress.    Appearance: Normal appearance. He is normal weight. He is not ill-appearing, toxic-appearing or diaphoretic.  HENT:     Head: Normocephalic and atraumatic.     Right Ear: Tympanic membrane, ear canal and external ear normal. There is no impacted cerumen.     Left Ear: Tympanic membrane, ear canal and external ear normal. There is no impacted cerumen.     Nose: Nose normal. No congestion or rhinorrhea.     Mouth/Throat:     Mouth: Mucous membranes are moist.     Pharynx: Oropharynx is clear. No oropharyngeal exudate or posterior oropharyngeal erythema.  Eyes:     General: No scleral icterus.    Extraocular Movements: Extraocular movements intact.      Conjunctiva/sclera: Conjunctivae normal.     Pupils: Pupils are equal, round, and reactive to light.  Neck:     Comments: Prominence of the left clavicle   Cardiovascular:     Rate and Rhythm: Normal rate and regular rhythm.     Pulses: Normal pulses.     Heart sounds: Normal heart sounds. No murmur heard.    No friction rub. No gallop.  Pulmonary:     Effort: Pulmonary effort is normal.     Breath sounds: Normal breath sounds. No wheezing, rhonchi or rales.  Abdominal:     General: Bowel sounds are normal. There is no distension.     Palpations: Abdomen is soft. There is no hepatomegaly, splenomegaly or mass.     Tenderness: There is no abdominal tenderness.  Musculoskeletal:        General: Normal range of motion.     Cervical back: Normal range of motion and neck supple. No tenderness.     Right lower leg: No edema.     Left lower leg: No edema.  Feet:     Comments: Edematous with slight pinkness of the right great toe but not warm or tender Lymphadenopathy:     Cervical: No cervical adenopathy.     Upper Body:     Right upper body: No supraclavicular or axillary adenopathy.     Left upper body: No supraclavicular or axillary adenopathy.     Lower Body: No right inguinal adenopathy. No left inguinal adenopathy.  Skin:    General: Skin is warm and dry.     Coloration: Skin is not jaundiced.     Findings: No rash.  Neurological:     General: No focal deficit present.     Mental Status: He is alert and oriented to person, place, and time. Mental status is at baseline.     Cranial Nerves: Facial asymmetry (left facial weakness) present.  Psychiatric:        Mood and Affect: Mood normal.        Behavior: Behavior normal.        Thought Content: Thought  content normal.        Judgment: Judgment normal.     LABS:      Latest Ref Rng & Units 11/20/2023    8:33 AM 10/22/2023    8:30 AM 09/24/2023    9:31 AM  CBC  WBC 4.0 - 10.5 K/uL 4.9  4.6  5.2   Hemoglobin 13.0 - 17.0  g/dL 82.9  56.2  13.0   Hematocrit 39.0 - 52.0 % 36.6  34.7  35.9   Platelets 150 - 400 K/uL 256  251  264       Latest Ref Rng & Units 11/20/2023    8:33 AM 10/22/2023    8:30 AM 09/24/2023    9:31 AM  CMP  Glucose 70 - 99 mg/dL 865  94  90   BUN 6 - 20 mg/dL 21  20  17    Creatinine 0.61 - 1.24 mg/dL 7.84  6.96  2.95   Sodium 135 - 145 mmol/L 141  142  142   Potassium 3.5 - 5.1 mmol/L 4.5  4.1  4.1   Chloride 98 - 111 mmol/L 104  105  106   CO2 22 - 32 mmol/L 26  29  27    Calcium 8.9 - 10.3 mg/dL 9.7  9.3  9.5   Total Protein 6.5 - 8.1 g/dL 6.6  6.3  6.7   Total Bilirubin 0.0 - 1.2 mg/dL 0.5  0.4  0.4   Alkaline Phos 38 - 126 U/L 71  80  81   AST 15 - 41 U/L 19  15  20    ALT 0 - 44 U/L 13  7  13     Lab Results  Component Value Date   TSH 1.891 11/20/2023   T4TOTAL 7.2 11/20/2023   Lab Results  Component Value Date   TOTALPROTELP 6.7 08/27/2023   TOTALPROTELP 6.7 08/27/2023   ALBUMINELP 3.9 08/27/2023   A1GS 0.2 08/27/2023   A2GS 0.7 08/27/2023   BETS 1.0 08/27/2023   GAMS 0.8 08/27/2023   MSPIKE Not Observed 08/27/2023   SPEI Comment 08/27/2023   Lab Results  Component Value Date   TIBC 382 06/02/2023   TIBC 297 05/21/2022   FERRITIN 22 (L) 06/02/2023   FERRITIN 41 05/21/2022   IRONPCTSAT 23 06/02/2023   IRONPCTSAT 18 05/21/2022   Lab Results  Component Value Date   LDH 139 05/21/2022   LDH 145 12/19/2021    STUDIES:  No results found.     HISTORY:   Past Medical History:  Diagnosis Date   Acute pulmonary embolism without acute cor pulmonale (HCC) 05/01/2022   Anemia 05/20/2022   Depression    mild   Family history of breast cancer 01/22/2021   Family history of prostate cancer 01/22/2021   Genital herpes    History of back injury 2003   Pruritic rash 02/18/2022   Skin cancer    melanoma    Past Surgical History:  Procedure Laterality Date   APPLICATION OF CRANIAL NAVIGATION N/A 01/03/2022   Procedure: APPLICATION OF CRANIAL NAVIGATION;   Surgeon: Augusto Blonder, MD;  Location: [facility] OR;  Service: Neurosurgery;  Laterality: N/A;   CRANIOTOMY Left 01/03/2022   Procedure: Stereotactic Left temporal craniotomy for resection of tumor;  Surgeon: Augusto Blonder, MD;  Location: [facility] OR;  Service: Neurosurgery;  Laterality: Left;   MELANOMA EXCISION WITH SENTINEL LYMPH NODE BIOPSY Right 2021   SEPTOPLASTY  05/13/2023   Dr Leveda Rea   SPINAL FUSION W/ LUQUE UNIT ROD  2003  Family History  Problem Relation Age of Onset   Prostate cancer Father        dx 68s   Breast cancer Maternal Aunt        dx after 13; bilateral mastectomy   Stomach cancer Maternal Grandfather        dx 19s   Breast cancer Cousin 66       maternal male cousin; mets    Social History:  reports that he quit smoking about 27 years ago. His smoking use included cigarettes. He has never used smokeless tobacco. He reports current alcohol use of about 14.0 standard drinks of alcohol per week. He reports that he does not currently use drugs.The patient is alone today.  Allergies:  Allergies  Allergen Reactions   Sulfa Antibiotics Rash   Testosterone  Itching    Gel    Current Medications: Current Outpatient Medications  Medication Sig Dispense Refill   acebutolol  (SECTRAL ) 200 MG capsule TAKE ONE CAPSULE BY MOUTH EVERY DAY 90 capsule 1   acyclovir  (ZOVIRAX ) 400 MG tablet Take 400 mg by mouth 3 (three) times daily.     amitriptyline  (ELAVIL ) 25 MG tablet Take 50 mg by mouth at bedtime.     ergocalciferol (VITAMIN D2) 1.25 MG (50000 UT) capsule Take 50,000 Units by mouth every Wednesday.     folic acid  (FOLVITE ) 1 MG tablet Take 1 tablet (1 mg total) by mouth daily. 30 tablet 5   HYDROcodone -acetaminophen  (NORCO/VICODIN) 5-325 MG tablet Take 1 tablet by mouth every 4 (four) hours as needed for moderate pain (pain score 4-6). 150 tablet 0   hydrOXYzine  (ATARAX ) 25 MG tablet TAKE ONE TABLET BY MOUTH 3 TIMES A DAY AS NEEDED FOR ITCHING 60 tablet 3    lidocaine -prilocaine  (EMLA ) cream Apply 1 Application topically as needed. 30 g 0   lisinopril  (ZESTRIL ) 20 MG tablet TAKE ONE TABLET BY MOUTH DAILY 30 tablet 1   pregabalin  (LYRICA ) 75 MG capsule Take 1 capsule (75 mg total) by mouth 3 (three) times daily. 90 capsule 5   XARELTO  20 MG TABS tablet TAKE ONE TABLET BY MOUTH EVERY DAY WITH SUPPER. START AFTER 3 WEEKS OF 15MG 2 TIMES A DAY 30 tablet 3   No current facility-administered medications for this visit.    I,Jasmine M Lassiter,acting as a scribe for Nolia Baumgartner, MD.,have documented all relevant documentation on the behalf of Nolia Baumgartner, MD,as directed by  Nolia Baumgartner, MD while in the presence of Nolia Baumgartner, MD.

## 2023-11-20 ENCOUNTER — Other Ambulatory Visit: Payer: Self-pay

## 2023-11-20 ENCOUNTER — Encounter: Payer: Self-pay | Admitting: Oncology

## 2023-11-20 ENCOUNTER — Inpatient Hospital Stay (HOSPITAL_BASED_OUTPATIENT_CLINIC_OR_DEPARTMENT_OTHER): Admitting: Oncology

## 2023-11-20 ENCOUNTER — Other Ambulatory Visit: Payer: Self-pay | Admitting: Oncology

## 2023-11-20 ENCOUNTER — Inpatient Hospital Stay: Attending: Hematology and Oncology

## 2023-11-20 ENCOUNTER — Inpatient Hospital Stay

## 2023-11-20 VITALS — BP 112/82 | HR 74 | Temp 98.3°F | Resp 18 | Ht 69.0 in | Wt 169.7 lb

## 2023-11-20 DIAGNOSIS — C439 Malignant melanoma of skin, unspecified: Secondary | ICD-10-CM

## 2023-11-20 DIAGNOSIS — Z79620 Long term (current) use of immunosuppressive biologic: Secondary | ICD-10-CM | POA: Diagnosis not present

## 2023-11-20 DIAGNOSIS — Z5112 Encounter for antineoplastic immunotherapy: Secondary | ICD-10-CM | POA: Insufficient documentation

## 2023-11-20 DIAGNOSIS — C7931 Secondary malignant neoplasm of brain: Secondary | ICD-10-CM

## 2023-11-20 DIAGNOSIS — Z87891 Personal history of nicotine dependence: Secondary | ICD-10-CM | POA: Insufficient documentation

## 2023-11-20 DIAGNOSIS — C778 Secondary and unspecified malignant neoplasm of lymph nodes of multiple regions: Secondary | ICD-10-CM | POA: Diagnosis not present

## 2023-11-20 DIAGNOSIS — C4361 Malignant melanoma of right upper limb, including shoulder: Secondary | ICD-10-CM | POA: Diagnosis not present

## 2023-11-20 DIAGNOSIS — C7800 Secondary malignant neoplasm of unspecified lung: Secondary | ICD-10-CM

## 2023-11-20 DIAGNOSIS — C787 Secondary malignant neoplasm of liver and intrahepatic bile duct: Secondary | ICD-10-CM

## 2023-11-20 LAB — CMP (CANCER CENTER ONLY)
ALT: 13 U/L (ref 0–44)
AST: 19 U/L (ref 15–41)
Albumin: 4.1 g/dL (ref 3.5–5.0)
Alkaline Phosphatase: 71 U/L (ref 38–126)
Anion gap: 11 (ref 5–15)
BUN: 21 mg/dL — ABNORMAL HIGH (ref 6–20)
CO2: 26 mmol/L (ref 22–32)
Calcium: 9.7 mg/dL (ref 8.9–10.3)
Chloride: 104 mmol/L (ref 98–111)
Creatinine: 0.82 mg/dL (ref 0.61–1.24)
GFR, Estimated: >60 mL/min (ref >60)
Glucose, Bld: 120 mg/dL — ABNORMAL HIGH (ref 70–99)
Potassium: 4.5 mmol/L (ref 3.5–5.1)
Sodium: 141 mmol/L (ref 135–145)
Total Bilirubin: 0.5 mg/dL (ref 0.0–1.2)
Total Protein: 6.6 g/dL (ref 6.5–8.1)

## 2023-11-20 LAB — CBC WITH DIFFERENTIAL (CANCER CENTER ONLY)
Abs Immature Granulocytes: 0.01 10*3/uL (ref 0.00–0.07)
Basophils Absolute: 0.1 10*3/uL (ref 0.0–0.1)
Basophils Relative: 1 %
Eosinophils Absolute: 0.1 10*3/uL (ref 0.0–0.5)
Eosinophils Relative: 3 %
HCT: 36.6 % — ABNORMAL LOW (ref 39.0–52.0)
Hemoglobin: 12.2 g/dL — ABNORMAL LOW (ref 13.0–17.0)
Immature Granulocytes: 0 %
Lymphocytes Relative: 28 %
Lymphs Abs: 1.4 10*3/uL (ref 0.7–4.0)
MCH: 31.5 pg (ref 26.0–34.0)
MCHC: 33.3 g/dL (ref 30.0–36.0)
MCV: 94.6 fL (ref 80.0–100.0)
Monocytes Absolute: 0.5 10*3/uL (ref 0.1–1.0)
Monocytes Relative: 10 %
Neutro Abs: 2.9 10*3/uL (ref 1.7–7.7)
Neutrophils Relative %: 58 %
Platelet Count: 256 10*3/uL (ref 150–400)
RBC: 3.87 MIL/uL — ABNORMAL LOW (ref 4.22–5.81)
RDW: 12.3 % (ref 11.5–15.5)
WBC Count: 4.9 10*3/uL (ref 4.0–10.5)
nRBC: 0 % (ref 0.0–0.2)

## 2023-11-20 LAB — URIC ACID: Uric Acid, Serum: 5.4 mg/dL (ref 3.7–8.6)

## 2023-11-20 LAB — TSH: TSH: 1.891 u[IU]/mL (ref 0.350–4.500)

## 2023-11-20 MED ORDER — SODIUM CHLORIDE 0.9 % IV SOLN
480.0000 mg | Freq: Once | INTRAVENOUS | Status: AC
  Administered 2023-11-20: 480 mg via INTRAVENOUS
  Filled 2023-11-20: qty 48

## 2023-11-20 MED ORDER — SODIUM CHLORIDE 0.9% FLUSH
10.0000 mL | INTRAVENOUS | Status: DC | PRN
  Administered 2023-11-20: 10 mL

## 2023-11-20 MED ORDER — HEPARIN SOD (PORK) LOCK FLUSH 100 UNIT/ML IV SOLN
500.0000 [IU] | Freq: Once | INTRAVENOUS | Status: AC | PRN
Start: 2023-11-20 — End: 2023-11-20
  Administered 2023-11-20: 500 [IU]

## 2023-11-20 MED ORDER — SODIUM CHLORIDE 0.9 % IV SOLN: Freq: Once | INTRAVENOUS | Status: AC

## 2023-11-20 NOTE — Patient Instructions (Signed)
 Nivolumab Injection What is this medication? NIVOLUMAB (nye VOL ue mab) treats some types of cancer. It works by helping your immune system slow or stop the spread of cancer cells. It is a monoclonal antibody. This medicine may be used for other purposes; ask your health care provider or pharmacist if you have questions. COMMON BRAND NAME(S): Opdivo What should I tell my care team before I take this medication? They need to know if you have any of these conditions: Allogeneic stem cell transplant (uses someone else's stem cells) Autoimmune diseases, such as Crohn disease, ulcerative colitis, lupus History of chest radiation Nervous system problems, such as Guillain-Barre syndrome or myasthenia gravis Organ transplant An unusual or allergic reaction to nivolumab, other medications, foods, dyes, or preservatives Pregnant or trying to get pregnant Breast-feeding How should I use this medication? This medication is infused into a vein. It is given in a hospital or clinic setting. A special MedGuide will be given to you before each treatment. Be sure to read this information carefully each time. Talk to your care team about the use of this medication in children. While it may be prescribed for children as young as 12 years for selected conditions, precautions do apply. Overdosage: If you think you have taken too much of this medicine contact a poison control center or emergency room at once. NOTE: This medicine is only for you. Do not share this medicine with others. What if I miss a dose? Keep appointments for follow-up doses. It is important not to miss your dose. Call your care team if you are unable to keep an appointment. What may interact with this medication? Interactions have not been studied. This list may not describe all possible interactions. Give your health care provider a list of all the medicines, herbs, non-prescription drugs, or dietary supplements you use. Also tell them if you  smoke, drink alcohol, or use illegal drugs. Some items may interact with your medicine. What should I watch for while using this medication? Your condition will be monitored carefully while you are receiving this medication. You may need blood work while taking this medication. This medication may cause serious skin reactions. They can happen weeks to months after starting the medication. Contact your care team right away if you notice fevers or flu-like symptoms with a rash. The rash may be red or purple and then turn into blisters or peeling of the skin. You may also notice a red rash with swelling of the face, lips, or lymph nodes in your neck or under your arms. Tell your care team right away if you have any change in your eyesight. Talk to your care team if you are pregnant or think you might be pregnant. A negative pregnancy test is required before starting this medication. A reliable form of contraception is recommended while taking this medication and for 5 months after the last dose. Talk to your care team about effective forms of contraception. Do not breast-feed while taking this medication and for 5 months after the last dose. What side effects may I notice from receiving this medication? Side effects that you should report to your care team as soon as possible: Allergic reactions--skin rash, itching, hives, swelling of the face, lips, tongue, or throat Dry cough, shortness of breath or trouble breathing Eye pain, redness, irritation, or discharge with blurry or decreased vision Heart muscle inflammation--unusual weakness or fatigue, shortness of breath, chest pain, fast or irregular heartbeat, dizziness, swelling of the ankles, feet, or hands Hormone  gland problems--headache, sensitivity to light, unusual weakness or fatigue, dizziness, fast or irregular heartbeat, increased sensitivity to cold or heat, excessive sweating, constipation, hair loss, increased thirst or amount of urine,  tremors or shaking, irritability Infusion reactions--chest pain, shortness of breath or trouble breathing, feeling faint or lightheaded Kidney injury (glomerulonephritis)--decrease in the amount of urine, red or dark brown urine, foamy or bubbly urine, swelling of the ankles, hands, or feet Liver injury--right upper belly pain, loss of appetite, nausea, light-colored stool, dark yellow or brown urine, yellowing skin or eyes, unusual weakness or fatigue Pain, tingling, or numbness in the hands or feet, muscle weakness, change in vision, confusion or trouble speaking, loss of balance or coordination, trouble walking, seizures Rash, fever, and swollen lymph nodes Redness, blistering, peeling, or loosening of the skin, including inside the mouth Sudden or severe stomach pain, bloody diarrhea, fever, nausea, vomiting Side effects that usually do not require medical attention (report these to your care team if they continue or are bothersome): Bone, joint, or muscle pain Diarrhea Fatigue Loss of appetite Nausea Skin rash This list may not describe all possible side effects. Call your doctor for medical advice about side effects. You may report side effects to FDA at 1-800-FDA-1088. Where should I keep my medication? This medication is given in a hospital or clinic. It will not be stored at home. NOTE: This sheet is a summary. It may not cover all possible information. If you have questions about this medicine, talk to your doctor, pharmacist, or health care provider.  2024 Elsevier/Gold Standard (2021-10-21 00:00:00)

## 2023-11-21 LAB — T4: T4, Total: 7.2 ug/dL (ref 4.5–12.0)

## 2023-11-29 ENCOUNTER — Encounter: Payer: Self-pay | Admitting: Oncology

## 2023-12-01 ENCOUNTER — Telehealth: Payer: Self-pay

## 2023-12-01 NOTE — Telephone Encounter (Signed)
 Attempted to contact patient. No answer and no VM.

## 2023-12-01 NOTE — Telephone Encounter (Signed)
-----   Message from Nolia Baumgartner sent at 11/29/2023  4:31 PM EDT ----- Regarding: call Tell him test for gout was negative and thyroid  remains normal

## 2023-12-07 ENCOUNTER — Encounter: Payer: Self-pay | Admitting: Oncology

## 2023-12-07 ENCOUNTER — Telehealth: Payer: Self-pay

## 2023-12-07 NOTE — Telephone Encounter (Signed)
-----   Message from Nolia Baumgartner sent at 11/29/2023  4:31 PM EDT ----- Regarding: call Tell him test for gout was negative and thyroid  remains normal

## 2023-12-07 NOTE — Telephone Encounter (Signed)
 Attempted to contact patient. No answer.

## 2023-12-14 NOTE — Assessment & Plan Note (Addendum)
 This was suspicious on the PET scan in the left frontoparietal lobe.  MRI of the brain confirmed a 2.6 cm heterogeneous lesion of the left superior temporal gyrus with mild enhancement and extensive surrounding edema.  He also has an 8 mm cortical lesion of the inferomedial right frontal lobe with mild edema, a 2 mm mildly enhancing cortical lesion of the inferolateral left frontal lobe and a intrinsically T1 hyperintense lesion of the left lateral pons measuring 7 mm with surrounding edema.  He has 4 lesions in total, but 3 are less than 1 cm.  He was treated with surgical resection of the larger lesion and stereotactic radiation.    He had recurrent neurologic symptoms with left-sided facial droop and left-sided weakness in September 2023 and was placed on dexamethasone  4 mg 3 times daily.  Brain revealed thin linear enhancement along the resection margin of the left temporal resection cavity, which may be postoperative, with additional small areas of somewhat more nodular enhancement, which are nonspecific, but could indicate recurrent disease. Attention on follow-up. Interval increase in the size of the right inferior frontal gyrus enhancing lesion with increased surrounding edema, mild local mass effect, and mild local midline shift. Stable to slightly decreased size of the left pontine lesion with decreased associated edema. Previously noted left inferior frontal gyrus lesion is no longer visualized. No definite new lesion.  Dr. Mark Sil slowly weaned him off dexamethasone .  MRI brain in December 2023 was stable.  He has had persistent left facial weakness.   MRI brain in March revealed increased size of a hemorrhagic left pons/middle cerebellar peduncle lesion with increased surrounding edema, similar versus slightly decreased size of a 13 x 10 mm enhancing lesion in the medial right frontal lobe, and mildly diminished intrinsic T1 hyperintensity of the left temporal resection site with linear enhancement  along the lateral margin, probably postsurgical/post radiation.  PET brain in April did not show hyperactivity, but more radiation necrosis, so these changes are more consistent with posttreatment changes.  MRI head in August revealed new punctate superior right frontal lobe enhancement compatible with interval metastasis, interval regression of enhancement and edema in the left brachium pontis and pons. There was also a vasogenic edema at a pre-existing right frontal metastasis with underlying lesion enhancement remaining stable.  Dr. Mark Sil recommended continued observation and saw him in December 2024 with a repeat MRI brain.  This looks quite good and the punctate lesion seen on the last scan has resolved.  We see no new lesions and overall the scan looks good, so Dr. Mark Sil will repeat it 4 months this time.  This was suspicious on the PET scan in the left frontoparietal lobe.  MRI of the brain confirmed a 2.6 cm heterogeneous lesion of the left superior temporal gyrus with mild enhancement and extensive surrounding edema.  He also has an 8 mm cortical lesion of the inferomedial right frontal lobe with mild edema, a 2 mm mildly enhancing cortical lesion of the inferolateral left frontal lobe and a intrinsically T1 hyperintense lesion of the left lateral pons measuring 7 mm with surrounding edema.  He has 4 lesions in total, but 3 are less than 1 cm.  He was treated with surgical resection of the larger lesion and stereotactic radiation.    He had recurrent neurologic symptoms with left-sided facial droop and left-sided weakness in September 2023 and was placed on dexamethasone  4 mg 3 times daily.  Brain revealed thin linear enhancement along the resection margin of the  left temporal resection cavity, which may be postoperative, with additional small areas of somewhat more nodular enhancement, which are nonspecific, but could indicate recurrent disease. Attention on follow-up. Interval increase in the size of  the right inferior frontal gyrus enhancing lesion with increased surrounding edema, mild local mass effect, and mild local midline shift. Stable to slightly decreased size of the left pontine lesion with decreased associated edema. Previously noted left inferior frontal gyrus lesion is no longer visualized. No definite new lesion.  Dr. Mark Sil slowly weaned him off dexamethasone .  MRI brain in December 2023 was stable.  He has had persistent left facial weakness.   MRI brain in March revealed increased size of a hemorrhagic left pons/middle cerebellar peduncle lesion with increased surrounding edema, similar versus slightly decreased size of a 13 x 10 mm enhancing lesion in the medial right frontal lobe, and mildly diminished intrinsic T1 hyperintensity of the left temporal resection site with linear enhancement along the lateral margin, probably postsurgical/post radiation.  PET brain in April did not show hyperactivity, but more radiation necrosis, so these changes are more consistent with posttreatment changes.  MRI head in August revealed new punctate superior right frontal lobe enhancement compatible with interval metastasis, interval regression of enhancement and edema in the left brachium pontis and pons. There was also a vasogenic edema at a pre-existing right frontal metastasis with underlying lesion enhancement remaining stable.  Dr. Mark Sil recommended continued observation.  He has had chronic lightheadedness, hearing loss, and tinnitus that has been going on since radiation to the head, which worsens with loud noise, and he works in a loud environment. He has seen a ENT about this to no avail. This is felt to be due to radiation.  Dr. Mark Sil saw him in December 2024 with MRI brain, which looked quite good and the punctate lesion seen on the last scan had resolved.  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 Additional punctate enhancement in the central left cerebellum, intermittently visible over this series of exams, and associated with a rounded 8 mm area of FLAIR hyperintensity were stable since August 2024, although larger than in 2023. This is most likely an additional tiny treated metastasis. No new metastatic disease identified.  He is scheduled to see Dr. Mark Sil again in October with repeat MRI brain.

## 2023-12-14 NOTE — Assessment & Plan Note (Addendum)
 He had bilateral hypermetabolic pulmonary nodules which increased in size by the time treatment was started. The largest nodule is up to 2.3 cm in diameter. CT in January 2024 revealed a good response. Most recent CT chest in July showed 2 tiny 4 mm nodules, which were persistent and unchanged.  No new disease was seen.  He has continued maintenance nivolumab .  He is doing well at this time, so we will proceed with a 24th cycle of nivolumab  today.  We will plan to see him back in 4 weeks with a CBC, comprehensive metabolic panel, TSH and CT chest, abdomen and pelvis prior to his next cycle of nivolumab .

## 2023-12-14 NOTE — Progress Notes (Signed)
 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  MRI of the brain confirmed a 2.6 cm heterogeneous lesion of the left superior temporal gyrus with mild enhancement and extensive surrounding edema.  He also has an 8 mm cortical lesion of the inferomedial right frontal lobe with mild edema, a 2 mm mildly enhancing cortical lesion of the inferolateral left frontal lobe and a intrinsically T1 hyperintense lesion of the left lateral pons measuring 7 mm with surrounding edema.  He has 4 lesions in total, but 3 are less than 1 cm.  He was treated with surgical resection of the larger lesion and stereotactic radiation.    He had recurrent neurologic symptoms with left-sided facial droop and left-sided weakness in September 2023 and was placed on dexamethasone  4 mg 3 times daily.  Brain revealed thin linear enhancement along the resection margin of the left temporal resection cavity, which may be postoperative, with additional small areas of somewhat more nodular enhancement, which are nonspecific, but could indicate recurrent disease. Attention on follow-up. Interval increase in the size of the right inferior frontal gyrus enhancing lesion with increased surrounding edema, mild local mass effect, and mild local midline shift. Stable to slightly decreased size of the left pontine lesion with decreased associated edema. Previously noted left inferior frontal gyrus lesion is no longer visualized. No definite new lesion.  Dr. Mark Sil slowly weaned him off dexamethasone .  MRI brain in December 2023 was stable.  He has had persistent left facial weakness.   MRI brain in March revealed increased size of a hemorrhagic left pons/middle cerebellar peduncle lesion  with increased surrounding edema, similar versus slightly decreased size of a 13 x 10 mm enhancing lesion in the medial right frontal lobe, and mildly diminished intrinsic T1 hyperintensity of the left temporal resection site with linear enhancement along the lateral margin, probably postsurgical/post radiation.  PET brain in April did not show hyperactivity, but more radiation necrosis, so these changes are more consistent with posttreatment changes.  MRI head in August revealed new punctate superior right frontal lobe enhancement compatible with interval metastasis, interval regression of enhancement and edema in the left brachium pontis and pons. There was also a vasogenic edema at a pre-existing right frontal metastasis with underlying lesion enhancement remaining stable.  Dr. Mark Sil recommended continued observation and saw him in December 2024 with a repeat MRI brain.  This looks quite good and the punctate lesion seen on the last scan has resolved.  We see no new lesions and overall the scan looks good, so Dr. Mark Sil will repeat it 4 months this time.  This was suspicious on the PET scan in the left frontoparietal lobe.  MRI of the brain confirmed a 2.6 cm heterogeneous lesion of the left superior temporal gyrus with mild enhancement and extensive surrounding edema.  He also has an 8 mm cortical lesion of the inferomedial right frontal lobe with mild edema, a 2 mm mildly enhancing cortical lesion of the inferolateral left frontal lobe and a intrinsically T1 hyperintense lesion of the left lateral pons measuring 7 mm with surrounding edema.  He has 4 lesions in total, but 3 are less than 1 cm.  He was treated with surgical resection of the larger lesion  and stereotactic radiation.    He had recurrent neurologic symptoms with left-sided facial droop and left-sided weakness in September 2023 and was placed on dexamethasone  4 mg 3 times daily.  Brain revealed thin linear enhancement along the resection margin  of the left temporal resection cavity, which may be postoperative, with additional small areas of somewhat more nodular enhancement, which are nonspecific, but could indicate recurrent disease. Attention on follow-up. Interval increase in the size of the right inferior frontal gyrus enhancing lesion with increased surrounding edema, mild local mass effect, and mild local midline shift. Stable to slightly decreased size of the left pontine lesion with decreased associated edema. Previously noted left inferior frontal gyrus lesion is no longer visualized. No definite new lesion.  Dr. Mark Sil slowly weaned him off dexamethasone .  MRI brain in December 2023 was stable.  He has had persistent left facial weakness.   MRI brain in March revealed increased size of a hemorrhagic left pons/middle cerebellar peduncle lesion with increased surrounding edema, similar versus slightly decreased size of a 13 x 10 mm enhancing lesion in the medial right frontal lobe, and mildly diminished intrinsic T1 hyperintensity of the left temporal resection site with linear enhancement along the lateral margin, probably postsurgical/post radiation.  PET brain in April did not show hyperactivity, but more radiation necrosis, so these changes are more consistent with posttreatment changes.  MRI head in August revealed new punctate superior right frontal lobe enhancement compatible with interval metastasis, interval regression of enhancement and edema in the left brachium pontis and pons. There was also a vasogenic edema at a pre-existing right frontal metastasis with underlying lesion enhancement remaining stable.  Dr. Mark Sil recommended continued observation.  He has had chronic lightheadedness, hearing loss, and tinnitus that has been going on since radiation to the head, which worsens with loud noise, and he works in a loud environment. He has seen a ENT about this to no avail. This is felt to be due to radiation.  Dr. Mark Sil saw him in  December 2024 with MRI brain, which looked quite good and the punctate lesion seen on the last scan had resolved.  MRI brain in April revealed continued stable treated metastases of the left brainstem, right inferior frontal gyrus, left temporal lobe resection site. Additional punctate enhancement in the central left cerebellum, intermittently visible over this series of exams, and associated with a rounded 8 mm area of FLAIR hyperintensity were stable since August 2024, although larger than in 2023. This is most likely an additional tiny treated metastasis. No new metastatic disease identified.  He is scheduled to see Dr. Mark Sil again in October with repeat MRI brain.  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 The largest nodule is up to 2.3 cm in diameter. CT in January 2024 revealed a good response. Most recent CT chest in July showed 2 tiny 4 mm nodules, which were persistent and unchanged.  No new disease was seen.  He has continued maintenance nivolumab .  He is doing well at this time, so we will proceed with a 24th cycle of nivolumab  today.  We will plan to see him back in 4 weeks with a CBC, comprehensive metabolic panel, TSH and CT chest, abdomen and pelvis prior to his next cycle of nivolumab .  Malignant melanoma metastatic to skin [facility]) He presented with a new area of concern of the left mid back in June 2023.  A superficial  nodule was noted approximately 1 cm to the right of his upper thoracic spine,and it had increased in size. Biopsy which revealed metastatic malignant melanoma.   He had several hypermetabolic subcutaneous nodules seen on PET, which have resolved with treatment. There are no new skin lesions.  Melanoma of skin (HCC) Stage IIB malignant melanoma of the right posterior shoulder, Breslow's depth 3.3 mm, Clark level IV, treated with wide local excision, diagnosed in May  2021.  There was still a question as to whether this represented a metastatic lesion, rather than a primary, based on the presence of 2 nodules and no epidermal involvement.  MRI head and CT chest, abdomen and pelvis in June 2021 were negative for metastatic disease.   No adjuvant therapy was recommended.  PET scan in September was negative for metastatic disease.    In June 2023, he was found to have widespread metastatic disease, including to the brain.  CT scans in January 2024 revealed resolved subcarinal and bilateral hilar lymphadenopathy. Stable to decreased left pulmonary nodules. No new or progressive metastatic disease in the chest, abdomen or pelvis. Most recent CT scans in July 2024 stable. He will have CT chest, abdomen and pelvis in 3 weeks prior to his next cycle of nivolumab .  Acute pulmonary embolism without acute cor pulmonale (HCC) Non occlusive segmental pulmonary embolism in the inferior left upper lobe diagnosed in September 2023.  Due to his malignancy, he remains on rivaroxaban  20 mg daily.  He denies abnormal bruising or bleeding.  Right foot pain Persistent right foot pain which has been attributed to neuropathy, but increased pain with walking.  I will obtain plain films today for further evaluation.    The patient understands the plans discussed today and is in agreement with them.  He knows to contact our office if he develops concerns prior to his next appointment.   I provided 30 minutes of face-to-face time during this encounter and > 50% was spent counseling as documented under my assessment and plan.    Angeliah Wisdom A Yacine Droz, PA-C  [facility] [place] - A DEPT OF [facility] [address] [place] [address] Dept: [phone] Dept Fax: [phone]   Orders Placed This Encounter  Procedures   DG Foot 2 Views Right    Standing Status:   Future    Expected Date:   12/18/2023    Expiration Date:   12/17/2024     Reason for Exam (SYMPTOM  OR DIAGNOSIS REQUIRED):   right foot pain    Preferred imaging location?:   [facility]   CT CHEST ABDOMEN PELVIS W CONTRAST    Standing Status:   Future    Expected Date:   01/06/2024    Expiration Date:   12/17/2024    If indicated for the ordered procedure, I authorize the administration of contrast media per Radiology protocol:   Yes    Does the patient have a contrast media/X-ray dye allergy?:   No    Preferred imaging location?:   [facility]    If indicated for the ordered procedure, I authorize the administration of oral contrast media per Radiology protocol:   Yes      CHIEF COMPLAINT:  CC: Metastatic melanoma  Current Treatment: Maintenance nivolumab  every 4 weeks  HISTORY OF PRESENT ILLNESS:   Oncology History  Melanoma of skin (HCC)  12/01/2019 Initial Diagnosis   Melanoma of skin (HCC)   12/05/2019 Cancer Staging   Staging  form: Melanoma of the Skin, AJCC 8th Edition - Clinical stage from 12/05/2019: Stage IIA (cT3a, cN0, cM0) - Signed by Nolia Baumgartner, MD on 05/30/2020   02/18/2021 Genetic Testing   No pathogenic variants detected in [facility] Multi-Cancer +RNA Panel.  Variants of uncertain significance detected in APC (c.681C>G (p.Asp227Glu)), CDKN1C (c.610C>G (p.Pro204Ala)), and FH (c.1151C>T (p.Ala384Val)).  The report date is February 18, 2021.    The Multi-Cancer + RNA Panel offered by [facility] includes sequencing and/or deletion/duplication analysis of the following 84 genes:  AIP*, ALK, APC*, ATM*, AXIN2*, BAP1*, BARD1*, BLM*, BMPR1A*, BRCA1*, BRCA2*, BRIP1*, CASR, CDC73*, CDH1*, CDK4, CDKN1B*, CDKN1C*, CDKN2A, CEBPA, CHEK2*, CTNNA1*, DICER1*, DIS3L2*, EGFR, EPCAM, FH*, FLCN*, GATA2*, GPC3, GREM1, HOXB13, HRAS, KIT, MAX*, MEN1*, MET, MITF, MLH1*, MSH2*, MSH3*, MSH6*, MUTYH*, NBN*, NF1*, NF2*, NTHL1*, PALB2*, PDGFRA, PHOX2B, PMS2*, POLD1*, POLE*, POT1*, PRKAR1A*, PTCH1*, PTEN*, RAD50*, RAD51C*, RAD51D*, RB1*, RECQL4, RET, RUNX1*,  SDHA*, SDHAF2*, SDHB*, SDHC*, SDHD*, SMAD4*, SMARCA4*, SMARCB1*, SMARCE1*, STK11*, SUFU*, TERC, TERT, TMEM127*, Tp53*, TSC1*, TSC2*, VHL*, WRN*, and WT1.  RNA analysis is performed for * genes.   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 (HCC)   01/30/2022 - 02/20/2022 Chemotherapy   Patient is on Treatment Plan : MELANOMA Nivolumab  + Ipilimumab  (1/3) q21d / Nivolumab  q28d     01/30/2022 -  Chemotherapy   Patient is on Treatment Plan : MELANOMA Nivolumab  (480) q28d     Malignant melanoma metastatic to lung (HCC)  12/19/2021 Initial Diagnosis   Malignant melanoma metastatic to lung (HCC)   01/30/2022 - 02/20/2022 Chemotherapy   Patient is on Treatment Plan : MELANOMA Nivolumab  + Ipilimumab  (1/3) q21d / Nivolumab  q28d     01/30/2022 -  Chemotherapy   Patient is on Treatment Plan : MELANOMA Nivolumab  (480) q28d     03/24/2022 Imaging   CTA chest:  IMPRESSION:  1. Small nonocclusive segmental level pulmonary embolism in the  inferior LEFT upper lobe.  2. Marked bronchial wall thickening with areas of patchy basilar  opacities and new ground-glass opacities in the upper lobes.  Constellation of findings may be infectious bronchiolitis/bronchitis  though the possibility of drug related changes in the setting of  immunotherapy are also considered. Furthermore, given basilar  predominance and some material in basilar bronchial structures would  correlate with any risk factors for or history of aspiration.  3. Response to therapy of bilateral metastatic lesions. Some lymph  nodes in the chest with interval increase in size are equivocal at  this time, potentially reactive, but warrant attention on  subsequent  imaging.    07/22/2022 Imaging   CT chest, abdomen and pelvis:  IMPRESSION:  1. Interval positive response to therapy. Resolved subcarinal and  bilateral hilar lymphadenopathy. Stable to decreased left pulmonary  nodules. No new or progressive metastatic disease in the chest,  abdomen or pelvis.  2. One vessel coronary atherosclerosis.  3. Aortic Atherosclerosis (ICD10-I70.0).    Malignant neoplasm metastatic to liver (HCC)  12/19/2021 Initial Diagnosis   Malignant neoplasm metastatic to liver (HCC)   01/30/2022 - 02/20/2022 Chemotherapy   Patient is on Treatment Plan : MELANOMA Nivolumab  + Ipilimumab  (1/3) q21d / Nivolumab  q28d     01/30/2022 -  Chemotherapy   Patient is on Treatment Plan : MELANOMA Nivolumab  (480) q28d     07/22/2022 Imaging   CT chest, abdomen and pelvis:  IMPRESSION:  1. Interval positive response to therapy. Resolved subcarinal and  bilateral hilar lymphadenopathy. Stable to decreased left pulmonary  nodules. No new or progressive metastatic disease in the chest,  abdomen or pelvis.  2. One vessel coronary atherosclerosis.  3. Aortic Atherosclerosis (ICD10-I70.0).        INTERVAL HISTORY:   Xavier Jones is here today for repeat clinical assessment prior to his next cycle of maintenance nivolumab .  He states he continues to tolerate treatment well.  He has mild generalized itching.  He reports occasional diarrhea.  He reports persistent paresthesias of the right leg.  He has increased foot pain with walking.  He has chronic neurologic symptoms are stable.  He denies fevers or chills. He denies pain. His appetite is good. His weight has been stable.  REVIEW OF SYSTEMS:   Review of Systems  Constitutional:  Positive for fatigue (Mild). Negative for appetite change, chills, fever and unexpected weight change.  HENT:   Positive for tinnitus. Negative for lump/mass, mouth sores and sore throat.   Eyes:  Positive for eye problems (Left eye dryness,  intermittent blurry vision).  Respiratory:  Negative for cough and shortness of breath.   Cardiovascular:  Negative for chest pain and leg swelling.  Gastrointestinal:  Positive for diarrhea. Negative for abdominal pain, constipation, nausea and vomiting.  Genitourinary:  Negative for difficulty urinating, dysuria, frequency and hematuria.   Musculoskeletal:  Negative for arthralgias, back pain, gait problem and myalgias.  Skin:  Positive for itching (Mild generalized itching) and wound (Cut on hand). Negative for rash.  Neurological:  Positive for dizziness (Intermittent), headaches (Intermittent) and numbness (Numbness and tingling right leg). Negative for extremity weakness, gait problem, light-headedness, seizures and speech difficulty.  Hematological:  Negative for adenopathy. Does not bruise/bleed easily.  Psychiatric/Behavioral:  Negative for depression and sleep disturbance. The patient is not nervous/anxious.      VITALS:   Blood pressure 117/82, pulse 92, temperature 97.8 F (36.6 C), temperature source Oral, resp. rate 18, height 5' 9 (1.753 m), weight 172 lb 8 oz (78.2 kg), SpO2 100%.  Wt Readings from Last 3 Encounters:  12/18/23 172 lb 8 oz (78.2 kg)  11/20/23 169 lb 11.2 oz (77 kg)  10/26/23 173 lb (78.5 kg)    Body mass index is 25.47 kg/m.  Performance status (ECOG): 1 - Symptomatic but completely ambulatory    PHYSICAL EXAM:   Physical Exam Vitals and nursing note reviewed.  Constitutional:      General: He is not in acute distress.    Appearance: He is normal weight. He is not ill-appearing.  HENT:     Head: Normocephalic and atraumatic.     Mouth/Throat:     Mouth: Mucous membranes are moist.     Pharynx: Oropharynx is clear. No oropharyngeal exudate or posterior oropharyngeal erythema.   Eyes:     General: No scleral icterus.    Extraocular Movements: Extraocular movements intact.     Conjunctiva/sclera: Conjunctivae normal.     Pupils: Pupils are  equal, round, and reactive to light.    Cardiovascular:     Rate and Rhythm: Normal rate and regular rhythm.     Heart sounds: Normal heart sounds. No murmur heard.    No friction rub. No gallop.  Pulmonary:     Effort: Pulmonary effort is normal.  Breath sounds: Normal breath sounds. No wheezing, rhonchi or rales.  Abdominal:     General: Bowel sounds are normal. There is no distension.     Palpations: Abdomen is soft. There is no hepatomegaly, splenomegaly or mass.     Tenderness: There is no abdominal tenderness.   Musculoskeletal:        General: Normal range of motion.     Cervical back: Normal range of motion and neck supple. No tenderness.     Right lower leg: No edema.     Left lower leg: No edema.  Lymphadenopathy:     Cervical: No cervical adenopathy.     Upper Body:     Right upper body: No supraclavicular or axillary adenopathy.     Left upper body: No supraclavicular or axillary adenopathy.     Lower Body: No right inguinal adenopathy. No left inguinal adenopathy.   Skin:    General: Skin is warm and dry.     Coloration: Skin is not jaundiced.     Findings: No lesion or rash.   Neurological:     Mental Status: He is alert and oriented to person, place, and time.     Cranial Nerves: Cranial nerve deficit (Left facial weakness) and facial asymmetry (Drooping left eye and mouth) present.     Sensory: Sensory deficit (Left facial numbness) present.   Psychiatric:        Mood and Affect: Mood normal.        Behavior: Behavior normal.        Thought Content: Thought content normal.     LABS:      Latest Ref Rng & Units 12/18/2023    9:09 AM 11/20/2023    8:33 AM 10/22/2023    8:30 AM  CBC  WBC 4.0 - 10.5 K/uL 4.2  4.9  4.6   Hemoglobin 13.0 - 17.0 g/dL 32.4  40.1  02.7   Hematocrit 39.0 - 52.0 % 37.8  36.6  34.7   Platelets 150 - 400 K/uL 247  256  251       Latest Ref Rng & Units 12/18/2023    9:09 AM 11/20/2023    8:33 AM 10/22/2023    8:30 AM  CMP   Glucose 70 - 99 mg/dL 253  664  94   BUN 6 - 20 mg/dL 21  21  20    Creatinine 0.61 - 1.24 mg/dL 4.03  4.74  2.59   Sodium 135 - 145 mmol/L 141  141  142   Potassium 3.5 - 5.1 mmol/L 4.2  4.5  4.1   Chloride 98 - 111 mmol/L 105  104  105   CO2 22 - 32 mmol/L 26  26  29    Calcium 8.9 - 10.3 mg/dL 9.6  9.7  9.3   Total Protein 6.5 - 8.1 g/dL 6.5  6.6  6.3   Total Bilirubin 0.0 - 1.2 mg/dL 0.4  0.5  0.4   Alkaline Phos 38 - 126 U/L 84  71  80   AST 15 - 41 U/L 19  19  15    ALT 0 - 44 U/L 11  13  7       Lab Results  Component Value Date   TOTALPROTELP 6.7 08/27/2023   TOTALPROTELP 6.7 08/27/2023   ALBUMINELP 3.9 08/27/2023   A1GS 0.2 08/27/2023   A2GS 0.7 08/27/2023   BETS 1.0 08/27/2023   GAMS 0.8 08/27/2023   MSPIKE Not Observed 08/27/2023   SPEI Comment 08/27/2023  Lab Results  Component Value Date   TIBC 382 06/02/2023   TIBC 297 05/21/2022   FERRITIN 22 (L) 06/02/2023   FERRITIN 41 05/21/2022   IRONPCTSAT 23 06/02/2023   IRONPCTSAT 18 05/21/2022   Lab Results  Component Value Date   LDH 139 05/21/2022   LDH 145 12/19/2021    STUDIES:   No results found.    HISTORY:   Past Medical History:  Diagnosis Date   Acute pulmonary embolism without acute cor pulmonale (HCC) 05/01/2022   Anemia 05/20/2022   Chemotherapy follow-up examination 04/21/2023   Degenerative disc disease, cervical 08/27/2023   Depression    mild   Family history of breast cancer 01/22/2021   Family history of prostate cancer 01/22/2021   Genetic testing 02/22/2021   No pathogenic variants detected in [facility] Multi-Cancer +RNA Panel.  Variants of uncertain significance detected in APC (c.681C>G (p.Asp227Glu)), CDKN1C (c.610C>G (p.Pro204Ala)), and FH (c.1151C>T (p.Ala384Val)).  The report date is February 18, 2021.       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) 12/19/2021   Melanoma of skin (HCC) 12/01/2019   Multiple dysplastic nevi 05/30/2020   Peripheral neuropathy 05/09/2023   Port-A-Cath in place 07/28/2022   Pruritic rash 02/18/2022   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   Right shoulder pain 02/18/2022   Skin cancer    melanoma    Past Surgical History:  Procedure Laterality Date   APPLICATION OF CRANIAL NAVIGATION N/A 01/03/2022   Procedure: APPLICATION OF CRANIAL NAVIGATION;  Surgeon: Augusto Blonder, MD;  Location: [facility] OR;  Service: Neurosurgery;  Laterality: N/A;   CRANIOTOMY Left 01/03/2022   Procedure: Stereotactic Left temporal craniotomy for resection of tumor;  Surgeon: Augusto Blonder, MD;  Location: [facility] OR;  Service: Neurosurgery;  Laterality: Left;   MELANOMA EXCISION WITH SENTINEL LYMPH NODE BIOPSY Right 2021   SEPTOPLASTY  05/13/2023   Dr Leveda Rea   SPINAL FUSION W/ LUQUE UNIT ROD  2003    Family History  Problem Relation Age of Onset   Prostate cancer Father        dx 65s   Breast cancer Maternal Aunt        dx after 59; bilateral mastectomy   Stomach cancer Maternal Grandfather        dx 38s   Breast cancer Cousin 75       maternal male cousin; mets    Social History:  reports that he quit smoking about 27 years ago. His smoking use included cigarettes. He has never used smokeless tobacco. He reports current alcohol use of about 14.0 standard drinks of alcohol per week. He reports that he does not currently use drugs.The patient is alone today.  Allergies:  Allergies  Allergen Reactions    Sulfa Antibiotics Rash   Testosterone  Itching    Gel    Current Medications: Current Outpatient Medications  Medication Sig Dispense Refill   acebutolol  (SECTRAL ) 200 MG capsule TAKE ONE CAPSULE BY MOUTH EVERY DAY 90 capsule  1   acyclovir  (ZOVIRAX ) 400 MG tablet Take 400 mg by mouth 3 (three) times daily.     amitriptyline  (ELAVIL ) 25 MG tablet Take 50 mg by mouth at bedtime.     ergocalciferol (VITAMIN D2) 1.25 MG (50000 UT) capsule Take 50,000 Units by mouth every Wednesday.     folic acid  (FOLVITE ) 1 MG tablet Take 1 tablet (1 mg total) by mouth daily. 30 tablet 5   HYDROcodone -acetaminophen  (NORCO/VICODIN) 5-325 MG tablet Take 1 tablet by mouth every 4 (four) hours as needed for moderate pain (pain score 4-6). 150 tablet 0   hydrOXYzine  (ATARAX ) 25 MG tablet TAKE ONE TABLET BY MOUTH 3 TIMES A DAY AS NEEDED FOR ITCHING 60 tablet 3   lidocaine -prilocaine  (EMLA ) cream Apply 1 Application topically as needed. 30 g 0   lisinopril  (ZESTRIL ) 20 MG tablet TAKE ONE TABLET BY MOUTH DAILY 30 tablet 1   pregabalin  (LYRICA ) 75 MG capsule Take 1 capsule (75 mg total) by mouth 3 (three) times daily. 90 capsule 5   XARELTO  20 MG TABS tablet TAKE ONE TABLET BY MOUTH EVERY DAY WITH SUPPER. START AFTER 3 WEEKS OF 15MG 2 TIMES A DAY 30 tablet 3   No current facility-administered medications for this visit.   Facility-Administered Medications Ordered in Other Visits  Medication Dose Route Frequency Provider Last Rate Last Admin   heparin  lock flush 100 unit/mL  500 Units Intracatheter Once PRN Nolia Baumgartner, MD       nivolumab  (OPDIVO ) 480 mg in sodium chloride  0.9 % 100 mL chemo infusion  480 mg Intravenous Once Nolia Baumgartner, MD 296 mL/hr at 12/18/23 1058 480 mg at 12/18/23 1058   sodium chloride  flush (NS) 0.9 % injection 10 mL  10 mL Intracatheter PRN Nolia Baumgartner, MD

## 2023-12-14 NOTE — Assessment & Plan Note (Signed)
 Non occlusive segmental pulmonary embolism in the inferior left upper lobe diagnosed in September 2023.  Due to his malignancy, he remains on rivaroxaban 20 mg daily.  He denies abnormal bruising or bleeding.

## 2023-12-14 NOTE — Assessment & Plan Note (Addendum)
 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  There was still a question as to whether this represented a metastatic lesion, rather than a primary, based on the presence of 2 nodules and no epidermal involvement.  MRI head and CT chest, abdomen and pelvis in June 2021 were negative for metastatic disease.   No adjuvant therapy was recommended.  PET scan in September was negative for metastatic disease.    In June 2023, he was found to have widespread metastatic disease, including to the brain.  CT scans in January 2024 revealed resolved subcarinal and bilateral hilar lymphadenopathy. Stable to decreased left pulmonary nodules. No new or progressive metastatic disease in the chest, abdomen or pelvis. Most recent CT scans in July 2024 stable. He will have CT chest, abdomen and pelvis in 3 weeks prior to his next cycle of nivolumab .

## 2023-12-14 NOTE — Assessment & Plan Note (Signed)
 He presented with a new area of concern of the left mid back in June 2023.  A superficial nodule was noted approximately 1 cm to the right of his upper thoracic spine,and it had increased in size. Biopsy which revealed metastatic malignant melanoma.   He had several hypermetabolic subcutaneous nodules seen on PET, which have resolved with treatment. There are no new skin lesions.

## 2023-12-17 DIAGNOSIS — C4490 Unspecified malignant neoplasm of skin, unspecified: Secondary | ICD-10-CM | POA: Insufficient documentation

## 2023-12-18 ENCOUNTER — Other Ambulatory Visit: Payer: Self-pay | Admitting: Pharmacist

## 2023-12-18 ENCOUNTER — Encounter: Payer: Self-pay | Admitting: Hematology and Oncology

## 2023-12-18 ENCOUNTER — Inpatient Hospital Stay

## 2023-12-18 ENCOUNTER — Other Ambulatory Visit: Payer: Self-pay | Admitting: Oncology

## 2023-12-18 ENCOUNTER — Inpatient Hospital Stay: Attending: Hematology and Oncology

## 2023-12-18 ENCOUNTER — Inpatient Hospital Stay (HOSPITAL_BASED_OUTPATIENT_CLINIC_OR_DEPARTMENT_OTHER): Attending: Hematology and Oncology | Admitting: Hematology and Oncology

## 2023-12-18 ENCOUNTER — Encounter: Payer: Self-pay | Admitting: Oncology

## 2023-12-18 VITALS — BP 117/82 | HR 92 | Temp 97.8°F | Resp 18 | Ht 69.0 in | Wt 172.5 lb

## 2023-12-18 DIAGNOSIS — Z95828 Presence of other vascular implants and grafts: Secondary | ICD-10-CM

## 2023-12-18 DIAGNOSIS — Z79620 Long term (current) use of immunosuppressive biologic: Secondary | ICD-10-CM | POA: Insufficient documentation

## 2023-12-18 DIAGNOSIS — C792 Secondary malignant neoplasm of skin: Secondary | ICD-10-CM

## 2023-12-18 DIAGNOSIS — C439 Malignant melanoma of skin, unspecified: Secondary | ICD-10-CM

## 2023-12-18 DIAGNOSIS — C787 Secondary malignant neoplasm of liver and intrahepatic bile duct: Secondary | ICD-10-CM | POA: Insufficient documentation

## 2023-12-18 DIAGNOSIS — C7931 Secondary malignant neoplasm of brain: Secondary | ICD-10-CM | POA: Diagnosis not present

## 2023-12-18 DIAGNOSIS — I2699 Other pulmonary embolism without acute cor pulmonale: Secondary | ICD-10-CM

## 2023-12-18 DIAGNOSIS — M79671 Pain in right foot: Secondary | ICD-10-CM | POA: Diagnosis not present

## 2023-12-18 DIAGNOSIS — C7800 Secondary malignant neoplasm of unspecified lung: Secondary | ICD-10-CM

## 2023-12-18 DIAGNOSIS — C4361 Malignant melanoma of right upper limb, including shoulder: Secondary | ICD-10-CM | POA: Insufficient documentation

## 2023-12-18 DIAGNOSIS — Z5112 Encounter for antineoplastic immunotherapy: Secondary | ICD-10-CM | POA: Diagnosis not present

## 2023-12-18 LAB — CBC WITH DIFFERENTIAL (CANCER CENTER ONLY)
Abs Immature Granulocytes: 0.01 10*3/uL (ref 0.00–0.07)
Basophils Absolute: 0.1 10*3/uL (ref 0.0–0.1)
Basophils Relative: 1 %
Eosinophils Absolute: 0.1 10*3/uL (ref 0.0–0.5)
Eosinophils Relative: 3 %
HCT: 37.8 % — ABNORMAL LOW (ref 39.0–52.0)
Hemoglobin: 12.7 g/dL — ABNORMAL LOW (ref 13.0–17.0)
Immature Granulocytes: 0 %
Lymphocytes Relative: 35 %
Lymphs Abs: 1.5 10*3/uL (ref 0.7–4.0)
MCH: 32.2 pg (ref 26.0–34.0)
MCHC: 33.6 g/dL (ref 30.0–36.0)
MCV: 95.7 fL (ref 80.0–100.0)
Monocytes Absolute: 0.4 10*3/uL (ref 0.1–1.0)
Monocytes Relative: 10 %
Neutro Abs: 2.1 10*3/uL (ref 1.7–7.7)
Neutrophils Relative %: 51 %
Platelet Count: 247 10*3/uL (ref 150–400)
RBC: 3.95 MIL/uL — ABNORMAL LOW (ref 4.22–5.81)
RDW: 12 % (ref 11.5–15.5)
WBC Count: 4.2 10*3/uL (ref 4.0–10.5)
nRBC: 0 % (ref 0.0–0.2)

## 2023-12-18 LAB — CMP (CANCER CENTER ONLY)
ALT: 11 U/L (ref 0–44)
AST: 19 U/L (ref 15–41)
Albumin: 4.4 g/dL (ref 3.5–5.0)
Alkaline Phosphatase: 84 U/L (ref 38–126)
Anion gap: 10 (ref 5–15)
BUN: 21 mg/dL — ABNORMAL HIGH (ref 6–20)
CO2: 26 mmol/L (ref 22–32)
Calcium: 9.6 mg/dL (ref 8.9–10.3)
Chloride: 105 mmol/L (ref 98–111)
Creatinine: 0.94 mg/dL (ref 0.61–1.24)
GFR, Estimated: >60 mL/min (ref >60)
Glucose, Bld: 150 mg/dL — ABNORMAL HIGH (ref 70–99)
Potassium: 4.2 mmol/L (ref 3.5–5.1)
Sodium: 141 mmol/L (ref 135–145)
Total Bilirubin: 0.4 mg/dL (ref 0.0–1.2)
Total Protein: 6.5 g/dL (ref 6.5–8.1)

## 2023-12-18 LAB — TSH: TSH: 2.018 u[IU]/mL (ref 0.350–4.500)

## 2023-12-18 MED ORDER — HEPARIN SOD (PORK) LOCK FLUSH 100 UNIT/ML IV SOLN
500.0000 [IU] | Freq: Once | INTRAVENOUS | Status: AC | PRN
  Administered 2023-12-18: 500 [IU]

## 2023-12-18 MED ORDER — SODIUM CHLORIDE 0.9 % IV SOLN
480.0000 mg | Freq: Once | INTRAVENOUS | Status: AC
  Administered 2023-12-18: 480 mg via INTRAVENOUS
  Filled 2023-12-18: qty 48

## 2023-12-18 MED ORDER — SODIUM CHLORIDE 0.9% FLUSH
10.0000 mL | INTRAVENOUS | Status: DC | PRN
  Administered 2023-12-18: 10 mL

## 2023-12-18 MED ORDER — HEPARIN SOD (PORK) LOCK FLUSH 100 UNIT/ML IV SOLN
500.0000 [IU] | Freq: Once | INTRAVENOUS | Status: DC | PRN
Start: 2023-12-18 — End: 2023-12-18

## 2023-12-18 MED ORDER — SODIUM CHLORIDE 0.9% FLUSH: 10.0000 mL | INTRAVENOUS | Status: DC | PRN

## 2023-12-18 MED ORDER — SODIUM CHLORIDE 0.9 % IV SOLN: Freq: Once | INTRAVENOUS | Status: AC

## 2023-12-18 NOTE — Patient Instructions (Signed)
 Nivolumab Injection What is this medication? NIVOLUMAB (nye VOL ue mab) treats some types of cancer. It works by helping your immune system slow or stop the spread of cancer cells. It is a monoclonal antibody. This medicine may be used for other purposes; ask your health care provider or pharmacist if you have questions. COMMON BRAND NAME(S): Opdivo What should I tell my care team before I take this medication? They need to know if you have any of these conditions: Allogeneic stem cell transplant (uses someone else's stem cells) Autoimmune diseases, such as Crohn disease, ulcerative colitis, lupus History of chest radiation Nervous system problems, such as Guillain-Barre syndrome or myasthenia gravis Organ transplant An unusual or allergic reaction to nivolumab, other medications, foods, dyes, or preservatives Pregnant or trying to get pregnant Breast-feeding How should I use this medication? This medication is infused into a vein. It is given in a hospital or clinic setting. A special MedGuide will be given to you before each treatment. Be sure to read this information carefully each time. Talk to your care team about the use of this medication in children. While it may be prescribed for children as young as 12 years for selected conditions, precautions do apply. Overdosage: If you think you have taken too much of this medicine contact a poison control center or emergency room at once. NOTE: This medicine is only for you. Do not share this medicine with others. What if I miss a dose? Keep appointments for follow-up doses. It is important not to miss your dose. Call your care team if you are unable to keep an appointment. What may interact with this medication? Interactions have not been studied. This list may not describe all possible interactions. Give your health care provider a list of all the medicines, herbs, non-prescription drugs, or dietary supplements you use. Also tell them if you  smoke, drink alcohol, or use illegal drugs. Some items may interact with your medicine. What should I watch for while using this medication? Your condition will be monitored carefully while you are receiving this medication. You may need blood work while taking this medication. This medication may cause serious skin reactions. They can happen weeks to months after starting the medication. Contact your care team right away if you notice fevers or flu-like symptoms with a rash. The rash may be red or purple and then turn into blisters or peeling of the skin. You may also notice a red rash with swelling of the face, lips, or lymph nodes in your neck or under your arms. Tell your care team right away if you have any change in your eyesight. Talk to your care team if you are pregnant or think you might be pregnant. A negative pregnancy test is required before starting this medication. A reliable form of contraception is recommended while taking this medication and for 5 months after the last dose. Talk to your care team about effective forms of contraception. Do not breast-feed while taking this medication and for 5 months after the last dose. What side effects may I notice from receiving this medication? Side effects that you should report to your care team as soon as possible: Allergic reactions--skin rash, itching, hives, swelling of the face, lips, tongue, or throat Dry cough, shortness of breath or trouble breathing Eye pain, redness, irritation, or discharge with blurry or decreased vision Heart muscle inflammation--unusual weakness or fatigue, shortness of breath, chest pain, fast or irregular heartbeat, dizziness, swelling of the ankles, feet, or hands Hormone  gland problems--headache, sensitivity to light, unusual weakness or fatigue, dizziness, fast or irregular heartbeat, increased sensitivity to cold or heat, excessive sweating, constipation, hair loss, increased thirst or amount of urine,  tremors or shaking, irritability Infusion reactions--chest pain, shortness of breath or trouble breathing, feeling faint or lightheaded Kidney injury (glomerulonephritis)--decrease in the amount of urine, red or dark brown urine, foamy or bubbly urine, swelling of the ankles, hands, or feet Liver injury--right upper belly pain, loss of appetite, nausea, light-colored stool, dark yellow or brown urine, yellowing skin or eyes, unusual weakness or fatigue Pain, tingling, or numbness in the hands or feet, muscle weakness, change in vision, confusion or trouble speaking, loss of balance or coordination, trouble walking, seizures Rash, fever, and swollen lymph nodes Redness, blistering, peeling, or loosening of the skin, including inside the mouth Sudden or severe stomach pain, bloody diarrhea, fever, nausea, vomiting Side effects that usually do not require medical attention (report these to your care team if they continue or are bothersome): Bone, joint, or muscle pain Diarrhea Fatigue Loss of appetite Nausea Skin rash This list may not describe all possible side effects. Call your doctor for medical advice about side effects. You may report side effects to FDA at 1-800-FDA-1088. Where should I keep my medication? This medication is given in a hospital or clinic. It will not be stored at home. NOTE: This sheet is a summary. It may not cover all possible information. If you have questions about this medicine, talk to your doctor, pharmacist, or health care provider.  2024 Elsevier/Gold Standard (2021-10-21 00:00:00)

## 2023-12-18 NOTE — Assessment & Plan Note (Signed)
 Persistent right foot pain which has been attributed to neuropathy, but increased pain with walking.  I will obtain plain films today for further evaluation.

## 2023-12-19 LAB — T4: T4, Total: 6.9 ug/dL (ref 4.5–12.0)

## 2023-12-21 ENCOUNTER — Ambulatory Visit: Admitting: Cardiology

## 2023-12-22 ENCOUNTER — Ambulatory Visit

## 2023-12-25 ENCOUNTER — Other Ambulatory Visit: Payer: Self-pay | Admitting: Hematology and Oncology

## 2023-12-25 DIAGNOSIS — G629 Polyneuropathy, unspecified: Secondary | ICD-10-CM

## 2023-12-25 DIAGNOSIS — M5126 Other intervertebral disc displacement, lumbar region: Secondary | ICD-10-CM

## 2023-12-25 DIAGNOSIS — M79604 Pain in right leg: Secondary | ICD-10-CM

## 2023-12-29 ENCOUNTER — Encounter: Payer: Self-pay | Admitting: Oncology

## 2024-01-02 IMAGING — MR MR HEAD WO/W CM
12 series · 48 of 48 positions shown · IV contrast (multihance)
Comparison: 12/18/2021

CLINICAL DATA: Metastasis to brain.  SRS protocol.

EXAM:
MRI HEAD WITHOUT AND WITH CONTRAST
TECHNIQUE: Multiplanar, multiecho pulse sequences of the brain and surrounding
structures were obtained without and with intravenous contrast.
CONTRAST:  15mL MULTIHANCE GADOBENATE DIMEGLUMINE 529 MG/ML IV SOLN

[Series 2: FLAIR · sagittal · 3.0mm · 0.75mm/px · 3 of 40 slices shown (1 of 2)]
[im 1/40]
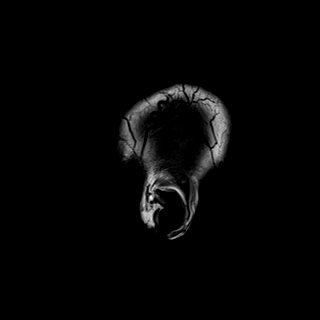
[im 20/40]
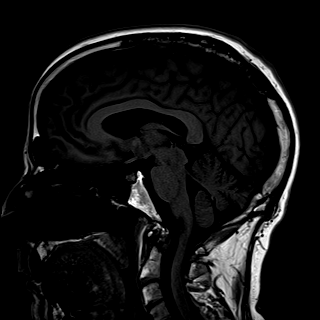
[im 40/40]
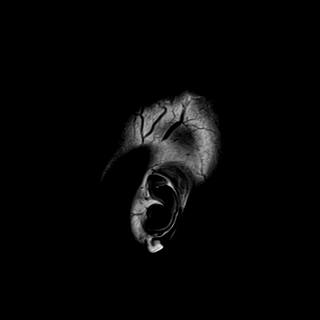

[Series 3: DWI · axial · 3.0mm · 1.50mm/px · z∈[-67,+89]mm · 4 of 82 slices shown (1 of 2)]
[im 1/82]
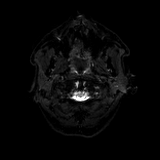
[im 28/82]
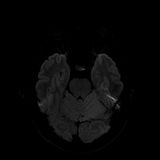
[im 55/82]
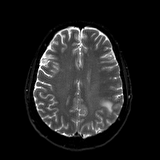
[im 82/82]
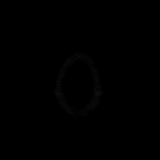

[Series 4: DWI · axial · 3.0mm · 1.50mm/px · z∈[-67,+89]mm · 2 of 41 slices shown (2 of 2)]
[im 1/41]
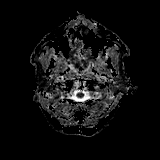
[im 41/41]
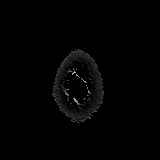

[Series 5: T2 · axial · 5.0mm · 0.57mm/px · 1 of 27 slices shown (1 of 2)]
[im 1/27]
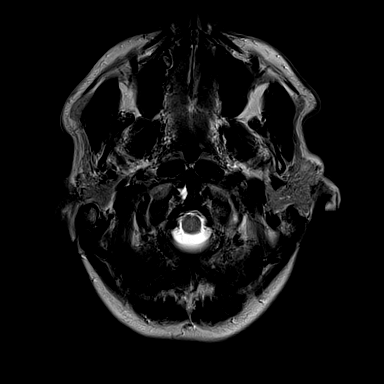

[Series 7: swi_images · axial · 1.5mm · 0.90mm/px · z∈[-66,+88]mm · 5 of 104 slices shown]
[im 1/104]
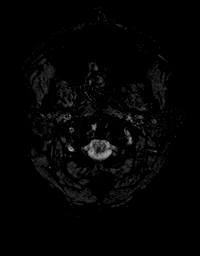
[im 26/104]
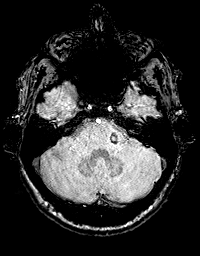
[im 52/104]
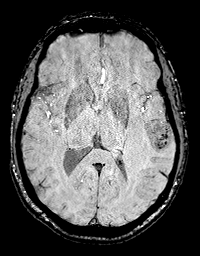
[im 78/104]
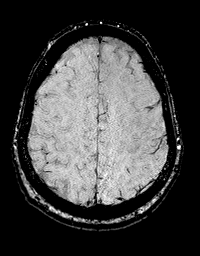
[im 104/104]
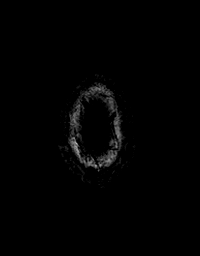

[Series 8: FLAIR · axial · 3.0mm · 0.86mm/px · z∈[-85,+107]mm · 3 of 64 slices shown (2 of 2)]
[im 1/64]
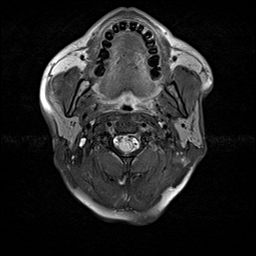
[im 32/64]
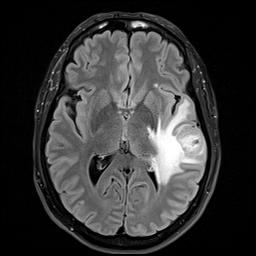
[im 64/64]
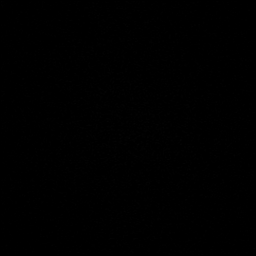

[Series 9: T2 · axial · non-contrast · 1.0mm · 0.86mm/px · z∈[-71,+101]mm · 8 of 176 slices shown (2 of 2)]
[im 1/176]
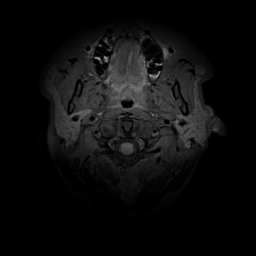
[im 26/176]
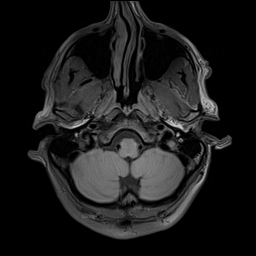
[im 51/176]
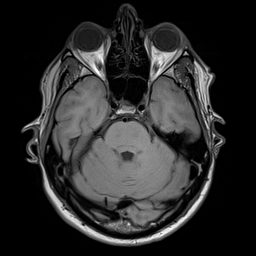
[im 76/176]
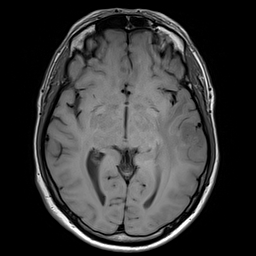
[im 101/176]
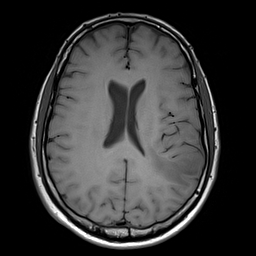
[im 126/176]
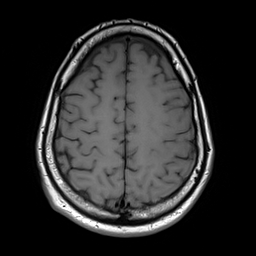
[im 151/176]
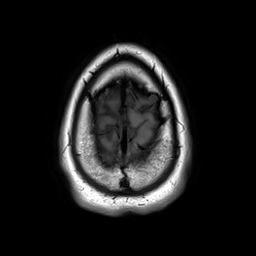
[im 176/176]
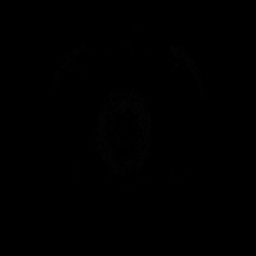

[Series 10: T2 post-contrast · coronal · 3.0mm · 0.57mm/px · 2 of 50 slices shown (1 of 2)]
[im 1/50]
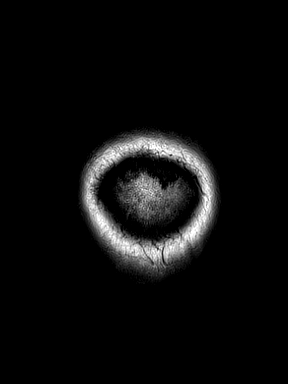
[im 50/50]
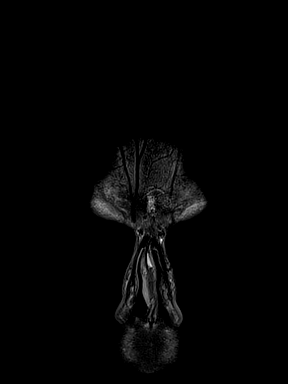

[Series 11: T2 post-contrast · axial · 1.0mm · 0.86mm/px · z∈[-71,+101]mm · 8 of 176 slices shown (2 of 2)]
[im 1/176]
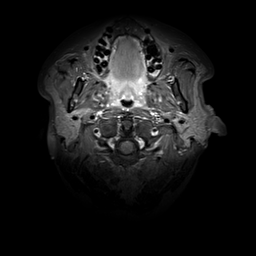
[im 26/176]
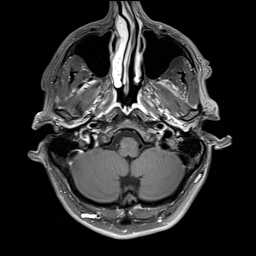
[im 51/176]
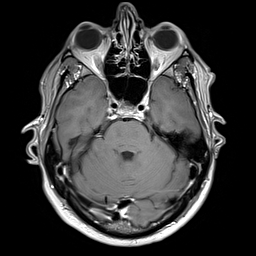
[im 76/176]
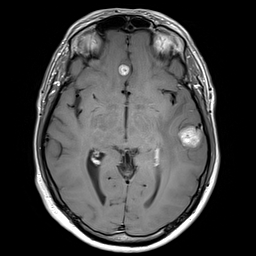
[im 101/176]
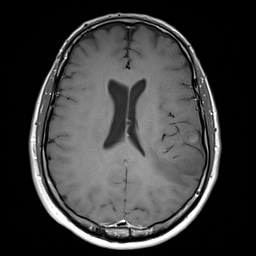
[im 126/176]
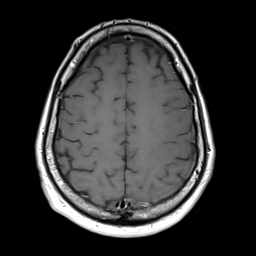
[im 151/176]
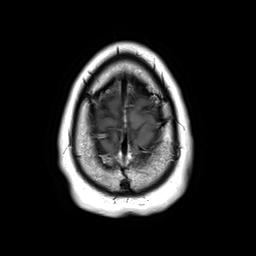
[im 176/176]
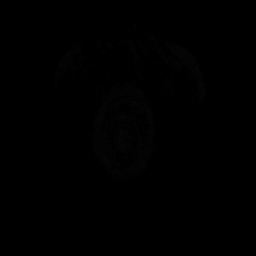

[Series 12: T1 post-contrast · axial · 1.0mm · 0.75mm/px · z∈[-65,+94]mm · 8 of 160 slices shown (1 of 2)]
[im 1/160]
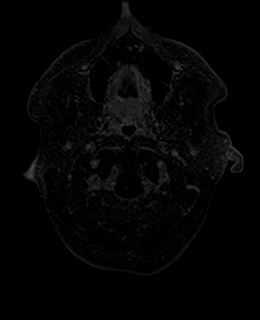
[im 23/160]
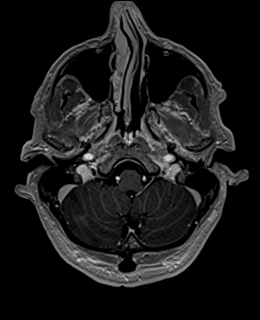
[im 46/160]
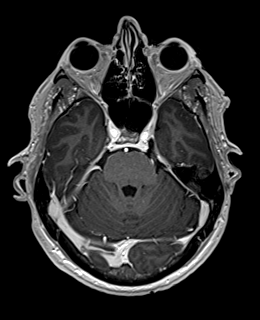
[im 69/160]
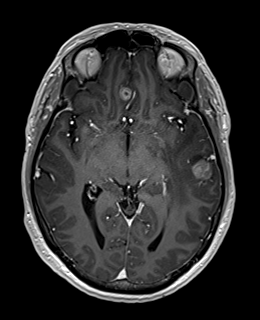
[im 91/160]
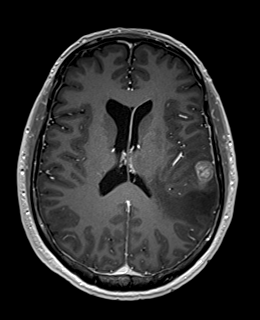
[im 114/160]
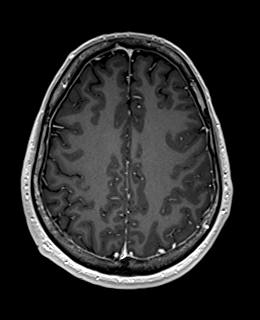
[im 137/160]
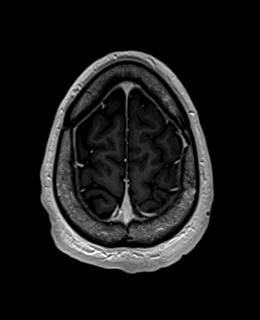
[im 160/160]
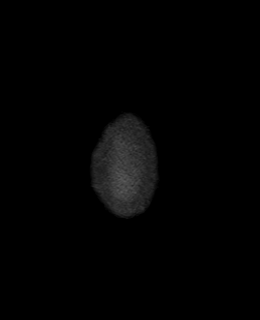

[Series 13: T1 post-contrast · coronal · 3.0mm · 0.57mm/px · 2 of 50 slices shown (2 of 2)]
[im 1/50]
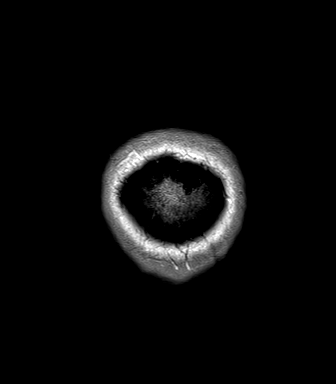
[im 50/50]
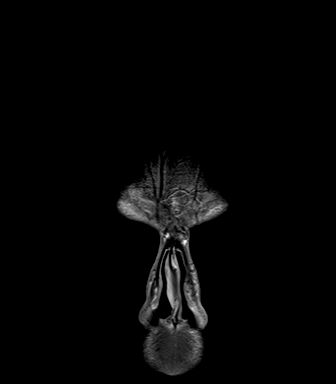

[Series 14: FLAIR post-contrast · sagittal · 3.0mm · 0.75mm/px · 2 of 40 slices shown]
[im 1/40]
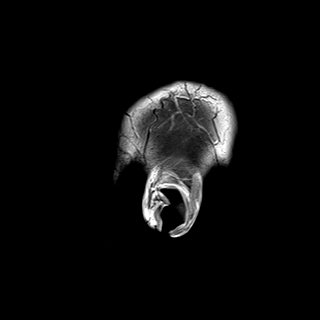
[im 40/40]
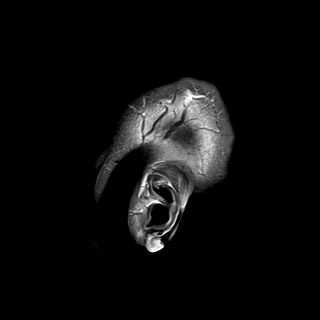

[48 of 48 positions shown; findings below may reference images not displayed]

FINDINGS: Brain: There are 4 unchanged intraparenchymal masses:

1. Left temporal lobe, 3.0 cm series 11, image 85
2. Left frontal lobe 3 mm, image 79
3. Paramedian right frontal lobe 8 mm, image 75
4. Left middle cerebellar peduncle, 9 mm, image 41.

Edema surrounding the left temporal lesion is unchanged with
regional mass effect but no midline shift. Evidence of chronic
hemorrhage within the left temporal and left cerebellar peduncular
lesion. No new lesions. No acute infarct. No extra-axial collection.

Vascular: Normal flow voids.

Skull and upper cervical spine: Normal marrow signal.

Sinuses/Orbits: Negative.

Other: None
IMPRESSION: Unchanged size and appearance of 4 intraparenchymal metastases. No
new lesions.

## 2024-01-04 ENCOUNTER — Other Ambulatory Visit: Payer: Self-pay | Admitting: Hematology and Oncology

## 2024-01-04 ENCOUNTER — Telehealth: Payer: Self-pay | Admitting: Oncology

## 2024-01-04 DIAGNOSIS — I10 Essential (primary) hypertension: Secondary | ICD-10-CM

## 2024-01-04 NOTE — Telephone Encounter (Signed)
 CT C/A/P has been scheduled for 01/13/24 @ 10 am; Checkingin @ 9 am at [facility] to be performed same day.  Notified pt of date,time, instructions and location.

## 2024-01-04 NOTE — Addendum Note (Signed)
 Addended byBETHA SHERON FALLING on: 01/04/2024 02:38 PM   Modules accepted: Orders

## 2024-01-06 ENCOUNTER — Encounter: Payer: Self-pay | Admitting: Oncology

## 2024-01-06 ENCOUNTER — Ambulatory Visit

## 2024-01-06 VITALS — BP 118/76 | HR 73 | Ht 69.5 in | Wt 170.0 lb

## 2024-01-06 DIAGNOSIS — I2510 Atherosclerotic heart disease of native coronary artery without angina pectoris: Secondary | ICD-10-CM | POA: Diagnosis not present

## 2024-01-06 DIAGNOSIS — R002 Palpitations: Secondary | ICD-10-CM | POA: Insufficient documentation

## 2024-01-06 NOTE — Patient Instructions (Signed)
 Medication Instructions:  Your physician recommends that you continue on your current medications as directed. Please refer to the Current Medication list given to you today.  *If you need a refill on your cardiac medications before your next appointment, please call your pharmacy*   Lab Work: None ordered If you have labs (blood work) drawn today and your tests are completely normal, you will receive your results only by: MyChart Message (if you have MyChart) OR A paper copy in the mail If you have any lab test that is abnormal or we need to change your treatment, we will call you to review the results.   Testing/Procedures: None ordered   Follow-Up: At [facility], you and your health needs are our priority.  As part of our continuing mission to provide you with exceptional heart care, we have created designated Provider Care Teams.  These Care Teams include your primary Cardiologist (physician) and Advanced Practice Providers (APPs -  Physician Assistants and Nurse Practitioners) who all work together to provide you with the care you need, when you need it.  We recommend signing up for the patient portal called "MyChart".  Sign up information is provided on this After Visit Summary.  MyChart is used to connect with patients for Virtual Visits (Telemedicine).  Patients are able to view lab/test results, encounter notes, upcoming appointments, etc.  Non-urgent messages can be sent to your provider as well.   To learn more about what you can do with MyChart, go to [email or web address].    Your next appointment:   Follow up as needed  The format for your next appointment:   In Person  Provider:   Huntley Dec, MD    Other Instructions none  Important Information About Sugar

## 2024-01-06 NOTE — Assessment & Plan Note (Addendum)
 Coronary atherosclerosis on CT imaging in January 2020 for follow-up No functional status. Good biventricular function on echocardiogram from February 2024.   Continue with risk reduction strategies with healthy diet, lifestyle. Last lipid panel from January 2025 appears well-controlled.  Remains on Eliquis given his history of PE, hence no requirement for aspirin at this time.

## 2024-01-06 NOTE — Progress Notes (Signed)
 Cardiology Consultation:    Date:  01/06/2024   ID:  Xavier Jones, DOB [birth year], MRN [id number]  PCP:  Xavier Therisa RAMAN, NP  Cardiologist:  Xavier SAUNDERS Pericles Carmicheal, MD   Referring MD: Xavier Therisa RAMAN, NP   No chief complaint on file.    ASSESSMENT AND PLAN:   [name] 53 year old male  history of metastatic melanoma, with mets to the brain s/p stereotactic neuro surgery June 2023, PE-remains on chronic anticoagulation since October 2023, depression, chronic back pain s/p spinal fusion surgery, palpitations attributed to PVCs and PACs and controlled using acebutolol , workup with echocardiogram February 2024 noting normal biventricular function with mild concentric LVH and grade 1 diastolic dysfunction.  Coronary atherosclerosis noted on CT chest abdomen pelvis from January 2024 at [facility]    Problem List Items Addressed This Visit     Palpitations - Primary   Symptomatic PACs and PVCs as noted on his Apple smart watch couple years ago symptoms have been well-controlled on acebutolol  200 mg once daily.  Good functional status.       Relevant Orders   EKG 12-Lead (Completed)   Coronary atherosclerosis   Coronary atherosclerosis on CT imaging in January 2020 for follow-up No functional status. Good biventricular function on echocardiogram from February 2024.   Continue with risk reduction strategies with healthy diet, lifestyle. Last lipid panel from January 2025 appears well-controlled.  Remains on Eliquis given his history of PE, hence no requirement for aspirin at this time.       Continue to follow-up with PCP and see us  on an as-needed basis.   History of Present Illness:    Xavier Jones is a 53 y.o. male who is being seen today for follow-up visit. PCP Xavier Therisa RAMAN, NP. Last visit with us  in the office was 12/25/2022 with Xavier Hoover, NP-C.  Pleasant man here for the visit by himself.  Lives by himself at home.  Keeps himself busy with day-to-day  activities  Has history of metastatic melanoma, with mets to the brain s/p stereotactic neuro surgery June 2023, PE-remains on chronic anticoagulation since October 2023, depression, chronic back pain s/p spinal fusion surgery, palpitations attributed to PVCs and PACs and controlled using acebutolol , workup with echocardiogram February 2024 noting normal biventricular function with mild concentric LVH and grade 1 diastolic dysfunction.  Coronary atherosclerosis noted on CT chest abdomen pelvis from January 2024 at [facility]  no significant cardiac symptoms.  Good functional status at this time. Denies any chest pain, shortness of breath, orthopnea, paroxysmal nocturnal dyspnea. No palpitations, lightheadedness, dizziness or syncopal episodes. Has been taking Sectral  [acebutolol ] 200 mg once daily.  Continues to follow-up with oncologist regularly.  Blood work from 12/18/2023 thyroid  panel TSH normal 2.01, free T46.9 CMP unremarkable with BUN 21 and creatinine 0.94, EGFR greater than 60.  Normal transaminases and alkaline phosphatase. CBC mild anemia with hemoglobin 12.7 and hematocrit 37.8, platelets 247.    EKG in the clinic today shows sinus rhythm heart rate 73/min, PR interval 154 ms, QRS duration 94 ms, QTc 4 1 ms.  No ischemic changes.  Last cholesterol panel from August 06, 2023 total cholesterol 169, HDL 81, LDL 77, triglycerides 56.   Past Medical History:  Diagnosis Date   Acute pulmonary embolism without acute cor pulmonale (HCC) 05/01/2022   Anemia 05/20/2022   Chemotherapy follow-up examination 04/21/2023   Degenerative disc disease, cervical 08/27/2023   Depression    mild   Family history of breast cancer  01/22/2021   Family history of prostate cancer 01/22/2021   Genetic testing 02/22/2021   No pathogenic variants detected in [facility] Multi-Cancer +RNA Panel.  Variants of uncertain significance detected in APC (c.681C>G (p.Asp227Glu)), CDKN1C (c.610C>G  (p.Pro204Ala)), and FH (c.1151C>T (p.Ala384Val)).  The report date is February 18, 2021.       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) 12/19/2021   Melanoma of skin (HCC) 12/01/2019   Multiple dysplastic nevi 05/30/2020   Peripheral neuropathy 05/09/2023   Port-A-Cath in place 07/28/2022   Pruritic rash 02/18/2022   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   Right shoulder pain 02/18/2022   Skin cancer    melanoma    Past Surgical History:  Procedure Laterality Date   APPLICATION OF CRANIAL NAVIGATION N/A 01/03/2022   Procedure: APPLICATION OF CRANIAL NAVIGATION;  Surgeon: Lanis Pupa, MD;  Location: [facility] OR;  Service: Neurosurgery;  Laterality: N/A;   CRANIOTOMY Left 01/03/2022   Procedure: Stereotactic Left temporal craniotomy for resection of tumor;  Surgeon: Lanis Pupa, MD;  Location: [facility] OR;  Service: Neurosurgery;  Laterality: Left;   MELANOMA EXCISION WITH SENTINEL LYMPH NODE BIOPSY Right 2021   SEPTOPLASTY  05/13/2023   Dr Xavier Jones   SPINAL FUSION W/ LUQUE UNIT ROD  2003    Current Medications: Current Meds  Medication Sig   acebutolol  (SECTRAL ) 200 MG capsule TAKE ONE CAPSULE BY MOUTH EVERY DAY   acyclovir  (ZOVIRAX ) 400 MG tablet Take 400 mg by mouth 3 (three) times daily.   amitriptyline  (ELAVIL ) 25 MG tablet Take 50 mg by mouth at bedtime.   ergocalciferol (VITAMIN D2) 1.25 MG (50000 UT) capsule Take 50,000 Units by  mouth every Wednesday.   folic acid  (FOLVITE ) 1 MG tablet Take 1 tablet (1 mg total) by mouth daily.   HYDROcodone -acetaminophen  (NORCO/VICODIN) 5-325 MG tablet Take 1 tablet by mouth every 4 (four) hours as needed for moderate pain (pain score 4-6).   hydrOXYzine  (ATARAX ) 25 MG tablet TAKE ONE TABLET BY MOUTH 3 TIMES A DAY AS NEEDED FOR ITCHING   lidocaine -prilocaine  (EMLA ) cream Apply 1 Application topically as needed.   lisinopril  (ZESTRIL ) 20 MG tablet TAKE ONE TABLET BY MOUTH DAILY   pregabalin  (LYRICA ) 75 MG capsule Take 1 capsule (75 mg total) by mouth 3 (three) times daily.   XARELTO  20 MG TABS tablet TAKE ONE TABLET BY MOUTH EVERY DAY WITH SUPPER. START AFTER 3 WEEKS OF 15MG 2 TIMES A DAY     Allergies:   Sulfa antibiotics and Testosterone    Social History   Socioeconomic History   Marital status: Divorced    Spouse name: Not on file   Number of children: 0   Years of education: Not on file   Highest education level: Not on file  Occupational History   Not on file  Tobacco Use   Smoking status: Former    Current packs/day: 0.00    Types: Cigarettes    Quit date: 1998    Years since quitting: 33.5  Smokeless tobacco: Never  Vaping Use   Vaping status: Never Used  Substance and Sexual Activity   Alcohol use: Yes    Alcohol/week: 14.0 standard drinks of alcohol    Types: 14 Cans of beer per week    Comment: 2-3 beers per evening   Drug use: Not Currently   Sexual activity: Not on file  Other Topics Concern   Not on file  Social History Narrative   Not on file   Social Drivers of Health   Financial Resource Strain: Not on file  Food Insecurity: Not on file  Transportation Needs: Not on file  Physical Activity: Not on file  Stress: Not on file  Social Connections: Not on file     Family History: The patient's family history includes Breast cancer in his maternal aunt; Breast cancer (age of onset: 56) in his cousin; Prostate cancer in his father; Stomach  cancer in his maternal grandfather. ROS:   Please see the history of present illness.    All 14 point review of systems negative except as described per history of present illness.  EKGs/Labs/Other Studies Reviewed:    The following studies were reviewed today:   EKG:  EKG Interpretation Date/Time:  Wednesday January 06 2024 14:08:43 EDT Ventricular Rate:  73 PR Interval:  154 QRS Duration:  94 QT Interval:  364 QTC Calculation: 401 R Axis:   2  Text Interpretation: Normal sinus rhythm Normal ECG When compared with ECG of 13-Dec-2001 03:53, Questionable change in QRS axis Confirmed by Liborio Hai reddy [phone]) on 01/06/2024 2:27:54 PM    Recent Labs: 12/18/2023: ALT 11; BUN 21; Creatinine 0.94; Hemoglobin 12.7; Platelet Count 247; Potassium 4.2; Sodium 141; TSH 2.018  Recent Lipid Panel No results found for: CHOL, TRIG, HDL, CHOLHDL, VLDL, LDLCALC, LDLDIRECT  Physical Exam:    VS:  BP 118/76 (BP Location: Left Arm)   Pulse 73   Ht 5' 9.5 (1.765 m)   Wt 170 lb (77.1 kg)   SpO2 95%   BMI 24.74 kg/m     Wt Readings from Last 3 Encounters:  01/06/24 170 lb (77.1 kg)  12/18/23 172 lb 8 oz (78.2 kg)  11/20/23 169 lb 11.2 oz (77 kg)     GENERAL:  Well nourished, well developed in no acute distress NECK: No JVD; No carotid bruits CARDIAC: RRR, S1 and S2 present, no murmurs, no rubs, no gallops CHEST:  Clear to auscultation without rales, wheezing or rhonchi  Extremities: No pitting pedal edema. Pulses bilaterally symmetric with radial 2+ and dorsalis pedis 2+ NEUROLOGIC:  Alert and oriented x 3.  Right-sided facial droop  Medication Adjustments/Labs and Tests Ordered: Current medicines are reviewed at length with the patient today.  Concerns regarding medicines are outlined above.  Orders Placed This Encounter  Procedures   EKG 12-Lead   No orders of the defined types were placed in this encounter.   Signed, Hai jess Liborio, MD, MPH,  [facility]. 01/06/2024 2:39 PM    [facility]

## 2024-01-06 NOTE — Assessment & Plan Note (Signed)
 Symptomatic PACs and PVCs as noted on his Apple smart watch couple years ago symptoms have been well-controlled on acebutolol  200 mg once daily.  Good functional status.

## 2024-01-13 ENCOUNTER — Telehealth: Payer: Self-pay

## 2024-01-13 ENCOUNTER — Other Ambulatory Visit: Payer: Self-pay | Admitting: Oncology

## 2024-01-13 DIAGNOSIS — E0590 Thyrotoxicosis, unspecified without thyrotoxic crisis or storm: Secondary | ICD-10-CM | POA: Diagnosis not present

## 2024-01-13 DIAGNOSIS — C7800 Secondary malignant neoplasm of unspecified lung: Secondary | ICD-10-CM | POA: Diagnosis not present

## 2024-01-13 DIAGNOSIS — C792 Secondary malignant neoplasm of skin: Secondary | ICD-10-CM | POA: Diagnosis not present

## 2024-01-13 DIAGNOSIS — R911 Solitary pulmonary nodule: Secondary | ICD-10-CM | POA: Diagnosis not present

## 2024-01-13 DIAGNOSIS — G588 Other specified mononeuropathies: Secondary | ICD-10-CM

## 2024-01-13 DIAGNOSIS — C439 Malignant melanoma of skin, unspecified: Secondary | ICD-10-CM | POA: Diagnosis not present

## 2024-01-13 DIAGNOSIS — C7931 Secondary malignant neoplasm of brain: Secondary | ICD-10-CM | POA: Diagnosis not present

## 2024-01-13 LAB — TSH: TSH: 1.24 (ref 0.41–5.90)

## 2024-01-13 LAB — HEPATIC FUNCTION PANEL
ALT: 18 U/L (ref 10–40)
AST: 32 (ref 14–40)
Alkaline Phosphatase: 70 (ref 25–125)
Bilirubin, Total: 0.6

## 2024-01-13 LAB — BASIC METABOLIC PANEL WITH GFR
BUN: 21 (ref 4–21)
CO2: 31 — AB (ref 13–22)
Chloride: 104 (ref 99–108)
Creatinine: 0.8 (ref 0.6–1.3)
Glucose: 98
Potassium: 4.5 meq/L (ref 3.5–5.1)
Sodium: 137 (ref 137–147)

## 2024-01-13 LAB — COMPREHENSIVE METABOLIC PANEL WITH GFR
Albumin: 4.3 (ref 3.5–5.0)
Calcium: 9.4 (ref 8.7–10.7)
eGFR: 106

## 2024-01-13 LAB — CBC AND DIFFERENTIAL
HCT: 38 — AB (ref 41–53)
Hemoglobin: 13 — AB (ref 13.5–17.5)
Neutrophils Absolute: 2.94
Platelets: 265 K/uL (ref 150–400)
WBC: 4.9

## 2024-01-13 LAB — LAB REPORT - SCANNED
EGFR: 106
Free T4: 1.09 ng/dL
TSH: 1.24 (ref 0.41–5.90)

## 2024-01-13 LAB — CBC: RBC: 4.01 (ref 3.87–5.11)

## 2024-01-13 MED ORDER — PREGABALIN 100 MG PO CAPS: 100.0000 mg | ORAL_CAPSULE | Freq: Two times a day (BID) | ORAL | 5 refills | Status: DC

## 2024-01-13 NOTE — Progress Notes (Signed)
 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  MRI of the brain confirmed a 2.6 cm heterogeneous lesion of the left superior temporal gyrus with mild enhancement and extensive surrounding edema.  He also has an 8 mm cortical lesion of the inferomedial right frontal lobe with mild edema, a 2 mm mildly enhancing cortical lesion of the inferolateral left frontal lobe and a intrinsically T1 hyperintense lesion of the left lateral pons measuring 7 mm with surrounding edema.  He has 4 lesions in total, but 3 are less than 1 cm.  He was treated with surgical resection of the larger lesion and stereotactic radiation.    He had recurrent neurologic symptoms with left-sided facial droop and left-sided weakness in September 2023 and was placed on dexamethasone  4 mg 3 times daily.  Brain revealed thin linear enhancement along the resection margin of the left temporal resection cavity, which may be postoperative, with additional small areas of somewhat more nodular enhancement, which are nonspecific, but could indicate recurrent disease. Attention on follow-up. Interval increase in the size of the right inferior frontal gyrus enhancing lesion with increased surrounding edema, mild local mass effect, and mild local midline shift. Stable to slightly decreased size of the left pontine lesion with decreased associated edema. Previously noted left inferior frontal gyrus lesion is no longer visualized. No definite new lesion.  [name] slowly weaned him off dexamethasone .  MRI brain in December 2023 was stable.  He has had persistent left facial weakness.   MRI brain in March revealed increased size of a hemorrhagic left pons/middle cerebellar peduncle lesion with increased  surrounding edema, similar versus slightly decreased size of a 13 x 10 mm enhancing lesion in the medial right frontal lobe, and mildly diminished intrinsic T1 hyperintensity of the left temporal resection site with linear enhancement along the lateral margin, probably postsurgical/post radiation.  PET brain in April did not show hyperactivity, but more radiation necrosis, so these changes are more consistent with posttreatment changes.  MRI head in August revealed new punctate superior right frontal lobe enhancement compatible with interval metastasis, interval regression of enhancement and edema in the left brachium pontis and pons. There was also a vasogenic edema at a pre-existing right frontal metastasis with underlying lesion enhancement remaining stable.  [name] recommended continued observation and saw him in December 2024 with a repeat MRI brain.  This looks quite good and the punctate lesion seen on the last scan has resolved.  We see no new lesions and overall the scan looks good, so [name] will repeat it 4 months this time.  This was suspicious on the PET scan in the left frontoparietal lobe.  MRI of the brain confirmed a 2.6 cm heterogeneous lesion of the left superior temporal gyrus with mild enhancement and extensive surrounding edema.  He also has an 8 mm cortical lesion of the inferomedial right frontal lobe with mild edema, a 2 mm mildly enhancing cortical lesion of the inferolateral left frontal lobe and a intrinsically T1 hyperintense lesion of the left lateral pons measuring 7 mm with surrounding edema.  He has 4 lesions in total, but 3 are less than 1 cm.  He was treated with surgical resection of the larger lesion and stereotactic radiation.  He had recurrent neurologic symptoms with left-sided facial droop and left-sided weakness in September 2023 and was placed on dexamethasone  4 mg 3 times daily.  Brain revealed thin linear enhancement along the resection margin of the left  temporal resection cavity, which may be postoperative, with additional small areas of somewhat more nodular enhancement, which are nonspecific, but could indicate recurrent disease. Attention on follow-up. Interval increase in the size of the right inferior frontal gyrus enhancing lesion with increased surrounding edema, mild local mass effect, and mild local midline shift. Stable to slightly decreased size of the left pontine lesion with decreased associated edema. Previously noted left inferior frontal gyrus lesion is no longer visualized. No definite new lesion.  [name] slowly weaned him off dexamethasone .  MRI brain in December 2023 was stable.  He has had persistent left facial weakness.   MRI brain in March of 2024 revealed increased size of a hemorrhagic left pons/middle cerebellar peduncle lesion with increased surrounding edema, similar versus slightly decreased size of a 13 x 10 mm enhancing lesion in the medial right frontal lobe, and mildly diminished intrinsic T1 hyperintensity of the left temporal resection site with linear enhancement along the lateral margin, probably postsurgical/post radiation.  PET brain in April did not show hyperactivity, but more radiation necrosis, so these changes are more consistent with posttreatment changes.  MRI head in August revealed new punctate superior right frontal lobe enhancement compatible with interval metastasis, interval regression of enhancement and edema in the left brachium pontis and pons. There was also a vasogenic edema at a pre-existing right frontal metastasis with underlying lesion enhancement remaining stable.  [name] recommended continued observation.    [name] saw him in December 2024 with MRI brain, which looked quite good and the punctate lesion seen on the last scan had resolved.  MRI brain in April revealed continued stable treated metastases of the left brainstem, right inferior frontal gyrus, left temporal lobe resection site.  Additional punctate enhancement in the central left cerebellum, intermittently visible over this series of exams, and associated with a rounded 8 mm area of FLAIR hyperintensity were stable since August 2024, although larger than in 2023. This is most likely an additional tiny treated metastasis. No new metastatic disease identified. He is scheduled to see [name] again in October with repeat MRI brain.   Malignant melanoma metastatic to lung [facility]) He had bilateral hypermetabolic pulmonary nodules which increased in size by the time treatment was started. The largest nodule is up to 2.3 cm in diameter. CT in January 2024 revealed a good response. Most recent CT chest in July showed 2 tiny 4 mm nodules, which were persistent and unchanged.  No new disease was seen.  He has continued maintenance nivolumab .  He is doing well at this time, so we will proceed with a 24th cycle of nivolumab  today. CT chest, abdomen, and pelvis done on 01/13/2024 which revealed small left lower lobe pulmonary nodule which is stable and negative for progression of pulmonary disease.    Malignant melanoma metastatic to skin [facility]) He presented with a new area of concern of the left mid back in June 2023.  A superficial nodule was noted approximately 1 cm to the right of his upper thoracic spine,and it had increased in size. Biopsy which revealed metastatic malignant melanoma.   He had several hypermetabolic subcutaneous nodules seen on PET, which have resolved with treatment. There are no new skin lesions.   Melanoma of skin (HCC) Stage IIB  malignant melanoma of the right posterior shoulder, Breslow's depth 3.3 mm, Clark level IV, treated with wide local excision, diagnosed in May 2021.  There was still a question as to whether this represented a metastatic lesion, rather than a primary, based on the presence of 2 nodules and no epidermal involvement.  MRI head and CT chest, abdomen and pelvis in June 2021 were negative for  metastatic disease.   No adjuvant therapy was recommended.  PET scan in September was negative for metastatic disease.     In June 2023, he was found to have widespread metastatic disease, including to the brain.  CT scans in January 2024 revealed resolved subcarinal and bilateral hilar lymphadenopathy. Stable to decreased left pulmonary nodules. No new or progressive metastatic disease in the chest, abdomen or pelvis. CT chest, abdomen, and pelvis done on 01/13/2024 which revealed small left lower lobe pulmonary nodule which is stable, negative for progression of pulmonary disease, and negative for hepatic, adrenal, or bony metastatic disease.   Acute pulmonary embolism without acute cor pulmonale (HCC) Non occlusive segmental pulmonary embolism in the inferior left upper lobe diagnosed in September 2023.  Due to his malignancy, he remains on rivaroxaban  20 mg daily.  He denies abnormal bruising or bleeding.   Right foot pain Persistent right foot pain which has been attributed to neuropathy, but increased pain with walking. I increased his Lyrica  from 75 mg to 100 mg TID for his pain management, but he continues to require hydrocodone  as well.    Plan: He had widespread metastatic melanoma found in 2023 and disease is completely controlled with immunotherapy. I increased his Lyrica  from 75 mg to 100 mg TID for his pain management. He had a CT chest, abdomen, and pelvis done on 01/13/2024 which revealed a small left lower lobe pulmonary nodule which is stable, negative for progression of pulmonary disease, and negative for hepatic, adrenal, or bony metastatic disease. He will have a repeat MRI of the brain in October. His day 1 cycle 24 of nivolumab  is scheduled on 01/15/2024. He had labs done on 01/13/2024 and has a WBC of 4.9, hemoglobin of 13.0, and platelet count of 265,000. His CMP was also normal.  I will see him back in 4 weeks with CBC and CMP. The patient understands the plans discussed today and  is in agreement with them.  He knows to contact our office if he develops concerns prior to his next appointment.   I provided 18 minutes of face-to-face time during this encounter and > 50% was spent counseling as documented under my assessment and plan.   Wanda VEAR Cornish, MD  [facility] A DEPT OF [facility] [address] [place] [address] Dept: [phone] Dept Fax: [phone]    No orders of the defined types were placed in this encounter.    CHIEF COMPLAINT:  CC: Metastatic melanoma  Current Treatment: Maintenance nivolumab  every 4 weeks  HISTORY OF PRESENT ILLNESS:   Oncology History  Melanoma of skin (HCC)  12/01/2019 Initial Diagnosis   Melanoma of skin (HCC)   12/05/2019 Cancer Staging   Staging form: Melanoma of the Skin, AJCC 8th Edition - Clinical stage from 12/05/2019: Stage IIA (cT3a, cN0, cM0) - Signed by Cornish Wanda VEAR, MD on 05/30/2020   02/18/2021 Genetic Testing   No pathogenic variants detected in [facility] Multi-Cancer +RNA Panel.  Variants of uncertain significance detected in APC (c.681C>G (p.Asp227Glu)), CDKN1C (c.610C>G (p.Pro204Ala)), and  FH (c.1151C>T (p.Ala384Val)).  The report date is February 18, 2021.    The Multi-Cancer + RNA Panel offered by [facility] includes sequencing and/or deletion/duplication analysis of the following 84 genes:  AIP*, ALK, APC*, ATM*, AXIN2*, BAP1*, BARD1*, BLM*, BMPR1A*, BRCA1*, BRCA2*, BRIP1*, CASR, CDC73*, CDH1*, CDK4, CDKN1B*, CDKN1C*, CDKN2A, CEBPA, CHEK2*, CTNNA1*, DICER1*, DIS3L2*, EGFR, EPCAM, FH*, FLCN*, GATA2*, GPC3, GREM1, HOXB13, HRAS, KIT, MAX*, MEN1*, MET, MITF, MLH1*, MSH2*, MSH3*, MSH6*, MUTYH*, NBN*, NF1*, NF2*, NTHL1*, PALB2*, PDGFRA, PHOX2B, PMS2*, POLD1*, POLE*, POT1*, PRKAR1A*, PTCH1*, PTEN*, RAD50*, RAD51C*, RAD51D*, RB1*, RECQL4, RET, RUNX1*, SDHA*, SDHAF2*, SDHB*, SDHC*, SDHD*, SMAD4*, SMARCA4*, SMARCB1*, SMARCE1*, STK11*, SUFU*, TERC,  TERT, TMEM127*, Tp53*, TSC1*, TSC2*, VHL*, WRN*, and WT1.  RNA analysis is performed for * genes.   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 (HCC)   01/30/2022 - 02/20/2022 Chemotherapy   Patient is on Treatment Plan : MELANOMA Nivolumab  + Ipilimumab  (1/3) q21d / Nivolumab  q28d     01/30/2022 -  Chemotherapy   Patient is on Treatment Plan : MELANOMA Nivolumab  (480) q28d     Malignant melanoma metastatic to lung (HCC)  12/19/2021 Initial Diagnosis   Malignant melanoma metastatic to lung (HCC)   01/30/2022 - 02/20/2022 Chemotherapy   Patient is on Treatment Plan : MELANOMA Nivolumab  + Ipilimumab  (1/3) q21d / Nivolumab  q28d     01/30/2022 -  Chemotherapy   Patient is on Treatment Plan : MELANOMA Nivolumab  (480) q28d     03/24/2022 Imaging   CTA chest:  IMPRESSION:  1. Small nonocclusive segmental level pulmonary embolism in the  inferior LEFT upper lobe.  2. Marked bronchial wall thickening with areas of patchy basilar  opacities and new ground-glass opacities in the upper lobes.  Constellation of findings may be infectious bronchiolitis/bronchitis  though the possibility of drug related changes in the setting of  immunotherapy are also considered. Furthermore, given basilar  predominance and some material in basilar bronchial structures would  correlate with any risk factors for or history of aspiration.  3. Response to therapy of bilateral metastatic lesions. Some lymph  nodes in the chest with interval increase in size are equivocal at  this time, potentially reactive, but warrant attention on subsequent  imaging.    07/22/2022 Imaging   CT chest, abdomen and pelvis:  IMPRESSION:   1. Interval positive response to therapy. Resolved subcarinal and  bilateral hilar lymphadenopathy. Stable to decreased left pulmonary  nodules. No new or progressive metastatic disease in the chest,  abdomen or pelvis.  2. One vessel coronary atherosclerosis.  3. Aortic Atherosclerosis (ICD10-I70.0).    Malignant neoplasm metastatic to liver (HCC)  12/19/2021 Initial Diagnosis   Malignant neoplasm metastatic to liver (HCC)   01/30/2022 - 02/20/2022 Chemotherapy   Patient is on Treatment Plan : MELANOMA Nivolumab  + Ipilimumab  (1/3) q21d / Nivolumab  q28d     01/30/2022 -  Chemotherapy   Patient is on Treatment Plan : MELANOMA Nivolumab  (480) q28d     07/22/2022 Imaging   CT chest, abdomen and pelvis:  IMPRESSION:  1. Interval positive response to therapy. Resolved subcarinal and  bilateral hilar lymphadenopathy. Stable to decreased left pulmonary  nodules. No new or progressive metastatic disease in  the chest,  abdomen or pelvis.  2. One vessel coronary atherosclerosis.  3. Aortic Atherosclerosis (ICD10-I70.0).      INTERVAL HISTORY:  Xavier Jones is here today for repeat clinical assessment for his metastatic melanoma. He has had chronic lightheadedness, hearing loss, and tinnitus that has been going on since radiation to the head, which worsens with loud noise, and he works in a loud environment. He has seen a ENT about this to no avail. This is felt to be due to radiation. Patient states that he feels well but complains of work stress, fatigue, and foot pain. I increased his Lyrica  from 75 mg to 100 mg TID for his pain management. He had a CT chest, abdomen, and pelvis done on 01/13/2024 which revealed small left lower lobe pulmonary nodule which is stable, negative for progression of pulmonary disease, and negative for hepatic, adrenal, or bony metastatic disease. He will have a repeat MRI of the brain in October. His day 1 cycle 24 is scheduled on 01/15/2024. He had labs done on 01/13/2024  and has a WBC of 4.9, hemoglobin of 13.0, and platelet count of 265,000. His CMP was also normal.  I will see him back in 4 weeks with CBC and CMP. He denies fever, chills, night sweats, or other signs of infection. He denies cardiorespiratory and gastrointestinal issues. His appetite is good and His weight has decreased 3 pounds over last 4 weeks.   REVIEW OF SYSTEMS:   Review of Systems  Constitutional:  Positive for fatigue. Negative for appetite change, chills, fever and unexpected weight change.  HENT:   Positive for tinnitus. Negative for lump/mass, mouth sores and sore throat.   Eyes:  Positive for eye problems (Left eye dryness, intermittent blurry vision).  Respiratory: Negative.  Negative for chest tightness, cough, hemoptysis, shortness of breath and wheezing.   Cardiovascular: Negative.  Negative for chest pain, leg swelling and palpitations.  Gastrointestinal:  Positive for diarrhea. Negative for abdominal distention, abdominal pain, blood in stool, constipation, nausea and vomiting.  Endocrine: Negative.  Negative for hot flashes.  Genitourinary: Negative.  Negative for difficulty urinating, dysuria, frequency and hematuria.   Musculoskeletal:  Negative for arthralgias, back pain, flank pain, gait problem and myalgias.       Foot pain  Skin:  Negative for itching, rash and wound.  Neurological:  Positive for dizziness (Intermittent), headaches (Intermittent) and numbness (Numbness and tingling right leg). Negative for extremity weakness, gait problem, light-headedness, seizures and speech difficulty.  Hematological: Negative.  Negative for adenopathy. Does not bruise/bleed easily.  Psychiatric/Behavioral: Negative.  Negative for depression and sleep disturbance. The patient is not nervous/anxious.        Work stress     VITALS:   Blood pressure 108/76, pulse 73, temperature 98.6 F (37 C), temperature source Oral, resp. rate 20, height 5' 9.5 (1.765 m), weight 169 lb 11.2 oz  (77 kg), SpO2 98%.  Wt Readings from Last 3 Encounters:  01/15/24 169 lb 11.2 oz (77 kg)  01/06/24 170 lb (77.1 kg)  12/18/23 172 lb 8 oz (78.2 kg)    Body mass index is 24.7 kg/m.  Performance status (ECOG): 1 - Symptomatic but completely ambulatory  PHYSICAL EXAM:   Physical Exam Vitals and nursing note reviewed.  Constitutional:      General: He is not in acute distress.    Appearance: Normal appearance. He is normal weight. He is not ill-appearing, toxic-appearing or diaphoretic.  HENT:     Head: Normocephalic and atraumatic.  Right Ear: Tympanic membrane, ear canal and external ear normal. There is no impacted cerumen.     Left Ear: Tympanic membrane, ear canal and external ear normal. There is no impacted cerumen.     Nose: Nose normal. No congestion or rhinorrhea.     Mouth/Throat:     Mouth: Mucous membranes are moist.     Pharynx: Oropharynx is clear. No oropharyngeal exudate or posterior oropharyngeal erythema.  Eyes:     General: No scleral icterus.    Extraocular Movements: Extraocular movements intact.     Conjunctiva/sclera: Conjunctivae normal.     Pupils: Pupils are equal, round, and reactive to light.  Cardiovascular:     Rate and Rhythm: Normal rate and regular rhythm.     Pulses: Normal pulses.     Heart sounds: Normal heart sounds. No murmur heard.    No friction rub. No gallop.  Pulmonary:     Effort: Pulmonary effort is normal.     Breath sounds: Normal breath sounds. No wheezing, rhonchi or rales.  Abdominal:     General: Bowel sounds are normal. There is no distension.     Palpations: Abdomen is soft. There is no hepatomegaly, splenomegaly or mass.     Tenderness: There is no abdominal tenderness.  Musculoskeletal:        General: Normal range of motion.     Cervical back: Normal range of motion and neck supple. No tenderness.     Right lower leg: No edema.     Left lower leg: No edema.  Lymphadenopathy:     Cervical: No cervical adenopathy.      Upper Body:     Right upper body: No supraclavicular or axillary adenopathy.     Left upper body: No supraclavicular or axillary adenopathy.     Lower Body: No right inguinal adenopathy. No left inguinal adenopathy.  Skin:    General: Skin is warm and dry.     Coloration: Skin is not jaundiced.     Findings: No lesion or rash.  Neurological:     General: No focal deficit present.     Mental Status: He is alert and oriented to person, place, and time. Mental status is at baseline.     Cranial Nerves: Cranial nerve deficit (Left facial weakness) and facial asymmetry (Drooping left eye and mouth) present.     Sensory: Sensory deficit (Left facial numbness) present.     Motor: No weakness.     Coordination: Coordination normal.     Gait: Gait normal.     Deep Tendon Reflexes: Reflexes normal.  Psychiatric:        Mood and Affect: Mood normal.        Behavior: Behavior normal.        Thought Content: Thought content normal.        Judgment: Judgment normal.     LABS:      Latest Ref Rng & Units 12/18/2023    9:09 AM 11/20/2023    8:33 AM 10/22/2023    8:30 AM  CBC  WBC 4.0 - 10.5 K/uL 4.2  4.9  4.6   Hemoglobin 13.0 - 17.0 g/dL 87.2  87.7  88.0   Hematocrit 39.0 - 52.0 % 37.8  36.6  34.7   Platelets 150 - 400 K/uL 247  256  251       Latest Ref Rng & Units 12/18/2023    9:09 AM 11/20/2023    8:33 AM 10/22/2023    8:30 AM  CMP  Glucose 70 - 99 mg/dL 849  879  94   BUN 6 - 20 mg/dL 21  21  20    Creatinine 0.61 - 1.24 mg/dL 9.05  9.17  9.24   Sodium 135 - 145 mmol/L 141  141  142   Potassium 3.5 - 5.1 mmol/L 4.2  4.5  4.1   Chloride 98 - 111 mmol/L 105  104  105   CO2 22 - 32 mmol/L 26  26  29    Calcium 8.9 - 10.3 mg/dL 9.6  9.7  9.3   Total Protein 6.5 - 8.1 g/dL 6.5  6.6  6.3   Total Bilirubin 0.0 - 1.2 mg/dL 0.4  0.5  0.4   Alkaline Phos 38 - 126 U/L 84  71  80   AST 15 - 41 U/L 19  19  15    ALT 0 - 44 U/L 11  13  7     Lab Results  Component Value Date   TSH 1.24  01/13/2024   T4TOTAL 6.9 12/18/2023   Lab Results  Component Value Date   TOTALPROTELP 6.7 08/27/2023   TOTALPROTELP 6.7 08/27/2023   ALBUMINELP 3.9 08/27/2023   A1GS 0.2 08/27/2023   A2GS 0.7 08/27/2023   BETS 1.0 08/27/2023   GAMS 0.8 08/27/2023   MSPIKE Not Observed 08/27/2023   SPEI Comment 08/27/2023   Lab Results  Component Value Date   TIBC 382 06/02/2023   TIBC 297 05/21/2022   FERRITIN 22 (L) 06/02/2023   FERRITIN 41 05/21/2022   IRONPCTSAT 23 06/02/2023   IRONPCTSAT 18 05/21/2022   Lab Results  Component Value Date   LDH 139 05/21/2022   LDH 145 12/19/2021    STUDIES:  Exam: 01/13/2024 CT Chest, Abdomen, and Pelvis Impression: Small left lower lobe pulmonary nodule, stable.  Negative for progression of pulmonary disease. Negative for hepatic, adrenal, or bony metastatic disease.   EXAM: 10/23/2023 MRI HEAD WITHOUT AND WITH CONTRAST  IMPRESSION: 1. Continued stable treated metastases of the left brainstem, right inferior frontal gyrus, left temporal lobe resection site. 2. Note additional punctate enhancement in the central left cerebellum, intermittently visible over this series of exams, and associated with a rounded 8 mm area of FLAIR hyperintensity there stable since August 2024, although larger than in 2023. This is most likely an additional tiny treated metastasis. 3. No new metastatic disease identified.    HISTORY:   Past Medical History:  Diagnosis Date   Acute pulmonary embolism without acute cor pulmonale (HCC) 05/01/2022   Anemia 05/20/2022   Chemotherapy follow-up examination 04/21/2023   Degenerative disc disease, cervical 08/27/2023   Depression    mild   Family history of breast cancer 01/22/2021   Family history of prostate cancer 01/22/2021   Genetic testing 02/22/2021   No pathogenic variants detected in [facility] Multi-Cancer +RNA Panel.  Variants of uncertain significance detected in APC (c.681C>G (p.Asp227Glu)), CDKN1C (c.610C>G  (p.Pro204Ala)), and FH (c.1151C>T (p.Ala384Val)).  The report date is February 18, 2021.       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) 12/19/2021   Melanoma of skin (HCC) 12/01/2019   Multiple dysplastic nevi 05/30/2020   Peripheral neuropathy 05/09/2023   Port-A-Cath in place 07/28/2022   Pruritic rash 02/18/2022   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   Right shoulder pain 02/18/2022   Skin cancer    melanoma    Past Surgical History:  Procedure Laterality Date   APPLICATION OF CRANIAL NAVIGATION N/A 01/03/2022   Procedure: APPLICATION OF CRANIAL NAVIGATION;  Surgeon: Lanis Pupa, MD;  Location: [facility] OR;  Service: Neurosurgery;  Laterality: N/A;   CRANIOTOMY Left 01/03/2022   Procedure: Stereotactic Left temporal craniotomy for resection of tumor;  Surgeon: Lanis Pupa, MD;  Location: [facility] OR;  Service: Neurosurgery;  Laterality: Left;   MELANOMA EXCISION WITH SENTINEL LYMPH NODE BIOPSY Right 2021   SEPTOPLASTY  05/13/2023   Dr Honor   SPINAL FUSION W/ LUQUE UNIT ROD  2003    Family History  Problem Relation Age of Onset   Prostate cancer Father        dx 43s   Breast cancer Maternal Aunt        dx after 93; bilateral mastectomy   Stomach cancer Maternal Grandfather        dx 11s   Breast cancer Cousin 29       maternal male cousin; mets    Social History:  reports that he quit smoking about 27 years ago. His smoking  use included cigarettes. He has never used smokeless tobacco. He reports current alcohol use of about 14.0 standard drinks of alcohol per week. He reports that he does not currently use drugs.The patient is alone today.  Allergies:  Allergies  Allergen Reactions   Sulfa Antibiotics Rash   Testosterone  Itching    Gel    Current Medications: Current Outpatient Medications  Medication Sig Dispense Refill   acebutolol  (SECTRAL ) 200 MG capsule TAKE ONE CAPSULE BY MOUTH EVERY DAY 90 capsule 1   acyclovir  (ZOVIRAX ) 400 MG tablet Take 400 mg by mouth 3 (three) times daily.     amitriptyline  (ELAVIL ) 25 MG tablet Take 50 mg by mouth at bedtime.     ergocalciferol (VITAMIN D2) 1.25 MG (50000 UT) capsule Take 50,000 Units by mouth every Wednesday.     folic acid  (FOLVITE ) 1 MG tablet TAKE ONE TABLET BY MOUTH DAILY 30 tablet 5   HYDROcodone -acetaminophen  (NORCO/VICODIN) 5-325 MG tablet Take 1 tablet by mouth every 4 (four) hours as needed for moderate pain (pain score 4-6). 150 tablet 0   hydrOXYzine  (ATARAX ) 25 MG tablet TAKE ONE TABLET BY MOUTH 3 TIMES A DAY AS NEEDED FOR ITCHING 60 tablet 3   lidocaine -prilocaine  (EMLA ) cream Apply 1 Application topically as needed. 30 g 0   lisinopril  (ZESTRIL ) 20 MG tablet TAKE ONE TABLET BY MOUTH DAILY 30 tablet 1   pregabalin  (LYRICA ) 100 MG capsule Take 1 capsule (100 mg total) by mouth 3 (three) times daily. 90 capsule 5   XARELTO  20 MG TABS tablet TAKE ONE TABLET BY MOUTH EVERY DAY WITH SUPPER. START AFTER 3 WEEKS OF 15MG 2 TIMES A DAY 30 tablet 3   No current facility-administered medications for this visit.    I,Jasmine M Lassiter,acting as a scribe for Wanda VEAR Cornish, MD.,have documented all relevant documentation on the behalf of Wanda VEAR Cornish, MD,as directed by  Wanda VEAR Cornish, MD while in the presence of Wanda VEAR Cornish, MD.

## 2024-01-13 NOTE — Telephone Encounter (Signed)
 Pt requesting increase in Lyrica  dose.

## 2024-01-14 ENCOUNTER — Encounter: Payer: Self-pay | Admitting: Oncology

## 2024-01-15 ENCOUNTER — Inpatient Hospital Stay (HOSPITAL_BASED_OUTPATIENT_CLINIC_OR_DEPARTMENT_OTHER): Admitting: Oncology

## 2024-01-15 ENCOUNTER — Encounter: Payer: Self-pay | Admitting: Oncology

## 2024-01-15 ENCOUNTER — Other Ambulatory Visit

## 2024-01-15 ENCOUNTER — Telehealth: Payer: Self-pay | Admitting: Oncology

## 2024-01-15 ENCOUNTER — Other Ambulatory Visit: Payer: Self-pay | Admitting: Pharmacist

## 2024-01-15 ENCOUNTER — Inpatient Hospital Stay: Attending: Hematology and Oncology

## 2024-01-15 VITALS — BP 108/76 | HR 73 | Temp 98.6°F | Resp 20 | Ht 69.5 in | Wt 169.7 lb

## 2024-01-15 DIAGNOSIS — C439 Malignant melanoma of skin, unspecified: Secondary | ICD-10-CM

## 2024-01-15 DIAGNOSIS — C7931 Secondary malignant neoplasm of brain: Secondary | ICD-10-CM | POA: Diagnosis not present

## 2024-01-15 DIAGNOSIS — C787 Secondary malignant neoplasm of liver and intrahepatic bile duct: Secondary | ICD-10-CM

## 2024-01-15 DIAGNOSIS — Z5112 Encounter for antineoplastic immunotherapy: Secondary | ICD-10-CM | POA: Insufficient documentation

## 2024-01-15 DIAGNOSIS — C4361 Malignant melanoma of right upper limb, including shoulder: Secondary | ICD-10-CM | POA: Insufficient documentation

## 2024-01-15 DIAGNOSIS — C792 Secondary malignant neoplasm of skin: Secondary | ICD-10-CM | POA: Insufficient documentation

## 2024-01-15 DIAGNOSIS — C7801 Secondary malignant neoplasm of right lung: Secondary | ICD-10-CM | POA: Insufficient documentation

## 2024-01-15 DIAGNOSIS — C7800 Secondary malignant neoplasm of unspecified lung: Secondary | ICD-10-CM

## 2024-01-15 MED ORDER — SODIUM CHLORIDE 0.9% FLUSH
10.0000 mL | INTRAVENOUS | Status: DC | PRN
  Administered 2024-01-15: 10 mL

## 2024-01-15 MED ORDER — HEPARIN SOD (PORK) LOCK FLUSH 100 UNIT/ML IV SOLN
500.0000 [IU] | Freq: Once | INTRAVENOUS | Status: AC | PRN
Start: 2024-01-15 — End: 2024-01-15
  Administered 2024-01-15: 500 [IU]

## 2024-01-15 MED ORDER — SODIUM CHLORIDE 0.9 % IV SOLN
Freq: Once | INTRAVENOUS | Status: AC
Start: 2024-01-15 — End: 2024-01-15

## 2024-01-15 MED ORDER — SODIUM CHLORIDE 0.9 % IV SOLN
480.0000 mg | Freq: Once | INTRAVENOUS | Status: AC
  Administered 2024-01-15: 480 mg via INTRAVENOUS
  Filled 2024-01-15: qty 48

## 2024-01-15 NOTE — Progress Notes (Signed)
 LABS at [facility] on 01/13/24  HGB=13 PLT=265 ANC=2.94  SCR=0.8 AST=32 ALT=18 BILI=0.6  TSH=1.24 T4=1.09

## 2024-01-15 NOTE — Telephone Encounter (Signed)
 Patient has been scheduled for follow-up visit per 01/15/24 LOS.  Pt given an appt calendar with date and time.

## 2024-01-18 ENCOUNTER — Other Ambulatory Visit: Payer: Self-pay | Admitting: Oncology

## 2024-01-18 ENCOUNTER — Other Ambulatory Visit: Payer: Self-pay | Admitting: Hematology and Oncology

## 2024-01-18 DIAGNOSIS — G588 Other specified mononeuropathies: Secondary | ICD-10-CM

## 2024-01-18 DIAGNOSIS — D529 Folate deficiency anemia, unspecified: Secondary | ICD-10-CM

## 2024-01-18 MED ORDER — PREGABALIN 100 MG PO CAPS: 100.0000 mg | ORAL_CAPSULE | Freq: Three times a day (TID) | ORAL | 5 refills | Status: DC

## 2024-01-18 NOTE — Telephone Encounter (Signed)
 Pt called to get his Lyrica  prescription straightened out. Dr Cornelius increased it last week from 75mg  TID to 100mg  TID. However, she sent in 100mg  BID, which is less than what I was taking (215mg ).

## 2024-01-19 ENCOUNTER — Encounter: Payer: Self-pay | Admitting: Hematology and Oncology

## 2024-01-19 ENCOUNTER — Telehealth: Payer: Self-pay

## 2024-01-19 ENCOUNTER — Encounter: Payer: Self-pay | Admitting: Oncology

## 2024-01-19 NOTE — Telephone Encounter (Signed)
 Pt called to make us  aware that the Lyrica  dosage was to be Lyrica  100mg  TID, not Lyrica  100mg  BID. Lyrica  100mg  po BID would actually be less than what he was currently taken. Message sent to [facility], NP.

## 2024-01-24 DIAGNOSIS — Z888 Allergy status to other drugs, medicaments and biological substances status: Secondary | ICD-10-CM | POA: Diagnosis not present

## 2024-01-24 DIAGNOSIS — Z87891 Personal history of nicotine dependence: Secondary | ICD-10-CM | POA: Diagnosis not present

## 2024-01-24 DIAGNOSIS — I878 Other specified disorders of veins: Secondary | ICD-10-CM | POA: Diagnosis not present

## 2024-01-24 DIAGNOSIS — S7001XA Contusion of right hip, initial encounter: Secondary | ICD-10-CM | POA: Diagnosis not present

## 2024-01-24 DIAGNOSIS — Z85820 Personal history of malignant melanoma of skin: Secondary | ICD-10-CM | POA: Diagnosis not present

## 2024-01-24 DIAGNOSIS — S0990XA Unspecified injury of head, initial encounter: Secondary | ICD-10-CM | POA: Diagnosis not present

## 2024-01-24 DIAGNOSIS — Z882 Allergy status to sulfonamides status: Secondary | ICD-10-CM | POA: Diagnosis not present

## 2024-01-27 ENCOUNTER — Encounter: Payer: Self-pay | Admitting: Oncology

## 2024-01-28 ENCOUNTER — Other Ambulatory Visit (HOSPITAL_BASED_OUTPATIENT_CLINIC_OR_DEPARTMENT_OTHER): Admitting: Radiology

## 2024-01-28 DIAGNOSIS — S7000XA Contusion of unspecified hip, initial encounter: Secondary | ICD-10-CM | POA: Diagnosis not present

## 2024-01-28 DIAGNOSIS — Z79899 Other long term (current) drug therapy: Secondary | ICD-10-CM | POA: Diagnosis not present

## 2024-01-28 DIAGNOSIS — S79911A Unspecified injury of right hip, initial encounter: Secondary | ICD-10-CM | POA: Diagnosis not present

## 2024-01-31 ENCOUNTER — Encounter: Payer: Self-pay | Admitting: Oncology

## 2024-02-01 DIAGNOSIS — Z6826 Body mass index (BMI) 26.0-26.9, adult: Secondary | ICD-10-CM | POA: Diagnosis not present

## 2024-02-01 DIAGNOSIS — S7000XA Contusion of unspecified hip, initial encounter: Secondary | ICD-10-CM | POA: Diagnosis not present

## 2024-02-01 DIAGNOSIS — C439 Malignant melanoma of skin, unspecified: Secondary | ICD-10-CM | POA: Diagnosis not present

## 2024-02-01 DIAGNOSIS — E559 Vitamin D deficiency, unspecified: Secondary | ICD-10-CM | POA: Diagnosis not present

## 2024-02-01 DIAGNOSIS — S20469A Insect bite (nonvenomous) of unspecified back wall of thorax, initial encounter: Secondary | ICD-10-CM | POA: Diagnosis not present

## 2024-02-01 DIAGNOSIS — G4700 Insomnia, unspecified: Secondary | ICD-10-CM | POA: Diagnosis not present

## 2024-02-01 DIAGNOSIS — E785 Hyperlipidemia, unspecified: Secondary | ICD-10-CM | POA: Diagnosis not present

## 2024-02-01 DIAGNOSIS — A6002 Herpesviral infection of other male genital organs: Secondary | ICD-10-CM | POA: Diagnosis not present

## 2024-02-01 LAB — LAB REPORT - SCANNED: EGFR: 104

## 2024-02-03 ENCOUNTER — Encounter: Payer: Self-pay | Admitting: Hematology and Oncology

## 2024-02-04 ENCOUNTER — Other Ambulatory Visit: Payer: Self-pay | Admitting: Hematology and Oncology

## 2024-02-04 DIAGNOSIS — M79604 Pain in right leg: Secondary | ICD-10-CM

## 2024-02-04 DIAGNOSIS — M5126 Other intervertebral disc displacement, lumbar region: Secondary | ICD-10-CM

## 2024-02-04 DIAGNOSIS — G629 Polyneuropathy, unspecified: Secondary | ICD-10-CM

## 2024-02-04 DIAGNOSIS — T148XXA Other injury of unspecified body region, initial encounter: Secondary | ICD-10-CM | POA: Diagnosis not present

## 2024-02-11 ENCOUNTER — Encounter: Payer: Self-pay | Admitting: Oncology

## 2024-02-11 ENCOUNTER — Other Ambulatory Visit: Payer: Self-pay

## 2024-02-11 ENCOUNTER — Inpatient Hospital Stay

## 2024-02-11 ENCOUNTER — Inpatient Hospital Stay: Attending: Hematology and Oncology

## 2024-02-11 ENCOUNTER — Inpatient Hospital Stay (HOSPITAL_BASED_OUTPATIENT_CLINIC_OR_DEPARTMENT_OTHER): Admitting: Hematology and Oncology

## 2024-02-11 VITALS — BP 125/87 | HR 62 | Temp 98.6°F | Resp 14 | Ht 69.5 in | Wt 176.0 lb

## 2024-02-11 DIAGNOSIS — C7800 Secondary malignant neoplasm of unspecified lung: Secondary | ICD-10-CM

## 2024-02-11 DIAGNOSIS — C7802 Secondary malignant neoplasm of left lung: Secondary | ICD-10-CM | POA: Diagnosis not present

## 2024-02-11 DIAGNOSIS — C7931 Secondary malignant neoplasm of brain: Secondary | ICD-10-CM

## 2024-02-11 DIAGNOSIS — C787 Secondary malignant neoplasm of liver and intrahepatic bile duct: Secondary | ICD-10-CM | POA: Insufficient documentation

## 2024-02-11 DIAGNOSIS — C439 Malignant melanoma of skin, unspecified: Secondary | ICD-10-CM

## 2024-02-11 DIAGNOSIS — Z79620 Long term (current) use of immunosuppressive biologic: Secondary | ICD-10-CM | POA: Insufficient documentation

## 2024-02-11 DIAGNOSIS — Z5112 Encounter for antineoplastic immunotherapy: Secondary | ICD-10-CM | POA: Diagnosis not present

## 2024-02-11 DIAGNOSIS — Z87891 Personal history of nicotine dependence: Secondary | ICD-10-CM | POA: Insufficient documentation

## 2024-02-11 DIAGNOSIS — C4361 Malignant melanoma of right upper limb, including shoulder: Secondary | ICD-10-CM | POA: Diagnosis not present

## 2024-02-11 LAB — CMP (CANCER CENTER ONLY)
ALT: 18 U/L (ref 0–44)
AST: 25 U/L (ref 15–41)
Albumin: 4.3 g/dL (ref 3.5–5.0)
Alkaline Phosphatase: 73 U/L (ref 38–126)
Anion gap: 9 (ref 5–15)
BUN: 19 mg/dL (ref 6–20)
CO2: 26 mmol/L (ref 22–32)
Calcium: 9.4 mg/dL (ref 8.9–10.3)
Chloride: 107 mmol/L (ref 98–111)
Creatinine: 0.84 mg/dL (ref 0.61–1.24)
GFR, Estimated: >60 mL/min (ref >60)
Glucose, Bld: 101 mg/dL — ABNORMAL HIGH (ref 70–99)
Potassium: 4.5 mmol/L (ref 3.5–5.1)
Sodium: 141 mmol/L (ref 135–145)
Total Bilirubin: 0.5 mg/dL (ref 0.0–1.2)
Total Protein: 6.7 g/dL (ref 6.5–8.1)

## 2024-02-11 LAB — CBC WITH DIFFERENTIAL (CANCER CENTER ONLY)
Abs Immature Granulocytes: 0.01 K/uL (ref 0.00–0.07)
Basophils Absolute: 0.1 K/uL (ref 0.0–0.1)
Basophils Relative: 1 %
Eosinophils Absolute: 0.1 K/uL (ref 0.0–0.5)
Eosinophils Relative: 3 %
HCT: 34.5 % — ABNORMAL LOW (ref 39.0–52.0)
Hemoglobin: 11.6 g/dL — ABNORMAL LOW (ref 13.0–17.0)
Immature Granulocytes: 0 %
Lymphocytes Relative: 31 %
Lymphs Abs: 1.3 K/uL (ref 0.7–4.0)
MCH: 32.5 pg (ref 26.0–34.0)
MCHC: 33.6 g/dL (ref 30.0–36.0)
MCV: 96.6 fL (ref 80.0–100.0)
Monocytes Absolute: 0.4 K/uL (ref 0.1–1.0)
Monocytes Relative: 10 %
Neutro Abs: 2.3 K/uL (ref 1.7–7.7)
Neutrophils Relative %: 55 %
Platelet Count: 278 K/uL (ref 150–400)
RBC: 3.57 MIL/uL — ABNORMAL LOW (ref 4.22–5.81)
RDW: 13.2 % (ref 11.5–15.5)
WBC Count: 4.2 K/uL (ref 4.0–10.5)
nRBC: 0 % (ref 0.0–0.2)

## 2024-02-11 LAB — TSH: TSH: 1.581 u[IU]/mL (ref 0.350–4.500)

## 2024-02-11 MED ORDER — SODIUM CHLORIDE 0.9 % IV SOLN
480.0000 mg | Freq: Once | INTRAVENOUS | Status: AC
  Administered 2024-02-11: 480 mg via INTRAVENOUS
  Filled 2024-02-11: qty 48

## 2024-02-11 MED ORDER — SODIUM CHLORIDE 0.9 % IV SOLN: Freq: Once | INTRAVENOUS | Status: AC

## 2024-02-11 NOTE — Progress Notes (Signed)
 [facility]  [address] [place],  [place]  [address] [phone]  Clinic Day:  02/11/2024  Referring physician: [name], NP  ASSESSMENT & PLAN:   Assessment & Plan: [name], Breslow's depth 3.3 mm, Clark level IV, treated with wide local excision, diagnosed in May 2021.  There was still a question as to whether this represented a metastatic lesion, rather than a primary, based on the presence of 2 nodules and no epidermal involvement.  MRI head and CT chest, abdomen and pelvis in June 2021 were negative for metastatic disease.   No adjuvant therapy was recommended.  PET scan in September was negative for metastatic disease.    In June 2023, he was found to have widespread metastatic disease, including to the brain, for which he continues nivolumab  every 4 weeks.  [name]) Metastatic melanoma to lung diagnosed in found on routine CT imaging in May 2023.  There was over 20 bilateral pulmonary nodules which increased in size by the time treatment was started. The largest 1 nodule is up to 2.3 cm in diameter.  There were also 4 subcutaneous nodules of the back.  Soft tissue needle core biopsy of a right back subcutaneous nodule revealed metastatic malignant melanoma.  He was initially treated with 4 cycles of ipilimumab /nivolumab  with a good response. He has continued maintenance nivolumab .    Most recent CT imaging in July 2025 revealed a stable subpleural nodule of the left lower lobe, otherwise no metastatic disease was seen.  He is doing well at this time, so we will proceed with nivolumab  today.  We will plan to see him back in 4 weeks with a CBC, comprehensive metabolic panel and TSH prior to his next cycle of nivolumab .  [name]) Brain metastasis diagnosed in June 2023.  PET scan was concerning for possible lesion in  the left frontoparietal lobe.  MRI of the brain confirmed a 2.6 cm heterogeneous lesion of the left superior temporal gyrus with mild enhancement and extensive surrounding edema.  He also has an 8 mm cortical lesion of the inferomedial right frontal lobe with mild edema, a 2 mm mildly enhancing cortical lesion of the inferolateral left frontal lobe and a intrinsically T1 hyperintense lesion of the left lateral pons measuring 7 mm with surrounding edema.  He has 4 lesions in total, but 3 are less than 1 cm.  He was treated with surgical resection of the larger lesion and stereotactic radiation.    He had recurrent neurologic symptoms with left-sided facial droop and left-sided weakness in September 2023 and was placed on dexamethasone  4 mg 3 times daily.  Brain revealed thin linear enhancement along the resection margin of the left temporal resection cavity, which may be postoperative, with additional small areas of somewhat more nodular enhancement, which are nonspecific, but could indicate recurrent disease. Attention on follow-up. Interval increase in the size of the right inferior frontal gyrus enhancing lesion with increased surrounding edema, mild local mass effect, and mild local midline shift. Stable to slightly decreased size of the left pontine lesion with decreased associated edema. Previously noted left inferior frontal gyrus lesion is no longer visualized. No definite new lesion.  [name] slowly weaned him off dexamethasone .  MRI brain in December 2023 was stable.  He has had persistent left facial weakness.   MRI brain in March revealed increased size  of a hemorrhagic left pons/middle cerebellar peduncle lesion with increased surrounding edema, similar versus slightly decreased size of a 13 x 10 mm enhancing lesion in the medial right frontal lobe, and mildly diminished intrinsic T1 hyperintensity of the left temporal resection site with linear enhancement along the lateral margin, probably  postsurgical/post radiation.  PET brain in April did not show hyperactivity, but more radiation necrosis, so these changes are more consistent with posttreatment changes.  MRI head in August revealed new punctate superior right frontal lobe enhancement compatible with interval metastasis, interval regression of enhancement and edema in the left brachium pontis and pons. There was also a vasogenic edema at a pre-existing right frontal metastasis with underlying lesion enhancement remaining stable.  [name] recommended continued observation and saw him in December 2024 with a repeat MRI brain.  This looks quite good and the punctate lesion seen on the last scan has resolved.  We see no new lesions and overall the scan looks good, so [name] will repeat it 4 months this time.  This was suspicious on the PET scan in the left frontoparietal lobe.  MRI of the brain confirmed a 2.6 cm heterogeneous lesion of the left superior temporal gyrus with mild enhancement and extensive surrounding edema.  He also has an 8 mm cortical lesion of the inferomedial right frontal lobe with mild edema, a 2 mm mildly enhancing cortical lesion of the inferolateral left frontal lobe and a intrinsically T1 hyperintense lesion of the left lateral pons measuring 7 mm with surrounding edema.  He has 4 lesions in total, but 3 are less than 1 cm.  He was treated with surgical resection of the larger lesion and stereotactic radiation.    He had recurrent neurologic symptoms with left-sided facial droop and left-sided weakness in September 2023 and was placed on dexamethasone  4 mg 3 times daily.  Brain revealed thin linear enhancement along the resection margin of the left temporal resection cavity, which may be postoperative, with additional small areas of somewhat more nodular enhancement, which are nonspecific, but could indicate recurrent disease. Attention on follow-up. Interval increase in the size of the right inferior frontal gyrus  enhancing lesion with increased surrounding edema, mild local mass effect, and mild local midline shift. Stable to slightly decreased size of the left pontine lesion with decreased associated edema. Previously noted left inferior frontal gyrus lesion is no longer visualized. No definite new lesion.  [name] slowly weaned him off dexamethasone .  MRI brain in December 2023 was stable.  He has had persistent left facial weakness.   MRI brain in March revealed increased size of a hemorrhagic left pons/middle cerebellar peduncle lesion with increased surrounding edema, similar versus slightly decreased size of a 13 x 10 mm enhancing lesion in the medial right frontal lobe, and mildly diminished intrinsic T1 hyperintensity of the left temporal resection site with linear enhancement along the lateral margin, probably postsurgical/post radiation.  PET brain in April did not show hyperactivity, but more radiation necrosis, so these changes are more consistent with posttreatment changes.  MRI head in August revealed new punctate superior right frontal lobe enhancement compatible with interval metastasis, interval regression of enhancement and edema in the left brachium pontis and pons. There was also a vasogenic edema at a pre-existing right frontal metastasis with underlying lesion enhancement remaining stable.  [name] recommended continued observation.  He has had chronic lightheadedness, hearing loss, and tinnitus that has been going on since radiation to the head, which worsens  with loud noise, and he works in a loud environment. He has seen a ENT about this to no avail. This is felt to be due to radiation.  [name] saw him in December 2024 with MRI brain, which looked quite good and the punctate lesion seen on the last scan had resolved.  MRI brain in April revealed continued stable treated metastases of the left brainstem, right inferior frontal gyrus, left temporal lobe resection site. Additional  punctate enhancement in the central left cerebellum, intermittently visible over this series of exams, and associated with a rounded 8 mm area of FLAIR hyperintensity were stable since August 2024, although larger than in 2023. This is most likely an additional tiny treated metastasis. No new metastatic disease identified.  He is scheduled to see [name] again in October with repeat MRI brain.    The patient understands the plans discussed today and is in agreement with them.  He knows to contact our office if he develops concerns prior to his next appointment.   I provided 20 minutes of face-to-face time during this encounter and > 50% was spent counseling as documented under my assessment and plan.    Rozalynn Buege A Adeel Guiffre, PA-C  [facility] - A DEPT OF [facility] [address] [place] [address] Dept: [phone] Dept Fax: [phone]   No orders of the defined types were placed in this encounter.     CHIEF COMPLAINT:  CC: Metastatic melanoma  Current Treatment: Maintenance nivolumab  every 4 weeks  HISTORY OF PRESENT ILLNESS:   Oncology History  Melanoma of skin (HCC)  12/01/2019 Initial Diagnosis   Melanoma of skin (HCC)   12/05/2019 Cancer Staging   Staging form: Melanoma of the Skin, AJCC 8th Edition - Clinical stage from 12/05/2019: Stage IIA (cT3a, cN0, cM0) - Signed by Cornelius Wanda VEAR, MD on 05/30/2020   02/18/2021 Genetic Testing   No pathogenic variants detected in [facility] Multi-Cancer +RNA Panel.  Variants of uncertain significance detected in APC (c.681C>G (p.Asp227Glu)), CDKN1C (c.610C>G (p.Pro204Ala)), and FH (c.1151C>T (p.Ala384Val)).  The report date is February 18, 2021.    The Multi-Cancer + RNA Panel offered by [facility] includes sequencing and/or deletion/duplication analysis of the following 84 genes:  AIP*, ALK, APC*, ATM*, AXIN2*, BAP1*, BARD1*, BLM*, BMPR1A*, BRCA1*, BRCA2*, BRIP1*, CASR, CDC73*,  CDH1*, CDK4, CDKN1B*, CDKN1C*, CDKN2A, CEBPA, CHEK2*, CTNNA1*, DICER1*, DIS3L2*, EGFR, EPCAM, FH*, FLCN*, GATA2*, GPC3, GREM1, HOXB13, HRAS, KIT, MAX*, MEN1*, MET, MITF, MLH1*, MSH2*, MSH3*, MSH6*, MUTYH*, NBN*, NF1*, NF2*, NTHL1*, PALB2*, PDGFRA, PHOX2B, PMS2*, POLD1*, POLE*, POT1*, PRKAR1A*, PTCH1*, PTEN*, RAD50*, RAD51C*, RAD51D*, RB1*, RECQL4, RET, RUNX1*, SDHA*, SDHAF2*, SDHB*, SDHC*, SDHD*, SMAD4*, SMARCA4*, SMARCB1*, SMARCE1*, STK11*, SUFU*, TERC, TERT, TMEM127*, Tp53*, TSC1*, TSC2*, VHL*, WRN*, and WT1.  RNA analysis is performed for * genes.   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 (HCC)   01/30/2022 - 02/20/2022 Chemotherapy   Patient is on Treatment Plan : MELANOMA Nivolumab  + Ipilimumab  (1/3) q21d / Nivolumab  q28d     01/30/2022 -  Chemotherapy   Patient is on Treatment Plan : MELANOMA Nivolumab  (480) q28d     Malignant melanoma metastatic to lung (  HCC)  12/19/2021 Initial Diagnosis   Malignant melanoma metastatic to lung (HCC)   01/30/2022 - 02/20/2022 Chemotherapy   Patient is on Treatment Plan : MELANOMA Nivolumab  + Ipilimumab  (1/3) q21d / Nivolumab  q28d     01/30/2022 -  Chemotherapy   Patient is on Treatment Plan : MELANOMA Nivolumab  (480) q28d     03/24/2022 Imaging   CTA chest:  IMPRESSION:  1. Small nonocclusive segmental level pulmonary embolism in the  inferior LEFT upper lobe.  2. Marked bronchial wall thickening with areas of patchy basilar  opacities and new ground-glass opacities in the upper lobes.  Constellation of findings may be infectious bronchiolitis/bronchitis  though the possibility of drug related changes in the setting of  immunotherapy are also  considered. Furthermore, given basilar  predominance and some material in basilar bronchial structures would  correlate with any risk factors for or history of aspiration.  3. Response to therapy of bilateral metastatic lesions. Some lymph  nodes in the chest with interval increase in size are equivocal at  this time, potentially reactive, but warrant attention on subsequent  imaging.    07/22/2022 Imaging   CT chest, abdomen and pelvis:  IMPRESSION:  1. Interval positive response to therapy. Resolved subcarinal and  bilateral hilar lymphadenopathy. Stable to decreased left pulmonary  nodules. No new or progressive metastatic disease in the chest,  abdomen or pelvis.  2. One vessel coronary atherosclerosis.  3. Aortic Atherosclerosis (ICD10-I70.0).    Malignant neoplasm metastatic to liver (HCC)  12/19/2021 Initial Diagnosis   Malignant neoplasm metastatic to liver (HCC)   01/30/2022 - 02/20/2022 Chemotherapy   Patient is on Treatment Plan : MELANOMA Nivolumab  + Ipilimumab  (1/3) q21d / Nivolumab  q28d     01/30/2022 -  Chemotherapy   Patient is on Treatment Plan : MELANOMA Nivolumab  (480) q28d     07/22/2022 Imaging   CT chest, abdomen and pelvis:  IMPRESSION:  1. Interval positive response to therapy. Resolved subcarinal and  bilateral hilar lymphadenopathy. Stable to decreased left pulmonary  nodules. No new or progressive metastatic disease in the chest,  abdomen or pelvis.  2. One vessel coronary atherosclerosis.  3. Aortic Atherosclerosis (ICD10-I70.0).        INTERVAL HISTORY:   Xavier Jones is here today for repeat clinical assessment prior to nivolumab .  He states he fell a few weeks ago at night when the power was out and developed large hematoma of the left hip.  He was seen at [facility] ED.  X-ray of the left hip was negative.  CT head was stable.  He denies fevers or chills. He reports left hip pain since the fall. His appetite is good. His weight has increased 7 pounds over  last 3 weeks.  He is using hydrocodone /APAP about 6 times daily and requests a refill of that today.  He states that is becoming more difficult to work and he is considering applying for disability.  REVIEW OF SYSTEMS:   Review of Systems  Constitutional:  Negative for appetite change, chills, fatigue, fever and unexpected weight change.  HENT:   Negative for lump/mass, mouth sores and sore throat.   Respiratory:  Negative for cough and shortness of breath.   Cardiovascular:  Negative for chest pain and leg swelling.  Gastrointestinal:  Negative for abdominal pain, constipation, diarrhea, nausea and vomiting.  Genitourinary:  Negative for difficulty urinating, dysuria, frequency and hematuria.   Musculoskeletal:  Negative for arthralgias, back pain, myalgias and neck pain.  Skin:  Negative for itching,  rash and wound.  Neurological:  Negative for dizziness, extremity weakness, headaches, light-headedness and numbness.  Hematological:  Negative for adenopathy. Does not bruise/bleed easily.  Psychiatric/Behavioral:  Negative for depression and sleep disturbance. The patient is not nervous/anxious.      VITALS:   Blood pressure 125/87, pulse 62, temperature 98.6 F (37 C), temperature source Oral, resp. rate 14, height 5' 9.5 (1.765 m), weight 176 lb (79.8 kg), SpO2 98%.  Wt Readings from Last 3 Encounters:  02/11/24 176 lb (79.8 kg)  01/15/24 169 lb 11.2 oz (77 kg)  01/06/24 170 lb (77.1 kg)    Body mass index is 25.62 kg/m.  Performance status (ECOG): 1 - Symptomatic but completely ambulatory    PHYSICAL EXAM:   Physical Exam Vitals and nursing note reviewed.  Constitutional:      General: He is not in acute distress.    Appearance: Normal appearance. He is normal weight.  HENT:     Head: Normocephalic and atraumatic.     Mouth/Throat:     Mouth: Mucous membranes are moist.     Pharynx: Oropharynx is clear. No oropharyngeal exudate or posterior oropharyngeal erythema.   Eyes:     General: No scleral icterus.    Extraocular Movements: Extraocular movements intact.     Conjunctiva/sclera: Conjunctivae normal.     Pupils: Pupils are equal, round, and reactive to light.  Cardiovascular:     Rate and Rhythm: Normal rate and regular rhythm.     Heart sounds: Normal heart sounds. No murmur heard.    No friction rub. No gallop.  Pulmonary:     Effort: Pulmonary effort is normal.     Breath sounds: Normal breath sounds. No wheezing, rhonchi or rales.  Abdominal:     General: Bowel sounds are normal. There is no distension.     Palpations: Abdomen is soft. There is no hepatomegaly, splenomegaly or mass.     Tenderness: There is no abdominal tenderness.  Musculoskeletal:        General: Normal range of motion.     Cervical back: Normal range of motion and neck supple. No tenderness.     Right lower leg: No edema.     Left lower leg: No edema.  Lymphadenopathy:     Cervical: No cervical adenopathy.     Upper Body:     Right upper body: No supraclavicular or axillary adenopathy.     Left upper body: No supraclavicular or axillary adenopathy.     Lower Body: No right inguinal adenopathy. No left inguinal adenopathy.  Skin:    General: Skin is warm and dry.     Coloration: Skin is not jaundiced.     Findings: No rash.     Comments: No new subcutaneous masses or suspicious skin lesions  Neurological:     Mental Status: He is alert and oriented to person, place, and time.     Cranial Nerves: No cranial nerve deficit.  Psychiatric:        Mood and Affect: Mood normal.        Behavior: Behavior normal.        Thought Content: Thought content normal.     LABS:      Latest Ref Rng & Units 02/11/2024    9:03 AM 01/13/2024   12:00 AM 12/18/2023    9:09 AM  CBC  WBC 4.0 - 10.5 K/uL 4.2  4.9     4.2   Hemoglobin 13.0 - 17.0 g/dL 88.3  13.0  12.7   Hematocrit 39.0 - 52.0 % 34.5  38     37.8   Platelets 150 - 400 K/uL 278  265     247      This result is  from an external source.      Latest Ref Rng & Units 02/11/2024    9:03 AM 01/13/2024   12:00 AM 12/18/2023    9:09 AM  CMP  Glucose 70 - 99 mg/dL 898   849   BUN 6 - 20 mg/dL 19  21     21    Creatinine 0.61 - 1.24 mg/dL 9.15  0.8     9.05   Sodium 135 - 145 mmol/L 141  137     141   Potassium 3.5 - 5.1 mmol/L 4.5  4.5     4.2   Chloride 98 - 111 mmol/L 107  104     105   CO2 22 - 32 mmol/L 26  31     26    Calcium 8.9 - 10.3 mg/dL 9.4  9.4     9.6   Total Protein 6.5 - 8.1 g/dL 6.7   6.5   Total Bilirubin 0.0 - 1.2 mg/dL 0.5   0.4   Alkaline Phos 38 - 126 U/L 73  70     84   AST 15 - 41 U/L 25  32     19   ALT 0 - 44 U/L 18  18     11       This result is from an external source.     Lab Results  Component Value Date   TOTALPROTELP 6.7 08/27/2023   TOTALPROTELP 6.7 08/27/2023   ALBUMINELP 3.9 08/27/2023   A1GS 0.2 08/27/2023   A2GS 0.7 08/27/2023   BETS 1.0 08/27/2023   GAMS 0.8 08/27/2023   MSPIKE Not Observed 08/27/2023   SPEI Comment 08/27/2023   Lab Results  Component Value Date   TIBC 382 06/02/2023   TIBC 297 05/21/2022   FERRITIN 22 (L) 06/02/2023   FERRITIN 41 05/21/2022   IRONPCTSAT 23 06/02/2023   IRONPCTSAT 18 05/21/2022   Lab Results  Component Value Date   LDH 139 05/21/2022   LDH 145 12/19/2021    STUDIES:   No results found.    HISTORY:   Past Medical History:  Diagnosis Date   Acute pulmonary embolism without acute cor pulmonale (HCC) 05/01/2022   Anemia 05/20/2022   Chemotherapy follow-up examination 04/21/2023   Degenerative disc disease, cervical 08/27/2023   Depression    mild   Family history of breast cancer 01/22/2021   Family history of prostate cancer 01/22/2021   Genetic testing 02/22/2021   No pathogenic variants detected in [facility] Multi-Cancer +RNA Panel.  Variants of uncertain significance detected in APC (c.681C>G (p.Asp227Glu)), CDKN1C (c.610C>G (p.Pro204Ala)), and FH (c.1151C>T (p.Ala384Val)).  The report date is February 18, 2021.       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) 12/19/2021  Melanoma of skin (HCC) 12/01/2019   Multiple dysplastic nevi 05/30/2020   Peripheral neuropathy 05/09/2023   Port-A-Cath in place 07/28/2022   Pruritic rash 02/18/2022   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   Right shoulder pain 02/18/2022   Skin cancer    melanoma    Past Surgical History:  Procedure Laterality Date   APPLICATION OF CRANIAL NAVIGATION N/A 01/03/2022   Procedure: APPLICATION OF CRANIAL NAVIGATION;  Surgeon: Lanis Pupa, MD;  Location: [facility] OR;  Service: Neurosurgery;  Laterality: N/A;   CRANIOTOMY Left 01/03/2022   Procedure: Stereotactic Left temporal craniotomy for resection of tumor;  Surgeon: Lanis Pupa, MD;  Location: [facility] OR;  Service: Neurosurgery;  Laterality: Left;   MELANOMA EXCISION WITH SENTINEL LYMPH NODE BIOPSY Right 2021   SEPTOPLASTY  05/13/2023   Dr Honor   SPINAL FUSION W/ LUQUE UNIT ROD  2003    Family History  Problem Relation Age of Onset   Prostate cancer Father        dx 33s   Breast cancer Maternal Aunt        dx after 82; bilateral mastectomy   Stomach cancer Maternal Grandfather        dx 55s   Breast cancer Cousin 12       maternal male cousin; mets    Social History:  reports that he quit smoking about 27 years ago. His smoking use included cigarettes. He has never used smokeless tobacco. He reports  current alcohol use of about 14.0 standard drinks of alcohol per week. He reports that he does not currently use drugs.The patient is alone today.  Allergies:  Allergies  Allergen Reactions   Sulfa Antibiotics Rash   Testosterone  Itching    Gel    Current Medications: Current Outpatient Medications  Medication Sig Dispense Refill   acebutolol  (SECTRAL ) 200 MG capsule TAKE ONE CAPSULE BY MOUTH EVERY DAY 90 capsule 1   acyclovir  (ZOVIRAX ) 400 MG tablet Take 400 mg by mouth 3 (three) times daily.     amitriptyline  (ELAVIL ) 25 MG tablet Take 50 mg by mouth at bedtime.     ergocalciferol (VITAMIN D2) 1.25 MG (50000 UT) capsule Take 50,000 Units by mouth every Wednesday.     folic acid  (FOLVITE ) 1 MG tablet TAKE ONE TABLET BY MOUTH DAILY 30 tablet 5   HYDROcodone -acetaminophen  (NORCO/VICODIN) 5-325 MG tablet Take 1 tablet by mouth every 4 (four) hours as needed for moderate pain (pain score 4-6). 150 tablet 0   hydrOXYzine  (ATARAX ) 25 MG tablet TAKE ONE TABLET BY MOUTH 3 TIMES A DAY AS NEEDED FOR ITCHING 60 tablet 3   lidocaine -prilocaine  (EMLA ) cream Apply 1 Application topically as needed. 30 g 0   lisinopril  (ZESTRIL ) 20 MG tablet TAKE ONE TABLET BY MOUTH DAILY 30 tablet 1   pregabalin  (LYRICA ) 100 MG capsule Take 1 capsule (100 mg total) by mouth 3 (three) times daily. 90 capsule 5   XARELTO  20 MG TABS tablet TAKE ONE TABLET BY MOUTH EVERY DAY WITH SUPPER. START AFTER 3 WEEKS OF 15MG 2 TIMES A DAY 30 tablet 3   No current facility-administered medications for this visit.

## 2024-02-11 NOTE — Patient Instructions (Signed)
 Pembrolizumab Injection What is this medication? PEMBROLIZUMAB (PEM broe LIZ ue mab) treats some types of cancer. It works by helping your immune system slow or stop the spread of cancer cells. It is a monoclonal antibody. This medicine may be used for other purposes; ask your health care provider or pharmacist if you have questions. COMMON BRAND NAME(S): Keytruda What should I tell my care team before I take this medication? They need to know if you have any of these conditions: Allogeneic stem cell transplant (uses someone else's stem cells) Autoimmune diseases, such as Crohn disease, ulcerative colitis, lupus History of chest radiation Nervous system problems, such as Guillain-Barre syndrome, myasthenia gravis Organ transplant An unusual or allergic reaction to pembrolizumab, other medications, foods, dyes, or preservatives Pregnant or trying to get pregnant Breast-feeding How should I use this medication? This medication is injected into a vein. It is given by your care team in a hospital or clinic setting. A special MedGuide will be given to you before each treatment. Be sure to read this information carefully each time. Talk to your care team about the use of this medication in children. While it may be prescribed for children as young as 6 months for selected conditions, precautions do apply. Overdosage: If you think you have taken too much of this medicine contact a poison control center or emergency room at once. NOTE: This medicine is only for you. Do not share this medicine with others. What if I miss a dose? Keep appointments for follow-up doses. It is important not to miss your dose. Call your care team if you are unable to keep an appointment. What may interact with this medication? Interactions have not been studied. This list may not describe all possible interactions. Give your health care provider a list of all the medicines, herbs, non-prescription drugs, or dietary  supplements you use. Also tell them if you smoke, drink alcohol, or use illegal drugs. Some items may interact with your medicine. What should I watch for while using this medication? Your condition will be monitored carefully while you are receiving this medication. You may need blood work while taking this medication. This medication may cause serious skin reactions. They can happen weeks to months after starting the medication. Contact your care team right away if you notice fevers or flu-like symptoms with a rash. The rash may be red or purple and then turn into blisters or peeling of the skin. You may also notice a red rash with swelling of the face, lips, or lymph nodes in your neck or under your arms. Tell your care team right away if you have any change in your eyesight. Talk to your care team if you may be pregnant. Serious birth defects can occur if you take this medication during pregnancy and for 4 months after the last dose. You will need a negative pregnancy test before starting this medication. Contraception is recommended while taking this medication and for 4 months after the last dose. Your care team can help you find the option that works for you. Do not breastfeed while taking this medication and for 4 months after the last dose. What side effects may I notice from receiving this medication? Side effects that you should report to your care team as soon as possible: Allergic reactions--skin rash, itching, hives, swelling of the face, lips, tongue, or throat Dry cough, shortness of breath or trouble breathing Eye pain, redness, irritation, or discharge with blurry or decreased vision Heart muscle inflammation--unusual weakness or  fatigue, shortness of breath, chest pain, fast or irregular heartbeat, dizziness, swelling of the ankles, feet, or hands Hormone gland problems--headache, sensitivity to light, unusual weakness or fatigue, dizziness, fast or irregular heartbeat, increased  sensitivity to cold or heat, excessive sweating, constipation, hair loss, increased thirst or amount of urine, tremors or shaking, irritability Infusion reactions--chest pain, shortness of breath or trouble breathing, feeling faint or lightheaded Kidney injury (glomerulonephritis)--decrease in the amount of urine, red or dark brown urine, foamy or bubbly urine, swelling of the ankles, hands, or feet Liver injury--right upper belly pain, loss of appetite, nausea, light-colored stool, dark yellow or brown urine, yellowing skin or eyes, unusual weakness or fatigue Pain, tingling, or numbness in the hands or feet, muscle weakness, change in vision, confusion or trouble speaking, loss of balance or coordination, trouble walking, seizures Rash, fever, and swollen lymph nodes Redness, blistering, peeling, or loosening of the skin, including inside the mouth Sudden or severe stomach pain, bloody diarrhea, fever, nausea, vomiting Side effects that usually do not require medical attention (report to your care team if they continue or are bothersome): Bone, joint, or muscle pain Diarrhea Fatigue Loss of appetite Nausea Skin rash This list may not describe all possible side effects. Call your doctor for medical advice about side effects. You may report side effects to FDA at 1-800-FDA-1088. Where should I keep my medication? This medication is given in a hospital or clinic. It will not be stored at home. NOTE: This sheet is a summary. It may not cover all possible information. If you have questions about this medicine, talk to your doctor, pharmacist, or health care provider.  2024 Elsevier/Gold Standard (2021-11-05 00:00:00)  [facility] [place] [facility]  Discharge Instructions: Thank you for choosing [facility] to provide your oncology and hematology care.  If you have a lab appointment with the Cancer Center, please go directly to the Cancer Center  and check in at the registration area.   Wear comfortable clothing and clothing appropriate for easy access to any Portacath or PICC line.   We strive to give you quality time with your provider. You may need to reschedule your appointment if you arrive late (15 or more minutes).  Arriving late affects you and other patients whose appointments are after yours.  Also, if you miss three or more appointments without notifying the office, you may be dismissed from the clinic at the provider's discretion.      For prescription refill requests, have your pharmacy contact our office and allow 72 hours for refills to be completed.    Today you received the following chemotherapy and/or immunotherapy agents PEMBROLIZUMAB      To help prevent nausea and vomiting after your treatment, we encourage you to take your nausea medication as directed.  BELOW ARE SYMPTOMS THAT SHOULD BE REPORTED IMMEDIATELY: *FEVER GREATER THAN 100.4 F (38 C) OR HIGHER *CHILLS OR SWEATING *NAUSEA AND VOMITING THAT IS NOT CONTROLLED WITH YOUR NAUSEA MEDICATION *UNUSUAL SHORTNESS OF BREATH *UNUSUAL BRUISING OR BLEEDING *URINARY PROBLEMS (pain or burning when urinating, or frequent urination) *BOWEL PROBLEMS (unusual diarrhea, constipation, pain near the anus) TENDERNESS IN MOUTH AND THROAT WITH OR WITHOUT PRESENCE OF ULCERS (sore throat, sores in mouth, or a toothache) UNUSUAL RASH, SWELLING OR PAIN  UNUSUAL VAGINAL DISCHARGE OR ITCHING   Items with * indicate a potential emergency and should be followed up as soon as possible or go to the Emergency Department  if any problems should occur.  Please show the CHEMOTHERAPY ALERT CARD or IMMUNOTHERAPY ALERT CARD at check-in to the Emergency Department and triage nurse.  Should you have questions after your visit or need to cancel or reschedule your appointment, please contact [facility] [place] [facility]  Dept: [phone]  and follow the  prompts.  Office hours are 8:00 a.m. to 4:30 p.m. Monday - Friday. Please note that voicemails left after 4:00 p.m. may not be returned until the following business day.  We are closed weekends and major holidays. You have access to a nurse at all times for urgent questions. Please call the main number to the clinic Dept: [phone] and follow the prompts.  For any non-urgent questions, you may also contact your provider using MyChart. We now offer e-Visits for anyone 78 and older to request care online for non-urgent symptoms. For details visit [email or web address].   Also download the MyChart app! Go to the app store, search "MyChart", open the app, select [facility], and log in with your MyChart username and password.

## 2024-02-12 LAB — T4: T4, Total: 6 ug/dL (ref 4.5–12.0)

## 2024-02-15 DIAGNOSIS — T148XXA Other injury of unspecified body region, initial encounter: Secondary | ICD-10-CM | POA: Diagnosis not present

## 2024-02-15 DIAGNOSIS — Z85820 Personal history of malignant melanoma of skin: Secondary | ICD-10-CM | POA: Diagnosis not present

## 2024-02-15 DIAGNOSIS — L578 Other skin changes due to chronic exposure to nonionizing radiation: Secondary | ICD-10-CM | POA: Diagnosis not present

## 2024-02-16 ENCOUNTER — Encounter: Payer: Self-pay | Admitting: Hematology and Oncology

## 2024-02-16 ENCOUNTER — Other Ambulatory Visit: Payer: Self-pay

## 2024-02-16 ENCOUNTER — Encounter: Payer: Self-pay | Admitting: Oncology

## 2024-02-16 NOTE — Assessment & Plan Note (Addendum)
 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  There was still a question as to whether this represented a metastatic lesion, rather than a primary, based on the presence of 2 nodules and no epidermal involvement.  MRI head and CT chest, abdomen and pelvis in June 2021 were negative for metastatic disease.   No adjuvant therapy was recommended.  PET scan in September was negative for metastatic disease.    In June 2023, he was found to have widespread metastatic disease, including to the brain, for which he continues nivolumab  every 4 weeks.

## 2024-02-16 NOTE — Assessment & Plan Note (Addendum)
 Brain metastasis diagnosed in June 2023.  PET scan was concerning for possible lesion in the left frontoparietal lobe.  MRI of the brain confirmed a 2.6 cm heterogeneous lesion of the left superior temporal gyrus with mild enhancement and extensive surrounding edema.  He also has an 8 mm cortical lesion of the inferomedial right frontal lobe with mild edema, a 2 mm mildly enhancing cortical lesion of the inferolateral left frontal lobe and a intrinsically T1 hyperintense lesion of the left lateral pons measuring 7 mm with surrounding edema.  He has 4 lesions in total, but 3 are less than 1 cm.  He was treated with surgical resection of the larger lesion and stereotactic radiation.    He had recurrent neurologic symptoms with left-sided facial droop and left-sided weakness in September 2023 and was placed on dexamethasone  4 mg 3 times daily.  Brain revealed thin linear enhancement along the resection margin of the left temporal resection cavity, which may be postoperative, with additional small areas of somewhat more nodular enhancement, which are nonspecific, but could indicate recurrent disease. Attention on follow-up. Interval increase in the size of the right inferior frontal gyrus enhancing lesion with increased surrounding edema, mild local mass effect, and mild local midline shift. Stable to slightly decreased size of the left pontine lesion with decreased associated edema. Previously noted left inferior frontal gyrus lesion is no longer visualized. No definite new lesion.  [name] slowly weaned him off dexamethasone .  MRI brain in December 2023 was stable.  He has had persistent left facial weakness.   MRI brain in March revealed increased size of a hemorrhagic left pons/middle cerebellar peduncle lesion with increased surrounding edema, similar versus slightly decreased size of a 13 x 10 mm enhancing lesion in the medial right frontal lobe, and mildly diminished intrinsic T1 hyperintensity of the left  temporal resection site with linear enhancement along the lateral margin, probably postsurgical/post radiation.  PET brain in April did not show hyperactivity, but more radiation necrosis, so these changes are more consistent with posttreatment changes.  MRI head in August revealed new punctate superior right frontal lobe enhancement compatible with interval metastasis, interval regression of enhancement and edema in the left brachium pontis and pons. There was also a vasogenic edema at a pre-existing right frontal metastasis with underlying lesion enhancement remaining stable.  [name] recommended continued observation and saw him in December 2024 with a repeat MRI brain.  This looks quite good and the punctate lesion seen on the last scan has resolved.  We see no new lesions and overall the scan looks good, so [name] will repeat it 4 months this time.  This was suspicious on the PET scan in the left frontoparietal lobe.  MRI of the brain confirmed a 2.6 cm heterogeneous lesion of the left superior temporal gyrus with mild enhancement and extensive surrounding edema.  He also has an 8 mm cortical lesion of the inferomedial right frontal lobe with mild edema, a 2 mm mildly enhancing cortical lesion of the inferolateral left frontal lobe and a intrinsically T1 hyperintense lesion of the left lateral pons measuring 7 mm with surrounding edema.  He has 4 lesions in total, but 3 are less than 1 cm.  He was treated with surgical resection of the larger lesion and stereotactic radiation.    He had recurrent neurologic symptoms with left-sided facial droop and left-sided weakness in September 2023 and was placed on dexamethasone  4 mg 3 times daily.  Brain revealed thin linear  enhancement along the resection margin of the left temporal resection cavity, which may be postoperative, with additional small areas of somewhat more nodular enhancement, which are nonspecific, but could indicate recurrent disease. Attention  on follow-up. Interval increase in the size of the right inferior frontal gyrus enhancing lesion with increased surrounding edema, mild local mass effect, and mild local midline shift. Stable to slightly decreased size of the left pontine lesion with decreased associated edema. Previously noted left inferior frontal gyrus lesion is no longer visualized. No definite new lesion.  [name] slowly weaned him off dexamethasone .  MRI brain in December 2023 was stable.  He has had persistent left facial weakness.   MRI brain in March revealed increased size of a hemorrhagic left pons/middle cerebellar peduncle lesion with increased surrounding edema, similar versus slightly decreased size of a 13 x 10 mm enhancing lesion in the medial right frontal lobe, and mildly diminished intrinsic T1 hyperintensity of the left temporal resection site with linear enhancement along the lateral margin, probably postsurgical/post radiation.  PET brain in April did not show hyperactivity, but more radiation necrosis, so these changes are more consistent with posttreatment changes.  MRI head in August revealed new punctate superior right frontal lobe enhancement compatible with interval metastasis, interval regression of enhancement and edema in the left brachium pontis and pons. There was also a vasogenic edema at a pre-existing right frontal metastasis with underlying lesion enhancement remaining stable.  [name] recommended continued observation.  He has had chronic lightheadedness, hearing loss, and tinnitus that has been going on since radiation to the head, which worsens with loud noise, and he works in a loud environment. He has seen a ENT about this to no avail. This is felt to be due to radiation.  [name] saw him in December 2024 with MRI brain, which looked quite good and the punctate lesion seen on the last scan had resolved.  MRI brain in April revealed continued stable treated metastases of the left brainstem,  right inferior frontal gyrus, left temporal lobe resection site. Additional punctate enhancement in the central left cerebellum, intermittently visible over this series of exams, and associated with a rounded 8 mm area of FLAIR hyperintensity were stable since August 2024, although larger than in 2023. This is most likely an additional tiny treated metastasis. No new metastatic disease identified.  He is scheduled to see [name] again in October with repeat MRI brain.

## 2024-02-16 NOTE — Assessment & Plan Note (Addendum)
 Metastatic melanoma to lung diagnosed in found on routine CT imaging in May 2023.  There was over 20 bilateral pulmonary nodules which increased in size by the time treatment was started. The largest 1 nodule is up to 2.3 cm in diameter.  There were also 4 subcutaneous nodules of the back.  Soft tissue needle core biopsy of a right back subcutaneous nodule revealed metastatic malignant melanoma.  He was initially treated with 4 cycles of ipilimumab /nivolumab  with a good response. He has continued maintenance nivolumab .    Most recent CT imaging in July 2025 revealed a stable subpleural nodule of the left lower lobe, otherwise no metastatic disease was seen.  He is doing well at this time, so we will proceed with nivolumab  today.  We will plan to see him back in 4 weeks with a CBC, comprehensive metabolic panel and TSH prior to his next cycle of nivolumab .

## 2024-02-22 ENCOUNTER — Other Ambulatory Visit: Payer: Self-pay | Admitting: Cardiology

## 2024-02-22 ENCOUNTER — Encounter: Payer: Self-pay | Admitting: Oncology

## 2024-03-03 ENCOUNTER — Encounter: Payer: Self-pay | Admitting: Oncology

## 2024-03-04 ENCOUNTER — Other Ambulatory Visit: Payer: Self-pay | Admitting: Oncology

## 2024-03-04 DIAGNOSIS — M5126 Other intervertebral disc displacement, lumbar region: Secondary | ICD-10-CM

## 2024-03-04 DIAGNOSIS — M79604 Pain in right leg: Secondary | ICD-10-CM

## 2024-03-04 DIAGNOSIS — G629 Polyneuropathy, unspecified: Secondary | ICD-10-CM

## 2024-03-08 ENCOUNTER — Other Ambulatory Visit: Payer: Self-pay

## 2024-03-08 DIAGNOSIS — I10 Essential (primary) hypertension: Secondary | ICD-10-CM

## 2024-03-08 MED ORDER — LISINOPRIL 20 MG PO TABS: 20.0000 mg | ORAL_TABLET | Freq: Every day | ORAL | 1 refills | Status: DC

## 2024-03-09 ENCOUNTER — Encounter: Payer: Self-pay | Admitting: Oncology

## 2024-03-09 DIAGNOSIS — G510 Bell's palsy: Secondary | ICD-10-CM | POA: Diagnosis not present

## 2024-03-09 DIAGNOSIS — R0981 Nasal congestion: Secondary | ICD-10-CM | POA: Diagnosis not present

## 2024-03-09 DIAGNOSIS — I10 Essential (primary) hypertension: Secondary | ICD-10-CM | POA: Diagnosis not present

## 2024-03-10 ENCOUNTER — Encounter: Payer: Self-pay | Admitting: Oncology

## 2024-03-10 ENCOUNTER — Other Ambulatory Visit: Payer: Self-pay

## 2024-03-10 ENCOUNTER — Inpatient Hospital Stay

## 2024-03-10 ENCOUNTER — Inpatient Hospital Stay (HOSPITAL_BASED_OUTPATIENT_CLINIC_OR_DEPARTMENT_OTHER): Admitting: Hematology and Oncology

## 2024-03-10 ENCOUNTER — Inpatient Hospital Stay: Attending: Hematology and Oncology

## 2024-03-10 VITALS — BP 126/79 | HR 73 | Temp 98.2°F | Resp 16 | Ht 69.5 in | Wt 175.0 lb

## 2024-03-10 DIAGNOSIS — C7802 Secondary malignant neoplasm of left lung: Secondary | ICD-10-CM | POA: Insufficient documentation

## 2024-03-10 DIAGNOSIS — C7931 Secondary malignant neoplasm of brain: Secondary | ICD-10-CM | POA: Insufficient documentation

## 2024-03-10 DIAGNOSIS — C787 Secondary malignant neoplasm of liver and intrahepatic bile duct: Secondary | ICD-10-CM

## 2024-03-10 DIAGNOSIS — C439 Malignant melanoma of skin, unspecified: Secondary | ICD-10-CM

## 2024-03-10 DIAGNOSIS — Z5112 Encounter for antineoplastic immunotherapy: Secondary | ICD-10-CM | POA: Insufficient documentation

## 2024-03-10 DIAGNOSIS — C7801 Secondary malignant neoplasm of right lung: Secondary | ICD-10-CM | POA: Diagnosis not present

## 2024-03-10 DIAGNOSIS — Z87891 Personal history of nicotine dependence: Secondary | ICD-10-CM | POA: Insufficient documentation

## 2024-03-10 DIAGNOSIS — C7800 Secondary malignant neoplasm of unspecified lung: Secondary | ICD-10-CM

## 2024-03-10 DIAGNOSIS — Z79620 Long term (current) use of immunosuppressive biologic: Secondary | ICD-10-CM | POA: Diagnosis not present

## 2024-03-10 DIAGNOSIS — C4361 Malignant melanoma of right upper limb, including shoulder: Secondary | ICD-10-CM | POA: Insufficient documentation

## 2024-03-10 LAB — CMP (CANCER CENTER ONLY)
ALT: 15 U/L (ref 0–44)
AST: 18 U/L (ref 15–41)
Albumin: 4.4 g/dL (ref 3.5–5.0)
Alkaline Phosphatase: 71 U/L (ref 38–126)
Anion gap: 10 (ref 5–15)
BUN: 18 mg/dL (ref 6–20)
CO2: 25 mmol/L (ref 22–32)
Calcium: 9.2 mg/dL (ref 8.9–10.3)
Chloride: 109 mmol/L (ref 98–111)
Creatinine: 0.99 mg/dL (ref 0.61–1.24)
GFR, Estimated: >60 mL/min (ref >60)
Glucose, Bld: 112 mg/dL — ABNORMAL HIGH (ref 70–99)
Potassium: 4.5 mmol/L (ref 3.5–5.1)
Sodium: 145 mmol/L (ref 135–145)
Total Bilirubin: 0.4 mg/dL (ref 0.0–1.2)
Total Protein: 6.7 g/dL (ref 6.5–8.1)

## 2024-03-10 LAB — CBC WITH DIFFERENTIAL (CANCER CENTER ONLY)
Abs Immature Granulocytes: 0.01 K/uL (ref 0.00–0.07)
Basophils Absolute: 0.1 K/uL (ref 0.0–0.1)
Basophils Relative: 1 %
Eosinophils Absolute: 0.1 K/uL (ref 0.0–0.5)
Eosinophils Relative: 3 %
HCT: 37.1 % — ABNORMAL LOW (ref 39.0–52.0)
Hemoglobin: 12.1 g/dL — ABNORMAL LOW (ref 13.0–17.0)
Immature Granulocytes: 0 %
Lymphocytes Relative: 37 %
Lymphs Abs: 1.5 K/uL (ref 0.7–4.0)
MCH: 31.9 pg (ref 26.0–34.0)
MCHC: 32.6 g/dL (ref 30.0–36.0)
MCV: 97.9 fL (ref 80.0–100.0)
Monocytes Absolute: 0.5 K/uL (ref 0.1–1.0)
Monocytes Relative: 12 %
Neutro Abs: 1.8 K/uL (ref 1.7–7.7)
Neutrophils Relative %: 47 %
Platelet Count: 263 K/uL (ref 150–400)
RBC: 3.79 MIL/uL — ABNORMAL LOW (ref 4.22–5.81)
RDW: 12.4 % (ref 11.5–15.5)
WBC Count: 3.9 K/uL — ABNORMAL LOW (ref 4.0–10.5)
nRBC: 0 % (ref 0.0–0.2)

## 2024-03-10 LAB — TSH: TSH: 1.35 u[IU]/mL (ref 0.350–4.500)

## 2024-03-10 MED ORDER — SODIUM CHLORIDE 0.9 % IV SOLN
480.0000 mg | Freq: Once | INTRAVENOUS | Status: AC
  Administered 2024-03-10: 480 mg via INTRAVENOUS
  Filled 2024-03-10: qty 48

## 2024-03-10 MED ORDER — SODIUM CHLORIDE 0.9 % IV SOLN: Freq: Once | INTRAVENOUS | Status: AC

## 2024-03-10 NOTE — Patient Instructions (Signed)
 Nivolumab Injection What is this medication? NIVOLUMAB (nye VOL ue mab) treats some types of cancer. It works by helping your immune system slow or stop the spread of cancer cells. It is a monoclonal antibody. This medicine may be used for other purposes; ask your health care provider or pharmacist if you have questions. COMMON BRAND NAME(S): Opdivo What should I tell my care team before I take this medication? They need to know if you have any of these conditions: Allogeneic stem cell transplant (uses someone else's stem cells) Autoimmune diseases, such as Crohn disease, ulcerative colitis, lupus History of chest radiation Nervous system problems, such as Guillain-Barre syndrome or myasthenia gravis Organ transplant An unusual or allergic reaction to nivolumab, other medications, foods, dyes, or preservatives Pregnant or trying to get pregnant Breast-feeding How should I use this medication? This medication is infused into a vein. It is given in a hospital or clinic setting. A special MedGuide will be given to you before each treatment. Be sure to read this information carefully each time. Talk to your care team about the use of this medication in children. While it may be prescribed for children as young as 12 years for selected conditions, precautions do apply. Overdosage: If you think you have taken too much of this medicine contact a poison control center or emergency room at once. NOTE: This medicine is only for you. Do not share this medicine with others. What if I miss a dose? Keep appointments for follow-up doses. It is important not to miss your dose. Call your care team if you are unable to keep an appointment. What may interact with this medication? Interactions have not been studied. This list may not describe all possible interactions. Give your health care provider a list of all the medicines, herbs, non-prescription drugs, or dietary supplements you use. Also tell them if you  smoke, drink alcohol, or use illegal drugs. Some items may interact with your medicine. What should I watch for while using this medication? Your condition will be monitored carefully while you are receiving this medication. You may need blood work while taking this medication. This medication may cause serious skin reactions. They can happen weeks to months after starting the medication. Contact your care team right away if you notice fevers or flu-like symptoms with a rash. The rash may be red or purple and then turn into blisters or peeling of the skin. You may also notice a red rash with swelling of the face, lips, or lymph nodes in your neck or under your arms. Tell your care team right away if you have any change in your eyesight. Talk to your care team if you are pregnant or think you might be pregnant. A negative pregnancy test is required before starting this medication. A reliable form of contraception is recommended while taking this medication and for 5 months after the last dose. Talk to your care team about effective forms of contraception. Do not breast-feed while taking this medication and for 5 months after the last dose. What side effects may I notice from receiving this medication? Side effects that you should report to your care team as soon as possible: Allergic reactions--skin rash, itching, hives, swelling of the face, lips, tongue, or throat Dry cough, shortness of breath or trouble breathing Eye pain, redness, irritation, or discharge with blurry or decreased vision Heart muscle inflammation--unusual weakness or fatigue, shortness of breath, chest pain, fast or irregular heartbeat, dizziness, swelling of the ankles, feet, or hands Hormone  gland problems--headache, sensitivity to light, unusual weakness or fatigue, dizziness, fast or irregular heartbeat, increased sensitivity to cold or heat, excessive sweating, constipation, hair loss, increased thirst or amount of urine,  tremors or shaking, irritability Infusion reactions--chest pain, shortness of breath or trouble breathing, feeling faint or lightheaded Kidney injury (glomerulonephritis)--decrease in the amount of urine, red or dark brown urine, foamy or bubbly urine, swelling of the ankles, hands, or feet Liver injury--right upper belly pain, loss of appetite, nausea, light-colored stool, dark yellow or brown urine, yellowing skin or eyes, unusual weakness or fatigue Pain, tingling, or numbness in the hands or feet, muscle weakness, change in vision, confusion or trouble speaking, loss of balance or coordination, trouble walking, seizures Rash, fever, and swollen lymph nodes Redness, blistering, peeling, or loosening of the skin, including inside the mouth Sudden or severe stomach pain, bloody diarrhea, fever, nausea, vomiting Side effects that usually do not require medical attention (report these to your care team if they continue or are bothersome): Bone, joint, or muscle pain Diarrhea Fatigue Loss of appetite Nausea Skin rash This list may not describe all possible side effects. Call your doctor for medical advice about side effects. You may report side effects to FDA at 1-800-FDA-1088. Where should I keep my medication? This medication is given in a hospital or clinic. It will not be stored at home. NOTE: This sheet is a summary. It may not cover all possible information. If you have questions about this medicine, talk to your doctor, pharmacist, or health care provider.  2024 Elsevier/Gold Standard (2021-10-21 00:00:00)

## 2024-03-10 NOTE — Progress Notes (Signed)
 [facility]  [address] [place],  [place]  [address] [phone]  Clinic Day:  03/10/2024  Referring physician: [name], NP  ASSESSMENT & PLAN:   Assessment & Plan:   [name], Breslow's depth 3.3 mm, Clark level IV, treated with wide local excision, diagnosed in May 2021.  There was still a question as to whether this represented a metastatic lesion, rather than a primary, based on the presence of 2 nodules and no epidermal involvement.  MRI head and CT chest, abdomen and pelvis in June 2021 were negative for metastatic disease.   No adjuvant therapy was recommended.  PET scan in September was negative for metastatic disease.     In June 2023, he was found to have widespread metastatic disease, including to the brain, for which he continues nivolumab  every 4 weeks.   Malignant melanoma metastatic to lung [facility]) Metastatic melanoma to lung diagnosed in found on routine CT imaging in May 2023.  There was over 20 bilateral pulmonary nodules which increased in size by the time treatment was started. The largest 1 nodule is up to 2.3 cm in diameter.  There were also 4 subcutaneous nodules of the back.  Soft tissue needle core biopsy of a right back subcutaneous nodule revealed metastatic malignant melanoma.  He was initially treated with 4 cycles of ipilimumab /nivolumab  with a good response. He has continued maintenance nivolumab .     Most recent CT imaging in July 2025 revealed a stable subpleural nodule of the left lower lobe, otherwise no metastatic disease was seen.  He is doing well at this time, so we will proceed with nivolumab  today.  We will plan to see him back in 4 weeks with a CBC, comprehensive metabolic panel and TSH prior to his next cycle of nivolumab .   Malignant melanoma metastatic to brain [facility]) Brain metastasis diagnosed in June 2023.  PET scan was concerning for possible  lesion in the left frontoparietal lobe.  MRI of the brain confirmed a 2.6 cm heterogeneous lesion of the left superior temporal gyrus with mild enhancement and extensive surrounding edema.  He also has an 8 mm cortical lesion of the inferomedial right frontal lobe with mild edema, a 2 mm mildly enhancing cortical lesion of the inferolateral left frontal lobe and a intrinsically T1 hyperintense lesion of the left lateral pons measuring 7 mm with surrounding edema.  He has 4 lesions in total, but 3 are less than 1 cm.  He was treated with surgical resection of the larger lesion and stereotactic radiation.    He had recurrent neurologic symptoms with left-sided facial droop and left-sided weakness in September 2023 and was placed on dexamethasone  4 mg 3 times daily.  Brain revealed thin linear enhancement along the resection margin of the left temporal resection cavity, which may be postoperative, with additional small areas of somewhat more nodular enhancement, which are nonspecific, but could indicate recurrent disease. Attention on follow-up. Interval increase in the size of the right inferior frontal gyrus enhancing lesion with increased surrounding edema, mild local mass effect, and mild local midline shift. Stable to slightly decreased size of the left pontine lesion with decreased associated edema. Previously noted left inferior frontal gyrus lesion is no longer visualized. No definite new lesion.  [name] slowly weaned him off dexamethasone .  MRI brain in December 2023 was stable.  He has had persistent left facial weakness.   MRI  brain in March revealed increased size of a hemorrhagic left pons/middle cerebellar peduncle lesion with increased surrounding edema, similar versus slightly decreased size of a 13 x 10 mm enhancing lesion in the medial right frontal lobe, and mildly diminished intrinsic T1 hyperintensity of the left temporal resection site with linear enhancement along the lateral margin,  probably postsurgical/post radiation.  PET brain in April did not show hyperactivity, but more radiation necrosis, so these changes are more consistent with posttreatment changes.  MRI head in August revealed new punctate superior right frontal lobe enhancement compatible with interval metastasis, interval regression of enhancement and edema in the left brachium pontis and pons. There was also a vasogenic edema at a pre-existing right frontal metastasis with underlying lesion enhancement remaining stable.  [name] recommended continued observation and saw him in December 2024 with a repeat MRI brain.  This looks quite good and the punctate lesion seen on the last scan has resolved.  We see no new lesions and overall the scan looks good, so [name] will repeat it 4 months this time.  This was suspicious on the PET scan in the left frontoparietal lobe.  MRI of the brain confirmed a 2.6 cm heterogeneous lesion of the left superior temporal gyrus with mild enhancement and extensive surrounding edema.  He also has an 8 mm cortical lesion of the inferomedial right frontal lobe with mild edema, a 2 mm mildly enhancing cortical lesion of the inferolateral left frontal lobe and a intrinsically T1 hyperintense lesion of the left lateral pons measuring 7 mm with surrounding edema.  He has 4 lesions in total, but 3 are less than 1 cm.  He was treated with surgical resection of the larger lesion and stereotactic radiation.    He had recurrent neurologic symptoms with left-sided facial droop and left-sided weakness in September 2023 and was placed on dexamethasone  4 mg 3 times daily.  Brain revealed thin linear enhancement along the resection margin of the left temporal resection cavity, which may be postoperative, with additional small areas of somewhat more nodular enhancement, which are nonspecific, but could indicate recurrent disease. Attention on follow-up. Interval increase in the size of the right inferior frontal  gyrus enhancing lesion with increased surrounding edema, mild local mass effect, and mild local midline shift. Stable to slightly decreased size of the left pontine lesion with decreased associated edema. Previously noted left inferior frontal gyrus lesion is no longer visualized. No definite new lesion.  [name] slowly weaned him off dexamethasone .  MRI brain in December 2023 was stable.  He has had persistent left facial weakness.   MRI brain in March revealed increased size of a hemorrhagic left pons/middle cerebellar peduncle lesion with increased surrounding edema, similar versus slightly decreased size of a 13 x 10 mm enhancing lesion in the medial right frontal lobe, and mildly diminished intrinsic T1 hyperintensity of the left temporal resection site with linear enhancement along the lateral margin, probably postsurgical/post radiation.  PET brain in April did not show hyperactivity, but more radiation necrosis, so these changes are more consistent with posttreatment changes.  MRI head in August revealed new punctate superior right frontal lobe enhancement compatible with interval metastasis, interval regression of enhancement and edema in the left brachium pontis and pons. There was also a vasogenic edema at a pre-existing right frontal metastasis with underlying lesion enhancement remaining stable.  [name] recommended continued observation.   He has had chronic lightheadedness, hearing loss, and tinnitus that has been going on  since radiation to the head, which worsens with loud noise, and he works in a loud environment. He has seen a ENT about this to no avail. This is felt to be due to radiation.   [name] saw him in December 2024 with MRI brain, which looked quite good and the punctate lesion seen on the last scan had resolved.  MRI brain in April revealed continued stable treated metastases of the left brainstem, right inferior frontal gyrus, left temporal lobe resection site.  Additional punctate enhancement in the central left cerebellum, intermittently visible over this series of exams, and associated with a rounded 8 mm area of FLAIR hyperintensity were stable since August 2024, although larger than in 2023. This is most likely an additional tiny treated metastasis. No new metastatic disease identified.   He is scheduled to see [name] again in October with repeat MRI brain.  The patient understands the plans discussed today and is in agreement with them.  He knows to contact our office if he develops concerns prior to his next appointment.   I provided 20 minutes of face-to-face time during this encounter and > 50% was spent counseling as documented under my assessment and plan.    Eleanor DELENA Bach, NP  [facility] - A DEPT OF [facility] [address] [place] [address] Dept: [phone] Dept Fax: [phone]   No orders of the defined types were placed in this encounter.     CHIEF COMPLAINT:  CC: Metastatic melanoma  Current Treatment: Maintenance nivolumab  every 4 weeks  HISTORY OF PRESENT ILLNESS:   Oncology History  Melanoma of skin (HCC)  12/01/2019 Initial Diagnosis   Melanoma of skin (HCC)   12/05/2019 Cancer Staging   Staging form: Melanoma of the Skin, AJCC 8th Edition - Clinical stage from 12/05/2019: Stage IIA (cT3a, cN0, cM0) - Signed by Cornelius Wanda VEAR, MD on 05/30/2020   02/18/2021 Genetic Testing   No pathogenic variants detected in [facility] Multi-Cancer +RNA Panel.  Variants of uncertain significance detected in APC (c.681C>G (p.Asp227Glu)), CDKN1C (c.610C>G (p.Pro204Ala)), and FH (c.1151C>T (p.Ala384Val)).  The report date is February 18, 2021.    The Multi-Cancer + RNA Panel offered by [facility] includes sequencing and/or deletion/duplication analysis of the following 84 genes:  AIP*, ALK, APC*, ATM*, AXIN2*, BAP1*, BARD1*, BLM*, BMPR1A*, BRCA1*, BRCA2*, BRIP1*,  CASR, CDC73*, CDH1*, CDK4, CDKN1B*, CDKN1C*, CDKN2A, CEBPA, CHEK2*, CTNNA1*, DICER1*, DIS3L2*, EGFR, EPCAM, FH*, FLCN*, GATA2*, GPC3, GREM1, HOXB13, HRAS, KIT, MAX*, MEN1*, MET, MITF, MLH1*, MSH2*, MSH3*, MSH6*, MUTYH*, NBN*, NF1*, NF2*, NTHL1*, PALB2*, PDGFRA, PHOX2B, PMS2*, POLD1*, POLE*, POT1*, PRKAR1A*, PTCH1*, PTEN*, RAD50*, RAD51C*, RAD51D*, RB1*, RECQL4, RET, RUNX1*, SDHA*, SDHAF2*, SDHB*, SDHC*, SDHD*, SMAD4*, SMARCA4*, SMARCB1*, SMARCE1*, STK11*, SUFU*, TERC, TERT, TMEM127*, Tp53*, TSC1*, TSC2*, VHL*, WRN*, and WT1.  RNA analysis is performed for * genes.   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 (HCC)   01/30/2022 - 02/20/2022 Chemotherapy   Patient is on Treatment Plan : MELANOMA Nivolumab  + Ipilimumab  (1/3) q21d / Nivolumab  q28d     01/30/2022 -  Chemotherapy   Patient is on Treatment Plan : MELANOMA Nivolumab  (480) q28d  Malignant melanoma metastatic to lung (HCC)  12/19/2021 Initial Diagnosis   Malignant melanoma metastatic to lung (HCC)   01/30/2022 - 02/20/2022 Chemotherapy   Patient is on Treatment Plan : MELANOMA Nivolumab  + Ipilimumab  (1/3) q21d / Nivolumab  q28d     01/30/2022 -  Chemotherapy   Patient is on Treatment Plan : MELANOMA Nivolumab  (480) q28d     03/24/2022 Imaging   CTA chest:  IMPRESSION:  1. Small nonocclusive segmental level pulmonary embolism in the  inferior LEFT upper lobe.  2. Marked bronchial wall thickening with areas of patchy basilar  opacities and new ground-glass opacities in the upper lobes.  Constellation of findings may be infectious bronchiolitis/bronchitis  though the possibility of drug related changes in the setting of  immunotherapy are  also considered. Furthermore, given basilar  predominance and some material in basilar bronchial structures would  correlate with any risk factors for or history of aspiration.  3. Response to therapy of bilateral metastatic lesions. Some lymph  nodes in the chest with interval increase in size are equivocal at  this time, potentially reactive, but warrant attention on subsequent  imaging.    07/22/2022 Imaging   CT chest, abdomen and pelvis:  IMPRESSION:  1. Interval positive response to therapy. Resolved subcarinal and  bilateral hilar lymphadenopathy. Stable to decreased left pulmonary  nodules. No new or progressive metastatic disease in the chest,  abdomen or pelvis.  2. One vessel coronary atherosclerosis.  3. Aortic Atherosclerosis (ICD10-I70.0).    Malignant neoplasm metastatic to liver (HCC)  12/19/2021 Initial Diagnosis   Malignant neoplasm metastatic to liver (HCC)   01/30/2022 - 02/20/2022 Chemotherapy   Patient is on Treatment Plan : MELANOMA Nivolumab  + Ipilimumab  (1/3) q21d / Nivolumab  q28d     01/30/2022 -  Chemotherapy   Patient is on Treatment Plan : MELANOMA Nivolumab  (480) q28d     07/22/2022 Imaging   CT chest, abdomen and pelvis:  IMPRESSION:  1. Interval positive response to therapy. Resolved subcarinal and  bilateral hilar lymphadenopathy. Stable to decreased left pulmonary  nodules. No new or progressive metastatic disease in the chest,  abdomen or pelvis.  2. One vessel coronary atherosclerosis.  3. Aortic Atherosclerosis (ICD10-I70.0).        INTERVAL HISTORY:   Xavier Jones is here today for repeat clinical assessment prior to nivolumab .  He states he fell a few weeks ago at night when the power was out and developed large hematoma of the left hip.  He was seen at [facility] ED.  X-ray of the left hip was negative.  CT head was stable.  He denies fevers or chills. He reports left hip pain since the fall. His appetite is good. His weight has increased 7 pounds  over last 3 weeks.  He is using hydrocodone /APAP about 6 times daily and requests a refill of that today.  He states that is becoming more difficult to work and he is considering applying for disability.  REVIEW OF SYSTEMS:   Review of Systems  Constitutional:  Negative for appetite change, chills, fatigue, fever and unexpected weight change.  HENT:   Negative for lump/mass, mouth sores and sore throat.   Respiratory:  Negative for cough and shortness of breath.   Cardiovascular:  Negative for chest pain and leg swelling.  Gastrointestinal:  Negative for abdominal pain, constipation, diarrhea, nausea and vomiting.  Genitourinary:  Negative for difficulty urinating, dysuria, frequency and hematuria.   Musculoskeletal:  Negative for arthralgias, back pain, myalgias and neck pain.  Skin:  Negative for itching, rash and wound.  Neurological:  Negative for dizziness, extremity weakness, headaches, light-headedness and numbness.  Hematological:  Negative for adenopathy. Does not bruise/bleed easily.  Psychiatric/Behavioral:  Negative for depression and sleep disturbance. The patient is not nervous/anxious.      VITALS:   Blood pressure 126/79, pulse 73, temperature 98.2 F (36.8 C), temperature source Oral, resp. rate 16, height 5' 9.5 (1.765 m), weight 175 lb (79.4 kg), SpO2 100%.  Wt Readings from Last 3 Encounters:  03/10/24 175 lb (79.4 kg)  02/11/24 176 lb (79.8 kg)  01/15/24 169 lb 11.2 oz (77 kg)    Body mass index is 25.47 kg/m.  Performance status (ECOG): 1 - Symptomatic but completely ambulatory    PHYSICAL EXAM:   Physical Exam Vitals and nursing note reviewed.  Constitutional:      General: He is not in acute distress.    Appearance: Normal appearance. He is normal weight.  HENT:     Head: Normocephalic and atraumatic.     Mouth/Throat:     Mouth: Mucous membranes are moist.     Pharynx: Oropharynx is clear. No oropharyngeal exudate or posterior oropharyngeal  erythema.  Eyes:     General: No scleral icterus.    Extraocular Movements: Extraocular movements intact.     Conjunctiva/sclera: Conjunctivae normal.     Pupils: Pupils are equal, round, and reactive to light.  Cardiovascular:     Rate and Rhythm: Normal rate and regular rhythm.     Heart sounds: Normal heart sounds. No murmur heard.    No friction rub. No gallop.  Pulmonary:     Effort: Pulmonary effort is normal.     Breath sounds: Normal breath sounds. No wheezing, rhonchi or rales.  Abdominal:     General: Bowel sounds are normal. There is no distension.     Palpations: Abdomen is soft. There is no hepatomegaly, splenomegaly or mass.     Tenderness: There is no abdominal tenderness.  Musculoskeletal:        General: Normal range of motion.     Cervical back: Normal range of motion and neck supple. No tenderness.     Right lower leg: No edema.     Left lower leg: No edema.  Lymphadenopathy:     Cervical: No cervical adenopathy.     Upper Body:     Right upper body: No supraclavicular or axillary adenopathy.     Left upper body: No supraclavicular or axillary adenopathy.     Lower Body: No right inguinal adenopathy. No left inguinal adenopathy.  Skin:    General: Skin is warm and dry.     Coloration: Skin is not jaundiced.     Findings: No rash.     Comments: No new subcutaneous masses or suspicious skin lesions  Neurological:     Mental Status: He is alert and oriented to person, place, and time.     Cranial Nerves: No cranial nerve deficit.  Psychiatric:        Mood and Affect: Mood normal.        Behavior: Behavior normal.        Thought Content: Thought content normal.     LABS:      Latest Ref Rng & Units 03/10/2024    9:00 AM 02/11/2024    9:03 AM 01/13/2024   12:00 AM  CBC  WBC 4.0 - 10.5 K/uL 3.9  4.2  4.9      Hemoglobin 13.0 - 17.0  g/dL 87.8  88.3  86.9      Hematocrit 39.0 - 52.0 % 37.1  34.5  38      Platelets 150 - 400 K/uL 263  278  265         This  result is from an external source.      Latest Ref Rng & Units 03/10/2024    9:00 AM 02/11/2024    9:03 AM 01/13/2024   12:00 AM  CMP  Glucose 70 - 99 mg/dL 887  898    BUN 6 - 20 mg/dL 18  19  21       Creatinine 0.61 - 1.24 mg/dL 9.00  9.15  0.8      Sodium 135 - 145 mmol/L 145  141  137      Potassium 3.5 - 5.1 mmol/L 4.5  4.5  4.5      Chloride 98 - 111 mmol/L 109  107  104      CO2 22 - 32 mmol/L 25  26  31       Calcium 8.9 - 10.3 mg/dL 9.2  9.4  9.4      Total Protein 6.5 - 8.1 g/dL 6.7  6.7    Total Bilirubin 0.0 - 1.2 mg/dL 0.4  0.5    Alkaline Phos 38 - 126 U/L 71  73  70      AST 15 - 41 U/L 18  25  32      ALT 0 - 44 U/L 15  18  18          This result is from an external source.     Lab Results  Component Value Date   TOTALPROTELP 6.7 08/27/2023   TOTALPROTELP 6.7 08/27/2023   ALBUMINELP 3.9 08/27/2023   A1GS 0.2 08/27/2023   A2GS 0.7 08/27/2023   BETS 1.0 08/27/2023   GAMS 0.8 08/27/2023   MSPIKE Not Observed 08/27/2023   SPEI Comment 08/27/2023   Lab Results  Component Value Date   TIBC 382 06/02/2023   TIBC 297 05/21/2022   FERRITIN 22 (L) 06/02/2023   FERRITIN 41 05/21/2022   IRONPCTSAT 23 06/02/2023   IRONPCTSAT 18 05/21/2022   Lab Results  Component Value Date   LDH 139 05/21/2022   LDH 145 12/19/2021    STUDIES:   No results found.    HISTORY:   Past Medical History:  Diagnosis Date   Acute pulmonary embolism without acute cor pulmonale (HCC) 05/01/2022   Anemia 05/20/2022   Chemotherapy follow-up examination 04/21/2023   Degenerative disc disease, cervical 08/27/2023   Depression    mild   Family history of breast cancer 01/22/2021   Family history of prostate cancer 01/22/2021   Genetic testing 02/22/2021   No pathogenic variants detected in [facility] Multi-Cancer +RNA Panel.  Variants of uncertain significance detected in APC (c.681C>G (p.Asp227Glu)), CDKN1C (c.610C>G (p.Pro204Ala)), and FH (c.1151C>T (p.Ala384Val)).  The report date  is February 18, 2021.       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) 12/19/2021   Melanoma of skin (HCC) 12/01/2019   Multiple dysplastic nevi 05/30/2020   Peripheral neuropathy 05/09/2023   Port-A-Cath in place 07/28/2022   Pruritic rash 02/18/2022   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   Right shoulder pain 02/18/2022   Skin cancer    melanoma    Past Surgical History:  Procedure Laterality Date   APPLICATION OF CRANIAL NAVIGATION N/A 01/03/2022   Procedure: APPLICATION OF CRANIAL NAVIGATION;  Surgeon: Lanis Pupa, MD;  Location: [facility] OR;  Service: Neurosurgery;  Laterality: N/A;   CRANIOTOMY Left 01/03/2022   Procedure: Stereotactic Left temporal craniotomy for resection of tumor;  Surgeon: Lanis Pupa, MD;  Location: [facility] OR;  Service: Neurosurgery;  Laterality: Left;   MELANOMA EXCISION WITH SENTINEL LYMPH NODE BIOPSY Right 2021   SEPTOPLASTY  05/13/2023   Dr Honor   SPINAL FUSION W/ LUQUE UNIT ROD  2003    Family History  Problem Relation Age of Onset   Prostate cancer Father        dx 44s   Breast cancer Maternal Aunt        dx after 76; bilateral mastectomy   Stomach cancer Maternal Grandfather        dx 78s   Breast cancer Cousin 49       maternal male cousin; mets    Social History:  reports that he quit smoking about 27 years ago. His smoking use included cigarettes. He has never used smokeless tobacco. He  reports current alcohol use of about 14.0 standard drinks of alcohol per week. He reports that he does not currently use drugs.The patient is alone today.  Allergies:  Allergies  Allergen Reactions   Sulfa Antibiotics Rash   Testosterone  Itching    Gel    Current Medications: Current Outpatient Medications  Medication Sig Dispense Refill   acebutolol  (SECTRAL ) 200 MG capsule TAKE ONE CAPSULE BY MOUTH EVERY DAY 90 capsule 3   acyclovir  (ZOVIRAX ) 400 MG tablet Take 400 mg by mouth 3 (three) times daily.     amitriptyline  (ELAVIL ) 25 MG tablet Take 50 mg by mouth at bedtime.     ergocalciferol (VITAMIN D2) 1.25 MG (50000 UT) capsule Take 50,000 Units by mouth every Wednesday.     folic acid  (FOLVITE ) 1 MG tablet TAKE ONE TABLET BY MOUTH DAILY 30 tablet 5   HYDROcodone -acetaminophen  (NORCO/VICODIN) 5-325 MG tablet Take 1 tablet by mouth every 4 (four) hours as needed for moderate pain (pain score 4-6). 150 tablet 0   hydrOXYzine  (ATARAX ) 25 MG tablet TAKE ONE TABLET BY MOUTH 3 TIMES A DAY AS NEEDED FOR ITCHING 60 tablet 3   lidocaine -prilocaine  (EMLA ) cream Apply 1 Application topically as needed. 30 g 0   lisinopril  (ZESTRIL ) 20 MG tablet Take 1 tablet (20 mg total) by mouth daily. 30 tablet 1   pregabalin  (LYRICA ) 100 MG capsule Take 1 capsule (100 mg total) by mouth 3 (three) times daily. 90 capsule 5   XARELTO  20 MG TABS tablet TAKE ONE TABLET BY MOUTH EVERY DAY WITH SUPPER. START AFTER 3 WEEKS OF 15MG 2 TIMES A DAY 30 tablet 3   No current facility-administered medications for this visit.

## 2024-03-11 LAB — T4: T4, Total: 6.1 ug/dL (ref 4.5–12.0)

## 2024-03-23 ENCOUNTER — Telehealth: Payer: Self-pay

## 2024-03-23 NOTE — Telephone Encounter (Signed)
 Pt called and states he has to have a latera implant procedure done in Dr Renelda office. Dr Honor has requested medical clearance from pt's PCP and Dr Cornelius before he will schedule procedure. Please send clearance letter/form to Dr Renelda office. Pt also asked it be sent to PCP, Therisa Sieve as well.

## 2024-03-24 ENCOUNTER — Encounter: Payer: Self-pay | Admitting: Oncology

## 2024-03-25 ENCOUNTER — Telehealth: Payer: Self-pay

## 2024-03-25 NOTE — Telephone Encounter (Signed)
   Pre-operative Risk Assessment    Patient Name: Xavier Jones  DOB: September 26, 1970 MRN: [id number]   Date of last office visit: 01/06/2024 Date of next office visit: N/A   Request for Surgical Clearance    Procedure:  Repair of nasal valve stenosis  Date of Surgery:  Clearance TBD                                Surgeon:  Dr. Penne Killian Surgeon's Group or Practice Name:  [facility]  Phone number:  [phone] Fax number:  [phone]   Type of Clearance Requested:   - Pharmacy:  Hold Rivaroxaban  (Xarelto ) 3 days prior if cleared   Type of Anesthesia:  General    Additional requests/questions:    Bonney Calvert Pouch   03/25/2024, 10:37 AM

## 2024-03-25 NOTE — Telephone Encounter (Signed)
   Patient Name: Xavier Jones  DOB: Sep 11, 1970 MRN: [id number]  Primary Cardiologist: None  Chart reviewed as part of pre-operative protocol coverage. Pt takes Xarelto  for a history of PE.  This is managed by hematology/oncology.  Recommendations for holding Xarelto  prior to surgery should come from primary managing provider.  I will route this recommendation to the requesting party via Epic fax function and remove from pre-op pool.  Please call with questions.  Damien JAYSON Braver, NP 03/25/2024, 2:04 PM

## 2024-03-28 DIAGNOSIS — M79672 Pain in left foot: Secondary | ICD-10-CM | POA: Diagnosis not present

## 2024-03-28 DIAGNOSIS — Z6825 Body mass index (BMI) 25.0-25.9, adult: Secondary | ICD-10-CM | POA: Diagnosis not present

## 2024-03-28 DIAGNOSIS — Z01818 Encounter for other preprocedural examination: Secondary | ICD-10-CM | POA: Diagnosis not present

## 2024-03-29 DIAGNOSIS — M79672 Pain in left foot: Secondary | ICD-10-CM | POA: Diagnosis not present

## 2024-03-31 ENCOUNTER — Other Ambulatory Visit: Payer: Self-pay | Admitting: Oncology

## 2024-03-31 DIAGNOSIS — C787 Secondary malignant neoplasm of liver and intrahepatic bile duct: Secondary | ICD-10-CM

## 2024-03-31 DIAGNOSIS — C7931 Secondary malignant neoplasm of brain: Secondary | ICD-10-CM

## 2024-03-31 DIAGNOSIS — C7800 Secondary malignant neoplasm of unspecified lung: Secondary | ICD-10-CM

## 2024-03-31 DIAGNOSIS — C439 Malignant melanoma of skin, unspecified: Secondary | ICD-10-CM

## 2024-04-05 ENCOUNTER — Other Ambulatory Visit: Payer: Self-pay | Admitting: Hematology and Oncology

## 2024-04-05 DIAGNOSIS — G629 Polyneuropathy, unspecified: Secondary | ICD-10-CM

## 2024-04-05 DIAGNOSIS — M5126 Other intervertebral disc displacement, lumbar region: Secondary | ICD-10-CM

## 2024-04-05 DIAGNOSIS — M25552 Pain in left hip: Secondary | ICD-10-CM

## 2024-04-05 DIAGNOSIS — M79604 Pain in right leg: Secondary | ICD-10-CM

## 2024-04-06 ENCOUNTER — Encounter: Payer: Self-pay | Admitting: Oncology

## 2024-04-07 ENCOUNTER — Encounter: Payer: Self-pay | Admitting: Oncology

## 2024-04-07 ENCOUNTER — Inpatient Hospital Stay (HOSPITAL_BASED_OUTPATIENT_CLINIC_OR_DEPARTMENT_OTHER): Admitting: Oncology

## 2024-04-07 ENCOUNTER — Inpatient Hospital Stay: Attending: Hematology and Oncology

## 2024-04-07 ENCOUNTER — Inpatient Hospital Stay

## 2024-04-07 VITALS — BP 109/80 | HR 61 | Temp 98.1°F | Resp 16 | Ht 69.5 in | Wt 172.9 lb

## 2024-04-07 DIAGNOSIS — C7931 Secondary malignant neoplasm of brain: Secondary | ICD-10-CM | POA: Diagnosis not present

## 2024-04-07 DIAGNOSIS — C7800 Secondary malignant neoplasm of unspecified lung: Secondary | ICD-10-CM

## 2024-04-07 DIAGNOSIS — C4361 Malignant melanoma of right upper limb, including shoulder: Secondary | ICD-10-CM | POA: Insufficient documentation

## 2024-04-07 DIAGNOSIS — Z87891 Personal history of nicotine dependence: Secondary | ICD-10-CM | POA: Diagnosis not present

## 2024-04-07 DIAGNOSIS — Z79620 Long term (current) use of immunosuppressive biologic: Secondary | ICD-10-CM | POA: Diagnosis not present

## 2024-04-07 DIAGNOSIS — C787 Secondary malignant neoplasm of liver and intrahepatic bile duct: Secondary | ICD-10-CM

## 2024-04-07 DIAGNOSIS — C439 Malignant melanoma of skin, unspecified: Secondary | ICD-10-CM | POA: Diagnosis not present

## 2024-04-07 DIAGNOSIS — Z5112 Encounter for antineoplastic immunotherapy: Secondary | ICD-10-CM | POA: Insufficient documentation

## 2024-04-07 DIAGNOSIS — Z23 Encounter for immunization: Secondary | ICD-10-CM | POA: Insufficient documentation

## 2024-04-07 LAB — CBC WITH DIFFERENTIAL (CANCER CENTER ONLY)
Abs Immature Granulocytes: 0.01 K/uL (ref 0.00–0.07)
Basophils Absolute: 0 K/uL (ref 0.0–0.1)
Basophils Relative: 1 %
Eosinophils Absolute: 0.1 K/uL (ref 0.0–0.5)
Eosinophils Relative: 3 %
HCT: 37.7 % — ABNORMAL LOW (ref 39.0–52.0)
Hemoglobin: 12.6 g/dL — ABNORMAL LOW (ref 13.0–17.0)
Immature Granulocytes: 0 %
Lymphocytes Relative: 30 %
Lymphs Abs: 1.4 K/uL (ref 0.7–4.0)
MCH: 31.8 pg (ref 26.0–34.0)
MCHC: 33.4 g/dL (ref 30.0–36.0)
MCV: 95.2 fL (ref 80.0–100.0)
Monocytes Absolute: 0.5 K/uL (ref 0.1–1.0)
Monocytes Relative: 10 %
Neutro Abs: 2.6 K/uL (ref 1.7–7.7)
Neutrophils Relative %: 56 %
Platelet Count: 249 K/uL (ref 150–400)
RBC: 3.96 MIL/uL — ABNORMAL LOW (ref 4.22–5.81)
RDW: 11.8 % (ref 11.5–15.5)
WBC Count: 4.6 K/uL (ref 4.0–10.5)
nRBC: 0 % (ref 0.0–0.2)

## 2024-04-07 LAB — CMP (CANCER CENTER ONLY)
ALT: 11 U/L (ref 0–44)
AST: 20 U/L (ref 15–41)
Albumin: 4.1 g/dL (ref 3.5–5.0)
Alkaline Phosphatase: 73 U/L (ref 38–126)
Anion gap: 11 (ref 5–15)
BUN: 20 mg/dL (ref 6–20)
CO2: 26 mmol/L (ref 22–32)
Calcium: 9.4 mg/dL (ref 8.9–10.3)
Chloride: 104 mmol/L (ref 98–111)
Creatinine: 0.88 mg/dL (ref 0.61–1.24)
GFR, Estimated: >60 mL/min (ref >60)
Glucose, Bld: 98 mg/dL (ref 70–99)
Potassium: 4.4 mmol/L (ref 3.5–5.1)
Sodium: 141 mmol/L (ref 135–145)
Total Bilirubin: 0.4 mg/dL (ref 0.0–1.2)
Total Protein: 6.9 g/dL (ref 6.5–8.1)

## 2024-04-07 LAB — TSH: TSH: 1.31 u[IU]/mL (ref 0.350–4.500)

## 2024-04-07 MED ORDER — SODIUM CHLORIDE 0.9 % IV SOLN: Freq: Once | INTRAVENOUS | Status: AC

## 2024-04-07 MED ORDER — SODIUM CHLORIDE 0.9 % IV SOLN
480.0000 mg | Freq: Once | INTRAVENOUS | Status: AC
  Administered 2024-04-07: 480 mg via INTRAVENOUS
  Filled 2024-04-07: qty 48

## 2024-04-07 NOTE — Progress Notes (Signed)
 [facility]  [address] 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  There was still a question as to whether this represented a metastatic lesion, rather than a primary, based on the presence of 2 nodules and no epidermal involvement.  MRI head and CT chest, abdomen and pelvis in June 2021 were negative for metastatic disease.   No adjuvant therapy was recommended.  PET scan in September was negative for metastatic disease.  In June 2023, he was found to have widespread metastatic disease, including to the brain, for which he continues nivolumab  every 4 weeks.   Malignant Xavier metastatic to lung [facility]) Metastatic Xavier to lung diagnosed in found on routine CT imaging in May 2023.  There was over 20 bilateral pulmonary nodules which increased in size by the time treatment was started. The largest 1 nodule is up to 2.3 cm in diameter.  There were also 4 subcutaneous nodules of the back.  Soft tissue needle core biopsy of a right back subcutaneous nodule revealed metastatic malignant Xavier.  He was initially treated with 4 cycles of ipilimumab /nivolumab  with a good response. He has continued maintenance nivolumab .  Most recent CT imaging in July 2025 revealed a stable subpleural nodule of the left lower lobe, otherwise no metastatic disease was seen.   Malignant Xavier metastatic to brain [facility]) Brain metastasis diagnosed in June 2023.  PET scan was concerning for possible lesion in the left frontoparietal lobe.  MRI of the brain confirmed a 2.6 cm heterogeneous lesion of the left superior temporal gyrus with mild enhancement and extensive surrounding edema.  He also has an 8 mm cortical lesion of the  inferomedial right frontal lobe with mild edema, a 2 mm mildly enhancing cortical lesion of the inferolateral left frontal lobe and a intrinsically T1 hyperintense lesion of the left lateral pons measuring 7 mm with surrounding edema.  He had 4 lesions in total, but 3 were less than 1 cm.  He was treated with surgical resection of the larger lesion and stereotactic radiation. He had recurrent neurologic symptoms with left-sided facial droop and left-sided weakness in September 2023 and was placed on dexamethasone  4 mg 3 times daily.  Brain revealed thin linear enhancement along the resection margin of the left temporal resection cavity, which may be postoperative, with additional small areas of somewhat more nodular enhancement.  Interval increase in the size of the right inferior frontal gyrus enhancing lesion with increased surrounding edema, mild local mass effect, and mild local midline shift. Stable to slightly decreased size of the left pontine lesion with decreased associated edema. Previously noted left inferior frontal gyrus lesion is no longer visualized. [name] slowly weaned him off dexamethasone .  MRI brain in December 2023 was stable.  He has had persistent left facial weakness.   MRI brain in March 2024 revealed increased size of a hemorrhagic left pons/middle cerebellar peduncle lesion with increased surrounding edema, similar versus slightly decreased size of a 13 x 10 mm enhancing lesion in the medial right frontal lobe, and mildly diminished intrinsic T1 hyperintensity of the left temporal resection site with linear enhancement along the lateral margin, probably postsurgical/post radiation.  PET brain in April did not show hyperactivity, but more  radiation necrosis, so these changes are more consistent with posttreatment changes.  MRI head in August revealed new punctate superior right frontal lobe enhancement compatible with interval metastasis, interval regression of enhancement and edema  in the left brachium pontis and pons. There was also a vasogenic edema at a pre-existing right frontal metastasis with underlying lesion enhancement remaining stable.   In December 2024 a repeat MRI brain looked good and the punctate lesion seen on the last scan has resolved. MRI brain in April 2025 revealed continued stable treated metastases of the left brainstem, right inferior frontal gyrus, left temporal lobe resection site. Additional punctate enhancement in the central left cerebellum, intermittently visible over this series of exams, and associated with a rounded 8 mm area of FLAIR hyperintensity were stable since August 2024, although larger than in 2023. This is most likely an additional tiny treated metastasis. No new metastatic disease identified. He is scheduled to see [name] again in October with repeat MRI brain.  Plan: He feels well but does complain of arthritic pain and stiffness in his hands and right leg pain rated 4/10. He also notes some symptoms of light-headedness and dizziness which he attributes to inner ear issues. He did not notice these new symptoms after recent medication dosage changes. I recommend that he visit an audiologist at a hearing clinic to investigate this. He remains on iron and folate supplements, hydrocodone , Lyrica , and Xarelto  without difficulty. Upon physical exam, I noticed skin discoloration consistent with immunotherapy-induced vitiligo. He asks about Ivermectin and I told him there were no evidence-based trials to show whether this is effective at this time. He has a WBC of 4.6, hemoglobin of 12.6, and platelet count of 249,000. His CMP is completely normal. His TSH and T4 are pending. He is scheduled for day 1, cycle 24 of nivolumab . He has a brain MRI scheduled for 04/21/2024 at [facility]. He will see [name] the following week. I will see him back in 4 weeks with CBC and CMP. The patient understands the plans discussed today and is in agreement  with them.  He knows to contact our office if he develops concerns prior to his next appointment.  I provided 35 minutes of face-to-face time during this encounter and > 50% was spent counseling as documented under my assessment and plan.    Xavier VEAR Cornish, Xavier Jones  [facility] DE[facility] OF [facility] [address] [place] [address] Dept: [phone] Dept Fax: [phone]   No orders of the defined types were placed in this encounter.     CHIEF COMPLAINT:  CC: Metastatic Xavier  Current Treatment: Maintenance nivolumab  every 4 weeks  HISTORY OF PRESENT ILLNESS:   Oncology History  Xavier of skin (HCC)  12/01/2019 Initial Diagnosis   Xavier of skin (HCC)   12/05/2019 Cancer Staging   Staging form: Xavier of the Skin, AJCC 8th Edition - Clinical stage from 12/05/2019: Stage IIA (cT3a, cN0, cM0) - Signed by Jones Xavier VEAR, Xavier Jones on 05/30/2020   02/18/2021 Genetic Testing   No pathogenic variants detected in [facility] Multi-Cancer +RNA Panel.  Variants of uncertain significance detected in APC (c.681C>G (p.Asp227Glu)), CDKN1C (c.610C>G (p.Pro204Ala)), and FH (c.1151C>T (p.Ala384Val)).  The report date is February 18, 2021.    The Multi-Cancer + RNA Panel offered by [facility] includes sequencing and/or deletion/duplication analysis of the following 84 genes:  AIP*, ALK, APC*, ATM*, AXIN2*, BAP1*, BARD1*, BLM*, BMPR1A*, BRCA1*, BRCA2*, BRIP1*,  CASR, CDC73*, CDH1*, CDK4, CDKN1B*, CDKN1C*, CDKN2A, CEBPA, CHEK2*, CTNNA1*, DICER1*, DIS3L2*, EGFR, EPCAM, FH*, FLCN*, GATA2*, GPC3, GREM1, HOXB13, HRAS, KIT, MAX*, MEN1*, MET, MITF, MLH1*, MSH2*, MSH3*, MSH6*, MUTYH*, NBN*, NF1*, NF2*, NTHL1*, PALB2*, PDGFRA, PHOX2B, PMS2*, POLD1*, POLE*, POT1*, PRKAR1A*, PTCH1*, PTEN*, RAD50*, RAD51C*, RAD51D*, RB1*, RECQL4, RET, RUNX1*, SDHA*, SDHAF2*, SDHB*, SDHC*, SDHD*, SMAD4*, SMARCA4*, SMARCB1*, SMARCE1*, STK11*, SUFU*, TERC, TERT, TMEM127*,  Tp53*, TSC1*, TSC2*, VHL*, WRN*, and WT1.  RNA analysis is performed for * genes.   01/30/2022 - 02/20/2022 Chemotherapy   Patient is on Treatment Plan : Xavier Nivolumab  + Ipilimumab  (1/3) q21d / Nivolumab  q28d     01/30/2022 -  Chemotherapy   Patient is on Treatment Plan : Xavier Nivolumab  (480) q28d     Malignant Xavier metastatic to brain (HCC)  12/19/2021 Initial Diagnosis   Malignant Xavier metastatic to brain (HCC)   01/30/2022 - 02/20/2022 Chemotherapy   Patient is on Treatment Plan : Xavier Nivolumab  + Ipilimumab  (1/3) q21d / Nivolumab  q28d     01/30/2022 -  Chemotherapy   Patient is on Treatment Plan : Xavier Nivolumab  (480) q28d     Malignant Xavier metastatic to lung (HCC)  12/19/2021 Initial Diagnosis   Malignant Xavier metastatic to lung (HCC)   01/30/2022 - 02/20/2022 Chemotherapy   Patient is on Treatment Plan : Xavier Nivolumab  + Ipilimumab  (1/3) q21d / Nivolumab  q28d     01/30/2022 -  Chemotherapy   Patient is on Treatment Plan : Xavier Nivolumab  (480) q28d     03/24/2022 Imaging   CTA chest:  IMPRESSION:  1. Small nonocclusive segmental level pulmonary embolism in the  inferior LEFT upper lobe.  2. Marked bronchial wall thickening with areas of patchy basilar  opacities and new ground-glass opacities in the upper lobes.  Constellation of findings may be infectious bronchiolitis/bronchitis  though the possibility of drug related changes in the setting of  immunotherapy are also considered. Furthermore, given basilar  predominance and some material in basilar bronchial structures would  correlate with any risk factors for or history of aspiration.  3. Response to therapy of bilateral metastatic lesions. Some lymph  nodes in the chest with interval increase in size are equivocal at  this time, potentially reactive, but warrant attention on subsequent  imaging.    07/22/2022 Imaging   CT chest, abdomen and pelvis:  IMPRESSION:  1. Interval  positive response to therapy. Resolved subcarinal and  bilateral hilar lymphadenopathy. Stable to decreased left pulmonary  nodules. No new or progressive metastatic disease in the chest,  abdomen or pelvis.  2. One vessel coronary atherosclerosis.  3. Aortic Atherosclerosis (ICD10-I70.0).    Malignant neoplasm metastatic to liver (HCC)  12/19/2021 Initial Diagnosis   Malignant neoplasm metastatic to liver (HCC)   01/30/2022 - 02/20/2022 Chemotherapy   Patient is on Treatment Plan : Xavier Nivolumab  + Ipilimumab  (1/3) q21d / Nivolumab  q28d     01/30/2022 -  Chemotherapy   Patient is on Treatment Plan : Xavier Nivolumab  (480) q28d     07/22/2022 Imaging   CT chest, abdomen and pelvis:  IMPRESSION:  1. Interval positive response to therapy. Resolved subcarinal and  bilateral hilar lymphadenopathy. Stable to decreased left pulmonary  nodules. No new or progressive metastatic disease in the chest,  abdomen or pelvis.  2. One vessel coronary atherosclerosis.  3. Aortic Atherosclerosis (ICD10-I70.0).        INTERVAL HISTORY:  Xavier Jones is here today for repeat clinical assessment of his metastatic malignant Xavier prior to nivolumab .  Patient  states that he feels well but does complain of arthritic pain and stiffness in his hands and right leg pain rated 4/10. He also notes some symptoms of light-headedness and dizziness which he attributes to inner ear issues. He did not notice these new symptoms after recent medication dosage changes. I recommend that he visit an audiologist at a hearing clinic to investigate this. He remains on iron and folate supplements, hydrocodone , Lyrica , and Xarelto  without difficulty. Upon physical exam, I noticed skin discoloration consistent with immunotherapy-induced vitiligo. He asks about Ivermectin and I told him there were no evidence-based trials to show whether this is effective at this time. He has a WBC of 4.6, hemoglobin of 12.6, and platelet count of  249,000. His CMP is completely normal. His TSH and T4 are pending. He is scheduled for day 1, cycle 24 of nivolumab . He has a brain MRI scheduled for 04/21/2024 at [facility]. He will see [name] the following week. I will see him back in 4 weeks with CBC and CMP.  He denies fever, chills, night sweats, or other signs of infection. He denies cardiorespiratory and gastrointestinal issues. He  denies pain. His appetite is good and His weight has decreased 3 pounds over last 4 weeks.  REVIEW OF SYSTEMS:   Review of Systems  Constitutional:  Negative for appetite change, chills, fatigue, fever and unexpected weight change.  HENT:   Negative for lump/mass, mouth sores and sore throat.   Respiratory:  Negative for cough and shortness of breath.   Cardiovascular:  Negative for chest pain and leg swelling.  Gastrointestinal:  Negative for abdominal pain, constipation, diarrhea, nausea and vomiting.  Genitourinary:  Negative for difficulty urinating, dysuria, frequency and hematuria.   Musculoskeletal:  Positive for arthralgias (hands). Negative for back pain, myalgias and neck pain.       Right leg pain 4/10  Skin:  Negative for itching, rash and wound.  Neurological:  Positive for dizziness and light-headedness. Negative for extremity weakness, headaches and numbness.  Hematological:  Negative for adenopathy. Does not bruise/bleed easily.  Psychiatric/Behavioral:  Negative for depression and sleep disturbance. The patient is not nervous/anxious.      VITALS:   Blood pressure 109/80, pulse 61, temperature 98.1 F (36.7 C), temperature source Oral, resp. rate 16, height 5' 9.5 (1.765 m), weight 172 lb 14.4 oz (78.4 kg), SpO2 100%.  Wt Readings from Last 3 Encounters:  04/07/24 172 lb 14.4 oz (78.4 kg)  03/10/24 175 lb (79.4 kg)  02/11/24 176 lb (79.8 kg)    Body mass index is 25.17 kg/m.  Performance status (ECOG): 1 - Symptomatic but completely ambulatory    PHYSICAL EXAM:    Physical Exam Vitals and nursing note reviewed.  Constitutional:      General: He is not in acute distress.    Appearance: Normal appearance. He is normal weight.  HENT:     Head: Normocephalic and atraumatic.     Mouth/Throat:     Mouth: Mucous membranes are moist.     Pharynx: Oropharynx is clear. No oropharyngeal exudate or posterior oropharyngeal erythema.  Eyes:     General: No scleral icterus.    Extraocular Movements: Extraocular movements intact.     Conjunctiva/sclera: Conjunctivae normal.     Pupils: Pupils are equal, round, and reactive to light.  Cardiovascular:     Rate and Rhythm: Normal rate and regular rhythm.     Heart sounds: Normal heart sounds. No murmur heard.    No friction rub.  No gallop.  Pulmonary:     Effort: Pulmonary effort is normal.     Breath sounds: Normal breath sounds. No wheezing, rhonchi or rales.  Abdominal:     General: Bowel sounds are normal. There is no distension.     Palpations: Abdomen is soft. There is no hepatomegaly, splenomegaly or mass.     Tenderness: There is no abdominal tenderness.  Musculoskeletal:        General: Normal range of motion.     Cervical back: Normal range of motion and neck supple. No tenderness.     Right lower leg: No edema.     Left lower leg: No edema.  Lymphadenopathy:     Cervical: No cervical adenopathy.     Upper Body:     Right upper body: No supraclavicular or axillary adenopathy.     Left upper body: No supraclavicular or axillary adenopathy.     Lower Body: No right inguinal adenopathy. No left inguinal adenopathy.  Skin:    General: Skin is warm and dry.     Coloration: Skin is not jaundiced.     Findings: No rash.     Comments: No new subcutaneous masses or suspicious skin lesions. Vitiligo of all extremities and neck.  Neurological:     Mental Status: He is alert and oriented to person, place, and time.     Cranial Nerves: No cranial nerve deficit.     Comments: Left facial droop   Psychiatric:        Mood and Affect: Mood normal.        Behavior: Behavior normal.        Thought Content: Thought content normal.     LABS:      Latest Ref Rng & Units 04/07/2024    9:01 AM 03/10/2024    9:00 AM 02/11/2024    9:03 AM  CBC  WBC 4.0 - 10.5 K/uL 4.6  3.9  4.2   Hemoglobin 13.0 - 17.0 g/dL 87.3  87.8  88.3   Hematocrit 39.0 - 52.0 % 37.7  37.1  34.5   Platelets 150 - 400 K/uL 249  263  278       Latest Ref Rng & Units 04/07/2024    9:01 AM 03/10/2024    9:00 AM 02/11/2024    9:03 AM  CMP  Glucose 70 - 99 mg/dL 98  887  898   BUN 6 - 20 mg/dL 20  18  19    Creatinine 0.61 - 1.24 mg/dL 9.11  9.00  9.15   Sodium 135 - 145 mmol/L 141  145  141   Potassium 3.5 - 5.1 mmol/L 4.4  4.5  4.5   Chloride 98 - 111 mmol/L 104  109  107   CO2 22 - 32 mmol/L 26  25  26    Calcium 8.9 - 10.3 mg/dL 9.4  9.2  9.4   Total Protein 6.5 - 8.1 g/dL 6.9  6.7  6.7   Total Bilirubin 0.0 - 1.2 mg/dL 0.4  0.4  0.5   Alkaline Phos 38 - 126 U/L 73  71  73   AST 15 - 41 U/L 20  18  25    ALT 0 - 44 U/L 11  15  18       Lab Results  Component Value Date   TOTALPROTELP 6.7 08/27/2023   TOTALPROTELP 6.7 08/27/2023   ALBUMINELP 3.9 08/27/2023   A1GS 0.2 08/27/2023   A2GS 0.7 08/27/2023   BETS 1.0 08/27/2023  GAMS 0.8 08/27/2023   MSPIKE Not Observed 08/27/2023   SPEI Comment 08/27/2023   Lab Results  Component Value Date   TIBC 382 06/02/2023   TIBC 297 05/21/2022   FERRITIN 22 (L) 06/02/2023   FERRITIN 41 05/21/2022   IRONPCTSAT 23 06/02/2023   IRONPCTSAT 18 05/21/2022   Lab Results  Component Value Date   LDH 139 05/21/2022   LDH 145 12/19/2021    STUDIES:   No results found.    HISTORY:   Past Medical History:  Diagnosis Date   Acute pulmonary embolism without acute cor pulmonale (HCC) 05/01/2022   Anemia 05/20/2022   Chemotherapy follow-up examination 04/21/2023   Degenerative disc disease, cervical 08/27/2023   Depression    mild   Family history of breast  cancer 01/22/2021   Family history of prostate cancer 01/22/2021   Genetic testing 02/22/2021   No pathogenic variants detected in [facility] Multi-Cancer +RNA Panel.  Variants of uncertain significance detected in APC (c.681C>G (p.Asp227Glu)), CDKN1C (c.610C>G (p.Pro204Ala)), and FH (c.1151C>T (p.Ala384Val)).  The report date is February 18, 2021.       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) 12/19/2021   Xavier of skin (HCC) 12/01/2019   Multiple dysplastic nevi 05/30/2020   Peripheral neuropathy 05/09/2023   Port-A-Cath in place 07/28/2022   Pruritic rash 02/18/2022   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   Right shoulder pain 02/18/2022   Skin cancer    Xavier    Past Surgical History:  Procedure Laterality Date   APPLICATION OF CRANIAL NAVIGATION N/A 01/03/2022   Procedure: APPLICATION OF CRANIAL NAVIGATION;  Surgeon: Lanis Pupa, Xavier Jones;  Location: [facility] OR;  Service: Neurosurgery;  Laterality: N/A;   CRANIOTOMY Left 01/03/2022   Procedure: Stereotactic Left temporal craniotomy for resection of tumor;  Surgeon: Lanis Pupa, Xavier Jones;  Location[facility];  Service: Neurosurgery;  Laterality: Left;   Xavier EXCISION WITH SENTINEL LYMPH NODE BIOPSY Right 2021   SEPTOPLASTY  05/13/2023   Dr Honor   SPINAL FUSION W/ LUQUE UNIT ROD  2003    Family History  Problem Relation Age of Onset   Prostate cancer Father        dx 36s   Breast  cancer Maternal Aunt        dx after 39; bilateral mastectomy   Stomach cancer Maternal Grandfather        dx 86s   Breast cancer Cousin 33       maternal male cousin; mets    Social History:  reports that he quit smoking about 27 years ago. His smoking use included cigarettes. He has never used smokeless tobacco. He reports current alcohol use of about 14.0 standard drinks of alcohol per week. He reports that he does not currently use drugs.The patient is alone today.  Allergies:  Allergies  Allergen Reactions   Sulfa Antibiotics Rash   Testosterone  Itching    Gel    Current Medications: Current Outpatient Medications  Medication Sig Dispense Refill  acebutolol  (SECTRAL ) 200 MG capsule TAKE ONE CAPSULE BY MOUTH EVERY DAY 90 capsule 3   acyclovir  (ZOVIRAX ) 400 MG tablet Take 400 mg by mouth 3 (three) times daily.     amitriptyline  (ELAVIL ) 25 MG tablet Take 50 mg by mouth at bedtime.     ergocalciferol (VITAMIN D2) 1.25 MG (50000 UT) capsule Take 50,000 Units by mouth every Wednesday.     folic acid  (FOLVITE ) 1 MG tablet TAKE ONE TABLET BY MOUTH DAILY 30 tablet 5   HYDROcodone -acetaminophen  (NORCO/VICODIN) 5-325 MG tablet Take 1 tablet by mouth every 4 (four) hours as needed for moderate pain (pain score 4-6). 150 tablet 0   hydrOXYzine  (ATARAX ) 25 MG tablet TAKE ONE TABLET BY MOUTH 3 TIMES A DAY AS NEEDED FOR ITCHING 60 tablet 3   lidocaine -prilocaine  (EMLA ) cream Apply 1 Application topically as needed. 30 g 0   lisinopril  (ZESTRIL ) 20 MG tablet Take 1 tablet (20 mg total) by mouth daily. 30 tablet 1   pregabalin  (LYRICA ) 100 MG capsule Take 1 capsule (100 mg total) by mouth 3 (three) times daily. 90 capsule 5   XARELTO  20 MG TABS tablet TAKE ONE TABLET BY MOUTH EVERY DAY WITH SUPPER. START AFTER 3 WEEKS OF 15MG 2 TIMES A DAY 30 tablet 3   No current facility-administered medications for this visit.   I,Berdia Lachman H Charmayne Odell,acting as a scribe for Xavier VEAR Cornish, Xavier Jones.,have  documented all relevant documentation on the behalf of Xavier VEAR Cornish, Xavier Jones,as directed by  Xavier VEAR Cornish, Xavier Jones while in the presence of Xavier VEAR Cornish, Xavier Jones.  I have reviewed this report as typed by the medical scribe, and it is complete and accurate

## 2024-04-07 NOTE — Patient Instructions (Signed)
 Nivolumab Injection What is this medication? NIVOLUMAB (nye VOL ue mab) treats some types of cancer. It works by helping your immune system slow or stop the spread of cancer cells. It is a monoclonal antibody. This medicine may be used for other purposes; ask your health care provider or pharmacist if you have questions. COMMON BRAND NAME(S): Opdivo What should I tell my care team before I take this medication? They need to know if you have any of these conditions: Allogeneic stem cell transplant (uses someone else's stem cells) Autoimmune diseases, such as Crohn disease, ulcerative colitis, lupus History of chest radiation Nervous system problems, such as Guillain-Barre syndrome or myasthenia gravis Organ transplant An unusual or allergic reaction to nivolumab, other medications, foods, dyes, or preservatives Pregnant or trying to get pregnant Breast-feeding How should I use this medication? This medication is infused into a vein. It is given in a hospital or clinic setting. A special MedGuide will be given to you before each treatment. Be sure to read this information carefully each time. Talk to your care team about the use of this medication in children. While it may be prescribed for children as young as 12 years for selected conditions, precautions do apply. Overdosage: If you think you have taken too much of this medicine contact a poison control center or emergency room at once. NOTE: This medicine is only for you. Do not share this medicine with others. What if I miss a dose? Keep appointments for follow-up doses. It is important not to miss your dose. Call your care team if you are unable to keep an appointment. What may interact with this medication? Interactions have not been studied. This list may not describe all possible interactions. Give your health care provider a list of all the medicines, herbs, non-prescription drugs, or dietary supplements you use. Also tell them if you  smoke, drink alcohol, or use illegal drugs. Some items may interact with your medicine. What should I watch for while using this medication? Your condition will be monitored carefully while you are receiving this medication. You may need blood work while taking this medication. This medication may cause serious skin reactions. They can happen weeks to months after starting the medication. Contact your care team right away if you notice fevers or flu-like symptoms with a rash. The rash may be red or purple and then turn into blisters or peeling of the skin. You may also notice a red rash with swelling of the face, lips, or lymph nodes in your neck or under your arms. Tell your care team right away if you have any change in your eyesight. Talk to your care team if you are pregnant or think you might be pregnant. A negative pregnancy test is required before starting this medication. A reliable form of contraception is recommended while taking this medication and for 5 months after the last dose. Talk to your care team about effective forms of contraception. Do not breast-feed while taking this medication and for 5 months after the last dose. What side effects may I notice from receiving this medication? Side effects that you should report to your care team as soon as possible: Allergic reactions--skin rash, itching, hives, swelling of the face, lips, tongue, or throat Dry cough, shortness of breath or trouble breathing Eye pain, redness, irritation, or discharge with blurry or decreased vision Heart muscle inflammation--unusual weakness or fatigue, shortness of breath, chest pain, fast or irregular heartbeat, dizziness, swelling of the ankles, feet, or hands Hormone  gland problems--headache, sensitivity to light, unusual weakness or fatigue, dizziness, fast or irregular heartbeat, increased sensitivity to cold or heat, excessive sweating, constipation, hair loss, increased thirst or amount of urine,  tremors or shaking, irritability Infusion reactions--chest pain, shortness of breath or trouble breathing, feeling faint or lightheaded Kidney injury (glomerulonephritis)--decrease in the amount of urine, red or dark brown urine, foamy or bubbly urine, swelling of the ankles, hands, or feet Liver injury--right upper belly pain, loss of appetite, nausea, light-colored stool, dark yellow or brown urine, yellowing skin or eyes, unusual weakness or fatigue Pain, tingling, or numbness in the hands or feet, muscle weakness, change in vision, confusion or trouble speaking, loss of balance or coordination, trouble walking, seizures Rash, fever, and swollen lymph nodes Redness, blistering, peeling, or loosening of the skin, including inside the mouth Sudden or severe stomach pain, bloody diarrhea, fever, nausea, vomiting Side effects that usually do not require medical attention (report these to your care team if they continue or are bothersome): Bone, joint, or muscle pain Diarrhea Fatigue Loss of appetite Nausea Skin rash This list may not describe all possible side effects. Call your doctor for medical advice about side effects. You may report side effects to FDA at 1-800-FDA-1088. Where should I keep my medication? This medication is given in a hospital or clinic. It will not be stored at home. NOTE: This sheet is a summary. It may not cover all possible information. If you have questions about this medicine, talk to your doctor, pharmacist, or health care provider.  2024 Elsevier/Gold Standard (2021-10-21 00:00:00)

## 2024-04-08 ENCOUNTER — Other Ambulatory Visit: Payer: Self-pay

## 2024-04-08 LAB — T4: T4, Total: 6.2 ug/dL (ref 4.5–12.0)

## 2024-04-15 ENCOUNTER — Encounter: Payer: Self-pay | Admitting: Oncology

## 2024-04-21 ENCOUNTER — Other Ambulatory Visit

## 2024-04-22 ENCOUNTER — Telehealth: Payer: Self-pay | Admitting: *Deleted

## 2024-04-22 NOTE — Telephone Encounter (Signed)
 PC to patient, informed him his MD appointment needs to be changed until after his MRI brain on 04/27/24.  Appointment scheduled on 04/28/24 at 9:30.  He verbalizes understanding.

## 2024-04-25 ENCOUNTER — Inpatient Hospital Stay

## 2024-04-25 ENCOUNTER — Inpatient Hospital Stay: Admitting: Internal Medicine

## 2024-04-27 ENCOUNTER — Encounter: Payer: Self-pay | Admitting: Oncology

## 2024-04-27 ENCOUNTER — Other Ambulatory Visit: Payer: Self-pay | Admitting: Hematology and Oncology

## 2024-04-27 ENCOUNTER — Ambulatory Visit
Admission: RE | Admit: 2024-04-27 | Discharge: 2024-04-27 | Disposition: A | Source: Ambulatory Visit | Attending: Internal Medicine

## 2024-04-27 ENCOUNTER — Other Ambulatory Visit: Payer: Self-pay

## 2024-04-27 DIAGNOSIS — C7931 Secondary malignant neoplasm of brain: Secondary | ICD-10-CM

## 2024-04-27 DIAGNOSIS — C801 Malignant (primary) neoplasm, unspecified: Secondary | ICD-10-CM | POA: Diagnosis not present

## 2024-04-27 DIAGNOSIS — I2699 Other pulmonary embolism without acute cor pulmonale: Secondary | ICD-10-CM

## 2024-04-27 MED ORDER — GADOPICLENOL 0.5 MMOL/ML IV SOLN
8.0000 mL | Freq: Once | INTRAVENOUS | Status: AC | PRN
  Administered 2024-04-27: 8 mL via INTRAVENOUS

## 2024-04-28 ENCOUNTER — Inpatient Hospital Stay (HOSPITAL_BASED_OUTPATIENT_CLINIC_OR_DEPARTMENT_OTHER): Admitting: Internal Medicine

## 2024-04-28 VITALS — BP 130/91 | HR 61 | Temp 98.0°F | Resp 16 | Ht 69.5 in | Wt 171.7 lb

## 2024-04-28 DIAGNOSIS — Y842 Radiological procedure and radiotherapy as the cause of abnormal reaction of the patient, or of later complication, without mention of misadventure at the time of the procedure: Secondary | ICD-10-CM | POA: Diagnosis not present

## 2024-04-28 DIAGNOSIS — C7931 Secondary malignant neoplasm of brain: Secondary | ICD-10-CM

## 2024-04-28 DIAGNOSIS — Z5112 Encounter for antineoplastic immunotherapy: Secondary | ICD-10-CM | POA: Diagnosis not present

## 2024-04-28 DIAGNOSIS — C7800 Secondary malignant neoplasm of unspecified lung: Secondary | ICD-10-CM | POA: Diagnosis not present

## 2024-04-28 DIAGNOSIS — Z23 Encounter for immunization: Secondary | ICD-10-CM | POA: Diagnosis not present

## 2024-04-28 DIAGNOSIS — C4361 Malignant melanoma of right upper limb, including shoulder: Secondary | ICD-10-CM | POA: Diagnosis not present

## 2024-04-28 DIAGNOSIS — I6789 Other cerebrovascular disease: Secondary | ICD-10-CM | POA: Diagnosis not present

## 2024-04-28 DIAGNOSIS — C787 Secondary malignant neoplasm of liver and intrahepatic bile duct: Secondary | ICD-10-CM | POA: Diagnosis not present

## 2024-04-28 NOTE — Progress Notes (Signed)
 [facility] [address]  [place], [place] [address] [phone]   Interval Evaluation  Date of Service: 04/28/24 Patient Name: Xavier Jones Patient MRN: [id number] Patient DOB: 07-31-1970 Provider: Arthea MARLA Manns, MD  Identifying Statement:  Xavier Jones is a 53 y.o. 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  Variants of uncertain significance detected in APC (c.681C>G (p.Asp227Glu)), CDKN1C (c.610C>G (p.Pro204Ala)), and FH (c.1151C>T (p.Ala384Val)).  The report date is February 18, 2021.    The Multi-Cancer + RNA Panel offered by [facility] includes sequencing and/or deletion/duplication analysis of the following 84 genes:  AIP*, ALK, APC*, ATM*, AXIN2*, BAP1*, BARD1*, BLM*, BMPR1A*, BRCA1*, BRCA2*, BRIP1*, CASR, CDC73*, CDH1*, CDK4, CDKN1B*, CDKN1C*, CDKN2A, CEBPA, CHEK2*, CTNNA1*, DICER1*, DIS3L2*, EGFR, EPCAM, FH*, FLCN*, GATA2*, GPC3, GREM1, HOXB13, HRAS, KIT, MAX*, MEN1*, MET, MITF, MLH1*, MSH2*, MSH3*, MSH6*, MUTYH*, NBN*, NF1*, NF2*, NTHL1*, PALB2*, PDGFRA, PHOX2B, PMS2*, POLD1*, POLE*, POT1*, PRKAR1A*, PTCH1*, PTEN*, RAD50*, RAD51C*, RAD51D*, RB1*, RECQL4, RET, RUNX1*, SDHA*, SDHAF2*, SDHB*, SDHC*, SDHD*, SMAD4*, SMARCA4*, SMARCB1*, SMARCE1*, STK11*, SUFU*, TERC, TERT, TMEM127*, Tp53*, TSC1*, TSC2*, VHL*, WRN*, and WT1.  RNA analysis is performed for * genes.   01/30/2022 - 02/20/2022 Chemotherapy   Patient is on Treatment Plan : MELANOMA Nivolumab  + Ipilimumab  (1/3)  q21d / Nivolumab  q28d     01/30/2022 -  Chemotherapy   Patient is on Treatment Plan : MELANOMA Nivolumab  (480) q28d     Malignant melanoma metastatic to brain (HCC)  12/19/2021 Initial Diagnosis   Malignant melanoma metastatic to brain (HCC)   01/30/2022 - 02/20/2022 Chemotherapy   Patient is on Treatment Plan : MELANOMA Nivolumab  + Ipilimumab  (1/3) q21d / Nivolumab  q28d     01/30/2022 -  Chemotherapy   Patient is on Treatment Plan : MELANOMA Nivolumab  (480) q28d     Malignant melanoma metastatic to lung (HCC)  12/19/2021 Initial Diagnosis   Malignant melanoma metastatic to lung (HCC)   01/30/2022 - 02/20/2022 Chemotherapy   Patient is on Treatment Plan : MELANOMA Nivolumab  + Ipilimumab  (1/3) q21d / Nivolumab  q28d     01/30/2022 -  Chemotherapy   Patient is on Treatment Plan : MELANOMA Nivolumab  (480) q28d     03/24/2022 Imaging   CTA chest:  IMPRESSION:  1. Small nonocclusive segmental level pulmonary embolism in the  inferior LEFT upper lobe.  2. Marked bronchial wall thickening with areas of patchy basilar  opacities and new ground-glass opacities in the upper lobes.  Constellation of findings may be infectious bronchiolitis/bronchitis  though the possibility of drug related changes in the setting of  immunotherapy are also considered. Furthermore, given basilar  predominance and some material in basilar bronchial structures would  correlate with any risk factors for or history of aspiration.  3. Response to therapy of bilateral metastatic lesions. Some lymph  nodes in the chest with interval increase in size are equivocal at  this time, potentially reactive, but warrant attention on subsequent  imaging.    07/22/2022 Imaging   CT chest, abdomen and pelvis:  IMPRESSION:  1. Interval positive response to therapy. Resolved subcarinal and  bilateral hilar lymphadenopathy. Stable to decreased left pulmonary  nodules. No new or progressive metastatic disease in the chest,   abdomen or pelvis.  2. One vessel coronary atherosclerosis.  3. Aortic Atherosclerosis (ICD10-I70.0).    Malignant neoplasm metastatic to liver (HCC)  12/19/2021 Initial Diagnosis   Malignant neoplasm metastatic to liver (HCC)   01/30/2022 - 02/20/2022 Chemotherapy   Patient is on Treatment Plan : MELANOMA Nivolumab  + Ipilimumab  (1/3) q21d / Nivolumab  q28d     01/30/2022 -  Chemotherapy   Patient is on Treatment Plan : MELANOMA Nivolumab  (480) q28d     07/22/2022 Imaging   CT chest, abdomen and pelvis:  IMPRESSION:  1. Interval positive response to therapy. Resolved subcarinal and  bilateral hilar lymphadenopathy. Stable to decreased left pulmonary  nodules. No new or progressive metastatic disease in the chest,  abdomen or pelvis.  2. One vessel coronary atherosclerosis.  3. Aortic Atherosclerosis (ICD10-I70.0).     CNS Oncologic History 01/02/22: SRS to 4 metastases, including pre-op L temporal Cinda) 01/03/22: Craniotomy, resection L temporal Rosann)  Interval History: Xavier Jones presents for follow up after recent MRI brain.  No new or progressive changes today.  His left sided facial weakness is unchanged for the most part, continues to have some fasciculations as prior.  He remains unable to close his eyelid.  He is using saline drops to keep the eye moist.  No longer dosing decadron .  Neuropathy symptoms are much improved in Lyrica  75mg  TID.  Continues on nivolumab  with Dr. Cornelius, most recently was infused last week.  H+P (03/31/22) Patient presents today after 3 month post-SRS and craniotomy follow up.  He developed left sided weakness, mainly facial droop, 1 week ago.  Denies arm or leg weakness during that time.  He was started on decadron , dosed 4mg  TID x3 days, then down to 4mg  daily thereafter.  His weakness has improved back to near baseline.  He has no other complaints today, no seizures, no issues with gait.  Medications: Current Outpatient Medications on  File Prior to Visit  Medication Sig Dispense Refill   acebutolol  (SECTRAL ) 200 MG capsule TAKE ONE CAPSULE BY MOUTH EVERY DAY 90 capsule 3   acyclovir  (ZOVIRAX ) 400 MG tablet Take 400 mg by mouth 3 (three) times daily.     amitriptyline  (ELAVIL ) 25 MG tablet Take 50 mg by mouth at bedtime.     ergocalciferol (VITAMIN D2) 1.25 MG (50000 UT) capsule Take 50,000 Units by mouth every Wednesday.     folic acid  (FOLVITE ) 1 MG tablet TAKE ONE TABLET BY MOUTH DAILY 30 tablet 5   HYDROcodone -acetaminophen  (NORCO/VICODIN) 5-325 MG tablet Take 1 tablet by mouth every 4 (four) hours as needed for moderate pain (pain score 4-6). 150 tablet 0   hydrOXYzine  (ATARAX ) 25 MG tablet TAKE ONE TABLET BY MOUTH 3 TIMES A DAY AS NEEDED FOR ITCHING 60 tablet 3   lidocaine -prilocaine  (EMLA ) cream Apply 1 Application topically as needed. 30 g 0   lisinopril  (ZESTRIL ) 20 MG tablet Take 1 tablet (20 mg total) by mouth daily. 30 tablet 1   pregabalin  (LYRICA ) 100 MG capsule Take 1 capsule (100 mg total) by mouth 3 (three) times daily. 90 capsule 5   XARELTO  20 MG TABS tablet TAKE ONE TABLET BY MOUTH EVERY DAY WITH SUPPER 30 tablet 3   No current facility-administered medications on file prior to visit.    Allergies:  Allergies  Allergen  Reactions   Sulfa Antibiotics Rash   Testosterone  Itching    Gel   Past Medical History:  Past Medical History:  Diagnosis Date   Acute pulmonary embolism without acute cor pulmonale (HCC) 05/01/2022   Anemia 05/20/2022   Chemotherapy follow-up examination 04/21/2023   Degenerative disc disease, cervical 08/27/2023   Depression    mild   Family history of breast cancer 01/22/2021   Family history of prostate cancer 01/22/2021   Genetic testing 02/22/2021   No pathogenic variants detected in [facility] Multi-Cancer +RNA Panel.  Variants of uncertain significance detected in APC (c.681C>G (p.Asp227Glu)), CDKN1C (c.610C>G (p.Pro204Ala)), and FH (c.1151C>T (p.Ala384Val)).  The report  date is February 18, 2021.       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) 12/19/2021   Melanoma of skin (HCC) 12/01/2019   Multiple dysplastic nevi 05/30/2020   Peripheral neuropathy 05/09/2023   Port-A-Cath in place 07/28/2022   Pruritic rash 02/18/2022   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   Right shoulder pain 02/18/2022   Skin cancer    melanoma   Past Surgical History:  Past Surgical History:  Procedure Laterality Date   APPLICATION OF CRANIAL NAVIGATION N/A 01/03/2022   Procedure: APPLICATION OF CRANIAL NAVIGATION;  Surgeon: Lanis Pupa, MD;  Location: [facility] OR;  Service: Neurosurgery;  Laterality: N/A;   CRANIOTOMY Left 01/03/2022   Procedure: Stereotactic Left temporal craniotomy for resection of tumor;  Surgeon: Lanis Pupa, MD;  Location: [facility] OR;  Service: Neurosurgery;  Laterality: Left;   MELANOMA EXCISION WITH SENTINEL LYMPH NODE BIOPSY Right 2021   SEPTOPLASTY  05/13/2023   Dr Honor   SPINAL FUSION W/ LUQUE UNIT ROD  2003   Social History:  Social History   Socioeconomic History   Marital status: Divorced    Spouse name: Not on file   Number of children: 0   Years of education: Not on file   Highest education level: Not on file  Occupational History   Not on file  Tobacco Use   Smoking status: Former    Current packs/day: 0.00    Types: Cigarettes    Quit date: 1998    Years since quitting: 27.8    Smokeless tobacco: Never  Vaping Use   Vaping status: Never Used  Substance and Sexual Activity   Alcohol use: Yes    Alcohol/week: 14.0 standard drinks of alcohol    Types: 14 Cans of beer per week    Comment: 2-3 beers per evening   Drug use: Not Currently   Sexual activity: Not on file  Other Topics Concern   Not on file  Social History Narrative   Not on file   Social Drivers of Health   Financial Resource Strain: Not on file  Food Insecurity: Low Risk  (02/04/2024)   Received from [facility]   Hunger Vital Sign    Within the past 12 months, you worried that your food would run out before you got money to buy more:  Never true    Within the past 12 months, the food you bought just didn't last and you didn't have money to get more. : Never true  Transportation Needs: No Transportation Needs (02/04/2024)   Received from [facility]    In the past 12 months, has lack of reliable transportation kept you from medical appointments, meetings, work or from getting things needed for daily living? : No  Physical Activity: Not on file  Stress: Not on file  Social Connections: Not on file  Intimate Partner Violence: Not on file   Family History:  Family History  Problem Relation Age of Onset   Prostate cancer Father        dx 79s   Breast cancer Maternal Aunt        dx after 73; bilateral mastectomy   Stomach cancer Maternal Grandfather        dx 48s   Breast cancer Cousin 45       maternal male cousin; mets    Review of Systems: Constitutional: Doesn't report fevers, chills or abnormal weight loss Eyes: Doesn't report blurriness of vision Ears, nose, mouth, throat, and face: Doesn't report sore throat Respiratory: Doesn't report cough, dyspnea or wheezes Cardiovascular: Doesn't report palpitation, chest discomfort  Gastrointestinal:  Doesn't report nausea, constipation, diarrhea GU: Doesn't report incontinence Skin: Doesn't report skin  rashes Neurological: Per HPI Musculoskeletal: Doesn't report joint pain Behavioral/Psych: Doesn't report anxiety  Physical Exam: Vitals:   04/28/24 0930  BP: (!) 130/91  Pulse: 61  Resp: 16  Temp: 98 F (36.7 C)  SpO2: 98%    KPS: 80. General: Alert, cooperative, pleasant, in no acute distress Head: Normal EENT: No conjunctival injection or scleral icterus.  Lungs: Resp effort normal Cardiac: Regular rate Abdomen: Non-distended abdomen Skin: No rashes cyanosis or petechiae. Extremities: No clubbing or edema  Neurologic Exam: Mental Status: Awake, alert, attentive to examiner. Oriented to self and environment. Language is fluent with intact comprehension.  Cranial Nerves: Visual acuity is grossly normal. Visual fields are full. Extra-ocular movements intact. L LMN facial paresis, dense Motor: Tone and bulk are normal. Power is full in both arms and legs. Reflexes are symmetric, no pathologic reflexes present.  Sensory: Intact to light touch Gait: Normal.   Labs: I have reviewed the data as listed    Component Value Date/Time   NA 141 04/07/2024 0901   NA 137 01/13/2024 0000   K 4.4 04/07/2024 0901   CL 104 04/07/2024 0901   CO2 26 04/07/2024 0901   GLUCOSE 98 04/07/2024 0901   BUN 20 04/07/2024 0901   BUN 21 01/13/2024 0000   CREATININE 0.88 04/07/2024 0901   CALCIUM 9.4 04/07/2024 0901   PROT 6.9 04/07/2024 0901   PROT 6.2 (A) 05/08/2022 0000   ALBUMIN 4.1 04/07/2024 0901   AST 20 04/07/2024 0901   ALT 11 04/07/2024 0901   ALKPHOS 73 04/07/2024 0901   BILITOT 0.4 04/07/2024 0901   GFRNONAA >60 04/07/2024 0901   Lab Results  Component Value Date   WBC 4.6 04/07/2024   NEUTROABS 2.6 04/07/2024   HGB 12.6 (L) 04/07/2024   HCT 37.7 (L) 04/07/2024   MCV 95.2 04/07/2024   PLT 249 04/07/2024    Imaging:  MR BRAIN W WO CONTRAST Result Date: 04/27/2024 EXAM: MRI BRAIN WITH AND WITHOUT CONTRAST 04/27/2024 11:52:34 AM TECHNIQUE: Multiplanar multisequence  MRI of the head/brain was performed with and without the administration of 8 ml Vueway  intravenous contrast. COMPARISON: MRI  head 10/23/2023. CLINICAL HISTORY: Brain/CNS neoplasm, assess treatment response. RE STAGING SRS ; Mri brain w wo; 8 ml vueway ; 10 ml vial; 2 ml wasted; Hx metastatic melanoma, brain metastases treated with combined resection and SRS. Restaging. FINDINGS: BRAIN AND VENTRICLES: No acute infarct. No acute intracranial hemorrhage. No midline shift. Slight gyral expansion is noted in the right frontal lobe associated with the enhancing lesion. The basilar cisterns are patent. Normal appearance of the ventricles. No hydrocephalus. The sella is unremarkable. Normal flow voids. There is an intrinsically T1 hyperintense lesion in the left aspect of the pons at the level of the middle cerebellar peduncle which measures approximately 1.1 x 0.9 cm (previously 1.2 x 1.0 cm on the 10/23/2023 study). The lesion demonstrates faint enhancement. No significant surrounding edema or associated mass effect. (Series 13, image 45). Punctate focus of enhancement in the left cerebellum is similar in size (series 13, image 35). Associated surrounding region of FLAIR hyperintensity measuring up to 7-8 mm is similar. Additional associated punctate susceptibility noted. 1.3 x 0.9 cm enhancing lesion in the anterior inferior right frontal lobe (previously 1.3 x 1.0 cm). There is edema surrounding this lesion extending inferiorly within the gyrus rectus with slight gyral expansion, which is similar to prior. Chronic postoperative changes along the lateral left temporal lobe. Associated areas of nodular enhancement are similar to prior. No evidence of new intracranial lesion. Bilateral lens replacement. ORBITS: No acute abnormality. SINUSES: No acute abnormality. BONES AND SOFT TISSUES: Normal bone marrow signal and enhancement. No acute soft tissue abnormality. IMPRESSION: 1. Stable treated brain metastases in the left  pons, left cerebellum, and right frontal lobe. 2. Stable appearance of lateral left temporal lobe resection site. 3. No evidence of new intracranial lesion. 4. No acute findings. Electronically signed by: Donnice Mania MD 04/27/2024 04:43 PM EDT RP Workstation: [id number]    [facility] Clinician Interpretation: I have personally reviewed the radiological images as listed.  My interpretation, in the context of the patient's clinical presentation, is stable disease    Assessment/Plan Malignant melanoma metastatic to brain [facility]  Radiation therapy induced brain necrosis  Xavier Jones is clinically stable today from CNS standpoint, with ongoing dense left LMN facial paresis.  MRI brain continues to demonstrate stable findings.  Recommended continuing imaging surveillance.  We appreciate the opportunity to participate in the care of Xavier Jones.   May con't Lyrica  75mg  TID.  We also ask that Xavier Jones return to clinic in 6 months following next brain MRI, or sooner as needed.  He will continue to follow with Dr. Cornelius for nivolumab   All questions were answered. The patient knows to call the clinic with any problems, questions or concerns. No barriers to learning were detected.  The total time spent in the encounter was 40 minutes and more than 50% was on counseling and review of test results   Arthea MARLA Manns, MD Medical Director of Neuro-Oncology [facility] 04/28/24 9:23 AM

## 2024-04-29 ENCOUNTER — Other Ambulatory Visit: Payer: Self-pay

## 2024-04-29 DIAGNOSIS — H26492 Other secondary cataract, left eye: Secondary | ICD-10-CM | POA: Diagnosis not present

## 2024-04-29 DIAGNOSIS — H04122 Dry eye syndrome of left lacrimal gland: Secondary | ICD-10-CM | POA: Diagnosis not present

## 2024-05-02 ENCOUNTER — Inpatient Hospital Stay

## 2024-05-04 ENCOUNTER — Other Ambulatory Visit: Payer: Self-pay

## 2024-05-04 DIAGNOSIS — M5126 Other intervertebral disc displacement, lumbar region: Secondary | ICD-10-CM

## 2024-05-04 DIAGNOSIS — G629 Polyneuropathy, unspecified: Secondary | ICD-10-CM

## 2024-05-04 DIAGNOSIS — M25552 Pain in left hip: Secondary | ICD-10-CM

## 2024-05-04 DIAGNOSIS — M79604 Pain in right leg: Secondary | ICD-10-CM

## 2024-05-04 MED ORDER — HYDROCODONE-ACETAMINOPHEN 5-325 MG PO TABS: 1.0000 | ORAL_TABLET | ORAL | 0 refills | Status: DC | PRN

## 2024-05-05 ENCOUNTER — Inpatient Hospital Stay

## 2024-05-05 ENCOUNTER — Other Ambulatory Visit: Payer: Self-pay

## 2024-05-05 ENCOUNTER — Inpatient Hospital Stay (HOSPITAL_BASED_OUTPATIENT_CLINIC_OR_DEPARTMENT_OTHER): Admitting: Hematology and Oncology

## 2024-05-05 ENCOUNTER — Telehealth: Payer: Self-pay

## 2024-05-05 VITALS — BP 131/87 | HR 59 | Temp 99.1°F | Resp 14 | Ht 69.5 in | Wt 171.2 lb

## 2024-05-05 DIAGNOSIS — C7800 Secondary malignant neoplasm of unspecified lung: Secondary | ICD-10-CM

## 2024-05-05 DIAGNOSIS — C7931 Secondary malignant neoplasm of brain: Secondary | ICD-10-CM

## 2024-05-05 DIAGNOSIS — Z23 Encounter for immunization: Secondary | ICD-10-CM | POA: Diagnosis not present

## 2024-05-05 DIAGNOSIS — C439 Malignant melanoma of skin, unspecified: Secondary | ICD-10-CM

## 2024-05-05 DIAGNOSIS — C4361 Malignant melanoma of right upper limb, including shoulder: Secondary | ICD-10-CM | POA: Diagnosis not present

## 2024-05-05 DIAGNOSIS — C787 Secondary malignant neoplasm of liver and intrahepatic bile duct: Secondary | ICD-10-CM | POA: Diagnosis not present

## 2024-05-05 DIAGNOSIS — Z5112 Encounter for antineoplastic immunotherapy: Secondary | ICD-10-CM | POA: Diagnosis not present

## 2024-05-05 LAB — CBC WITH DIFFERENTIAL (CANCER CENTER ONLY)
Abs Immature Granulocytes: 0.01 K/uL (ref 0.00–0.07)
Basophils Absolute: 0 K/uL (ref 0.0–0.1)
Basophils Relative: 1 %
Eosinophils Absolute: 0.1 K/uL (ref 0.0–0.5)
Eosinophils Relative: 3 %
HCT: 37.2 % — ABNORMAL LOW (ref 39.0–52.0)
Hemoglobin: 12.6 g/dL — ABNORMAL LOW (ref 13.0–17.0)
Immature Granulocytes: 0 %
Lymphocytes Relative: 36 %
Lymphs Abs: 1.4 K/uL (ref 0.7–4.0)
MCH: 31.7 pg (ref 26.0–34.0)
MCHC: 33.9 g/dL (ref 30.0–36.0)
MCV: 93.7 fL (ref 80.0–100.0)
Monocytes Absolute: 0.4 K/uL (ref 0.1–1.0)
Monocytes Relative: 10 %
Neutro Abs: 1.9 K/uL (ref 1.7–7.7)
Neutrophils Relative %: 50 %
Platelet Count: 233 K/uL (ref 150–400)
RBC: 3.97 MIL/uL — ABNORMAL LOW (ref 4.22–5.81)
RDW: 11.9 % (ref 11.5–15.5)
WBC Count: 3.8 K/uL — ABNORMAL LOW (ref 4.0–10.5)
nRBC: 0 % (ref 0.0–0.2)

## 2024-05-05 LAB — CMP (CANCER CENTER ONLY)
ALT: 14 U/L (ref 0–44)
AST: 21 U/L (ref 15–41)
Albumin: 4.4 g/dL (ref 3.5–5.0)
Alkaline Phosphatase: 63 U/L (ref 38–126)
Anion gap: 8 (ref 5–15)
BUN: 13 mg/dL (ref 6–20)
CO2: 27 mmol/L (ref 22–32)
Calcium: 9.3 mg/dL (ref 8.9–10.3)
Chloride: 104 mmol/L (ref 98–111)
Creatinine: 0.87 mg/dL (ref 0.61–1.24)
GFR, Estimated: >60 mL/min (ref >60)
Glucose, Bld: 134 mg/dL — ABNORMAL HIGH (ref 70–99)
Potassium: 4.4 mmol/L (ref 3.5–5.1)
Sodium: 139 mmol/L (ref 135–145)
Total Bilirubin: 0.4 mg/dL (ref 0.0–1.2)
Total Protein: 6.5 g/dL (ref 6.5–8.1)

## 2024-05-05 LAB — TSH: TSH: 1.22 u[IU]/mL (ref 0.350–4.500)

## 2024-05-05 MED ORDER — SODIUM CHLORIDE 0.9 % IV SOLN
480.0000 mg | Freq: Once | INTRAVENOUS | Status: AC
  Administered 2024-05-05: 480 mg via INTRAVENOUS
  Filled 2024-05-05: qty 48

## 2024-05-05 MED ORDER — INFLUENZA VIRUS VACC SPLIT PF (FLUZONE) 0.5 ML IM SUSY
0.5000 mL | PREFILLED_SYRINGE | Freq: Once | INTRAMUSCULAR | Status: AC
  Administered 2024-05-05: 0.5 mL via INTRAMUSCULAR
  Filled 2024-05-05: qty 0.5

## 2024-05-05 MED ORDER — SODIUM CHLORIDE 0.9 % IV SOLN: Freq: Once | INTRAVENOUS | Status: AC

## 2024-05-05 NOTE — Progress Notes (Signed)
 [facility]  [address] [place],  [place]  [address] [phone]  Clinic Day: 04/07/24  Referring physician: [name], NP  ASSESSMENT & PLAN:   Assessment: [name], Breslow's depth 3.3 mm, Clark level IV, treated with wide local excision, diagnosed in May 2021.  There was still a question as to whether this represented a metastatic lesion, rather than a primary, based on the presence of 2 nodules and no epidermal involvement.  MRI head and CT chest, abdomen and pelvis in June 2021 were negative for metastatic disease.   No adjuvant therapy was recommended.  PET scan in September was negative for metastatic disease.  In June 2023, he was found to have widespread metastatic disease, including to the brain, for which he continues nivolumab  every 4 weeks.   Malignant melanoma metastatic to lung [facility]) Metastatic melanoma to lung diagnosed in found on routine CT imaging in May 2023.  There was over 20 bilateral pulmonary nodules which increased in size by the time treatment was started. The largest 1 nodule is up to 2.3 cm in diameter.  There were also 4 subcutaneous nodules of the back.  Soft tissue needle core biopsy of a right back subcutaneous nodule revealed metastatic malignant melanoma.  He was initially treated with 4 cycles of ipilimumab /nivolumab  with a good response. He has continued maintenance nivolumab .  Most recent CT imaging in July 2025 revealed a stable subpleural nodule of the left lower lobe, otherwise no metastatic disease was seen.   Malignant melanoma metastatic to brain [facility]) Brain metastasis diagnosed in June 2023.  PET scan was concerning for possible lesion in the left frontoparietal lobe.  MRI of the brain confirmed a 2.6 cm heterogeneous lesion of the left superior temporal gyrus with mild enhancement and extensive surrounding edema.  He also has an 8 mm cortical lesion of the  inferomedial right frontal lobe with mild edema, a 2 mm mildly enhancing cortical lesion of the inferolateral left frontal lobe and a intrinsically T1 hyperintense lesion of the left lateral pons measuring 7 mm with surrounding edema.  He had 4 lesions in total, but 3 were less than 1 cm.  He was treated with surgical resection of the larger lesion and stereotactic radiation. He had recurrent neurologic symptoms with left-sided facial droop and left-sided weakness in September 2023 and was placed on dexamethasone  4 mg 3 times daily.  Brain revealed thin linear enhancement along the resection margin of the left temporal resection cavity, which may be postoperative, with additional small areas of somewhat more nodular enhancement.  Interval increase in the size of the right inferior frontal gyrus enhancing lesion with increased surrounding edema, mild local mass effect, and mild local midline shift. Stable to slightly decreased size of the left pontine lesion with decreased associated edema. Previously noted left inferior frontal gyrus lesion is no longer visualized. [name] slowly weaned him off dexamethasone .  MRI brain in December 2023 was stable.  He has had persistent left facial weakness.   MRI brain in March 2024 revealed increased size of a hemorrhagic left pons/middle cerebellar peduncle lesion with increased surrounding edema, similar versus slightly decreased size of a 13 x 10 mm enhancing lesion in the medial right frontal lobe, and mildly diminished intrinsic T1 hyperintensity of the left temporal resection site with linear enhancement along the lateral margin, probably postsurgical/post radiation.  PET brain in April did not show hyperactivity, but more  radiation necrosis, so these changes are more consistent with posttreatment changes.  MRI head in August revealed new punctate superior right frontal lobe enhancement compatible with interval metastasis, interval regression of enhancement and edema  in the left brachium pontis and pons. There was also a vasogenic edema at a pre-existing right frontal metastasis with underlying lesion enhancement remaining stable.   In December 2024 a repeat MRI brain looked good and the punctate lesion seen on the last scan has resolved. MRI brain in April 2025 revealed continued stable treated metastases of the left brainstem, right inferior frontal gyrus, left temporal lobe resection site. Additional punctate enhancement in the central left cerebellum, intermittently visible over this series of exams, and associated with a rounded 8 mm area of FLAIR hyperintensity were stable since August 2024, although larger than in 2023. This is most likely an additional tiny treated metastasis. No new metastatic disease identified. Repeat MRI is stable and he was evaluated by [name] 10/23.  Plan: He continues to feel well. He did see [name] this week and was somewhat disappointed that he didn't have any recommendations for his ongoing neurological symptoms. He understands the big picture with his disease; however, some of his symptoms are frustrating. I advised that we will continue to follow and do whatever we can to support him. I will see him back in 4 weeks with CBC and CMP. The patient understands the plans discussed today and is in agreement with them.  He knows to contact our office if he develops concerns prior to his next appointment.  I provided 20 minutes of face-to-face time during this encounter and > 50% was spent counseling as documented under my assessment and plan.    Eleanor Bach, FNP-BC [facility] [place] - A DEPT OF [facility] [address] [place] [address] Dept: [phone] Dept Fax: [phone]   No orders of the defined types were placed in this encounter.     CHIEF COMPLAINT:  CC: Metastatic melanoma  Current Treatment: Maintenance nivolumab  every 4 weeks  HISTORY OF PRESENT  ILLNESS:   Oncology History  Melanoma of skin (HCC)  12/01/2019 Initial Diagnosis   Melanoma of skin (HCC)   12/05/2019 Cancer Staging   Staging form: Melanoma of the Skin, AJCC 8th Edition - Clinical stage from 12/05/2019: Stage IIA (cT3a, cN0, cM0) - Signed by Cornelius Wanda VEAR, MD on 05/30/2020   02/18/2021 Genetic Testing   No pathogenic variants detected in [facility] Multi-Cancer +RNA Panel.  Variants of uncertain significance detected in APC (c.681C>G (p.Asp227Glu)), CDKN1C (c.610C>G (p.Pro204Ala)), and FH (c.1151C>T (p.Ala384Val)).  The report date is February 18, 2021.    The Multi-Cancer + RNA Panel offered by [facility] includes sequencing and/or deletion/duplication analysis of the following 84 genes:  AIP*, ALK, APC*, ATM*, AXIN2*, BAP1*, BARD1*, BLM*, BMPR1A*, BRCA1*, BRCA2*, BRIP1*, CASR, CDC73*, CDH1*, CDK4, CDKN1B*, CDKN1C*, CDKN2A, CEBPA, CHEK2*, CTNNA1*, DICER1*, DIS3L2*, EGFR, EPCAM, FH*, FLCN*, GATA2*, GPC3, GREM1, HOXB13, HRAS, KIT, MAX*, MEN1*, MET, MITF, MLH1*, MSH2*, MSH3*, MSH6*, MUTYH*, NBN*, NF1*, NF2*, NTHL1*, PALB2*, PDGFRA, PHOX2B, PMS2*, POLD1*, POLE*, POT1*, PRKAR1A*, PTCH1*, PTEN*, RAD50*, RAD51C*, RAD51D*, RB1*, RECQL4, RET, RUNX1*, SDHA*, SDHAF2*, SDHB*, SDHC*, SDHD*, SMAD4*, SMARCA4*, SMARCB1*, SMARCE1*, STK11*, SUFU*, TERC, TERT, TMEM127*, Tp53*, TSC1*, TSC2*, VHL*, WRN*, and WT1.  RNA analysis is performed for * genes.   01/30/2022 - 02/20/2022 Chemotherapy   Patient is on Treatment Plan : MELANOMA Nivolumab  + Ipilimumab  (1/3) q21d / Nivolumab  q28d  01/30/2022 -  Chemotherapy   Patient is on Treatment Plan : MELANOMA Nivolumab  (480) q28d     Malignant melanoma metastatic to brain (HCC)  12/19/2021 Initial Diagnosis   Malignant melanoma metastatic to brain (HCC)   01/30/2022 - 02/20/2022 Chemotherapy   Patient is on Treatment Plan : MELANOMA Nivolumab  + Ipilimumab  (1/3) q21d / Nivolumab  q28d     01/30/2022 -  Chemotherapy   Patient is on Treatment Plan :  MELANOMA Nivolumab  (480) q28d     Malignant melanoma metastatic to lung (HCC)  12/19/2021 Initial Diagnosis   Malignant melanoma metastatic to lung (HCC)   01/30/2022 - 02/20/2022 Chemotherapy   Patient is on Treatment Plan : MELANOMA Nivolumab  + Ipilimumab  (1/3) q21d / Nivolumab  q28d     01/30/2022 -  Chemotherapy   Patient is on Treatment Plan : MELANOMA Nivolumab  (480) q28d     03/24/2022 Imaging   CTA chest:  IMPRESSION:  1. Small nonocclusive segmental level pulmonary embolism in the  inferior LEFT upper lobe.  2. Marked bronchial wall thickening with areas of patchy basilar  opacities and new ground-glass opacities in the upper lobes.  Constellation of findings may be infectious bronchiolitis/bronchitis  though the possibility of drug related changes in the setting of  immunotherapy are also considered. Furthermore, given basilar  predominance and some material in basilar bronchial structures would  correlate with any risk factors for or history of aspiration.  3. Response to therapy of bilateral metastatic lesions. Some lymph  nodes in the chest with interval increase in size are equivocal at  this time, potentially reactive, but warrant attention on subsequent  imaging.    07/22/2022 Imaging   CT chest, abdomen and pelvis:  IMPRESSION:  1. Interval positive response to therapy. Resolved subcarinal and  bilateral hilar lymphadenopathy. Stable to decreased left pulmonary  nodules. No new or progressive metastatic disease in the chest,  abdomen or pelvis.  2. One vessel coronary atherosclerosis.  3. Aortic Atherosclerosis (ICD10-I70.0).    Malignant neoplasm metastatic to liver (HCC)  12/19/2021 Initial Diagnosis   Malignant neoplasm metastatic to liver (HCC)   01/30/2022 - 02/20/2022 Chemotherapy   Patient is on Treatment Plan : MELANOMA Nivolumab  + Ipilimumab  (1/3) q21d / Nivolumab  q28d     01/30/2022 -  Chemotherapy   Patient is on Treatment Plan : MELANOMA Nivolumab   (480) q28d     07/22/2022 Imaging   CT chest, abdomen and pelvis:  IMPRESSION:  1. Interval positive response to therapy. Resolved subcarinal and  bilateral hilar lymphadenopathy. Stable to decreased left pulmonary  nodules. No new or progressive metastatic disease in the chest,  abdomen or pelvis.  2. One vessel coronary atherosclerosis.  3. Aortic Atherosclerosis (ICD10-I70.0).        INTERVAL HISTORY:  Bliss is here today for repeat clinical assessment of his metastatic malignant melanoma prior to nivolumab .  Patient states that he feels well but does complain of arthritic pain and stiffness in his hands and right leg pain rated 4/10. He also notes some symptoms of light-headedness and dizziness which he attributes to inner ear issues. He did not notice these new symptoms after recent medication dosage changes. I recommend that he visit an audiologist at a hearing clinic to investigate this. He remains on iron and folate supplements, hydrocodone , Lyrica , and Xarelto  without difficulty.He is scheduled for day 1, cycle 24 of nivolumab .He had his MRI and evaluation with [name] last week.  He denies fever, chills, night sweats, or other signs of infection.  He denies cardiorespiratory and gastrointestinal issues. He  denies pain. His appetite is good and His weight has decreased 3 pounds over last 4 weeks.  REVIEW OF SYSTEMS:   Review of Systems  Constitutional:  Negative for appetite change, chills, fatigue, fever and unexpected weight change.  HENT:   Negative for lump/mass, mouth sores and sore throat.   Respiratory:  Negative for cough and shortness of breath.   Cardiovascular:  Negative for chest pain and leg swelling.  Gastrointestinal:  Negative for abdominal pain, constipation, diarrhea, nausea and vomiting.  Genitourinary:  Negative for difficulty urinating, dysuria, frequency and hematuria.   Musculoskeletal:  Positive for arthralgias (hands). Negative for back pain, myalgias  and neck pain.       Right leg pain 4/10  Skin:  Negative for itching, rash and wound.  Neurological:  Positive for dizziness and light-headedness. Negative for extremity weakness, headaches and numbness.  Hematological:  Negative for adenopathy. Does not bruise/bleed easily.  Psychiatric/Behavioral:  Negative for depression and sleep disturbance. The patient is not nervous/anxious.      VITALS:   Blood pressure 131/87, pulse (!) 59, temperature 99.1 F (37.3 C), temperature source Oral, resp. rate 14, height 5' 9.5 (1.765 m), weight 171 lb 3.2 oz (77.7 kg), SpO2 99%.  Wt Readings from Last 3 Encounters:  05/05/24 171 lb 3.2 oz (77.7 kg)  04/28/24 171 lb 11.2 oz (77.9 kg)  04/07/24 172 lb 14.4 oz (78.4 kg)    Body mass index is 24.92 kg/m.  Performance status (ECOG): 1 - Symptomatic but completely ambulatory    PHYSICAL EXAM:   Physical Exam Vitals and nursing note reviewed.  Constitutional:      General: He is not in acute distress.    Appearance: Normal appearance. He is normal weight.  HENT:     Head: Normocephalic and atraumatic.     Mouth/Throat:     Mouth: Mucous membranes are moist.     Pharynx: Oropharynx is clear. No oropharyngeal exudate or posterior oropharyngeal erythema.  Eyes:     General: No scleral icterus.    Extraocular Movements: Extraocular movements intact.     Conjunctiva/sclera: Conjunctivae normal.     Pupils: Pupils are equal, round, and reactive to light.  Cardiovascular:     Rate and Rhythm: Normal rate and regular rhythm.     Heart sounds: Normal heart sounds. No murmur heard.    No friction rub. No gallop.  Pulmonary:     Effort: Pulmonary effort is normal.     Breath sounds: Normal breath sounds. No wheezing, rhonchi or rales.  Abdominal:     General: Bowel sounds are normal. There is no distension.     Palpations: Abdomen is soft. There is no hepatomegaly, splenomegaly or mass.     Tenderness: There is no abdominal tenderness.   Musculoskeletal:        General: Normal range of motion.     Cervical back: Normal range of motion and neck supple. No tenderness.     Right lower leg: No edema.     Left lower leg: No edema.  Lymphadenopathy:     Cervical: No cervical adenopathy.     Upper Body:     Right upper body: No supraclavicular or axillary adenopathy.     Left upper body: No supraclavicular or axillary adenopathy.     Lower Body: No right inguinal adenopathy. No left inguinal adenopathy.  Skin:    General: Skin is warm and dry.  Coloration: Skin is not jaundiced.     Findings: No rash.     Comments: No new subcutaneous masses or suspicious skin lesions. Vitiligo of all extremities and neck.  Neurological:     Mental Status: He is alert and oriented to person, place, and time.     Cranial Nerves: No cranial nerve deficit.     Comments: Left facial droop  Psychiatric:        Mood and Affect: Mood normal.        Behavior: Behavior normal.        Thought Content: Thought content normal.     LABS:      Latest Ref Rng & Units 05/05/2024    8:58 AM 04/07/2024    9:01 AM 03/10/2024    9:00 AM  CBC  WBC 4.0 - 10.5 K/uL 3.8  4.6  3.9   Hemoglobin 13.0 - 17.0 g/dL 87.3  87.3  87.8   Hematocrit 39.0 - 52.0 % 37.2  37.7  37.1   Platelets 150 - 400 K/uL 233  249  263       Latest Ref Rng & Units 05/05/2024    8:58 AM 04/07/2024    9:01 AM 03/10/2024    9:00 AM  CMP  Glucose 70 - 99 mg/dL 865  98  887   BUN 6 - 20 mg/dL 13  20  18    Creatinine 0.61 - 1.24 mg/dL 9.12  9.11  9.00   Sodium 135 - 145 mmol/L 139  141  145   Potassium 3.5 - 5.1 mmol/L 4.4  4.4  4.5   Chloride 98 - 111 mmol/L 104  104  109   CO2 22 - 32 mmol/L 27  26  25    Calcium 8.9 - 10.3 mg/dL 9.3  9.4  9.2   Total Protein 6.5 - 8.1 g/dL 6.5  6.9  6.7   Total Bilirubin 0.0 - 1.2 mg/dL 0.4  0.4  0.4   Alkaline Phos 38 - 126 U/L 63  73  71   AST 15 - 41 U/L 21  20  18    ALT 0 - 44 U/L 14  11  15       Lab Results  Component Value  Date   TOTALPROTELP 6.7 08/27/2023   TOTALPROTELP 6.7 08/27/2023   ALBUMINELP 3.9 08/27/2023   A1GS 0.2 08/27/2023   A2GS 0.7 08/27/2023   BETS 1.0 08/27/2023   GAMS 0.8 08/27/2023   MSPIKE Not Observed 08/27/2023   SPEI Comment 08/27/2023   Lab Results  Component Value Date   TIBC 382 06/02/2023   TIBC 297 05/21/2022   FERRITIN 22 (L) 06/02/2023   FERRITIN 41 05/21/2022   IRONPCTSAT 23 06/02/2023   IRONPCTSAT 18 05/21/2022   Lab Results  Component Value Date   LDH 139 05/21/2022   LDH 145 12/19/2021    STUDIES:   MR BRAIN W WO CONTRAST Result Date: 04/27/2024 EXAM: MRI BRAIN WITH AND WITHOUT CONTRAST 04/27/2024 11:52:34 AM TECHNIQUE: Multiplanar multisequence MRI of the head/brain was performed with and without the administration of 8 ml Vueway  intravenous contrast. COMPARISON: MRI head 10/23/2023. CLINICAL HISTORY: Brain/CNS neoplasm, assess treatment response. RE STAGING SRS ; Mri brain w wo; 8 ml vueway ; 10 ml vial; 2 ml wasted; Hx metastatic melanoma, brain metastases treated with combined resection and SRS. Restaging. FINDINGS: BRAIN AND VENTRICLES: No acute infarct. No acute intracranial hemorrhage. No midline shift. Slight gyral expansion is noted in the right frontal lobe associated with the  enhancing lesion. The basilar cisterns are patent. Normal appearance of the ventricles. No hydrocephalus. The sella is unremarkable. Normal flow voids. There is an intrinsically T1 hyperintense lesion in the left aspect of the pons at the level of the middle cerebellar peduncle which measures approximately 1.1 x 0.9 cm (previously 1.2 x 1.0 cm on the 10/23/2023 study). The lesion demonstrates faint enhancement. No significant surrounding edema or associated mass effect. (Series 13, image 45). Punctate focus of enhancement in the left cerebellum is similar in size (series 13, image 35). Associated surrounding region of FLAIR hyperintensity measuring up to 7-8 mm is similar. Additional  associated punctate susceptibility noted. 1.3 x 0.9 cm enhancing lesion in the anterior inferior right frontal lobe (previously 1.3 x 1.0 cm). There is edema surrounding this lesion extending inferiorly within the gyrus rectus with slight gyral expansion, which is similar to prior. Chronic postoperative changes along the lateral left temporal lobe. Associated areas of nodular enhancement are similar to prior. No evidence of new intracranial lesion. Bilateral lens replacement. ORBITS: No acute abnormality. SINUSES: No acute abnormality. BONES AND SOFT TISSUES: Normal bone marrow signal and enhancement. No acute soft tissue abnormality. IMPRESSION: 1. Stable treated brain metastases in the left pons, left cerebellum, and right frontal lobe. 2. Stable appearance of lateral left temporal lobe resection site. 3. No evidence of new intracranial lesion. 4. No acute findings. Electronically signed by: Donnice Mania MD 04/27/2024 04:43 PM EDT RP Workstation: [id number]      HISTORY:   Past Medical History:  Diagnosis Date   Acute pulmonary embolism without acute cor pulmonale (HCC) 05/01/2022   Anemia 05/20/2022   Chemotherapy follow-up examination 04/21/2023   Degenerative disc disease, cervical 08/27/2023   Depression    mild   Family history of breast cancer 01/22/2021   Family history of prostate cancer 01/22/2021   Genetic testing 02/22/2021   No pathogenic variants detected in [facility] Multi-Cancer +RNA Panel.  Variants of uncertain significance detected in APC (c.681C>G (p.Asp227Glu)), CDKN1C (c.610C>G (p.Pro204Ala)), and FH (c.1151C>T (p.Ala384Val)).  The report date is February 18, 2021.       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) 12/19/2021   Melanoma of skin (HCC) 12/01/2019   Multiple dysplastic nevi 05/30/2020   Peripheral neuropathy 05/09/2023   Port-A-Cath in place 07/28/2022   Pruritic rash 02/18/2022   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   Right shoulder pain 02/18/2022   Skin cancer    melanoma    Past Surgical History:  Procedure Laterality Date   APPLICATION OF CRANIAL NAVIGATION N/A 01/03/2022   Procedure: APPLICATION OF CRANIAL NAVIGATION;  Surgeon: Lanis Pupa, MD;  Location: [facility] OR;  Service: Neurosurgery;  Laterality: N/A;   CRANIOTOMY Left 01/03/2022   Procedure: Stereotactic Left temporal craniotomy for resection of tumor;  Surgeon: Lanis Pupa, MD;  Location: [facility] OR;  Service: Neurosurgery;  Laterality: Left;   MELANOMA EXCISION WITH SENTINEL LYMPH NODE BIOPSY Right 2021   SEPTOPLASTY  05/13/2023   Dr  Ma   SPINAL FUSION W/ LUQUE UNIT ROD  2003    Family History  Problem Relation Age of Onset   Prostate cancer Father        dx 53s   Breast cancer Maternal Aunt        dx after 52; bilateral mastectomy   Stomach cancer Maternal Grandfather        dx 77s   Breast cancer Cousin 24       maternal male cousin; mets    Social History:  reports that he quit smoking about 27 years ago. His smoking use included cigarettes. He has never used smokeless tobacco. He reports current alcohol use of about 14.0 standard drinks of alcohol per week. He reports that he does not currently use drugs.The patient is alone today.  Allergies:  Allergies  Allergen Reactions   Sulfa Antibiotics Rash   Testosterone  Itching    Gel    Current Medications: Current Outpatient Medications  Medication Sig  Dispense Refill   acebutolol  (SECTRAL ) 200 MG capsule TAKE ONE CAPSULE BY MOUTH EVERY DAY 90 capsule 3   acyclovir  (ZOVIRAX ) 400 MG tablet Take 400 mg by mouth 3 (three) times daily.     amitriptyline  (ELAVIL ) 25 MG tablet Take 50 mg by mouth at bedtime.     ergocalciferol (VITAMIN D2) 1.25 MG (50000 UT) capsule Take 50,000 Units by mouth every Wednesday.     folic acid  (FOLVITE ) 1 MG tablet TAKE ONE TABLET BY MOUTH DAILY 30 tablet 5   HYDROcodone -acetaminophen  (NORCO/VICODIN) 5-325 MG tablet Take 1 tablet by mouth every 4 (four) hours as needed for moderate pain (pain score 4-6). 150 tablet 0   hydrOXYzine  (ATARAX ) 25 MG tablet TAKE ONE TABLET BY MOUTH 3 TIMES A DAY AS NEEDED FOR ITCHING 60 tablet 3   lidocaine -prilocaine  (EMLA ) cream Apply 1 Application topically as needed. 30 g 0   lisinopril  (ZESTRIL ) 20 MG tablet Take 1 tablet (20 mg total) by mouth daily. 30 tablet 1   pregabalin  (LYRICA ) 100 MG capsule Take 1 capsule (100 mg total) by mouth 3 (three) times daily. 90 capsule 5   XARELTO  20 MG TABS tablet TAKE ONE TABLET BY MOUTH EVERY DAY WITH SUPPER 30 tablet 3   No current facility-administered medications for this visit.   Facility-Administered Medications Ordered in Other Visits  Medication Dose Route Frequency Provider Last Rate Last Admin   0.9 %  sodium chloride  infusion   Intravenous Once Cornelius Wanda DEL, MD       nivolumab  (OPDIVO ) 480 mg in sodium chloride  0.9 % 100 mL chemo infusion  480 mg Intravenous Once Cornelius Wanda DEL, MD

## 2024-05-05 NOTE — Patient Instructions (Signed)
 Nivolumab Injection What is this medication? NIVOLUMAB (nye VOL ue mab) treats some types of cancer. It works by helping your immune system slow or stop the spread of cancer cells. It is a monoclonal antibody. This medicine may be used for other purposes; ask your health care provider or pharmacist if you have questions. COMMON BRAND NAME(S): Opdivo What should I tell my care team before I take this medication? They need to know if you have any of these conditions: Allogeneic stem cell transplant (uses someone else's stem cells) Autoimmune diseases, such as Crohn disease, ulcerative colitis, lupus History of chest radiation Nervous system problems, such as Guillain-Barre syndrome or myasthenia gravis Organ transplant An unusual or allergic reaction to nivolumab, other medications, foods, dyes, or preservatives Pregnant or trying to get pregnant Breast-feeding How should I use this medication? This medication is infused into a vein. It is given in a hospital or clinic setting. A special MedGuide will be given to you before each treatment. Be sure to read this information carefully each time. Talk to your care team about the use of this medication in children. While it may be prescribed for children as young as 12 years for selected conditions, precautions do apply. Overdosage: If you think you have taken too much of this medicine contact a poison control center or emergency room at once. NOTE: This medicine is only for you. Do not share this medicine with others. What if I miss a dose? Keep appointments for follow-up doses. It is important not to miss your dose. Call your care team if you are unable to keep an appointment. What may interact with this medication? Interactions have not been studied. This list may not describe all possible interactions. Give your health care provider a list of all the medicines, herbs, non-prescription drugs, or dietary supplements you use. Also tell them if you  smoke, drink alcohol, or use illegal drugs. Some items may interact with your medicine. What should I watch for while using this medication? Your condition will be monitored carefully while you are receiving this medication. You may need blood work while taking this medication. This medication may cause serious skin reactions. They can happen weeks to months after starting the medication. Contact your care team right away if you notice fevers or flu-like symptoms with a rash. The rash may be red or purple and then turn into blisters or peeling of the skin. You may also notice a red rash with swelling of the face, lips, or lymph nodes in your neck or under your arms. Tell your care team right away if you have any change in your eyesight. Talk to your care team if you are pregnant or think you might be pregnant. A negative pregnancy test is required before starting this medication. A reliable form of contraception is recommended while taking this medication and for 5 months after the last dose. Talk to your care team about effective forms of contraception. Do not breast-feed while taking this medication and for 5 months after the last dose. What side effects may I notice from receiving this medication? Side effects that you should report to your care team as soon as possible: Allergic reactions--skin rash, itching, hives, swelling of the face, lips, tongue, or throat Dry cough, shortness of breath or trouble breathing Eye pain, redness, irritation, or discharge with blurry or decreased vision Heart muscle inflammation--unusual weakness or fatigue, shortness of breath, chest pain, fast or irregular heartbeat, dizziness, swelling of the ankles, feet, or hands Hormone  gland problems--headache, sensitivity to light, unusual weakness or fatigue, dizziness, fast or irregular heartbeat, increased sensitivity to cold or heat, excessive sweating, constipation, hair loss, increased thirst or amount of urine,  tremors or shaking, irritability Infusion reactions--chest pain, shortness of breath or trouble breathing, feeling faint or lightheaded Kidney injury (glomerulonephritis)--decrease in the amount of urine, red or dark brown urine, foamy or bubbly urine, swelling of the ankles, hands, or feet Liver injury--right upper belly pain, loss of appetite, nausea, light-colored stool, dark yellow or brown urine, yellowing skin or eyes, unusual weakness or fatigue Pain, tingling, or numbness in the hands or feet, muscle weakness, change in vision, confusion or trouble speaking, loss of balance or coordination, trouble walking, seizures Rash, fever, and swollen lymph nodes Redness, blistering, peeling, or loosening of the skin, including inside the mouth Sudden or severe stomach pain, bloody diarrhea, fever, nausea, vomiting Side effects that usually do not require medical attention (report these to your care team if they continue or are bothersome): Bone, joint, or muscle pain Diarrhea Fatigue Loss of appetite Nausea Skin rash This list may not describe all possible side effects. Call your doctor for medical advice about side effects. You may report side effects to FDA at 1-800-FDA-1088. Where should I keep my medication? This medication is given in a hospital or clinic. It will not be stored at home. NOTE: This sheet is a summary. It may not cover all possible information. If you have questions about this medicine, talk to your doctor, pharmacist, or health care provider.  2024 Elsevier/Gold Standard (2021-10-21 00:00:00)

## 2024-05-06 ENCOUNTER — Other Ambulatory Visit: Payer: Self-pay

## 2024-05-06 LAB — T4: T4, Total: 7 ug/dL (ref 4.5–12.0)

## 2024-05-10 ENCOUNTER — Encounter: Payer: Self-pay | Admitting: Oncology

## 2024-05-13 ENCOUNTER — Encounter: Payer: Self-pay | Admitting: Oncology

## 2024-06-01 ENCOUNTER — Inpatient Hospital Stay

## 2024-06-01 ENCOUNTER — Other Ambulatory Visit: Payer: Self-pay | Admitting: Oncology

## 2024-06-01 ENCOUNTER — Encounter: Payer: Self-pay | Admitting: Oncology

## 2024-06-01 ENCOUNTER — Inpatient Hospital Stay: Attending: Hematology and Oncology | Admitting: Oncology

## 2024-06-01 VITALS — BP 114/75 | HR 62 | Temp 98.1°F | Resp 16 | Ht 69.5 in | Wt 169.6 lb

## 2024-06-01 DIAGNOSIS — C7801 Secondary malignant neoplasm of right lung: Secondary | ICD-10-CM | POA: Diagnosis not present

## 2024-06-01 DIAGNOSIS — G629 Polyneuropathy, unspecified: Secondary | ICD-10-CM

## 2024-06-01 DIAGNOSIS — C7800 Secondary malignant neoplasm of unspecified lung: Secondary | ICD-10-CM

## 2024-06-01 DIAGNOSIS — M5126 Other intervertebral disc displacement, lumbar region: Secondary | ICD-10-CM

## 2024-06-01 DIAGNOSIS — M25552 Pain in left hip: Secondary | ICD-10-CM

## 2024-06-01 DIAGNOSIS — C7931 Secondary malignant neoplasm of brain: Secondary | ICD-10-CM

## 2024-06-01 DIAGNOSIS — C439 Malignant melanoma of skin, unspecified: Secondary | ICD-10-CM

## 2024-06-01 DIAGNOSIS — C787 Secondary malignant neoplasm of liver and intrahepatic bile duct: Secondary | ICD-10-CM

## 2024-06-01 DIAGNOSIS — M79604 Pain in right leg: Secondary | ICD-10-CM | POA: Diagnosis not present

## 2024-06-01 DIAGNOSIS — Z5112 Encounter for antineoplastic immunotherapy: Secondary | ICD-10-CM | POA: Diagnosis not present

## 2024-06-01 DIAGNOSIS — C4361 Malignant melanoma of right upper limb, including shoulder: Secondary | ICD-10-CM | POA: Diagnosis not present

## 2024-06-01 DIAGNOSIS — C7802 Secondary malignant neoplasm of left lung: Secondary | ICD-10-CM | POA: Diagnosis not present

## 2024-06-01 DIAGNOSIS — Z87891 Personal history of nicotine dependence: Secondary | ICD-10-CM | POA: Diagnosis not present

## 2024-06-01 DIAGNOSIS — Z79620 Long term (current) use of immunosuppressive biologic: Secondary | ICD-10-CM | POA: Diagnosis not present

## 2024-06-01 LAB — CMP (CANCER CENTER ONLY)
ALT: 13 U/L (ref 0–44)
AST: 21 U/L (ref 15–41)
Albumin: 4 g/dL (ref 3.5–5.0)
Alkaline Phosphatase: 58 U/L (ref 38–126)
Anion gap: 10 (ref 5–15)
BUN: 15 mg/dL (ref 6–20)
CO2: 27 mmol/L (ref 22–32)
Calcium: 9.4 mg/dL (ref 8.9–10.3)
Chloride: 104 mmol/L (ref 98–111)
Creatinine: 0.91 mg/dL (ref 0.61–1.24)
GFR, Estimated: >60 mL/min (ref >60)
Glucose, Bld: 172 mg/dL — ABNORMAL HIGH (ref 70–99)
Potassium: 4.2 mmol/L (ref 3.5–5.1)
Sodium: 141 mmol/L (ref 135–145)
Total Bilirubin: 0.3 mg/dL (ref 0.0–1.2)
Total Protein: 6.2 g/dL — ABNORMAL LOW (ref 6.5–8.1)

## 2024-06-01 LAB — CBC WITH DIFFERENTIAL (CANCER CENTER ONLY)
Abs Immature Granulocytes: 0 K/uL (ref 0.00–0.07)
Basophils Absolute: 0 K/uL (ref 0.0–0.1)
Basophils Relative: 1 %
Eosinophils Absolute: 0.1 K/uL (ref 0.0–0.5)
Eosinophils Relative: 2 %
HCT: 36.7 % — ABNORMAL LOW (ref 39.0–52.0)
Hemoglobin: 12.3 g/dL — ABNORMAL LOW (ref 13.0–17.0)
Immature Granulocytes: 0 %
Lymphocytes Relative: 34 %
Lymphs Abs: 1.4 K/uL (ref 0.7–4.0)
MCH: 31.6 pg (ref 26.0–34.0)
MCHC: 33.5 g/dL (ref 30.0–36.0)
MCV: 94.3 fL (ref 80.0–100.0)
Monocytes Absolute: 0.4 K/uL (ref 0.1–1.0)
Monocytes Relative: 9 %
Neutro Abs: 2.1 K/uL (ref 1.7–7.7)
Neutrophils Relative %: 54 %
Platelet Count: 231 K/uL (ref 150–400)
RBC: 3.89 MIL/uL — ABNORMAL LOW (ref 4.22–5.81)
RDW: 12.2 % (ref 11.5–15.5)
WBC Count: 4 K/uL (ref 4.0–10.5)
nRBC: 0 % (ref 0.0–0.2)

## 2024-06-01 LAB — TSH: TSH: 1.04 u[IU]/mL (ref 0.350–4.500)

## 2024-06-01 MED ORDER — HYDROCODONE-ACETAMINOPHEN 5-325 MG PO TABS: 1.0000 | ORAL_TABLET | ORAL | 0 refills | Status: DC | PRN

## 2024-06-01 MED ORDER — SODIUM CHLORIDE 0.9 % IV SOLN
480.0000 mg | Freq: Once | INTRAVENOUS | Status: AC
  Administered 2024-06-01: 480 mg via INTRAVENOUS
  Filled 2024-06-01: qty 48

## 2024-06-01 MED ORDER — SODIUM CHLORIDE 0.9 % IV SOLN: Freq: Once | INTRAVENOUS | Status: AC

## 2024-06-01 NOTE — Patient Instructions (Signed)
 Nivolumab Injection What is this medication? NIVOLUMAB (nye VOL ue mab) treats some types of cancer. It works by helping your immune system slow or stop the spread of cancer cells. It is a monoclonal antibody. This medicine may be used for other purposes; ask your health care provider or pharmacist if you have questions. COMMON BRAND NAME(S): Opdivo What should I tell my care team before I take this medication? They need to know if you have any of these conditions: Allogeneic stem cell transplant (uses someone else's stem cells) Autoimmune diseases, such as Crohn disease, ulcerative colitis, lupus History of chest radiation Nervous system problems, such as Guillain-Barre syndrome or myasthenia gravis Organ transplant An unusual or allergic reaction to nivolumab, other medications, foods, dyes, or preservatives Pregnant or trying to get pregnant Breast-feeding How should I use this medication? This medication is infused into a vein. It is given in a hospital or clinic setting. A special MedGuide will be given to you before each treatment. Be sure to read this information carefully each time. Talk to your care team about the use of this medication in children. While it may be prescribed for children as young as 12 years for selected conditions, precautions do apply. Overdosage: If you think you have taken too much of this medicine contact a poison control center or emergency room at once. NOTE: This medicine is only for you. Do not share this medicine with others. What if I miss a dose? Keep appointments for follow-up doses. It is important not to miss your dose. Call your care team if you are unable to keep an appointment. What may interact with this medication? Interactions have not been studied. This list may not describe all possible interactions. Give your health care provider a list of all the medicines, herbs, non-prescription drugs, or dietary supplements you use. Also tell them if you  smoke, drink alcohol, or use illegal drugs. Some items may interact with your medicine. What should I watch for while using this medication? Your condition will be monitored carefully while you are receiving this medication. You may need blood work while taking this medication. This medication may cause serious skin reactions. They can happen weeks to months after starting the medication. Contact your care team right away if you notice fevers or flu-like symptoms with a rash. The rash may be red or purple and then turn into blisters or peeling of the skin. You may also notice a red rash with swelling of the face, lips, or lymph nodes in your neck or under your arms. Tell your care team right away if you have any change in your eyesight. Talk to your care team if you are pregnant or think you might be pregnant. A negative pregnancy test is required before starting this medication. A reliable form of contraception is recommended while taking this medication and for 5 months after the last dose. Talk to your care team about effective forms of contraception. Do not breast-feed while taking this medication and for 5 months after the last dose. What side effects may I notice from receiving this medication? Side effects that you should report to your care team as soon as possible: Allergic reactions--skin rash, itching, hives, swelling of the face, lips, tongue, or throat Dry cough, shortness of breath or trouble breathing Eye pain, redness, irritation, or discharge with blurry or decreased vision Heart muscle inflammation--unusual weakness or fatigue, shortness of breath, chest pain, fast or irregular heartbeat, dizziness, swelling of the ankles, feet, or hands Hormone  gland problems--headache, sensitivity to light, unusual weakness or fatigue, dizziness, fast or irregular heartbeat, increased sensitivity to cold or heat, excessive sweating, constipation, hair loss, increased thirst or amount of urine,  tremors or shaking, irritability Infusion reactions--chest pain, shortness of breath or trouble breathing, feeling faint or lightheaded Kidney injury (glomerulonephritis)--decrease in the amount of urine, red or dark brown urine, foamy or bubbly urine, swelling of the ankles, hands, or feet Liver injury--right upper belly pain, loss of appetite, nausea, light-colored stool, dark yellow or brown urine, yellowing skin or eyes, unusual weakness or fatigue Pain, tingling, or numbness in the hands or feet, muscle weakness, change in vision, confusion or trouble speaking, loss of balance or coordination, trouble walking, seizures Rash, fever, and swollen lymph nodes Redness, blistering, peeling, or loosening of the skin, including inside the mouth Sudden or severe stomach pain, bloody diarrhea, fever, nausea, vomiting Side effects that usually do not require medical attention (report these to your care team if they continue or are bothersome): Bone, joint, or muscle pain Diarrhea Fatigue Loss of appetite Nausea Skin rash This list may not describe all possible side effects. Call your doctor for medical advice about side effects. You may report side effects to FDA at 1-800-FDA-1088. Where should I keep my medication? This medication is given in a hospital or clinic. It will not be stored at home. NOTE: This sheet is a summary. It may not cover all possible information. If you have questions about this medicine, talk to your doctor, pharmacist, or health care provider.  2024 Elsevier/Gold Standard (2021-10-21 00:00:00)

## 2024-06-01 NOTE — Progress Notes (Signed)
 [facility]  [address] [place],  [place]  [address] [phone]  Clinic Day: 06/01/24  Referring physician: [name], NP  ASSESSMENT & PLAN:  Assessment: [name], Breslow's depth 3.3 mm, Clark level IV, treated with wide local excision, diagnosed in May 2021.  There was still a question as to whether this represented a metastatic lesion, rather than a primary, based on the presence of 2 nodules and no epidermal involvement.  MRI head and CT chest, abdomen and pelvis in June 2021 were negative for metastatic disease.   No adjuvant therapy was recommended.  PET scan in September was negative for metastatic disease.  In June 2023, he was found to have widespread metastatic disease, including to the brain, for which he continues nivolumab  every 4 weeks.   Malignant melanoma metastatic to lung [facility]) Metastatic melanoma to lung diagnosed in found on routine CT imaging in May 2023.  There was over 20 bilateral pulmonary nodules which increased in size by the time treatment was started. The largest 1 nodule is up to 2.3 cm in diameter.  There were also 4 subcutaneous nodules of the back.  Soft tissue needle core biopsy of a right back subcutaneous nodule revealed metastatic malignant melanoma.  He was initially treated with 4 cycles of ipilimumab /nivolumab  with a good response. He has continued maintenance nivolumab .  Most recent CT imaging in July 2025 revealed a stable subpleural nodule of the left lower lobe, otherwise no metastatic disease was seen. I feel a small right axillary node which does not feel suspicious for malignancy.   Malignant melanoma metastatic to brain [facility]) Brain metastasis diagnosed in June 2023.  PET scan was concerning for possible lesion in the left frontoparietal lobe.  MRI of the brain confirmed a 2.6 cm heterogeneous lesion of the left superior temporal gyrus with mild enhancement  and extensive surrounding edema.  He also has an 8 mm cortical lesion of the inferomedial right frontal lobe with mild edema, a 2 mm mildly enhancing cortical lesion of the inferolateral left frontal lobe and a intrinsically T1 hyperintense lesion of the left lateral pons measuring 7 mm with surrounding edema.  He had 4 lesions in total, but 3 were less than 1 cm.  He was treated with surgical resection of the larger lesion and stereotactic radiation. He had recurrent neurologic symptoms with left-sided facial droop and left-sided weakness in September 2023 and was placed on dexamethasone  4 mg 3 times daily.  Brain revealed thin linear enhancement along the resection margin of the left temporal resection cavity, which may be postoperative, with additional small areas of somewhat more nodular enhancement.  Interval increase in the size of the right inferior frontal gyrus enhancing lesion with increased surrounding edema, mild local mass effect, and mild local midline shift. Stable to slightly decreased size of the left pontine lesion with decreased associated edema. Previously noted left inferior frontal gyrus lesion is no longer visualized. [name] slowly weaned him off dexamethasone .  MRI brain in December 2023 was stable.  He has had persistent left facial weakness.   MRI brain in March 2024 revealed increased size of a hemorrhagic left pons/middle cerebellar peduncle lesion with increased surrounding edema, similar versus slightly decreased size of a 13 x 10 mm enhancing lesion in the medial right frontal lobe, and mildly diminished intrinsic T1 hyperintensity of the left temporal resection site with linear enhancement along the lateral margin, probably  postsurgical/post radiation.  PET brain in April did not show hyperactivity, but more radiation necrosis, so these changes are more consistent with posttreatment changes.  MRI head in August revealed new punctate superior right frontal lobe enhancement  compatible with interval metastasis, interval regression of enhancement and edema in the left brachium pontis and pons. There was also a vasogenic edema at a pre-existing right frontal metastasis with underlying lesion enhancement remaining stable.   In December 2024 a repeat MRI brain looked good and the punctate lesion seen on the last scan has resolved. MRI brain in April 2025 revealed continued stable treated metastases of the left brainstem, right inferior frontal gyrus, left temporal lobe resection site. Additional punctate enhancement in the central left cerebellum, intermittently visible over this series of exams, and associated with a rounded 8 mm area of FLAIR hyperintensity were stable since August 2024, although larger than in 2023. This is most likely an additional tiny treated metastasis. No new metastatic disease identified. Repeat MRI in October, 2025 was stable and will be repeated in April, 2026.   Plan: I will refill his hydrocodone  5-325 mg today for pain management. His day 1 cycle 24 of nivolumab  is scheduled on 06/01/2024. He has a WBC of 4.0, low hemoglobin of 12.3 down from 12.6, and platelet count of 231,000. His CMP is normal other than a low total protein of 6.2 down from 6.5. His TSH and T4 are pending and He will return in 4 weeks to see our PA Kelii with CBC, CMP, TSH, and T4. His last scans were in July, 2025. I will see him back in 8 weeks with CBC, CMP, TSH, T4 and repeat CT chest, abdomen, and pelvis. The patient understands the plans discussed today and is in agreement with them.  He knows to contact our office if he develops concerns prior to his next appointment.  I provided 15 minutes of face-to-face time during this encounter and > 50% was spent counseling as documented under my assessment and plan.   Eleanor Bach, FNP-BC [facility] [place] - A DEPT OF [facility] [address] [place] [address] Dept:  [phone] Dept Fax: [phone]   No orders of the defined types were placed in this encounter.   CHIEF COMPLAINT:  CC: Metastatic melanoma  Current Treatment: Maintenance nivolumab  every 4 weeks  HISTORY OF PRESENT ILLNESS:   Oncology History  Melanoma of skin (HCC)  12/01/2019 Initial Diagnosis   Melanoma of skin (HCC)   12/05/2019 Cancer Staging   Staging form: Melanoma of the Skin, AJCC 8th Edition - Clinical stage from 12/05/2019: Stage IIA (cT3a, cN0, cM0) - Signed by Cornelius Wanda VEAR, MD on 05/30/2020   02/18/2021 Genetic Testing   No pathogenic variants detected in [facility] Multi-Cancer +RNA Panel.  Variants of uncertain significance detected in APC (c.681C>G (p.Asp227Glu)), CDKN1C (c.610C>G (p.Pro204Ala)), and FH (c.1151C>T (p.Ala384Val)).  The report date is February 18, 2021.    The Multi-Cancer + RNA Panel offered by [facility] includes sequencing and/or deletion/duplication analysis of the following 84 genes:  AIP*, ALK, APC*, ATM*, AXIN2*, BAP1*, BARD1*, BLM*, BMPR1A*, BRCA1*, BRCA2*, BRIP1*, CASR, CDC73*, CDH1*, CDK4, CDKN1B*, CDKN1C*, CDKN2A, CEBPA, CHEK2*, CTNNA1*, DICER1*, DIS3L2*, EGFR, EPCAM, FH*, FLCN*, GATA2*, GPC3, GREM1, HOXB13, HRAS, KIT, MAX*, MEN1*, MET, MITF, MLH1*, MSH2*, MSH3*, MSH6*, MUTYH*, NBN*, NF1*, NF2*, NTHL1*, PALB2*, PDGFRA, PHOX2B, PMS2*, POLD1*, POLE*, POT1*, PRKAR1A*, PTCH1*, PTEN*, RAD50*, RAD51C*, RAD51D*, RB1*, RECQL4, RET, RUNX1*, SDHA*, SDHAF2*, SDHB*, SDHC*, SDHD*, SMAD4*, SMARCA4*,  SMARCB1*, SMARCE1*, STK11*, SUFU*, TERC, TERT, TMEM127*, Tp53*, TSC1*, TSC2*, VHL*, WRN*, and WT1.  RNA analysis is performed for * genes.   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 (HCC)  12/19/2021 Initial Diagnosis   Malignant melanoma  metastatic to brain (HCC)   01/30/2022 - 02/20/2022 Chemotherapy   Patient is on Treatment Plan : MELANOMA Nivolumab  + Ipilimumab  (1/3) q21d / Nivolumab  q28d     01/30/2022 -  Chemotherapy   Patient is on Treatment Plan : MELANOMA Nivolumab  (480) q28d     Malignant melanoma metastatic to lung (HCC)  12/19/2021 Initial Diagnosis   Malignant melanoma metastatic to lung (HCC)   01/30/2022 - 02/20/2022 Chemotherapy   Patient is on Treatment Plan : MELANOMA Nivolumab  + Ipilimumab  (1/3) q21d / Nivolumab  q28d     01/30/2022 -  Chemotherapy   Patient is on Treatment Plan : MELANOMA Nivolumab  (480) q28d     03/24/2022 Imaging   CTA chest:  IMPRESSION:  1. Small nonocclusive segmental level pulmonary embolism in the  inferior LEFT upper lobe.  2. Marked bronchial wall thickening with areas of patchy basilar  opacities and new ground-glass opacities in the upper lobes.  Constellation of findings may be infectious bronchiolitis/bronchitis  though the possibility of drug related changes in the setting of  immunotherapy are also considered. Furthermore, given basilar  predominance and some material in basilar bronchial structures would  correlate with any risk factors for or history of aspiration.  3. Response to therapy of bilateral metastatic lesions. Some lymph  nodes in the chest with interval increase in size are equivocal at  this time, potentially reactive, but warrant attention on subsequent  imaging.    07/22/2022 Imaging   CT chest, abdomen and pelvis:  IMPRESSION:  1. Interval positive response to therapy. Resolved subcarinal and  bilateral hilar lymphadenopathy. Stable to decreased left pulmonary  nodules. No new or progressive metastatic disease in the chest,  abdomen or pelvis.  2. One vessel coronary atherosclerosis.  3. Aortic Atherosclerosis (ICD10-I70.0).    Malignant neoplasm metastatic to liver (HCC)  12/19/2021 Initial Diagnosis   Malignant neoplasm metastatic to liver  (HCC)   01/30/2022 - 02/20/2022 Chemotherapy   Patient is on Treatment Plan : MELANOMA Nivolumab  + Ipilimumab  (1/3) q21d / Nivolumab  q28d     01/30/2022 -  Chemotherapy   Patient is on Treatment Plan : MELANOMA Nivolumab  (480) q28d     07/22/2022 Imaging   CT chest, abdomen and pelvis:  IMPRESSION:  1. Interval positive response to therapy. Resolved subcarinal and  bilateral hilar lymphadenopathy. Stable to decreased left pulmonary  nodules. No new or progressive metastatic disease in the chest,  abdomen or pelvis.  2. One vessel coronary atherosclerosis.  3. Aortic Atherosclerosis (ICD10-I70.0).        INTERVAL HISTORY:  Xavier Jones is here today for repeat clinical assessment of his metastatic malignant melanoma. Patient states that he feels well and has no complaints of pain. I will refill his hydrocodone  5-325 mg today for pain management. His day 1 cycle 24 of nivolumab  is scheduled on 06/01/2024. He has a WBC of 4.0, low hemoglobin of 12.3 down from 12.6, and platelet count of 231,000. His CMP is normal other than a low total protein of 6.2  down from 6.5. His TSH and T4 are pending and He will return in 4 weeks to see our PA Kelii with CBC, CMP, TSH, and T4. His last scans were in July, 2025. I will see him back in 8 weeks with CBC, CMP, TSH, T4 and repeat CT chest, abdomen, and pelvis. He denies fever, chills, night sweats, or other signs of infection. He denies cardiorespiratory and gastrointestinal issues. His appetite is good and His weight has decreased 2 pounds over last 4 weeks.  REVIEW OF SYSTEMS:  Review of Systems  Constitutional:  Negative for appetite change, chills, fatigue, fever and unexpected weight change.  HENT:  Negative.  Negative for lump/mass, mouth sores and sore throat.   Eyes: Negative.   Respiratory: Negative.  Negative for chest tightness, cough, hemoptysis, shortness of breath and wheezing.   Cardiovascular: Negative.  Negative for chest pain, leg swelling and  palpitations.  Gastrointestinal: Negative.  Negative for abdominal distention, abdominal pain, blood in stool, constipation, diarrhea, nausea and vomiting.  Endocrine: Negative.  Negative for hot flashes.  Genitourinary: Negative.  Negative for difficulty urinating, dysuria, frequency and hematuria.   Musculoskeletal:  Positive for arthralgias (hands). Negative for back pain, flank pain, gait problem, myalgias and neck pain.       Right leg pain   Skin: Negative.  Negative for itching, rash and wound.  Neurological:  Positive for dizziness and light-headedness. Negative for extremity weakness, gait problem, headaches, numbness, seizures and speech difficulty.  Hematological: Negative.  Negative for adenopathy. Does not bruise/bleed easily.  Psychiatric/Behavioral: Negative.  Negative for depression and sleep disturbance. The patient is not nervous/anxious.      VITALS:   Blood pressure 114/75, pulse 62, temperature 98.1 F (36.7 C), temperature source Oral, resp. rate 16, height 5' 9.5 (1.765 m), weight 169 lb 9.6 oz (76.9 kg), SpO2 100%.  Wt Readings from Last 3 Encounters:  06/01/24 169 lb 9.6 oz (76.9 kg)  05/05/24 171 lb 3.2 oz (77.7 kg)  04/28/24 171 lb 11.2 oz (77.9 kg)    Body mass index is 24.69 kg/m.  Performance status (ECOG): 1 - Symptomatic but completely ambulatory  PHYSICAL EXAM:   Physical Exam Vitals and nursing note reviewed.  Constitutional:      General: He is not in acute distress.    Appearance: Normal appearance. He is normal weight.  HENT:     Head: Normocephalic and atraumatic.     Right Ear: Tympanic membrane, ear canal and external ear normal.     Left Ear: Tympanic membrane, ear canal and external ear normal.     Nose: Nose normal.     Mouth/Throat:     Mouth: Mucous membranes are moist.     Pharynx: Oropharynx is clear. No oropharyngeal exudate or posterior oropharyngeal erythema.  Eyes:     General: No scleral icterus.    Extraocular  Movements: Extraocular movements intact.     Conjunctiva/sclera: Conjunctivae normal.     Pupils: Pupils are equal, round, and reactive to light.  Neck:     Comments: Swelling and prominence of the left clavicle where he had a prior fracture.  Cardiovascular:     Rate and Rhythm: Normal rate and regular rhythm.     Pulses: Normal pulses.     Heart sounds: Normal heart sounds. No murmur heard.    No friction rub. No gallop.  Pulmonary:     Effort: Pulmonary effort is normal.     Breath sounds: Normal breath sounds. No wheezing, rhonchi  or rales.  Abdominal:     General: Bowel sounds are normal. There is no distension.     Palpations: Abdomen is soft. There is no hepatomegaly, splenomegaly or mass.     Tenderness: There is no abdominal tenderness.  Musculoskeletal:        General: Normal range of motion.     Cervical back: Normal range of motion and neck supple. No tenderness.     Right lower leg: No edema.     Left lower leg: No edema.  Lymphadenopathy:     Cervical: No cervical adenopathy.     Upper Body:     Right upper body: No supraclavicular or axillary adenopathy.     Left upper body: No supraclavicular or axillary adenopathy.     Lower Body: No right inguinal adenopathy. No left inguinal adenopathy.     Comments: Small node in the right axilla measuring 1-2cm  Skin:    General: Skin is warm and dry.     Coloration: Skin is not jaundiced.     Findings: No rash.     Comments: No new subcutaneous masses or suspicious skin lesions. Vitiligo of all extremities and neck.  Neurological:     General: No focal deficit present.     Mental Status: He is alert and oriented to person, place, and time. Mental status is at baseline.     Cranial Nerves: No cranial nerve deficit.     Comments: Left facial droop  Psychiatric:        Mood and Affect: Mood normal.        Behavior: Behavior normal.        Thought Content: Thought content normal.        Judgment: Judgment normal.      LABS:      Latest Ref Rng & Units 06/01/2024    9:23 AM 05/05/2024    8:58 AM 04/07/2024    9:01 AM  CBC  WBC 4.0 - 10.5 K/uL 4.0  3.8  4.6   Hemoglobin 13.0 - 17.0 g/dL 87.6  87.3  87.3   Hematocrit 39.0 - 52.0 % 36.7  37.2  37.7   Platelets 150 - 400 K/uL 231  233  249       Latest Ref Rng & Units 06/01/2024    9:23 AM 05/05/2024    8:58 AM 04/07/2024    9:01 AM  CMP  Glucose 70 - 99 mg/dL 827  865  98   BUN 6 - 20 mg/dL 15  13  20    Creatinine 0.61 - 1.24 mg/dL 9.08  9.12  9.11   Sodium 135 - 145 mmol/L 141  139  141   Potassium 3.5 - 5.1 mmol/L 4.2  4.4  4.4   Chloride 98 - 111 mmol/L 104  104  104   CO2 22 - 32 mmol/L 27  27  26    Calcium 8.9 - 10.3 mg/dL 9.4  9.3  9.4   Total Protein 6.5 - 8.1 g/dL 6.2  6.5  6.9   Total Bilirubin 0.0 - 1.2 mg/dL 0.3  0.4  0.4   Alkaline Phos 38 - 126 U/L 58  63  73   AST 15 - 41 U/L 21  21  20    ALT 0 - 44 U/L 13  14  11     Lab Results  Component Value Date   TSH 1.040 06/01/2024   T4TOTAL 6.9 06/01/2024   FREET4 1.09 01/13/2024   Lab Results  Component  Value Date   TOTALPROTELP 6.7 08/27/2023   TOTALPROTELP 6.7 08/27/2023   ALBUMINELP 3.9 08/27/2023   A1GS 0.2 08/27/2023   A2GS 0.7 08/27/2023   BETS 1.0 08/27/2023   GAMS 0.8 08/27/2023   MSPIKE Not Observed 08/27/2023   SPEI Comment 08/27/2023   Lab Results  Component Value Date   TIBC 382 06/02/2023   TIBC 297 05/21/2022   FERRITIN 22 (L) 06/02/2023   FERRITIN 41 05/21/2022   IRONPCTSAT 23 06/02/2023   IRONPCTSAT 18 05/21/2022   Lab Results  Component Value Date   LDH 139 05/21/2022   LDH 145 12/19/2021    STUDIES:  EXAM: 04/27/2024 MRI BRAIN WITH AND WITHOUT CONTRAST IMPRESSION: 1. Stable treated brain metastases in the left pons, left cerebellum, and right frontal lobe. 2. Stable appearance of lateral left temporal lobe resection site. 3. No evidence of new intracranial lesion. 4. No acute findings.  HISTORY:   Past Medical History:   Diagnosis Date   Acute pulmonary embolism without acute cor pulmonale (HCC) 05/01/2022   Anemia 05/20/2022   Chemotherapy follow-up examination 04/21/2023   Degenerative disc disease, cervical 08/27/2023   Depression    mild   Family history of breast cancer 01/22/2021   Family history of prostate cancer 01/22/2021   Genetic testing 02/22/2021   No pathogenic variants detected in [facility] Multi-Cancer +RNA Panel.  Variants of uncertain significance detected in APC (c.681C>G (p.Asp227Glu)), CDKN1C (c.610C>G (p.Pro204Ala)), and FH (c.1151C>T (p.Ala384Val)).  The report date is February 18, 2021.       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) 12/19/2021   Melanoma of skin (HCC) 12/01/2019   Multiple dysplastic nevi 05/30/2020   Peripheral neuropathy 05/09/2023   Port-A-Cath in place 07/28/2022   Pruritic rash 02/18/2022   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   Right shoulder pain 02/18/2022   Skin cancer    melanoma    Past Surgical History:  Procedure Laterality Date   APPLICATION OF CRANIAL NAVIGATION N/A 01/03/2022   Procedure: APPLICATION OF CRANIAL NAVIGATION;  Surgeon: Lanis Pupa, MD;  Location: [facility] OR;  Service: Neurosurgery;  Laterality: N/A;   CRANIOTOMY Left 01/03/2022   Procedure: Stereotactic Left temporal craniotomy for resection of tumor;  Surgeon: Lanis Pupa, MD;  Location: [facility] OR;  Service: Neurosurgery;   Laterality: Left;   MELANOMA EXCISION WITH SENTINEL LYMPH NODE BIOPSY Right 2021   SEPTOPLASTY  05/13/2023   Dr Honor   SPINAL FUSION W/ LUQUE UNIT ROD  2003    Family History  Problem Relation Age of Onset   Prostate cancer Father        dx 41s   Breast cancer Maternal Aunt        dx after 72; bilateral mastectomy   Stomach cancer Maternal Grandfather        dx 21s   Breast cancer Cousin 74       maternal male cousin; mets    Social History:  reports that he quit smoking about 26  years ago. His smoking use included cigarettes. He has never used smokeless tobacco. He reports current alcohol use of about 14.0 standard drinks of alcohol per week. He reports that he does not currently use drugs.The patient is alone today.  Allergies:  Allergies  Allergen Reactions   Sulfa Antibiotics Rash   Testosterone  Itching    Gel    Current Medications: Current Outpatient Medications  Medication Sig Dispense Refill   acebutolol  (SECTRAL ) 200 MG capsule TAKE ONE CAPSULE BY MOUTH EVERY DAY 90 capsule 3   acyclovir  (ZOVIRAX ) 400 MG tablet Take 400 mg by mouth 3 (three) times daily.     amitriptyline  (ELAVIL ) 25 MG tablet Take 50 mg by mouth at bedtime.     ergocalciferol (VITAMIN D2) 1.25 MG (50000 UT) capsule Take 50,000 Units by mouth every Wednesday.     folic acid  (FOLVITE ) 1 MG tablet TAKE ONE TABLET BY MOUTH DAILY 30 tablet 5   HYDROcodone -acetaminophen  (NORCO/VICODIN) 5-325 MG tablet Take 1 tablet by mouth every 4 (four) hours as needed for moderate pain (pain score 4-6). 150 tablet 0   hydrOXYzine  (ATARAX ) 25 MG tablet TAKE ONE TABLET BY MOUTH 3 TIMES A DAY AS NEEDED FOR ITCHING 60 tablet 3   lidocaine -prilocaine  (EMLA ) cream Apply 1 Application topically as needed. 30 g 0   lisinopril  (ZESTRIL ) 20 MG tablet TAKE ONE TABLET BY MOUTH EVERY DAY 30 tablet 1   pregabalin  (LYRICA ) 100 MG capsule Take 1 capsule (100 mg total) by mouth 3 (three) times daily. 90 capsule 5   XARELTO  20 MG TABS  tablet TAKE ONE TABLET BY MOUTH EVERY DAY WITH SUPPER 30 tablet 3   No current facility-administered medications for this visit.    I,Jasmine M Lassiter,acting as a scribe for Wanda VEAR Cornish, MD.,have documented all relevant documentation on the behalf of Wanda VEAR Cornish, MD,as directed by  Wanda VEAR Cornish, MD while in the presence of Wanda VEAR Cornish, MD.

## 2024-06-02 LAB — T4: T4, Total: 6.9 ug/dL (ref 4.5–12.0)

## 2024-06-06 ENCOUNTER — Other Ambulatory Visit: Payer: Self-pay | Admitting: Hematology and Oncology

## 2024-06-06 DIAGNOSIS — I10 Essential (primary) hypertension: Secondary | ICD-10-CM

## 2024-06-11 ENCOUNTER — Encounter: Payer: Self-pay | Admitting: Oncology

## 2024-06-28 ENCOUNTER — Inpatient Hospital Stay: Admitting: Hematology and Oncology

## 2024-06-28 ENCOUNTER — Encounter: Payer: Self-pay | Admitting: Hematology and Oncology

## 2024-06-28 ENCOUNTER — Inpatient Hospital Stay

## 2024-06-28 ENCOUNTER — Other Ambulatory Visit: Payer: Self-pay

## 2024-06-28 ENCOUNTER — Inpatient Hospital Stay: Attending: Hematology and Oncology

## 2024-06-28 VITALS — BP 120/84 | HR 69 | Temp 99.1°F | Resp 16 | Ht 69.5 in | Wt 168.6 lb

## 2024-06-28 DIAGNOSIS — Z5112 Encounter for antineoplastic immunotherapy: Secondary | ICD-10-CM | POA: Insufficient documentation

## 2024-06-28 DIAGNOSIS — M79604 Pain in right leg: Secondary | ICD-10-CM

## 2024-06-28 DIAGNOSIS — Z79620 Long term (current) use of immunosuppressive biologic: Secondary | ICD-10-CM | POA: Insufficient documentation

## 2024-06-28 DIAGNOSIS — C7801 Secondary malignant neoplasm of right lung: Secondary | ICD-10-CM | POA: Diagnosis not present

## 2024-06-28 DIAGNOSIS — C7800 Secondary malignant neoplasm of unspecified lung: Secondary | ICD-10-CM

## 2024-06-28 DIAGNOSIS — M25552 Pain in left hip: Secondary | ICD-10-CM

## 2024-06-28 DIAGNOSIS — C7931 Secondary malignant neoplasm of brain: Secondary | ICD-10-CM | POA: Insufficient documentation

## 2024-06-28 DIAGNOSIS — C439 Malignant melanoma of skin, unspecified: Secondary | ICD-10-CM

## 2024-06-28 DIAGNOSIS — M5126 Other intervertebral disc displacement, lumbar region: Secondary | ICD-10-CM

## 2024-06-28 DIAGNOSIS — C787 Secondary malignant neoplasm of liver and intrahepatic bile duct: Secondary | ICD-10-CM | POA: Diagnosis not present

## 2024-06-28 DIAGNOSIS — C7802 Secondary malignant neoplasm of left lung: Secondary | ICD-10-CM | POA: Diagnosis not present

## 2024-06-28 DIAGNOSIS — C4361 Malignant melanoma of right upper limb, including shoulder: Secondary | ICD-10-CM | POA: Insufficient documentation

## 2024-06-28 DIAGNOSIS — Z86711 Personal history of pulmonary embolism: Secondary | ICD-10-CM | POA: Insufficient documentation

## 2024-06-28 DIAGNOSIS — G629 Polyneuropathy, unspecified: Secondary | ICD-10-CM

## 2024-06-28 LAB — CMP (CANCER CENTER ONLY)
ALT: 7 U/L (ref 0–44)
AST: 20 U/L (ref 15–41)
Albumin: 4.5 g/dL (ref 3.5–5.0)
Alkaline Phosphatase: 62 U/L (ref 38–126)
Anion gap: 8 (ref 5–15)
BUN: 16 mg/dL (ref 6–20)
CO2: 28 mmol/L (ref 22–32)
Calcium: 9.4 mg/dL (ref 8.9–10.3)
Chloride: 103 mmol/L (ref 98–111)
Creatinine: 0.92 mg/dL (ref 0.61–1.24)
GFR, Estimated: >60 mL/min
Glucose, Bld: 135 mg/dL — ABNORMAL HIGH (ref 70–99)
Potassium: 4.3 mmol/L (ref 3.5–5.1)
Sodium: 139 mmol/L (ref 135–145)
Total Bilirubin: 0.4 mg/dL (ref 0.0–1.2)
Total Protein: 6.5 g/dL (ref 6.5–8.1)

## 2024-06-28 LAB — CBC WITH DIFFERENTIAL (CANCER CENTER ONLY)
Abs Immature Granulocytes: 0 K/uL (ref 0.00–0.07)
Basophils Absolute: 0 K/uL (ref 0.0–0.1)
Basophils Relative: 1 %
Eosinophils Absolute: 0.1 K/uL (ref 0.0–0.5)
Eosinophils Relative: 3 %
HCT: 36.5 % — ABNORMAL LOW (ref 39.0–52.0)
Hemoglobin: 12.3 g/dL — ABNORMAL LOW (ref 13.0–17.0)
Immature Granulocytes: 0 %
Lymphocytes Relative: 31 %
Lymphs Abs: 1.3 K/uL (ref 0.7–4.0)
MCH: 31.3 pg (ref 26.0–34.0)
MCHC: 33.7 g/dL (ref 30.0–36.0)
MCV: 92.9 fL (ref 80.0–100.0)
Monocytes Absolute: 0.4 K/uL (ref 0.1–1.0)
Monocytes Relative: 9 %
Neutro Abs: 2.3 K/uL (ref 1.7–7.7)
Neutrophils Relative %: 56 %
Platelet Count: 253 K/uL (ref 150–400)
RBC: 3.93 MIL/uL — ABNORMAL LOW (ref 4.22–5.81)
RDW: 12.4 % (ref 11.5–15.5)
WBC Count: 4.2 K/uL (ref 4.0–10.5)
nRBC: 0 % (ref 0.0–0.2)

## 2024-06-28 LAB — TSH: TSH: 1.02 u[IU]/mL (ref 0.350–4.500)

## 2024-06-28 MED ORDER — SODIUM CHLORIDE 0.9 % IV SOLN: Freq: Once | INTRAVENOUS | Status: AC

## 2024-06-28 MED ORDER — SODIUM CHLORIDE 0.9 % IV SOLN
480.0000 mg | Freq: Once | INTRAVENOUS | Status: AC
  Administered 2024-06-28: 480 mg via INTRAVENOUS
  Filled 2024-06-28: qty 48

## 2024-06-28 MED ORDER — HYDROCODONE-ACETAMINOPHEN 5-325 MG PO TABS: 1.0000 | ORAL_TABLET | ORAL | 0 refills | Status: DC | PRN

## 2024-06-28 NOTE — Assessment & Plan Note (Addendum)
 Metastatic melanoma to lung diagnosed in found on routine CT imaging in May 2023.  There was over 20 bilateral pulmonary nodules which increased in size by the time treatment was started. The largest 1 nodule is up to 2.3 cm in diameter.  There were also 4 subcutaneous nodules of the back.  Soft tissue needle core biopsy of a right back subcutaneous nodule revealed metastatic malignant melanoma.  He was initially treated with 4 cycles of ipilimumab /nivolumab  with a good response, then placed on maintenance nivolumab .    He continues nivolumab  every 4 weeks.  Most recent CT imaging in July 2025 revealed a stable subpleural nodule of the left lower lobe, otherwise no metastatic disease was seen.  He is doing well at this time, so we will proceed with nivolumab  today.  We will plan to see him back in 4 weeks with a CBC, comprehensive metabolic panel, TSH and CT chest, abdomen and pelvis to reassess his disease baseline prior to his next cycle of nivolumab .

## 2024-06-28 NOTE — Progress Notes (Signed)
 " [facility]  [address] [place],  [place]  [address] [phone]  Clinic Day:  06/28/2024  Referring physician: [name], NP  ASSESSMENT & PLAN:   Assessment & Plan: Malignant melanoma metastatic to lung [facility]) Metastatic melanoma to lung diagnosed in found on routine CT imaging in May 2023.  There was over 20 bilateral pulmonary nodules which increased in size by the time treatment was started. The largest 1 nodule is up to 2.3 cm in diameter.  There were also 4 subcutaneous nodules of the back.  Soft tissue needle core biopsy of a right back subcutaneous nodule revealed metastatic malignant melanoma.  He was initially treated with 4 cycles of ipilimumab /nivolumab  with a good response, then placed on maintenance nivolumab .    He continues nivolumab  every 4 weeks.  Most recent CT imaging in July 2025 revealed a stable subpleural nodule of the left lower lobe, otherwise no metastatic disease was seen.  He is doing well at this time, so we will proceed with nivolumab  today.  We will plan to see him back in 4 weeks with a CBC, comprehensive metabolic panel, TSH and CT chest, abdomen and pelvis to reassess his disease baseline prior to his next cycle of nivolumab .  Malignant melanoma metastatic to brain [facility]) Brain metastasis diagnosed in June 2023.  PET scan was concerning for possible lesion in the left frontoparietal lobe.  MRI of the brain confirmed a 2.6 cm heterogeneous lesion of the left superior temporal gyrus with mild enhancement and extensive surrounding edema.  He also has an 8 mm cortical lesion of the inferomedial right frontal lobe with mild edema, a 2 mm mildly enhancing cortical lesion of the inferolateral left frontal lobe and a intrinsically T1 hyperintense lesion of the left lateral pons measuring 7 mm with surrounding edema. He was treated with preoperative stereotactic radiation.  He then underwent surgical resection of the larger lesion left temporal  lobe mass in June.   He had recurrent neurologic symptoms with left-sided facial droop and left-sided weakness in September 2023 and was placed on dexamethasone  briefly.  MRI brain was stable.  He began seeing [name] and as his left facial weakness had nearly resolved, he tapered him off dexamethasone .  He has continued to follow with [name] and radiation therapy.  Surveillance MRIs have been basically stable.  The patient later developed recurrent left facial weakness that has become permanent.  He also has had chronic lightheadedness, hearing loss, and tinnitus that has been going on since radiation to the head, which worsens with loud noise, and he works in a loud environment. He has seen a ENT who attributed this to radiation.  The patient is no longer working and denies progressive symptoms.  He is scheduled to see [name] again in April with repeat MRI brain.  [name], Breslow's depth 3.3 mm, Clark level IV, treated with wide local excision, diagnosed in May 2021.  There was still a question as to whether this represented a metastatic lesion, rather than a primary, based on the presence of 2 nodules and no epidermal involvement.  MRI head and CT chest, abdomen and pelvis in June 2021 were negative for metastatic disease.   No adjuvant therapy was recommended.  PET scan in September 2021 was negative for metastatic disease.    Unfortunately, he was found to have widespread metastatic disease, including to the brain, in June 2023.  He was initially treated with ipilimumab /nivolumab  for 4 cycles then placed on maintenance nivolumab  which he continues every 4 weeks.  History of pulmonary embolism History of small pulmonary embolism in September 2023.  He remains on anticoagulation with rivaroxaban  due to his increased risk of thrombosis due to malignancy.    The patient understands the plans discussed today and is in  agreement with them.  He knows to contact our office if he develops concerns prior to his next appointment.   I provided 20 minutes of face-to-face time during this encounter and > 50% was spent counseling as documented under my assessment and plan.    Ahmari Duerson A Chilton Sallade, PA-C  [facility] [place] - A DEPT OF [facility] [address] [place] [address] Dept: [phone] Dept Fax: [phone]   No orders of the defined types were placed in this encounter.     CHIEF COMPLAINT:  CC: Metastatic melanoma  Current Treatment: Nivolumab  every 4 weeks  HISTORY OF PRESENT ILLNESS:  Xavier Jones is a 53 y.o. male with a history of stage IIB (T3b N0 M0) malignant melanoma of the right posterior shoulder diagnosed in May 2021. He reported an enlarging nevus of the right shoulder for about a year.  It began to bleed, so he saw his dermatologist.  Biopsy revealed malignant melanoma, Breslow's depth 3.3 mm, Clark level IV, however, there was no overt attachment to the overlying epidermis, so a metastatic nodule could not be ruled out.  He had several other lesions removed at the time of biopsy.  Right lower lateral back shave revealed a dysplastic compound nevus with severe atypia, limited margins free.  Right lower medial back shave revealed dysplastic nevus with moderate to severe atypia, peripheral margin involved.  Left mid buttocks shave revealed dysplastic compound nevus with moderate atypia, limited margins free.  MRI head and CT chest, abdomen and pelvis in June were negative for metastatic disease. He underwent wide local excision of the right shoulder melanoma with Dr. Allana in June.  Pathology revealed residual dermal malignant melanoma, but margins were clear.  3 sentinel lymph nodes were negative for metastasis (0/3).  However, there was a question as to whether this represents a metastatic lesion, rather than a primary, based on the  presence of 2 nodules and no epidermal involvement.  He also had excision of the lower back lesion, which did not reveal malignancy. PET scan in September 2021 did not reveal any findings of hypermetabolic metastatic disease, there was low level activity felt to be postoperative changes in the skin/subcutaneous tissue of the right posterior shoulder, as well as likely degenerative hypermetabolism of the proximal, posterior right femur.  No adjuvant therapy was recommended.   Due to his personal and family history of malignancy, he was seen by our genetic counselor to discuss testing for hereditary cancer syndromes. [facility] Multi-Cancer +RNA Panel in August 2022 revealed variants of uncertain significance (VUS) of the APC, CDKN1C and FH gene.   Unfortunately, he developed metastatic disease in June 2023.  He had palpable subcutaneous nodules.  CT chest revealed 4 subcutaneous nodules in the back worrisome for metastatic disease. At least 20 pulmonary nodules were identified, involving all lobes of both lungs consistent with metastatic disease.  Biopsy of a subcutaneous nodule was positive for metastatic melanoma.  MRI head revealed four metastatic lesions as described with the largest within the left temporal lobe.  He saw radiation oncology and underwent preoperative stereotactic  radiosurgery.  He was referred to Dr. Lanis and underwent craniotomy with resection of the left temporal lobe mass in June.   He was then placed on palliative immunotherapy.  He received 4 cycles of nivolumab /ipilimumab  July to October 2023.  He had recurrent neurologic symptoms with left facial weakness in September 2023 and MRI showed left temporal resection cavity demonstrated thin linear enhancement along the resection margins, which may be postoperative, with additional small areas of somewhat  more nodular enhancement, which are nonspecific but could indicate recurrent disease.  Attention on follow-up was recommended.   Interval increase in the size of the right inferior frontal gyrus enhancing lesion with increased surrounding edema, mild local mass effect, and mild local midline shift. Stable to slightly decreased size of the left pontine lesion with decreased associated edema. Previously noted left inferior frontal gyrus lesion was no longer visualized.  No definite new lesion.  He was placed on dexamethasone  briefly and referred to [name] for his recommendations.  He had near resolution of his left facial weakness, so [name] had him taper off his dexamethasone .  He was also found to have a small left upper lobe pulmonary embolism when he developed increased cough and shortness of breath in September 2023, for which he continues rivaroxaban .  We recommended he continue anticoagulation indefinitely due to his increased risk of thrombosis from malignancy.  There was also bronchial wall thickening, areas of patchy basilar opacity and new ground glass opacity of the upper lobes consistent with infection versus inflammation due to immunotherapy.  He otherwise had a response to therapy in the bilateral metastatic pulmonary lesions.  He was treated with antibiotics in addition to dexamethasone  and rivaroxaban .  He was placed on maintenance nivolumab  in November 2023 every 4 weeks with a continued response.  His subcutaneous metastasis resolved with treatment.  He has continued to follow with radiation oncology and [name].  He later developed permanent left facial weakness, although surveillance MRIs brain have been stable. Most recent MRI brain in October revealed stable treated brain metastases in the left pons, left cerebellum, and right frontal lobe. Stable appearance of lateral left temporal lobe resection site.  No evidence of new intracranial lesion or other acute abnormality.  Surveillance CTs chest, abdomen and pelvis have remained stable. Most recent CT chest, abdomen and pelvis in July 2025 revealed a small  left lower lobe pulmonary nodule which was stable without evidence of progressive or new disease.  Oncology History  Melanoma of skin (HCC)  12/01/2019 Initial Diagnosis   Melanoma of skin (HCC)   12/05/2019 Cancer Staging   Staging form: Melanoma of the Skin, AJCC 8th Edition - Clinical stage from 12/05/2019: Stage IIA (cT3a, cN0, cM0) - Signed by Cornelius Wanda DEL, MD on 05/30/2020   02/18/2021 Genetic Testing   No pathogenic variants detected in [facility] Multi-Cancer +RNA Panel.  Variants of uncertain significance detected in APC (c.681C>G (p.Asp227Glu)), CDKN1C (c.610C>G (p.Pro204Ala)), and FH (c.1151C>T (p.Ala384Val)).  The report date is February 18, 2021.    The Multi-Cancer + RNA Panel offered by [facility] includes sequencing and/or deletion/duplication analysis of the following 84 genes:  AIP*, ALK, APC*, ATM*, AXIN2*, BAP1*, BARD1*, BLM*, BMPR1A*, BRCA1*, BRCA2*, BRIP1*, CASR, CDC73*, CDH1*, CDK4, CDKN1B*, CDKN1C*, CDKN2A, CEBPA, CHEK2*, CTNNA1*, DICER1*, DIS3L2*, EGFR, EPCAM, FH*, FLCN*, GATA2*, GPC3, GREM1, HOXB13, HRAS, KIT, MAX*, MEN1*, MET, MITF, MLH1*, MSH2*, MSH3*, MSH6*, MUTYH*, NBN*, NF1*, NF2*, NTHL1*, PALB2*, PDGFRA, PHOX2B, PMS2*, POLD1*, POLE*, POT1*, PRKAR1A*, PTCH1*, PTEN*, RAD50*, RAD51C*, RAD51D*, RB1*, RECQL4,  RET, RUNX1*, SDHA*, SDHAF2*, SDHB*, SDHC*, SDHD*, SMAD4*, SMARCA4*, SMARCB1*, SMARCE1*, STK11*, SUFU*, TERC, TERT, TMEM127*, Tp53*, TSC1*, TSC2*, VHL*, WRN*, and WT1.  RNA analysis is performed for * genes.   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 (HCC)   01/30/2022 - 02/20/2022 Chemotherapy   Patient is on Treatment Plan : MELANOMA Nivolumab  + Ipilimumab  (1/3) q21d /  Nivolumab  q28d     01/30/2022 -  Chemotherapy   Patient is on Treatment Plan : MELANOMA Nivolumab  (480) q28d     Malignant melanoma metastatic to lung (HCC)  12/19/2021 Initial Diagnosis   Malignant melanoma metastatic to lung (HCC)   01/30/2022 - 02/20/2022 Chemotherapy   Patient is on Treatment Plan : MELANOMA Nivolumab  + Ipilimumab  (1/3) q21d / Nivolumab  q28d     01/30/2022 -  Chemotherapy   Patient is on Treatment Plan : MELANOMA Nivolumab  (480) q28d     03/24/2022 Imaging   CTA chest:  IMPRESSION:  1. Small nonocclusive segmental level pulmonary embolism in the  inferior LEFT upper lobe.  2. Marked bronchial wall thickening with areas of patchy basilar  opacities and new ground-glass opacities in the upper lobes.  Constellation of findings may be infectious bronchiolitis/bronchitis  though the possibility of drug related changes in the setting of  immunotherapy are also considered. Furthermore, given basilar  predominance and some material in basilar bronchial structures would  correlate with any risk factors for or history of aspiration.  3. Response to therapy of bilateral metastatic lesions. Some lymph  nodes in the chest with interval increase in size are equivocal at  this time, potentially reactive, but warrant attention on subsequent  imaging.    07/22/2022 Imaging   CT chest, abdomen and pelvis:  IMPRESSION:  1. Interval positive response to therapy. Resolved subcarinal and  bilateral hilar lymphadenopathy. Stable to decreased left pulmonary  nodules. No new or progressive metastatic disease in the chest,  abdomen or pelvis.  2. One vessel coronary atherosclerosis.  3. Aortic Atherosclerosis (ICD10-I70.0).    Malignant neoplasm metastatic to liver (HCC)  12/19/2021 Initial Diagnosis   Malignant neoplasm metastatic to liver (HCC)   01/30/2022 - 02/20/2022 Chemotherapy   Patient is on Treatment Plan : MELANOMA Nivolumab  + Ipilimumab  (1/3) q21d / Nivolumab  q28d      01/30/2022 -  Chemotherapy   Patient is on Treatment Plan : MELANOMA Nivolumab  (480) q28d     07/22/2022 Imaging   CT chest, abdomen and pelvis:  IMPRESSION:  1. Interval positive response to therapy. Resolved subcarinal and  bilateral hilar lymphadenopathy. Stable to decreased left pulmonary  nodules. No new or progressive metastatic disease in the chest,  abdomen or pelvis.  2. One vessel coronary atherosclerosis.  3. Aortic Atherosclerosis (ICD10-I70.0).        INTERVAL HISTORY:   Xavier Jones is here today for repeat clinical assessment.  He reports increased itching without rash.  I advised him to use a good moisturizer to help with dry skin.  He also has hydroxyzine  to use as needed.  He continues to use eyedrops in his left eye.  He denies fevers or chills. He reports right foot burning and tingling pain.  He denies progressive neurologic  symptoms.  His appetite is decreased. His weight has been stable. He states he has been out of lisinopril  for about 2 weeks and his blood pressure is good, so he is not sure he needs it.  We can monitor his blood pressure and decide on this.  He is on Social Security disability now.  REVIEW OF SYSTEMS:   Review of Systems  Constitutional:  Positive for appetite change (decreased) and fatigue (mild). Negative for chills, diaphoresis, fever and unexpected weight change.  HENT:   Negative for lump/mass, mouth sores, sore throat and trouble swallowing.   Eyes:  Positive for eye problems (dryness left eye).  Respiratory:  Negative for cough and shortness of breath.   Cardiovascular:  Negative for chest pain and leg swelling.  Gastrointestinal:  Negative for abdominal pain, constipation, diarrhea, nausea and vomiting.  Genitourinary:  Negative for difficulty urinating, dysuria, frequency and hematuria.   Musculoskeletal:  Positive for arthralgias (right foot). Negative for back pain, gait problem and myalgias.  Skin:  Negative for itching, rash and wound.   Neurological:  Positive for numbness (left face). Negative for dizziness, extremity weakness, gait problem, headaches, light-headedness and seizures.  Hematological:  Negative for adenopathy.  Psychiatric/Behavioral:  Negative for depression and sleep disturbance. The patient is not nervous/anxious.      VITALS:   Blood pressure 120/84, pulse 69, temperature 99.1 F (37.3 C), temperature source Oral, resp. rate 16, height 5' 9.5 (1.765 m), weight 168 lb 9.6 oz (76.5 kg), SpO2 98%.  Wt Readings from Last 3 Encounters:  06/28/24 168 lb 9.6 oz (76.5 kg)  06/01/24 169 lb 9.6 oz (76.9 kg)  05/05/24 171 lb 3.2 oz (77.7 kg)    Body mass index is 24.54 kg/m.  Performance status (ECOG): 1 - Symptomatic but completely ambulatory    PHYSICAL EXAM:   Physical Exam Vitals and nursing note reviewed.  Constitutional:      General: He is not in acute distress.    Appearance: Normal appearance. He is normal weight.  HENT:     Head: Normocephalic and atraumatic.     Mouth/Throat:     Mouth: Mucous membranes are moist.     Pharynx: Oropharynx is clear. No oropharyngeal exudate or posterior oropharyngeal erythema.  Eyes:     General: No scleral icterus.    Extraocular Movements: Extraocular movements intact.     Conjunctiva/sclera: Conjunctivae normal.     Pupils: Pupils are equal, round, and reactive to light.  Cardiovascular:     Rate and Rhythm: Normal rate and regular rhythm.     Heart sounds: Normal heart sounds. No murmur heard.    No friction rub. No gallop.  Pulmonary:     Effort: Pulmonary effort is normal.     Breath sounds: Normal breath sounds. No wheezing, rhonchi or rales.  Abdominal:     General: Bowel sounds are normal. There is no distension.     Palpations: Abdomen is soft. There is no hepatomegaly, splenomegaly or mass.     Tenderness: There is no abdominal tenderness.  Musculoskeletal:        General: Normal range of motion.     Cervical back: Normal range of  motion and neck supple. No tenderness.     Right lower leg: No edema.     Left lower leg: No edema.  Lymphadenopathy:     Cervical: No cervical adenopathy.     Upper Body:     Right upper body: No supraclavicular or axillary adenopathy.  Left upper body: No supraclavicular or axillary adenopathy.     Lower Body: No right inguinal adenopathy. No left inguinal adenopathy.  Skin:    General: Skin is warm and dry.     Coloration: Skin is not jaundiced.     Findings: No rash.  Neurological:     Mental Status: He is alert and oriented to person, place, and time.     Cranial Nerves: Facial asymmetry (left facial weakness) present.     Sensory: Sensory deficit (decreased sensation left face) present.  Psychiatric:        Mood and Affect: Mood normal.        Behavior: Behavior normal.        Thought Content: Thought content normal.     LABS:      Latest Ref Rng & Units 06/28/2024    9:22 AM 06/01/2024    9:23 AM 05/05/2024    8:58 AM  CBC  WBC 4.0 - 10.5 K/uL 4.2  4.0  3.8   Hemoglobin 13.0 - 17.0 g/dL 87.6  87.6  87.3   Hematocrit 39.0 - 52.0 % 36.5  36.7  37.2   Platelets 150 - 400 K/uL 253  231  233       Latest Ref Rng & Units 06/28/2024    9:22 AM 06/01/2024    9:23 AM 05/05/2024    8:58 AM  CMP  Glucose 70 - 99 mg/dL 864  827  865   BUN 6 - 20 mg/dL 16  15  13    Creatinine 0.61 - 1.24 mg/dL 9.07  9.08  9.12   Sodium 135 - 145 mmol/L 139  141  139   Potassium 3.5 - 5.1 mmol/L 4.3  4.2  4.4   Chloride 98 - 111 mmol/L 103  104  104   CO2 22 - 32 mmol/L 28  27  27    Calcium 8.9 - 10.3 mg/dL 9.4  9.4  9.3   Total Protein 6.5 - 8.1 g/dL 6.5  6.2  6.5   Total Bilirubin 0.0 - 1.2 mg/dL 0.4  0.3  0.4   Alkaline Phos 38 - 126 U/L 62  58  63   AST 15 - 41 U/L 20  21  21    ALT 0 - 44 U/L 7  13  14       No results found for: CEA1, CEA / No results found for: CEA1, CEA No results found for: PSA No results found for: CAN199 No results found for: RJW874   Lab Results  Component Value Date   TOTALPROTELP 6.7 08/27/2023   TOTALPROTELP 6.7 08/27/2023   ALBUMINELP 3.9 08/27/2023   A1GS 0.2 08/27/2023   A2GS 0.7 08/27/2023   BETS 1.0 08/27/2023   GAMS 0.8 08/27/2023   MSPIKE Not Observed 08/27/2023   SPEI Comment 08/27/2023   Lab Results  Component Value Date   TIBC 382 06/02/2023   TIBC 297 05/21/2022   FERRITIN 22 (L) 06/02/2023   FERRITIN 41 05/21/2022   IRONPCTSAT 23 06/02/2023   IRONPCTSAT 18 05/21/2022   Lab Results  Component Value Date   LDH 139 05/21/2022   LDH 145 12/19/2021    STUDIES:   No results found.    HISTORY:   Past Medical History:  Diagnosis Date   Acute pulmonary embolism without acute cor pulmonale (HCC) 05/01/2022   Anemia 05/20/2022   Chemotherapy follow-up examination 04/21/2023   Degenerative disc disease, cervical 08/27/2023   Depression    mild  Family history of breast cancer 01/22/2021   Family history of prostate cancer 01/22/2021   Genetic testing 02/22/2021   No pathogenic variants detected in [facility] Multi-Cancer +RNA Panel.  Variants of uncertain significance detected in APC (c.681C>G (p.Asp227Glu)), CDKN1C (c.610C>G (p.Pro204Ala)), and FH (c.1151C>T (p.Ala384Val)).  The report date is February 18, 2021.       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) 12/19/2021   Melanoma of skin (HCC) 12/01/2019   Multiple dysplastic nevi 05/30/2020   Peripheral  neuropathy 05/09/2023   Port-A-Cath in place 07/28/2022   Pruritic rash 02/18/2022   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   Right shoulder pain 02/18/2022   Skin cancer    melanoma    Past Surgical History:  Procedure Laterality Date   APPLICATION OF CRANIAL NAVIGATION N/A 01/03/2022   Procedure: APPLICATION OF CRANIAL NAVIGATION;  Surgeon: Lanis Pupa, MD;  Location: [facility] OR;  Service: Neurosurgery;  Laterality: N/A;   CRANIOTOMY Left 01/03/2022   Procedure: Stereotactic Left temporal craniotomy for resection of tumor;  Surgeon: Lanis Pupa, MD;  [facility];  Service: Neurosurgery;  Laterality: Left;   MELANOMA EXCISION WITH SENTINEL LYMPH NODE BIOPSY Right 2021   SEPTOPLASTY  05/13/2023   Dr Honor   SPINAL FUSION W/ LUQUE UNIT ROD  2003    Family History  Problem Relation Age of Onset   Prostate cancer Father        dx 39s   Breast cancer Maternal Aunt        dx after 20; bilateral mastectomy   Stomach cancer Maternal Grandfather        dx 11s   Breast cancer Cousin 69       maternal male cousin; mets    Social History:  reports that he quit smoking about 27 years ago. His smoking use included cigarettes. He has never used smokeless tobacco. He reports current alcohol use of about 14.0 standard drinks of alcohol per week. He reports that he does not currently use drugs.The patient is alone today.  Allergies: Allergies[1]  Current Medications: Current Outpatient Medications  Medication Sig Dispense Refill   acebutolol  (SECTRAL ) 200 MG capsule TAKE ONE CAPSULE BY MOUTH EVERY DAY 90 capsule 3   acyclovir  (ZOVIRAX ) 400 MG tablet Take 400 mg by mouth 3 (three) times daily.     amitriptyline  (ELAVIL ) 25 MG tablet Take 50 mg by mouth at bedtime.     ergocalciferol (VITAMIN D2) 1.25 MG (50000 UT) capsule Take 50,000 Units by mouth every Wednesday.     folic acid  (FOLVITE ) 1 MG tablet TAKE ONE TABLET BY MOUTH DAILY 30 tablet 5    HYDROcodone -acetaminophen  (NORCO/VICODIN) 5-325 MG tablet Take 1 tablet by mouth every 4 (four) hours as needed for moderate pain (pain score 4-6). 150 tablet 0   hydrOXYzine  (ATARAX ) 25 MG tablet TAKE ONE TABLET BY MOUTH 3 TIMES A DAY AS NEEDED FOR ITCHING 60 tablet 3   lidocaine -prilocaine  (EMLA ) cream Apply 1 Application topically as needed.  30 g 0   lisinopril  (ZESTRIL ) 20 MG tablet TAKE ONE TABLET BY MOUTH EVERY DAY 30 tablet 1   pregabalin  (LYRICA ) 100 MG capsule Take 1 capsule (100 mg total) by mouth 3 (three) times daily. 90 capsule 5   XARELTO  20 MG TABS tablet TAKE ONE TABLET BY MOUTH EVERY DAY WITH SUPPER 30 tablet 3   No current facility-administered medications for this visit.             [1]  Allergies Allergen Reactions   Sulfa Antibiotics Rash   Testosterone  Itching    Gel   "

## 2024-06-28 NOTE — Assessment & Plan Note (Signed)
 History of small pulmonary embolism in September 2023.  He remains on anticoagulation with rivaroxaban  due to his increased risk of thrombosis due to malignancy.

## 2024-06-28 NOTE — Assessment & Plan Note (Signed)
 Brain metastasis diagnosed in June 2023.  PET scan was concerning for possible lesion in the left frontoparietal lobe.  MRI of the brain confirmed a 2.6 cm heterogeneous lesion of the left superior temporal gyrus with mild enhancement and extensive surrounding edema.  He also has an 8 mm cortical lesion of the inferomedial right frontal lobe with mild edema, a 2 mm mildly enhancing cortical lesion of the inferolateral left frontal lobe and a intrinsically T1 hyperintense lesion of the left lateral pons measuring 7 mm with surrounding edema. He was treated with preoperative stereotactic radiation.  He then underwent surgical resection of the larger lesion left temporal lobe mass in June.   He had recurrent neurologic symptoms with left-sided facial droop and left-sided weakness in September 2023 and was placed on dexamethasone  briefly.  MRI brain was stable.  He began seeing [name] and as his left facial weakness had nearly resolved, he tapered him off dexamethasone .  He has continued to follow with [name] and radiation therapy.  Surveillance MRIs have been basically stable.  The patient later developed recurrent left facial weakness that has become permanent.  He also has had chronic lightheadedness, hearing loss, and tinnitus that has been going on since radiation to the head, which worsens with loud noise, and he works in a loud environment. He has seen a ENT who attributed this to radiation.  The patient is no longer working and denies progressive symptoms.  He is scheduled to see [name] again in April with repeat MRI brain.

## 2024-06-28 NOTE — Assessment & Plan Note (Signed)
 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  There was still a question as to whether this represented a metastatic lesion, rather than a primary, based on the presence of 2 nodules and no epidermal involvement.  MRI head and CT chest, abdomen and pelvis in June 2021 were negative for metastatic disease.   No adjuvant therapy was recommended.  PET scan in September 2021 was negative for metastatic disease.    Unfortunately, he was found to have widespread metastatic disease, including to the brain, in June 2023.  He was initially treated with ipilimumab /nivolumab  for 4 cycles then placed on maintenance nivolumab  which he continues every 4 weeks.

## 2024-06-28 NOTE — Patient Instructions (Signed)
 Nivolumab Injection What is this medication? NIVOLUMAB (nye VOL ue mab) treats some types of cancer. It works by helping your immune system slow or stop the spread of cancer cells. It is a monoclonal antibody. This medicine may be used for other purposes; ask your health care provider or pharmacist if you have questions. COMMON BRAND NAME(S): Opdivo What should I tell my care team before I take this medication? They need to know if you have any of these conditions: Allogeneic stem cell transplant (uses someone else's stem cells) Autoimmune diseases, such as Crohn disease, ulcerative colitis, lupus History of chest radiation Nervous system problems, such as Guillain-Barre syndrome or myasthenia gravis Organ transplant An unusual or allergic reaction to nivolumab, other medications, foods, dyes, or preservatives Pregnant or trying to get pregnant Breast-feeding How should I use this medication? This medication is infused into a vein. It is given in a hospital or clinic setting. A special MedGuide will be given to you before each treatment. Be sure to read this information carefully each time. Talk to your care team about the use of this medication in children. While it may be prescribed for children as young as 12 years for selected conditions, precautions do apply. Overdosage: If you think you have taken too much of this medicine contact a poison control center or emergency room at once. NOTE: This medicine is only for you. Do not share this medicine with others. What if I miss a dose? Keep appointments for follow-up doses. It is important not to miss your dose. Call your care team if you are unable to keep an appointment. What may interact with this medication? Interactions have not been studied. This list may not describe all possible interactions. Give your health care provider a list of all the medicines, herbs, non-prescription drugs, or dietary supplements you use. Also tell them if you  smoke, drink alcohol, or use illegal drugs. Some items may interact with your medicine. What should I watch for while using this medication? Your condition will be monitored carefully while you are receiving this medication. You may need blood work while taking this medication. This medication may cause serious skin reactions. They can happen weeks to months after starting the medication. Contact your care team right away if you notice fevers or flu-like symptoms with a rash. The rash may be red or purple and then turn into blisters or peeling of the skin. You may also notice a red rash with swelling of the face, lips, or lymph nodes in your neck or under your arms. Tell your care team right away if you have any change in your eyesight. Talk to your care team if you are pregnant or think you might be pregnant. A negative pregnancy test is required before starting this medication. A reliable form of contraception is recommended while taking this medication and for 5 months after the last dose. Talk to your care team about effective forms of contraception. Do not breast-feed while taking this medication and for 5 months after the last dose. What side effects may I notice from receiving this medication? Side effects that you should report to your care team as soon as possible: Allergic reactions--skin rash, itching, hives, swelling of the face, lips, tongue, or throat Dry cough, shortness of breath or trouble breathing Eye pain, redness, irritation, or discharge with blurry or decreased vision Heart muscle inflammation--unusual weakness or fatigue, shortness of breath, chest pain, fast or irregular heartbeat, dizziness, swelling of the ankles, feet, or hands Hormone  gland problems--headache, sensitivity to light, unusual weakness or fatigue, dizziness, fast or irregular heartbeat, increased sensitivity to cold or heat, excessive sweating, constipation, hair loss, increased thirst or amount of urine,  tremors or shaking, irritability Infusion reactions--chest pain, shortness of breath or trouble breathing, feeling faint or lightheaded Kidney injury (glomerulonephritis)--decrease in the amount of urine, red or dark brown urine, foamy or bubbly urine, swelling of the ankles, hands, or feet Liver injury--right upper belly pain, loss of appetite, nausea, light-colored stool, dark yellow or brown urine, yellowing skin or eyes, unusual weakness or fatigue Pain, tingling, or numbness in the hands or feet, muscle weakness, change in vision, confusion or trouble speaking, loss of balance or coordination, trouble walking, seizures Rash, fever, and swollen lymph nodes Redness, blistering, peeling, or loosening of the skin, including inside the mouth Sudden or severe stomach pain, bloody diarrhea, fever, nausea, vomiting Side effects that usually do not require medical attention (report these to your care team if they continue or are bothersome): Bone, joint, or muscle pain Diarrhea Fatigue Loss of appetite Nausea Skin rash This list may not describe all possible side effects. Call your doctor for medical advice about side effects. You may report side effects to FDA at 1-800-FDA-1088. Where should I keep my medication? This medication is given in a hospital or clinic. It will not be stored at home. NOTE: This sheet is a summary. It may not cover all possible information. If you have questions about this medicine, talk to your doctor, pharmacist, or health care provider.  2024 Elsevier/Gold Standard (2021-10-21 00:00:00)

## 2024-06-29 LAB — T4: T4, Total: 6.2 ug/dL (ref 4.5–12.0)

## 2024-07-04 ENCOUNTER — Encounter: Payer: Self-pay | Admitting: Oncology

## 2024-07-14 ENCOUNTER — Encounter: Payer: Self-pay | Admitting: Oncology

## 2024-07-21 ENCOUNTER — Inpatient Hospital Stay: Attending: Hematology and Oncology

## 2024-07-21 ENCOUNTER — Ambulatory Visit (INDEPENDENT_AMBULATORY_CARE_PROVIDER_SITE_OTHER)
Admission: RE | Admit: 2024-07-21 | Discharge: 2024-07-21 | Disposition: A | Discharge status: Home / Self Care | Source: Ambulatory Visit | Attending: Oncology | Admitting: Oncology

## 2024-07-21 DIAGNOSIS — C787 Secondary malignant neoplasm of liver and intrahepatic bile duct: Secondary | ICD-10-CM | POA: Insufficient documentation

## 2024-07-21 DIAGNOSIS — C7800 Secondary malignant neoplasm of unspecified lung: Secondary | ICD-10-CM | POA: Diagnosis not present

## 2024-07-21 DIAGNOSIS — C7801 Secondary malignant neoplasm of right lung: Secondary | ICD-10-CM | POA: Insufficient documentation

## 2024-07-21 DIAGNOSIS — C439 Malignant melanoma of skin, unspecified: Secondary | ICD-10-CM

## 2024-07-21 DIAGNOSIS — C7931 Secondary malignant neoplasm of brain: Secondary | ICD-10-CM | POA: Insufficient documentation

## 2024-07-21 DIAGNOSIS — C7802 Secondary malignant neoplasm of left lung: Secondary | ICD-10-CM | POA: Insufficient documentation

## 2024-07-21 DIAGNOSIS — C4361 Malignant melanoma of right upper limb, including shoulder: Secondary | ICD-10-CM | POA: Insufficient documentation

## 2024-07-21 DIAGNOSIS — Z5112 Encounter for antineoplastic immunotherapy: Secondary | ICD-10-CM | POA: Insufficient documentation

## 2024-07-21 DIAGNOSIS — Z79620 Long term (current) use of immunosuppressive biologic: Secondary | ICD-10-CM | POA: Insufficient documentation

## 2024-07-21 LAB — CBC WITH DIFFERENTIAL (CANCER CENTER ONLY)
Abs Immature Granulocytes: 0.01 K/uL (ref 0.00–0.07)
Basophils Absolute: 0.1 K/uL (ref 0.0–0.1)
Basophils Relative: 1 %
Eosinophils Absolute: 0.2 K/uL (ref 0.0–0.5)
Eosinophils Relative: 4 %
HCT: 37.6 % — ABNORMAL LOW (ref 39.0–52.0)
Hemoglobin: 12.7 g/dL — ABNORMAL LOW (ref 13.0–17.0)
Immature Granulocytes: 0 %
Lymphocytes Relative: 36 %
Lymphs Abs: 1.6 K/uL (ref 0.7–4.0)
MCH: 31.7 pg (ref 26.0–34.0)
MCHC: 33.8 g/dL (ref 30.0–36.0)
MCV: 93.8 fL (ref 80.0–100.0)
Monocytes Absolute: 0.4 K/uL (ref 0.1–1.0)
Monocytes Relative: 8 %
Neutro Abs: 2.3 K/uL (ref 1.7–7.7)
Neutrophils Relative %: 51 %
Platelet Count: 261 K/uL (ref 150–400)
RBC: 4.01 MIL/uL — ABNORMAL LOW (ref 4.22–5.81)
RDW: 12.6 % (ref 11.5–15.5)
WBC Count: 4.6 K/uL (ref 4.0–10.5)
nRBC: 0 % (ref 0.0–0.2)

## 2024-07-21 LAB — CMP (CANCER CENTER ONLY)
ALT: 8 U/L (ref 0–44)
AST: 20 U/L (ref 15–41)
Albumin: 4.1 g/dL (ref 3.5–5.0)
Alkaline Phosphatase: 68 U/L (ref 38–126)
Anion gap: 9 (ref 5–15)
BUN: 13 mg/dL (ref 6–20)
CO2: 28 mmol/L (ref 22–32)
Calcium: 9.9 mg/dL (ref 8.9–10.3)
Chloride: 104 mmol/L (ref 98–111)
Creatinine: 1.14 mg/dL (ref 0.61–1.24)
GFR, Estimated: >60 mL/min
Glucose, Bld: 188 mg/dL — ABNORMAL HIGH (ref 70–99)
Potassium: 3.9 mmol/L (ref 3.5–5.1)
Sodium: 141 mmol/L (ref 135–145)
Total Bilirubin: 0.4 mg/dL (ref 0.0–1.2)
Total Protein: 6.5 g/dL (ref 6.5–8.1)

## 2024-07-21 LAB — TSH: TSH: 1.31 u[IU]/mL (ref 0.350–4.500)

## 2024-07-21 MED ORDER — HEPARIN SOD (PORK) LOCK FLUSH 100 UNIT/ML IV SOLN
500.0000 [IU] | Freq: Once | INTRAVENOUS | Status: AC
  Administered 2024-07-21: 500 [IU] via INTRAVENOUS

## 2024-07-21 MED ORDER — IOHEXOL 300 MG/ML  SOLN
100.0000 mL | Freq: Once | INTRAMUSCULAR | Status: AC | PRN
  Administered 2024-07-21: 100 mL via INTRAVENOUS

## 2024-07-22 LAB — T4: T4, Total: 6.8 ug/dL (ref 4.5–12.0)

## 2024-07-25 ENCOUNTER — Encounter: Payer: Self-pay | Admitting: Oncology

## 2024-07-27 ENCOUNTER — Encounter: Payer: Self-pay | Admitting: Oncology

## 2024-07-28 ENCOUNTER — Encounter: Payer: Self-pay | Admitting: Oncology

## 2024-07-28 ENCOUNTER — Other Ambulatory Visit: Payer: Self-pay | Admitting: Oncology

## 2024-07-28 ENCOUNTER — Inpatient Hospital Stay

## 2024-07-28 ENCOUNTER — Inpatient Hospital Stay: Admitting: Oncology

## 2024-07-28 ENCOUNTER — Telehealth: Payer: Self-pay | Admitting: Oncology

## 2024-07-28 VITALS — BP 111/76 | HR 64 | Temp 98.0°F | Resp 18 | Ht 69.5 in | Wt 167.9 lb

## 2024-07-28 DIAGNOSIS — C439 Malignant melanoma of skin, unspecified: Secondary | ICD-10-CM | POA: Diagnosis not present

## 2024-07-28 DIAGNOSIS — Z86711 Personal history of pulmonary embolism: Secondary | ICD-10-CM

## 2024-07-28 DIAGNOSIS — C7931 Secondary malignant neoplasm of brain: Secondary | ICD-10-CM

## 2024-07-28 DIAGNOSIS — C787 Secondary malignant neoplasm of liver and intrahepatic bile duct: Secondary | ICD-10-CM

## 2024-07-28 DIAGNOSIS — M25552 Pain in left hip: Secondary | ICD-10-CM

## 2024-07-28 DIAGNOSIS — C7800 Secondary malignant neoplasm of unspecified lung: Secondary | ICD-10-CM | POA: Diagnosis not present

## 2024-07-28 DIAGNOSIS — G629 Polyneuropathy, unspecified: Secondary | ICD-10-CM

## 2024-07-28 DIAGNOSIS — M5126 Other intervertebral disc displacement, lumbar region: Secondary | ICD-10-CM

## 2024-07-28 DIAGNOSIS — M79604 Pain in right leg: Secondary | ICD-10-CM

## 2024-07-28 MED ORDER — HYDROCODONE-ACETAMINOPHEN 5-325 MG PO TABS: 1.0000 | ORAL_TABLET | ORAL | 0 refills | Status: AC | PRN

## 2024-07-28 MED ORDER — SODIUM CHLORIDE 0.9 % IV SOLN
480.0000 mg | Freq: Once | INTRAVENOUS | Status: AC
  Administered 2024-07-28: 480 mg via INTRAVENOUS
  Filled 2024-07-28: qty 48

## 2024-07-28 MED ORDER — RIVAROXABAN 10 MG PO TABS: 10.0000 mg | ORAL_TABLET | Freq: Every day | ORAL | 5 refills | Status: AC

## 2024-07-28 MED ORDER — SODIUM CHLORIDE 0.9 % IV SOLN: Freq: Once | INTRAVENOUS | Status: AC

## 2024-07-28 NOTE — Patient Instructions (Signed)
 Nivolumab Injection What is this medication? NIVOLUMAB (nye VOL ue mab) treats some types of cancer. It works by helping your immune system slow or stop the spread of cancer cells. It is a monoclonal antibody. This medicine may be used for other purposes; ask your health care provider or pharmacist if you have questions. COMMON BRAND NAME(S): Opdivo What should I tell my care team before I take this medication? They need to know if you have any of these conditions: Allogeneic stem cell transplant (uses someone else's stem cells) Autoimmune diseases, such as Crohn disease, ulcerative colitis, lupus History of chest radiation Nervous system problems, such as Guillain-Barre syndrome or myasthenia gravis Organ transplant An unusual or allergic reaction to nivolumab, other medications, foods, dyes, or preservatives Pregnant or trying to get pregnant Breast-feeding How should I use this medication? This medication is infused into a vein. It is given in a hospital or clinic setting. A special MedGuide will be given to you before each treatment. Be sure to read this information carefully each time. Talk to your care team about the use of this medication in children. While it may be prescribed for children as young as 12 years for selected conditions, precautions do apply. Overdosage: If you think you have taken too much of this medicine contact a poison control center or emergency room at once. NOTE: This medicine is only for you. Do not share this medicine with others. What if I miss a dose? Keep appointments for follow-up doses. It is important not to miss your dose. Call your care team if you are unable to keep an appointment. What may interact with this medication? Interactions have not been studied. This list may not describe all possible interactions. Give your health care provider a list of all the medicines, herbs, non-prescription drugs, or dietary supplements you use. Also tell them if you  smoke, drink alcohol, or use illegal drugs. Some items may interact with your medicine. What should I watch for while using this medication? Your condition will be monitored carefully while you are receiving this medication. You may need blood work while taking this medication. This medication may cause serious skin reactions. They can happen weeks to months after starting the medication. Contact your care team right away if you notice fevers or flu-like symptoms with a rash. The rash may be red or purple and then turn into blisters or peeling of the skin. You may also notice a red rash with swelling of the face, lips, or lymph nodes in your neck or under your arms. Tell your care team right away if you have any change in your eyesight. Talk to your care team if you are pregnant or think you might be pregnant. A negative pregnancy test is required before starting this medication. A reliable form of contraception is recommended while taking this medication and for 5 months after the last dose. Talk to your care team about effective forms of contraception. Do not breast-feed while taking this medication and for 5 months after the last dose. What side effects may I notice from receiving this medication? Side effects that you should report to your care team as soon as possible: Allergic reactions--skin rash, itching, hives, swelling of the face, lips, tongue, or throat Dry cough, shortness of breath or trouble breathing Eye pain, redness, irritation, or discharge with blurry or decreased vision Heart muscle inflammation--unusual weakness or fatigue, shortness of breath, chest pain, fast or irregular heartbeat, dizziness, swelling of the ankles, feet, or hands Hormone  gland problems--headache, sensitivity to light, unusual weakness or fatigue, dizziness, fast or irregular heartbeat, increased sensitivity to cold or heat, excessive sweating, constipation, hair loss, increased thirst or amount of urine,  tremors or shaking, irritability Infusion reactions--chest pain, shortness of breath or trouble breathing, feeling faint or lightheaded Kidney injury (glomerulonephritis)--decrease in the amount of urine, red or dark brown urine, foamy or bubbly urine, swelling of the ankles, hands, or feet Liver injury--right upper belly pain, loss of appetite, nausea, light-colored stool, dark yellow or brown urine, yellowing skin or eyes, unusual weakness or fatigue Pain, tingling, or numbness in the hands or feet, muscle weakness, change in vision, confusion or trouble speaking, loss of balance or coordination, trouble walking, seizures Rash, fever, and swollen lymph nodes Redness, blistering, peeling, or loosening of the skin, including inside the mouth Sudden or severe stomach pain, bloody diarrhea, fever, nausea, vomiting Side effects that usually do not require medical attention (report these to your care team if they continue or are bothersome): Bone, joint, or muscle pain Diarrhea Fatigue Loss of appetite Nausea Skin rash This list may not describe all possible side effects. Call your doctor for medical advice about side effects. You may report side effects to FDA at 1-800-FDA-1088. Where should I keep my medication? This medication is given in a hospital or clinic. It will not be stored at home. NOTE: This sheet is a summary. It may not cover all possible information. If you have questions about this medicine, talk to your doctor, pharmacist, or health care provider.  2024 Elsevier/Gold Standard (2021-10-21 00:00:00)

## 2024-07-28 NOTE — Progress Notes (Signed)
 " [facility]  [address] [place],  [place]  [address] [phone]  Clinic Day:  07/28/2024  Referring physician: [name], NP  ASSESSMENT & PLAN:  Assessment: Malignant melanoma metastatic to lung [facility]) Metastatic melanoma to lung diagnosed in found on routine CT imaging in May 2023.  There was over 20 bilateral pulmonary nodules which increased in size by the time treatment was started. The largest 1 nodule is up to 2.3 cm in diameter.  There were also 4 subcutaneous nodules of the back.  Soft tissue needle core biopsy of a right back subcutaneous nodule revealed metastatic malignant melanoma.  He was initially treated with 4 cycles of ipilimumab /nivolumab  with a good response, then placed on maintenance nivolumab .    He continues nivolumab  every 4 weeks.  Most recent CT imaging in July 2025 revealed a stable subpleural nodule of the left lower lobe, otherwise no metastatic disease was seen.  He is doing well at this time, so we will proceed with nivolumab  today.  We will plan to see him back in 4 weeks with a CBC, comprehensive metabolic panel, TSH and CT chest, abdomen and pelvis to reassess his disease baseline prior to his next cycle of nivolumab .  Malignant melanoma metastatic to brain [facility]) Brain metastasis diagnosed in June 2023.  PET scan was concerning for possible lesion in the left frontoparietal lobe.  MRI of the brain confirmed a 2.6 cm heterogeneous lesion of the left superior temporal gyrus with mild enhancement and extensive surrounding edema.  He also has an 8 mm cortical lesion of the inferomedial right frontal lobe with mild edema, a 2 mm mildly enhancing cortical lesion of the inferolateral left frontal lobe and a intrinsically T1 hyperintense lesion of the left lateral pons measuring 7 mm with surrounding edema. He was treated with preoperative stereotactic radiation.  He then underwent surgical resection of the larger lesion left temporal lobe mass in  June.   He had recurrent neurologic symptoms with left-sided facial droop and left-sided weakness in September 2023 and was placed on dexamethasone  briefly.  MRI brain was stable.  He began seeing Xavier Jones and as his left facial weakness had nearly resolved, he tapered him off dexamethasone .  He has continued to follow with Xavier Jones and radiation therapy.  Surveillance MRIs have been basically stable.  The patient later developed recurrent left facial weakness that has become permanent.  He also has had chronic lightheadedness, hearing loss, and tinnitus that has been going on since radiation to the head, which worsens with loud noise, and he works in a loud environment. He has seen a ENT who attributed this to radiation.  The patient is no longer working and denies progressive symptoms.  He is scheduled to see Xavier Jones again in April with repeat MRI brain.  [name], Breslow's depth 3.3 mm, Clark level IV, treated with wide local excision, diagnosed in May 2021.  There was still a question as to whether this represented a metastatic lesion, rather than a primary, based on the presence of 2 nodules and no epidermal involvement.  MRI head and CT chest, abdomen and pelvis in June 2021 were negative for metastatic disease.   No adjuvant therapy was recommended.  PET scan in September 2021 was negative for metastatic disease.    Unfortunately, he was found to have widespread metastatic disease, including to the brain, in June 2023.  He was initially  treated with ipilimumab /nivolumab  for 4 cycles then placed on maintenance nivolumab  which he continues every 4 weeks.  History of pulmonary embolism History of small pulmonary embolism in September 2023.  He continues on Xarelto  20 mg daily, but I will decrease this to 10 mg since this is a prophylactic dose as he continues to be at increased risk for recurrent pulmonary  emboli.   Plan: He complains of right foot pain rated 4/10. He is concerned about neuropathy in his left hand and right leg, especially in cold weather. I explained that his treatment can cause neuropathy. He had a CT chest, abdomen, pelvis performed on 07/21/2024 which revealed stable examination without evidence of new or progressive disease in the chest, abdomen or pelvis, tiny pulmonary nodules in the left lung base, stable from prior, and aortic atherosclerosis. He completed labs on 07/21/2024. He has a WBC of 4.6, a low hemoglobin of 12.7 improved from 12.3, and platelet count of 261,000.  His CMP was normal other than an elevated blood glucose of 188. His TSH is 1.310 and his T4 is 6.8. He continues on Xarelto  20 mg daily, but I will decrease this to 10 mg since this is a prophylactic dose as he continues to be at increased risk for recurrent pulmonary emboli. He is scheduled for a Brain MRI on 10/27/2024. I will see him back in 4 weeks with CBC, CMP, TSH, and T4. The patient understands the plans discussed today and is in agreement with them.  He knows to contact our office if he develops concerns prior to his next appointment.  I provided 15 minutes of face-to-face time during this encounter and > 50% was spent counseling as documented under my assessment and plan.   Xavier VEAR Cornish, MD  [facility] A DEPT OF [facility] [address] [place] [address] Dept: [phone] Dept Fax: [phone]    No orders of the defined types were placed in this encounter.   CHIEF COMPLAINT:  CC: Metastatic melanoma  Current Treatment: Nivolumab  every 4 weeks  HISTORY OF PRESENT ILLNESS:  Xavier Jones is a 54 y.o. male with a history of stage IIB (T3b N0 M0) malignant melanoma of the right posterior shoulder diagnosed in May 2021. He reported an enlarging nevus of the right shoulder for about a year.  It began to bleed, so he saw  his dermatologist.  Biopsy revealed malignant melanoma, Breslow's depth 3.3 mm, Clark level IV, however, there was no overt attachment to the overlying epidermis, so a metastatic nodule could not be ruled out.  He had several other lesions removed at the time of biopsy.  Right lower lateral back shave revealed a dysplastic compound nevus with severe atypia, limited margins free.  Right lower medial back shave revealed dysplastic nevus with moderate to severe atypia, peripheral margin involved.  Left mid buttocks shave revealed dysplastic compound nevus with moderate atypia, limited margins free.  MRI head and CT chest, abdomen and pelvis in June were negative for metastatic disease. He underwent wide local excision of the right shoulder melanoma with Dr. Allana in June.  Pathology revealed residual dermal malignant melanoma, but margins were clear.  3 sentinel lymph nodes were negative for metastasis (0/3).  However, there was a question as to whether this represents a metastatic lesion, rather than a primary, based on the presence of 2 nodules and no epidermal involvement.  He also had excision of the lower back  lesion, which did not reveal malignancy. PET scan in September 2021 did not reveal any findings of hypermetabolic metastatic disease, there was low level activity felt to be postoperative changes in the skin/subcutaneous tissue of the right posterior shoulder, as well as likely degenerative hypermetabolism of the proximal, posterior right femur.  No adjuvant therapy was recommended.   Due to his personal and family history of malignancy, he was seen by our genetic counselor to discuss testing for hereditary cancer syndromes. [facility] Multi-Cancer +RNA Panel in August 2022 revealed variants of uncertain significance (VUS) of the APC, CDKN1C and FH gene.   Unfortunately, he developed metastatic disease in June 2023.  He had palpable subcutaneous nodules.  CT chest revealed 4 subcutaneous nodules in the  back worrisome for metastatic disease. At least 20 pulmonary nodules were identified, involving all lobes of both lungs consistent with metastatic disease.  Biopsy of a subcutaneous nodule was positive for metastatic melanoma.  MRI head revealed four metastatic lesions as described with the largest within the left temporal lobe.  He saw radiation oncology and underwent preoperative stereotactic radiosurgery.  He was referred to Dr. Lanis and underwent craniotomy with resection of the left temporal lobe mass in June.   He was then placed on palliative immunotherapy.  He received 4 cycles of nivolumab /ipilimumab  July to October 2023.  He had recurrent neurologic symptoms with left facial weakness in September 2023 and MRI showed left temporal resection cavity demonstrated thin linear enhancement along the resection margins, which may be postoperative, with additional small areas of somewhat  more nodular enhancement, which are nonspecific but could indicate recurrent disease.  Attention on follow-up was recommended.  Interval increase in the size of the right inferior frontal gyrus enhancing lesion with increased surrounding edema, mild local mass effect, and mild local midline shift. Stable to slightly decreased size of the left pontine lesion with decreased associated edema. Previously noted left inferior frontal gyrus lesion was no longer visualized.  No definite new lesion.  He was placed on dexamethasone  briefly and referred to Xavier Jones for his recommendations.  He had near resolution of his left facial weakness, so Xavier Jones had him taper off his dexamethasone .  He was also found to have a small left upper lobe pulmonary embolism when he developed increased cough and shortness of breath in September 2023, for which he continues rivaroxaban .  We recommended he continue anticoagulation indefinitely due to his increased risk of thrombosis from malignancy.  There was also bronchial wall thickening, areas  of patchy basilar opacity and new ground glass opacity of the upper lobes consistent with infection versus inflammation due to immunotherapy.  He otherwise had a response to therapy in the bilateral metastatic pulmonary lesions.  He was treated with antibiotics in addition to dexamethasone  and rivaroxaban .  He was placed on maintenance nivolumab  in November 2023 every 4 weeks with a continued response.  His subcutaneous metastasis resolved with treatment.  He has continued to follow with radiation oncology and Xavier Jones.  He later developed permanent left facial weakness, although surveillance MRIs brain have been stable. Most recent MRI brain in October revealed stable treated brain metastases in the left pons, left cerebellum, and right frontal lobe. Stable appearance of lateral left temporal lobe resection site.  No evidence of new intracranial lesion or other acute abnormality.  Surveillance CTs chest, abdomen and pelvis have remained stable. Most recent CT chest, abdomen and pelvis in July 2025 revealed a small left lower lobe pulmonary nodule which was  stable without evidence of progressive or new disease.  Oncology History  Melanoma of skin (HCC)  12/01/2019 Initial Diagnosis   Melanoma of skin (HCC)   12/05/2019 Cancer Staging   Staging form: Melanoma of the Skin, AJCC 8th Edition - Clinical stage from 12/05/2019: Stage IIA (cT3a, cN0, cM0) - Signed by Cornelius Xavier DEL, MD on 05/30/2020   02/18/2021 Genetic Testing   No pathogenic variants detected in [facility] Multi-Cancer +RNA Panel.  Variants of uncertain significance detected in APC (c.681C>G (p.Asp227Glu)), CDKN1C (c.610C>G (p.Pro204Ala)), and FH (c.1151C>T (p.Ala384Val)).  The report date is February 18, 2021.    The Multi-Cancer + RNA Panel offered by [facility] includes sequencing and/or deletion/duplication analysis of the following 84 genes:  AIP*, ALK, APC*, ATM*, AXIN2*, BAP1*, BARD1*, BLM*, BMPR1A*, BRCA1*, BRCA2*, BRIP1*, CASR,  CDC73*, CDH1*, CDK4, CDKN1B*, CDKN1C*, CDKN2A, CEBPA, CHEK2*, CTNNA1*, DICER1*, DIS3L2*, EGFR, EPCAM, FH*, FLCN*, GATA2*, GPC3, GREM1, HOXB13, HRAS, KIT, MAX*, MEN1*, MET, MITF, MLH1*, MSH2*, MSH3*, MSH6*, MUTYH*, NBN*, NF1*, NF2*, NTHL1*, PALB2*, PDGFRA, PHOX2B, PMS2*, POLD1*, POLE*, POT1*, PRKAR1A*, PTCH1*, PTEN*, RAD50*, RAD51C*, RAD51D*, RB1*, RECQL4, RET, RUNX1*, SDHA*, SDHAF2*, SDHB*, SDHC*, SDHD*, SMAD4*, SMARCA4*, SMARCB1*, SMARCE1*, STK11*, SUFU*, TERC, TERT, TMEM127*, Tp53*, TSC1*, TSC2*, VHL*, WRN*, and WT1.  RNA analysis is performed for * genes.   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 (HCC)   01/30/2022 - 02/20/2022 Chemotherapy   Patient is on Treatment Plan : MELANOMA Nivolumab  + Ipilimumab  (1/3) q21d / Nivolumab  q28d     01/30/2022 -  Chemotherapy   Patient is on Treatment Plan : MELANOMA Nivolumab  (480) q28d     Malignant melanoma metastatic to lung (HCC)  12/19/2021 Initial Diagnosis   Malignant melanoma metastatic to lung (HCC)   01/30/2022 - 02/20/2022 Chemotherapy   Patient is on Treatment Plan : MELANOMA Nivolumab  + Ipilimumab  (1/3) q21d / Nivolumab  q28d     01/30/2022 -  Chemotherapy   Patient is on Treatment Plan : MELANOMA Nivolumab  (480) q28d     03/24/2022 Imaging   CTA chest:  IMPRESSION:  1. Small nonocclusive segmental level pulmonary embolism in the  inferior LEFT upper lobe.  2. Marked bronchial wall thickening with areas of patchy basilar  opacities and new ground-glass opacities in the upper lobes.  Constellation of findings may be infectious bronchiolitis/bronchitis  though the possibility of drug related changes in the setting of  immunotherapy are also  considered. Furthermore, given basilar  predominance and some material in basilar bronchial structures would  correlate with any risk factors for or history of aspiration.  3. Response to therapy of bilateral metastatic lesions. Some lymph  nodes in the chest with interval increase in size are equivocal at  this time, potentially reactive, but warrant attention on subsequent  imaging.    07/22/2022 Imaging   CT chest, abdomen and pelvis:  IMPRESSION:  1. Interval positive response to therapy. Resolved subcarinal and  bilateral hilar lymphadenopathy. Stable to decreased left pulmonary  nodules. No new or progressive metastatic disease in the chest,  abdomen or pelvis.  2. One vessel coronary atherosclerosis.  3. Aortic Atherosclerosis (ICD10-I70.0).    Malignant neoplasm metastatic to liver (HCC)  12/19/2021 Initial Diagnosis   Malignant  neoplasm metastatic to liver [facility])   01/30/2022 - 02/20/2022 Chemotherapy   Patient is on Treatment Plan : MELANOMA Nivolumab  + Ipilimumab  (1/3) q21d / Nivolumab  q28d     01/30/2022 -  Chemotherapy   Patient is on Treatment Plan : MELANOMA Nivolumab  (480) q28d     07/22/2022 Imaging   CT chest, abdomen and pelvis:  IMPRESSION:  1. Interval positive response to therapy. Resolved subcarinal and  bilateral hilar lymphadenopathy. Stable to decreased left pulmonary  nodules. No new or progressive metastatic disease in the chest,  abdomen or pelvis.  2. One vessel coronary atherosclerosis.  3. Aortic Atherosclerosis (ICD10-I70.0).        INTERVAL HISTORY:  Xavier Jones is here today for repeat clinical assessment of his metastatic malignant melanoma to liver, lung, skin, and brain prior to his day 1, cycle 24 of nivolumab . Patient states that he feels well, but complains of right foot pain rated 4/10. He is concerned about neuropathy in his left hand and right leg, especially in cold weather. I explained that his treatment can cause neuropathy. He had a CT  chest, abdomen, pelvis performed on 07/21/2024 which revealed stable examination without evidence of new or progressive disease in the chest, abdomen or pelvis, tiny pulmonary nodules in the left lung base, stable from prior, and aortic atherosclerosis. He completed labs on 07/21/2024. He has a WBC of 4.6, a low hemoglobin of 12.7 improved from 12.3, and platelet count of 261,000.  His CMP was normal other than an elevated blood glucose of 188. His TSH is 1.310 and his T4 is 6.8. He continues on Xarelto  20 mg daily, but I will decrease this to 10 mg since this is a prophylactic dose as he continues to be at increased risk for recurrent pulmonary emboli. He is scheduled for a Brain MRI on 10/27/2024. I will see him back in 4 weeks with CBC, CMP, TSH, and T4.  He denies fever, chills, night sweats, or other signs of infection. He denies cardiorespiratory and gastrointestinal issues. His appetite is good and His weight has decreased 1 pound in 1 month.   REVIEW OF SYSTEMS:   Review of Systems  Constitutional:  Positive for fatigue (mild). Negative for appetite change, chills, diaphoresis, fever and unexpected weight change.  HENT:   Positive for tinnitus (right ear, improving). Negative for hearing loss, lump/mass, mouth sores, sore throat and trouble swallowing.   Eyes:  Positive for eye problems (dryness left eye).  Respiratory:  Negative for cough and shortness of breath.   Cardiovascular:  Negative for chest pain and leg swelling.  Gastrointestinal:  Negative for abdominal pain, constipation, diarrhea, nausea and vomiting.  Genitourinary:  Negative for difficulty urinating, dysuria, frequency and hematuria.   Musculoskeletal:  Positive for arthralgias (right foot). Negative for back pain, gait problem and myalgias.       Right foot pain 4/10  Skin:  Negative for itching, rash and wound.  Neurological:  Positive for numbness (left face, left hand, right leg). Negative for dizziness, extremity  weakness, gait problem, headaches, light-headedness and seizures.  Hematological:  Negative for adenopathy.  Psychiatric/Behavioral:  Negative for depression and sleep disturbance. The patient is not nervous/anxious.     VITALS:   Blood pressure 111/76, pulse 64, temperature 98 F (36.7 C), temperature source Oral, resp. rate 18, height 5' 9.5 (1.765 m), weight 167 lb 14.4 oz (76.2 kg), SpO2 100%.  Wt Readings from Last 3 Encounters:  07/28/24 167 lb 14.4 oz (76.2 kg)  06/28/24 168  lb 9.6 oz (76.5 kg)  06/01/24 169 lb 9.6 oz (76.9 kg)    Body mass index is 24.44 kg/m.  Performance status (ECOG): 1 - Symptomatic but completely ambulatory  PHYSICAL EXAM:   Physical Exam Vitals and nursing note reviewed.  Constitutional:      General: He is not in acute distress.    Appearance: Normal appearance. He is normal weight.  HENT:     Head: Normocephalic and atraumatic.     Mouth/Throat:     Mouth: Mucous membranes are moist.     Pharynx: Oropharynx is clear. No oropharyngeal exudate or posterior oropharyngeal erythema.  Eyes:     General: No scleral icterus.    Extraocular Movements: Extraocular movements intact.     Conjunctiva/sclera: Conjunctivae normal.     Pupils: Pupils are equal, round, and reactive to light.  Cardiovascular:     Rate and Rhythm: Normal rate and regular rhythm.     Heart sounds: Normal heart sounds. No murmur heard.    No friction rub. No gallop.  Pulmonary:     Effort: Pulmonary effort is normal.     Breath sounds: Normal breath sounds. No wheezing, rhonchi or rales.  Abdominal:     General: Bowel sounds are normal. There is no distension.     Palpations: Abdomen is soft. There is no hepatomegaly, splenomegaly or mass.     Tenderness: There is no abdominal tenderness.  Musculoskeletal:        General: Normal range of motion.     Cervical back: Normal range of motion and neck supple. No tenderness.     Right lower leg: No edema.     Left lower leg: No  edema.  Lymphadenopathy:     Cervical: No cervical adenopathy.     Upper Body:     Right upper body: No supraclavicular or axillary adenopathy.     Left upper body: No supraclavicular or axillary adenopathy.     Lower Body: No right inguinal adenopathy. No left inguinal adenopathy.  Skin:    General: Skin is warm and dry.     Coloration: Skin is not jaundiced.     Findings: No rash.  Neurological:     Mental Status: He is alert and oriented to person, place, and time.     Cranial Nerves: Facial asymmetry (left facial weakness) present.     Sensory: Sensory deficit (decreased sensation left face) present.  Psychiatric:        Mood and Affect: Mood normal.        Behavior: Behavior normal.        Thought Content: Thought content normal.    LABS:      Latest Ref Rng & Units 07/21/2024    1:46 PM 06/28/2024    9:22 AM 06/01/2024    9:23 AM  CBC  WBC 4.0 - 10.5 K/uL 4.6  4.2  4.0   Hemoglobin 13.0 - 17.0 g/dL 87.2  87.6  87.6   Hematocrit 39.0 - 52.0 % 37.6  36.5  36.7   Platelets 150 - 400 K/uL 261  253  231       Latest Ref Rng & Units 07/21/2024    1:46 PM 06/28/2024    9:22 AM 06/01/2024    9:23 AM  CMP  Glucose 70 - 99 mg/dL 811  864  827   BUN 6 - 20 mg/dL 13  16  15    Creatinine 0.61 - 1.24 mg/dL 8.85  9.07  9.08  Sodium 135 - 145 mmol/L 141  139  141   Potassium 3.5 - 5.1 mmol/L 3.9  4.3  4.2   Chloride 98 - 111 mmol/L 104  103  104   CO2 22 - 32 mmol/L 28  28  27    Calcium 8.9 - 10.3 mg/dL 9.9  9.4  9.4   Total Protein 6.5 - 8.1 g/dL 6.5  6.5  6.2   Total Bilirubin 0.0 - 1.2 mg/dL 0.4  0.4  0.3   Alkaline Phos 38 - 126 U/L 68  62  58   AST 15 - 41 U/L 20  20  21    ALT 0 - 44 U/L 8  7  13       No results found for: CEA1, CEA / No results found for: CEA1, CEA No results found for: PSA No results found for: CAN199 No results found for: RJW874  Lab Results  Component Value Date   TOTALPROTELP 6.7 08/27/2023   TOTALPROTELP 6.7 08/27/2023    ALBUMINELP 3.9 08/27/2023   A1GS 0.2 08/27/2023   A2GS 0.7 08/27/2023   BETS 1.0 08/27/2023   GAMS 0.8 08/27/2023   MSPIKE Not Observed 08/27/2023   SPEI Comment 08/27/2023   Lab Results  Component Value Date   TIBC 382 06/02/2023   TIBC 297 05/21/2022   FERRITIN 22 (L) 06/02/2023   FERRITIN 41 05/21/2022   IRONPCTSAT 23 06/02/2023   IRONPCTSAT 18 05/21/2022   Lab Results  Component Value Date   LDH 139 05/21/2022   LDH 145 12/19/2021    STUDIES:  EXAM: 07/21/2024 CT CHEST, ABDOMEN, AND PELVIS WITH CONTRAST IMPRESSION: 1. Stable examination without evidence of new or progressive disease in the chest, abdomen or pelvis. 2. Tiny pulmonary nodules in the left lung base are stable from prior. 3. Aortic atherosclerosis.  HISTORY:   Past Medical History:  Diagnosis Date   Acute pulmonary embolism without acute cor pulmonale (HCC) 05/01/2022   Anemia 05/20/2022   Chemotherapy follow-up examination 04/21/2023   Degenerative disc disease, cervical 08/27/2023   Depression    mild   Family history of breast cancer 01/22/2021   Family history of prostate cancer 01/22/2021   Genetic testing 02/22/2021   No pathogenic variants detected in [facility] Multi-Cancer +RNA Panel.  Variants of uncertain significance detected in APC (c.681C>G (p.Asp227Glu)), CDKN1C (c.610C>G (p.Pro204Ala)), and FH (c.1151C>T (p.Ala384Val)).  The report date is February 18, 2021.       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) 12/19/2021   Melanoma of skin (HCC) 12/01/2019   Multiple dysplastic nevi 05/30/2020   Peripheral neuropathy 05/09/2023   Port-A-Cath in place 07/28/2022   Pruritic rash 02/18/2022   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 OR;  Service: Neurosurgery;  Laterality: Left;   MELANOMA EXCISION WITH SENTINEL LYMPH NODE BIOPSY Right 2021   SEPTOPLASTY  05/13/2023   Dr Honor   SPINAL FUSION W/ LUQUE UNIT ROD  2003    Family History  Problem Relation Age of Onset   Prostate cancer Father        dx 17s   Breast cancer Maternal Aunt        dx after 32; bilateral mastectomy   Stomach cancer Maternal Grandfather        dx 25s   Breast cancer Cousin 38       maternal male cousin; mets    Social History:  reports that he quit smoking about 28 years ago. His smoking use included cigarettes. He has never used smokeless tobacco. He reports current alcohol use of about 14.0 standard drinks of alcohol per week. He reports that he does not currently use drugs.The patient is alone today.  Allergies: Allergies[1]  Current Medications: Current Outpatient Medications  Medication Sig Dispense Refill   acebutolol  (SECTRAL ) 200 MG capsule TAKE ONE CAPSULE BY MOUTH EVERY DAY 90 capsule 3   acyclovir  (ZOVIRAX ) 400 MG tablet Take 400 mg by mouth 3 (three) times daily.     amitriptyline  (ELAVIL ) 25 MG tablet Take 50 mg by mouth at bedtime.     ergocalciferol (VITAMIN D2) 1.25 MG (50000 UT)  capsule Take 50,000 Units by mouth every Wednesday.     folic acid  (FOLVITE ) 1 MG tablet TAKE ONE TABLET BY MOUTH DAILY 30 tablet 5   HYDROcodone -acetaminophen  (NORCO/VICODIN) 5-325 MG tablet Take 1 tablet by mouth every 4 (four) hours as needed for moderate pain (pain score 4-6). 150 tablet 0   hydrOXYzine  (ATARAX ) 25 MG tablet TAKE ONE TABLET BY MOUTH 3 TIMES A DAY AS NEEDED FOR ITCHING 60 tablet 3   lidocaine -prilocaine  (EMLA ) cream Apply 1 Application topically as needed. 30 g 0   lisinopril  (ZESTRIL ) 20 MG tablet TAKE ONE TABLET BY MOUTH EVERY DAY 30 tablet 1   pregabalin  (LYRICA ) 100 MG capsule Take 1 capsule (100 mg total) by mouth 3 (three) times daily. 90 capsule 5   rivaroxaban  (XARELTO ) 10 MG TABS tablet Take 1 tablet (10 mg total) by mouth daily. 30 tablet 5   No current facility-administered medications for this visit.   LILLETTE Aretta Cook, acting as a scribe for Xavier VEAR Cornish, MD, have documented all relevant documentation on the behalf of Xavier VEAR Cornish, MD, as directed by Xavier VEAR Cornish, MD, while in the presence of Xavier VEAR Cornish, MD.  I have reviewed this report as typed by the medical scribe, and it is complete and accurate.     [1]  Allergies Allergen Reactions   Sulfa Antibiotics Rash   Testosterone  Itching    Gel   "

## 2024-07-28 NOTE — Telephone Encounter (Signed)
 Patient has been scheduled for follow-up visit per 07/27/2024 LOS.  Pt given an appt calendar with date and time.

## 2024-08-04 ENCOUNTER — Encounter: Payer: Self-pay | Admitting: Oncology

## 2024-08-08 ENCOUNTER — Encounter: Payer: Self-pay | Admitting: Oncology

## 2024-08-09 ENCOUNTER — Other Ambulatory Visit: Payer: Self-pay | Admitting: Hematology and Oncology

## 2024-08-09 DIAGNOSIS — G588 Other specified mononeuropathies: Secondary | ICD-10-CM

## 2024-08-11 ENCOUNTER — Encounter: Payer: Self-pay | Admitting: Oncology

## 2024-08-12 ENCOUNTER — Encounter: Payer: Self-pay | Admitting: Oncology

## 2024-08-25 ENCOUNTER — Inpatient Hospital Stay

## 2024-08-25 ENCOUNTER — Inpatient Hospital Stay: Attending: Hematology and Oncology | Admitting: Oncology

## 2024-10-27 ENCOUNTER — Other Ambulatory Visit

## 2024-10-31 ENCOUNTER — Inpatient Hospital Stay

## 2024-11-03 ENCOUNTER — Inpatient Hospital Stay: Admitting: Internal Medicine
